# Patient Record
Sex: Male | Born: 1945 | Race: White | Hispanic: No | Marital: Married | Smoking: Former smoker
Health system: Southern US, Community
[De-identification: ages and names within clinical notes are randomized; demographics above are authoritative.]

## PROBLEM LIST (undated history)

## (undated) DIAGNOSIS — K7460 Unspecified cirrhosis of liver: Secondary | ICD-10-CM

## (undated) DIAGNOSIS — I82409 Acute embolism and thrombosis of unspecified deep veins of unspecified lower extremity: Secondary | ICD-10-CM

## (undated) DIAGNOSIS — S065X9A Traumatic subdural hemorrhage with loss of consciousness of unspecified duration, initial encounter: Secondary | ICD-10-CM

## (undated) DIAGNOSIS — I609 Nontraumatic subarachnoid hemorrhage, unspecified: Secondary | ICD-10-CM

## (undated) DIAGNOSIS — I472 Ventricular tachycardia: Secondary | ICD-10-CM

## (undated) DIAGNOSIS — I10 Essential (primary) hypertension: Secondary | ICD-10-CM

## (undated) DIAGNOSIS — Z720 Tobacco use: Secondary | ICD-10-CM

## (undated) DIAGNOSIS — S069X9A Unspecified intracranial injury with loss of consciousness of unspecified duration, initial encounter: Secondary | ICD-10-CM

## (undated) DIAGNOSIS — I739 Peripheral vascular disease, unspecified: Secondary | ICD-10-CM

## (undated) DIAGNOSIS — I872 Venous insufficiency (chronic) (peripheral): Secondary | ICD-10-CM

## (undated) DIAGNOSIS — E119 Type 2 diabetes mellitus without complications: Secondary | ICD-10-CM

## (undated) HISTORY — DX: Essential (primary) hypertension: I10

## (undated) HISTORY — PX: TONSILLECTOMY: SUR1361

## (undated) HISTORY — PX: CYST REMOVAL TRUNK: SHX6283

---

## 2013-12-24 ENCOUNTER — Emergency Department (HOSPITAL_COMMUNITY): Payer: Medicare Other

## 2013-12-24 ENCOUNTER — Encounter (HOSPITAL_COMMUNITY): Payer: Self-pay | Admitting: Emergency Medicine

## 2013-12-24 ENCOUNTER — Inpatient Hospital Stay (HOSPITAL_COMMUNITY): Payer: Medicare Other

## 2013-12-24 ENCOUNTER — Inpatient Hospital Stay (HOSPITAL_COMMUNITY)
Admission: EM | Admit: 2013-12-24 | Discharge: 2014-01-08 | DRG: 082 | Disposition: A | Discharge status: Rehabilitation Facility | Payer: Medicare Other | Attending: Internal Medicine | Admitting: Internal Medicine

## 2013-12-24 DIAGNOSIS — I824Z9 Acute embolism and thrombosis of unspecified deep veins of unspecified distal lower extremity: Secondary | ICD-10-CM | POA: Diagnosis not present

## 2013-12-24 DIAGNOSIS — I10 Essential (primary) hypertension: Secondary | ICD-10-CM | POA: Diagnosis present

## 2013-12-24 DIAGNOSIS — S065XAA Traumatic subdural hemorrhage with loss of consciousness status unknown, initial encounter: Secondary | ICD-10-CM

## 2013-12-24 DIAGNOSIS — E870 Hyperosmolality and hypernatremia: Secondary | ICD-10-CM | POA: Diagnosis not present

## 2013-12-24 DIAGNOSIS — S066X9A Traumatic subarachnoid hemorrhage with loss of consciousness of unspecified duration, initial encounter: Secondary | ICD-10-CM | POA: Diagnosis present

## 2013-12-24 DIAGNOSIS — G9341 Metabolic encephalopathy: Secondary | ICD-10-CM | POA: Diagnosis present

## 2013-12-24 DIAGNOSIS — I609 Nontraumatic subarachnoid hemorrhage, unspecified: Secondary | ICD-10-CM

## 2013-12-24 DIAGNOSIS — I498 Other specified cardiac arrhythmias: Secondary | ICD-10-CM | POA: Diagnosis not present

## 2013-12-24 DIAGNOSIS — L02419 Cutaneous abscess of limb, unspecified: Secondary | ICD-10-CM | POA: Diagnosis not present

## 2013-12-24 DIAGNOSIS — W108XXA Fall (on) (from) other stairs and steps, initial encounter: Secondary | ICD-10-CM | POA: Diagnosis present

## 2013-12-24 DIAGNOSIS — B961 Klebsiella pneumoniae [K. pneumoniae] as the cause of diseases classified elsewhere: Secondary | ICD-10-CM | POA: Diagnosis not present

## 2013-12-24 DIAGNOSIS — S069XAA Unspecified intracranial injury with loss of consciousness status unknown, initial encounter: Secondary | ICD-10-CM

## 2013-12-24 DIAGNOSIS — R1310 Dysphagia, unspecified: Secondary | ICD-10-CM | POA: Diagnosis present

## 2013-12-24 DIAGNOSIS — Z6841 Body Mass Index (BMI) 40.0 and over, adult: Secondary | ICD-10-CM

## 2013-12-24 DIAGNOSIS — J9601 Acute respiratory failure with hypoxia: Secondary | ICD-10-CM | POA: Diagnosis not present

## 2013-12-24 DIAGNOSIS — I739 Peripheral vascular disease, unspecified: Secondary | ICD-10-CM | POA: Diagnosis present

## 2013-12-24 DIAGNOSIS — R404 Transient alteration of awareness: Secondary | ICD-10-CM | POA: Diagnosis present

## 2013-12-24 DIAGNOSIS — R4182 Altered mental status, unspecified: Secondary | ICD-10-CM

## 2013-12-24 DIAGNOSIS — S066XAA Traumatic subarachnoid hemorrhage with loss of consciousness status unknown, initial encounter: Secondary | ICD-10-CM

## 2013-12-24 DIAGNOSIS — Z781 Physical restraint status: Secondary | ICD-10-CM | POA: Diagnosis present

## 2013-12-24 DIAGNOSIS — J9602 Acute respiratory failure with hypercapnia: Secondary | ICD-10-CM

## 2013-12-24 DIAGNOSIS — L03119 Cellulitis of unspecified part of limb: Secondary | ICD-10-CM

## 2013-12-24 DIAGNOSIS — R0689 Other abnormalities of breathing: Secondary | ICD-10-CM | POA: Diagnosis present

## 2013-12-24 DIAGNOSIS — S069X9A Unspecified intracranial injury with loss of consciousness of unspecified duration, initial encounter: Secondary | ICD-10-CM

## 2013-12-24 DIAGNOSIS — G9340 Encephalopathy, unspecified: Secondary | ICD-10-CM

## 2013-12-24 DIAGNOSIS — I872 Venous insufficiency (chronic) (peripheral): Secondary | ICD-10-CM | POA: Diagnosis present

## 2013-12-24 DIAGNOSIS — S065X9A Traumatic subdural hemorrhage with loss of consciousness of unspecified duration, initial encounter: Principal | ICD-10-CM | POA: Diagnosis present

## 2013-12-24 DIAGNOSIS — G4730 Sleep apnea, unspecified: Secondary | ICD-10-CM | POA: Diagnosis present

## 2013-12-24 DIAGNOSIS — E876 Hypokalemia: Secondary | ICD-10-CM | POA: Diagnosis present

## 2013-12-24 DIAGNOSIS — N390 Urinary tract infection, site not specified: Secondary | ICD-10-CM | POA: Diagnosis not present

## 2013-12-24 DIAGNOSIS — Z9981 Dependence on supplemental oxygen: Secondary | ICD-10-CM

## 2013-12-24 DIAGNOSIS — Z87891 Personal history of nicotine dependence: Secondary | ICD-10-CM

## 2013-12-24 DIAGNOSIS — D696 Thrombocytopenia, unspecified: Secondary | ICD-10-CM | POA: Diagnosis not present

## 2013-12-24 DIAGNOSIS — J690 Pneumonitis due to inhalation of food and vomit: Secondary | ICD-10-CM | POA: Diagnosis not present

## 2013-12-24 DIAGNOSIS — I6200 Nontraumatic subdural hemorrhage, unspecified: Secondary | ICD-10-CM

## 2013-12-24 DIAGNOSIS — E662 Morbid (severe) obesity with alveolar hypoventilation: Secondary | ICD-10-CM | POA: Diagnosis present

## 2013-12-24 DIAGNOSIS — R569 Unspecified convulsions: Secondary | ICD-10-CM | POA: Diagnosis not present

## 2013-12-24 DIAGNOSIS — I82409 Acute embolism and thrombosis of unspecified deep veins of unspecified lower extremity: Secondary | ICD-10-CM

## 2013-12-24 DIAGNOSIS — Z8673 Personal history of transient ischemic attack (TIA), and cerebral infarction without residual deficits: Secondary | ICD-10-CM

## 2013-12-24 DIAGNOSIS — J9600 Acute respiratory failure, unspecified whether with hypoxia or hypercapnia: Secondary | ICD-10-CM

## 2013-12-24 DIAGNOSIS — R7309 Other abnormal glucose: Secondary | ICD-10-CM | POA: Diagnosis present

## 2013-12-24 HISTORY — DX: Peripheral vascular disease, unspecified: I73.9

## 2013-12-24 HISTORY — DX: Tobacco use: Z72.0

## 2013-12-24 HISTORY — DX: Nontraumatic subarachnoid hemorrhage, unspecified: I60.9

## 2013-12-24 LAB — I-STAT ARTERIAL BLOOD GAS, ED
ACID-BASE EXCESS: 3 mmol/L — AB (ref 0.0–2.0)
Bicarbonate: 29.2 mEq/L — ABNORMAL HIGH (ref 20.0–24.0)
O2 Saturation: 95 %
PCO2 ART: 49.9 mmHg — AB (ref 35.0–45.0)
PO2 ART: 80 mmHg (ref 80.0–100.0)
Patient temperature: 98.7
TCO2: 31 mmol/L (ref 0–100)
pH, Arterial: 7.376 (ref 7.350–7.450)

## 2013-12-24 LAB — BASIC METABOLIC PANEL
BUN: 7 mg/dL (ref 6–23)
CO2: 26 mEq/L (ref 19–32)
Calcium: 8.8 mg/dL (ref 8.4–10.5)
Chloride: 95 mEq/L — ABNORMAL LOW (ref 96–112)
Creatinine, Ser: 0.71 mg/dL (ref 0.50–1.35)
GFR calc non Af Amer: >90 mL/min (ref >90)
Glucose, Bld: 162 mg/dL — ABNORMAL HIGH (ref 70–99)
POTASSIUM: 4 meq/L (ref 3.7–5.3)
SODIUM: 138 meq/L (ref 137–147)

## 2013-12-24 LAB — CBC WITH DIFFERENTIAL/PLATELET
BASOS PCT: 0 % (ref 0–1)
Basophils Absolute: 0 10*3/uL (ref 0.0–0.1)
Eosinophils Absolute: 0.3 10*3/uL (ref 0.0–0.7)
Eosinophils Relative: 3 % (ref 0–5)
HCT: 48.3 % (ref 39.0–52.0)
Hemoglobin: 16.5 g/dL (ref 13.0–17.0)
Lymphocytes Relative: 23 % (ref 12–46)
Lymphs Abs: 2.1 10*3/uL (ref 0.7–4.0)
MCH: 33.9 pg (ref 26.0–34.0)
MCHC: 34.2 g/dL (ref 30.0–36.0)
MCV: 99.2 fL (ref 78.0–100.0)
Monocytes Absolute: 0.6 10*3/uL (ref 0.1–1.0)
Monocytes Relative: 6 % (ref 3–12)
NEUTROS PCT: 68 % (ref 43–77)
Neutro Abs: 6.1 10*3/uL (ref 1.7–7.7)
PLATELETS: 159 10*3/uL (ref 150–400)
RBC: 4.87 MIL/uL (ref 4.22–5.81)
RDW: 13.1 % (ref 11.5–15.5)
WBC: 9 10*3/uL (ref 4.0–10.5)

## 2013-12-24 LAB — URINALYSIS, ROUTINE W REFLEX MICROSCOPIC
BILIRUBIN URINE: NEGATIVE
Glucose, UA: NEGATIVE mg/dL
Hgb urine dipstick: NEGATIVE
Ketones, ur: NEGATIVE mg/dL
LEUKOCYTES UA: NEGATIVE
Nitrite: NEGATIVE
Protein, ur: NEGATIVE mg/dL
SPECIFIC GRAVITY, URINE: 1.015 (ref 1.005–1.030)
UROBILINOGEN UA: 0.2 mg/dL (ref 0.0–1.0)
pH: 5 (ref 5.0–8.0)

## 2013-12-24 LAB — SALICYLATE LEVEL

## 2013-12-24 LAB — ETHANOL

## 2013-12-24 LAB — RAPID URINE DRUG SCREEN, HOSP PERFORMED
AMPHETAMINES: NOT DETECTED
Barbiturates: NOT DETECTED
Benzodiazepines: NOT DETECTED
Cocaine: NOT DETECTED
Opiates: NOT DETECTED
Tetrahydrocannabinol: NOT DETECTED

## 2013-12-24 LAB — MRSA PCR SCREENING: MRSA by PCR: NEGATIVE

## 2013-12-24 LAB — PROTIME-INR
INR: 1.1 (ref 0.00–1.49)
Prothrombin Time: 14 seconds (ref 11.6–15.2)

## 2013-12-24 LAB — ACETAMINOPHEN LEVEL

## 2013-12-24 LAB — APTT: aPTT: 28 seconds (ref 24–37)

## 2013-12-24 LAB — AMMONIA: Ammonia: 41 umol/L (ref 11–60)

## 2013-12-24 MED ORDER — HYDRALAZINE HCL 20 MG/ML IJ SOLN: 10.0000 mg | Freq: Four times a day (QID) | INTRAMUSCULAR | Status: DC | PRN

## 2013-12-24 MED ORDER — LORAZEPAM 2 MG/ML IJ SOLN
1.0000 mg | Freq: Once | INTRAMUSCULAR | Status: AC
  Administered 2013-12-24: 1 mg via INTRAVENOUS
  Filled 2013-12-24: qty 1

## 2013-12-24 MED ORDER — ONDANSETRON HCL 4 MG PO TABS: 4.0000 mg | ORAL_TABLET | Freq: Four times a day (QID) | ORAL | Status: DC | PRN

## 2013-12-24 MED ORDER — ONDANSETRON HCL 4 MG/2ML IJ SOLN
INTRAMUSCULAR | Status: AC
  Administered 2013-12-24: 4 mg via INTRAVENOUS
  Filled 2013-12-24: qty 2

## 2013-12-24 MED ORDER — ACETAMINOPHEN 650 MG RE SUPP
650.0000 mg | Freq: Four times a day (QID) | RECTAL | Status: DC | PRN
  Administered 2013-12-25: 650 mg via RECTAL
  Filled 2013-12-24: qty 1

## 2013-12-24 MED ORDER — ACETAMINOPHEN 325 MG PO TABS
650.0000 mg | ORAL_TABLET | Freq: Four times a day (QID) | ORAL | Status: DC | PRN
Start: 2013-12-24 — End: 2014-01-05

## 2013-12-24 MED ORDER — SODIUM CHLORIDE 0.9 % IV SOLN
INTRAVENOUS | Status: DC
  Administered 2013-12-24: 50 mL/h via INTRAVENOUS
  Administered 2013-12-25 – 2013-12-29 (×4): via INTRAVENOUS

## 2013-12-24 MED ORDER — HYDRALAZINE HCL 20 MG/ML IJ SOLN
10.0000 mg | Freq: Four times a day (QID) | INTRAMUSCULAR | Status: DC | PRN
  Administered 2013-12-28 – 2013-12-30 (×4): 10 mg via INTRAVENOUS
  Filled 2013-12-24 (×6): qty 1

## 2013-12-24 MED ORDER — TETANUS-DIPHTH-ACELL PERTUSSIS 5-2.5-18.5 LF-MCG/0.5 IM SUSP
0.5000 mL | Freq: Once | INTRAMUSCULAR | Status: AC
  Administered 2013-12-24: 0.5 mL via INTRAMUSCULAR
  Filled 2013-12-24: qty 0.5

## 2013-12-24 MED ORDER — ONDANSETRON HCL 4 MG/2ML IJ SOLN
4.0000 mg | Freq: Once | INTRAMUSCULAR | Status: AC
  Administered 2013-12-24: 4 mg via INTRAVENOUS

## 2013-12-24 MED ORDER — LORAZEPAM 2 MG/ML IJ SOLN: 1.0000 mg | Freq: Once | INTRAMUSCULAR | Status: DC

## 2013-12-24 MED ORDER — ONDANSETRON HCL 4 MG/2ML IJ SOLN: 4.0000 mg | Freq: Four times a day (QID) | INTRAMUSCULAR | Status: DC | PRN

## 2013-12-24 MED ORDER — PANTOPRAZOLE SODIUM 40 MG IV SOLR
40.0000 mg | Freq: Two times a day (BID) | INTRAVENOUS | Status: DC
  Administered 2013-12-24 – 2013-12-31 (×16): 40 mg via INTRAVENOUS
  Filled 2013-12-24 (×26): qty 40

## 2013-12-24 MED ORDER — THIAMINE HCL 100 MG/ML IJ SOLN: 100.0000 mg | Freq: Once | INTRAMUSCULAR | Status: DC

## 2013-12-24 MED ORDER — LORAZEPAM 2 MG/ML IJ SOLN
0.5000 mg | Freq: Four times a day (QID) | INTRAMUSCULAR | Status: DC | PRN
  Administered 2013-12-24 – 2013-12-31 (×5): 0.5 mg via INTRAVENOUS
  Filled 2013-12-24 (×6): qty 1

## 2013-12-24 MED ORDER — LORAZEPAM 2 MG/ML IJ SOLN
0.5000 mg | Freq: Four times a day (QID) | INTRAMUSCULAR | Status: DC | PRN
  Administered 2013-12-24: 0.5 mg via INTRAVENOUS
  Filled 2013-12-24: qty 1

## 2013-12-24 NOTE — Consult Note (Signed)
NEURO HOSPITALIST CONSULT NOTE    Reason for Consult: confusion after fall with SAH  HPI:                                                                                                                                          Maurice Paul is an 68 y.o. male who tends to sleep walk at night. This AM around 2 AM wife heard a large noise and found husband at bottom of stairs.  He fell ~8-12 stairs.  Patient was unconscious after fall. He was brought to [facility] hospital and CT head confirmed SAH and subdural. Currently he is moving all extremities, agitated, able to follow verbal commands with stable BP 113/80.  While in ED he did have emesis but currently is having no issues.   Past Medical History  Diagnosis Date  . PVD (peripheral vascular disease)     History reviewed. No pertinent past surgical history.  Family History: Not known by wife and patient unable to provide.  Social History:  has no tobacco, alcohol, and drug history on file.  No Known Allergies  MEDICATIONS:                                                                                                                     Current Facility-Administered Medications  Medication Dose Route Frequency Provider Last Rate Last Dose  . hydrALAZINE (APRESOLINE) injection 10 mg  10 mg Intravenous Q6H PRN Belkys A Regalado, MD      . LORazepam (ATIVAN) injection 1 mg  1 mg Intravenous Once Belkys A Regalado, MD      . pantoprazole (PROTONIX) injection 40 mg  40 mg Intravenous Q12H Belkys A Regalado, MD   40 mg at 12/24/13 0815   No current outpatient prescriptions on file.      ROS:  History obtained from unobtainable from patient due to confusion  General ROS: negative for - chills, fatigue, fever, night sweats, weight gain or weight loss Psychological ROS: negative for -  behavioral disorder, hallucinations, memory difficulties, mood swings or suicidal ideation Ophthalmic ROS: negative for - blurry vision, double vision, eye pain or loss of vision ENT ROS: negative for - epistaxis, nasal discharge, oral lesions, sore throat, tinnitus or vertigo Allergy and Immunology ROS: negative for - hives or itchy/watery eyes Hematological and Lymphatic ROS: negative for - bleeding problems, bruising or swollen lymph nodes Endocrine ROS: negative for - galactorrhea, hair pattern changes, polydipsia/polyuria or temperature intolerance Respiratory ROS: negative for - cough, hemoptysis, shortness of breath or wheezing Cardiovascular ROS: negative for - chest pain, dyspnea on exertion, edema or irregular heartbeat Gastrointestinal ROS: negative for - abdominal pain, diarrhea, hematemesis, nausea/vomiting or stool incontinence Genito-Urinary ROS: negative for - dysuria, hematuria, incontinence or urinary frequency/urgency Musculoskeletal ROS: negative for - joint swelling or muscular weakness Neurological ROS: as noted in HPI Dermatological ROS: negative for rash and skin lesion changes   Blood pressure 104/67, pulse 95, resp. rate 21, SpO2 99.00%.   Neurologic Examination:                                                                                                       Mental Status: Alert, agitated and confused.  Speech fluent without evidence of aphasia.  Able to follow simple commands without difficulty. Cranial Nerves: II: Discs flat bilaterally; Visual fields grossly normal, pupils 3mm  equal, round, reactive to light and accommodation III,IV, VI: ptosis not present, extra-ocular motions intact bilaterally V,VII: smile symmetric, facial light touch sensation normal bilaterally VIII: hearing normal bilaterally IX,X: gag reflex present XI: bilateral shoulder shrug XII: midline tongue extension without atrophy or fasciculations  Motor: Right : Upper extremity    5/5    Left:     Upper extremity   5/5  Lower extremity   5/5     Lower extremity   5/5 Tone and bulk:normal tone throughout; no atrophy noted Sensory: Pinprick and light touch intact throughout, bilaterally Deep Tendon Reflexes:  Right: Upper Extremity   Left: Upper extremity   biceps (C-5 to C-6) 1/4   biceps (C-5 to C-6) 1/4 tricep (C7) 1/4    triceps (C7) 1/4 Brachioradialis (C6) 1/4  Brachioradialis (C6) 1/4  Lower Extremity Lower Extremity  quadriceps (L-2 to L-4) 1/4   quadriceps (L-2 to L-4) 1/4 Achilles (S1) 0/4   Achilles (S1) 0/4  Plantars: Mute bilatearlly Cerebellar: normal finger-to-nose,  Unable to test heel-to-shin due to agitation Gait: not tested, in restraints due to agitation.  CV: pulses palpable throughout   Lab Results: Basic Metabolic Panel:  Recent Labs Lab 12/24/13 0420  NA 138  K 4.0  CL 95*  CO2 26  GLUCOSE 162*  BUN 7  CREATININE 0.71  CALCIUM 8.8    Liver Function Tests: No results found for this basename: AST, ALT, ALKPHOS, BILITOT, PROT, ALBUMIN,  in the last 168 hours No results found for this basename: LIPASE, AMYLASE,  in the last 168 hours  Recent Labs Lab 12/24/13 0414  AMMONIA 41    CBC:  Recent Labs Lab 12/24/13 0420  WBC 9.0  NEUTROABS 6.1  HGB 16.5  HCT 48.3  MCV 99.2  PLT 159    Cardiac Enzymes: No results found for this basename: CKTOTAL, CKMB, CKMBINDEX, TROPONINI,  in the last 168 hours  Lipid Panel: No results found for this basename: CHOL, TRIG, HDL, CHOLHDL, VLDL, LDLCALC,  in the last 168 hours  CBG: No results found for this basename: GLUCAP,  in the last 168 hours  Microbiology: No results found for this or any previous visit.  Coagulation Studies:  Recent Labs  12/24/13 0520  LABPROT 14.0  INR 1.10    Imaging: Ct Head Wo Contrast  12/24/2013   CLINICAL DATA:  Status post fall.  Confusion.  EXAM: CT HEAD WITHOUT CONTRAST  CT CERVICAL SPINE WITHOUT CONTRAST  TECHNIQUE: Multidetector  CT imaging of the head and cervical spine was performed following the standard protocol without intravenous contrast. Multiplanar CT image reconstructions of the cervical spine were also generated.  COMPARISON:  None.  FINDINGS: CT HEAD FINDINGS  A small volume of subdural hemorrhage is seen along the tentorium, more prominent on the right, and falx. A small amount of subarachnoid hemorrhage is seen over the high right parietal convexities. No mass, midline shift or hydrocephalus is identified. There is no pneumocephalus. The calvarium is intact.  CT CERVICAL SPINE FINDINGS  No fracture or malalignment of the cervical spine is identified. Intervertebral disc space height is maintained with some anterior endplate spurring noted. Lung apices are clear.  IMPRESSION: Head CT is positive for a small amount of subarachnoid hemorrhage over the high left parietal convexities. A small amount of subdural hemorrhage is seen along the falx and tentorium. There is no hydrocephalus or midline shift.  No acute finding cervical spine.  Critical Value/emergent results were called by telephone at the time of interpretation on 12/24/2013 at 4:59 AM to Dr. Wayland SalinasJOHN BEDNAR , who verbally acknowledged these results.   Electronically Signed   By: Drusilla Kannerhomas  Dalessio M.D.   On: 12/24/2013 05:01   Ct Cervical Spine Wo Contrast  12/24/2013   CLINICAL DATA:  Status post fall.  Confusion.  EXAM: CT HEAD WITHOUT CONTRAST  CT CERVICAL SPINE WITHOUT CONTRAST  TECHNIQUE: Multidetector CT imaging of the head and cervical spine was performed following the standard protocol without intravenous contrast. Multiplanar CT image reconstructions of the cervical spine were also generated.  COMPARISON:  None.  FINDINGS: CT HEAD FINDINGS  A small volume of subdural hemorrhage is seen along the tentorium, more prominent on the right, and falx. A small amount of subarachnoid hemorrhage is seen over the high right parietal convexities. No mass, midline shift or  hydrocephalus is identified. There is no pneumocephalus. The calvarium is intact.  CT CERVICAL SPINE FINDINGS  No fracture or malalignment of the cervical spine is identified. Intervertebral disc space height is maintained with some anterior endplate spurring noted. Lung apices are clear.  IMPRESSION: Head CT is positive for a small amount of subarachnoid hemorrhage over the high left parietal convexities. A small amount of subdural hemorrhage is seen along the falx and tentorium. There is no hydrocephalus or midline shift.  No acute finding cervical spine.  Critical Value/emergent results were called by telephone at the time of interpretation on 12/24/2013 at 4:59 AM to Dr. Wayland SalinasJOHN BEDNAR , who verbally acknowledged these results.   Electronically Signed  By: Drusilla Kanner M.D.   On: 12/24/2013 05:01   Dg Chest Port 1 View  12/24/2013   CLINICAL DATA:  Status post fall.  EXAM: PORTABLE CHEST - 1 VIEW  COMPARISON:  None.  FINDINGS: Lungs are clear. Heart size is upper normal. No pneumothorax or pleural effusion. No focal bony abnormality.  IMPRESSION: No acute disease.   Electronically Signed   By: Drusilla Kanner M.D.   On: 12/24/2013 05:19       Assessment and plan per attending neurologist  Felicie Morn PA-C [facility] [phone]  12/24/2013, 10:30 AM   I have seen and evaluated the patient. I have reviewed the above note and made appropriate changes.   On my exam, the patient had been sedated and was lethargic. He did not follow commands but did withdraw x 4.   Assessment/Plan:  68 YO male S/P fall down 8-12 stairs with loss of consciousness.  CT confirms  subarachnoid hemorrhage over the high left parietal convexities and small amount of subdural hemorrhage is seen along the falx and tentorium. BP has remained stable in ED with last 104/67.    Recommend: 1) could consider treating Agitation with Ativan or Haldol (lf no QTc prolongation) 2) Hold antiplatelets 3) BP  control with goal SBP <160 4) repeat head CT now given limited neuro exam.     Ritta Slot, MD [facility] [phone]  If 7pm- 7am, please page neurology on call at [phone].

## 2013-12-24 NOTE — ED Notes (Signed)
Patient sleepwalks and fell down 10-12 stairs. Patient is confused, but according to wife, he is not normally. Patient from home. Oriented to person, but not to situation, place or time. EMS denies blood thinners. CBG 118.

## 2013-12-24 NOTE — H&P (Signed)
[facility] History and Physical  Maurice Paul WUJ:[id number] DOB: 1946-02-25 DOA: 12/24/2013  Referring physician: Dr Fonnie Jarvis.  PCP: No primary provider on file.   Chief Complaint: AMS.   HPI: Maurice Paul is a 67 y.o. male with no significant PMH who presents after a fall. History obtain from Wife. Per wife she heard a noise, she then found her husband lying in the stair. He fell  probably from 8 to 10 steps of stair. He was breathing, but unconscious. She report that he has been congested for last few days.  Patient was found to have SAH and Subdural. Patient is very confuse, his speech is clear. He has been agitated. He received 2 doses of Ativan.    Review of Systems:  Unable to obtain due to AMS.   History reviewed. No pertinent past medical history. History reviewed. No pertinent past surgical history. Social History:  has no tobacco, alcohol, and drug history on file.  No Known Allergies    Prior to Admission medications   Not on File   Physical Exam: Filed Vitals:   12/24/13 0700  BP: 151/113  Pulse: 109  Resp: 21    BP 151/113  Pulse 109  Resp 21  SpO2 93%  General: no acute distress, agitated at time.  Eyes: PERRL, normal lids, irises & conjunctiva ENT: grossly normal hearing, lips & tongue Neck: no LAD, masses or thyromegaly Cardiovascular: RRR, no m/r/g. No LE edema. Telemetry: SR, no arrhythmias  Respiratory: CTA bilaterally, no w/r/r. Normal respiratory effort. Abdomen: soft, ntnd Skin: no rash or induration seen on limited exam Musculoskeletal: grossly normal tone BUE/BLE Neurologic: Patient is very confuse, agitated at time. Follow some command, move all 4 extremities.           Labs on Admission:  Basic Metabolic Panel:  Recent Labs Lab 12/24/13 0420  NA 138  K 4.0  CL 95*  CO2 26  GLUCOSE 162*  BUN 7  CREATININE 0.71  CALCIUM 8.8   Liver Function Tests: No results found for this basename: AST, ALT, ALKPHOS, BILITOT, PROT,  ALBUMIN,  in the last 168 hours No results found for this basename: LIPASE, AMYLASE,  in the last 168 hours  Recent Labs Lab 12/24/13 0414  AMMONIA 41   CBC:  Recent Labs Lab 12/24/13 0420  WBC 9.0  NEUTROABS 6.1  HGB 16.5  HCT 48.3  MCV 99.2  PLT 159   Cardiac Enzymes: No results found for this basename: CKTOTAL, CKMB, CKMBINDEX, TROPONINI,  in the last 168 hours  BNP (last 3 results) No results found for this basename: PROBNP,  in the last 8760 hours CBG: No results found for this basename: GLUCAP,  in the last 168 hours  Radiological Exams on Admission: Ct Head Wo Contrast  12/24/2013   CLINICAL DATA:  Status post fall.  Confusion.  EXAM: CT HEAD WITHOUT CONTRAST  CT CERVICAL SPINE WITHOUT CONTRAST  TECHNIQUE: Multidetector CT imaging of the head and cervical spine was performed following the standard protocol without intravenous contrast. Multiplanar CT image reconstructions of the cervical spine were also generated.  COMPARISON:  None.  FINDINGS: CT HEAD FINDINGS  A small volume of subdural hemorrhage is seen along the tentorium, more prominent on the right, and falx. A small amount of subarachnoid hemorrhage is seen over the high right parietal convexities. No mass, midline shift or hydrocephalus is identified. There is no pneumocephalus. The calvarium is intact.  CT CERVICAL SPINE FINDINGS  No fracture or malalignment of the  cervical spine is identified. Intervertebral disc space height is maintained with some anterior endplate spurring noted. Lung apices are clear.  IMPRESSION: Head CT is positive for a small amount of subarachnoid hemorrhage over the high left parietal convexities. A small amount of subdural hemorrhage is seen along the falx and tentorium. There is no hydrocephalus or midline shift.  No acute finding cervical spine.  Critical Value/emergent results were called by telephone at the time of interpretation on 12/24/2013 at 4:59 AM to Dr. Wayland SalinasJOHN BEDNAR , who verbally  acknowledged these results.   Electronically Signed   By: Drusilla Kannerhomas  Dalessio M.D.   On: 12/24/2013 05:01   Ct Cervical Spine Wo Contrast  12/24/2013   CLINICAL DATA:  Status post fall.  Confusion.  EXAM: CT HEAD WITHOUT CONTRAST  CT CERVICAL SPINE WITHOUT CONTRAST  TECHNIQUE: Multidetector CT imaging of the head and cervical spine was performed following the standard protocol without intravenous contrast. Multiplanar CT image reconstructions of the cervical spine were also generated.  COMPARISON:  None.  FINDINGS: CT HEAD FINDINGS  A small volume of subdural hemorrhage is seen along the tentorium, more prominent on the right, and falx. A small amount of subarachnoid hemorrhage is seen over the high right parietal convexities. No mass, midline shift or hydrocephalus is identified. There is no pneumocephalus. The calvarium is intact.  CT CERVICAL SPINE FINDINGS  No fracture or malalignment of the cervical spine is identified. Intervertebral disc space height is maintained with some anterior endplate spurring noted. Lung apices are clear.  IMPRESSION: Head CT is positive for a small amount of subarachnoid hemorrhage over the high left parietal convexities. A small amount of subdural hemorrhage is seen along the falx and tentorium. There is no hydrocephalus or midline shift.  No acute finding cervical spine.  Critical Value/emergent results were called by telephone at the time of interpretation on 12/24/2013 at 4:59 AM to Dr. Wayland SalinasJOHN BEDNAR , who verbally acknowledged these results.   Electronically Signed   By: Drusilla Kannerhomas  Dalessio M.D.   On: 12/24/2013 05:01   Dg Chest Port 1 View  12/24/2013   CLINICAL DATA:  Status post fall.  EXAM: PORTABLE CHEST - 1 VIEW  COMPARISON:  None.  FINDINGS: Lungs are clear. Heart size is upper normal. No pneumothorax or pleural effusion. No focal bony abnormality.  IMPRESSION: No acute disease.   Electronically Signed   By: Drusilla Kannerhomas  Dalessio M.D.   On: 12/24/2013 05:19    EKG:  Independently reviewed. Sinus rhythm.   Assessment/Plan Active Problems:   SAH (subarachnoid hemorrhage)  1-SAH, Subdural Hematoma;  Admit to step down unit. Frequent neuro check.  Avoid blood thinner. SCD for DVT prophylaxis.  I have consulted Neurology. Per ED note neurosurgery was consulted. Waiting call back from Dr Danielle DessElsner.    2-Hypertension; Will order PRN hydralazine for SBP more than 140.   3-Con[facility]; Neurology consulted. Patient received Ativan in the ED. Will defer to neurology further medications for agitation. Salicylate less than 2, tylenol less than 15, alcohol less than 11. Will check ABG.   4-DVT prophylaxis; SCD.   Code Status: Full code.  Family Communication: Care discussed with wife who was at bedside.  Disposition Plan: expect 3 to 4 days inpatient.   Time spent: 75 minutes.   Hartley Barefootegalado, Belkys A [facility] Pager [phone]

## 2013-12-24 NOTE — Progress Notes (Addendum)
Unable to complete health history patient is lethargic and there is no family at bedside

## 2013-12-24 NOTE — ED Notes (Addendum)
Pt back to room A8 from CT. Remains nauseated. [facility] Pt alert, NAD, calm, interactive, holding emesis bag.

## 2013-12-24 NOTE — ED Notes (Addendum)
RN called to CT, upon lying flat pt turned purple, pulled collar off, sat up and vomited, gastric content on front of gown, ~ 25cc, no s/sx of dyspnea or aspiration noted, VS taken. Dr. Fonnie JarvisBednar notified and zofran ordered. Pt "feels better" and continued with scan. Skin cool, pale, dry, mildly mottled, no longer purple

## 2013-12-24 NOTE — ED Provider Notes (Signed)
CSN: [id number]     Arrival date & time 12/24/13  0354 History   First MD Initiated Contact with Patient 12/24/13 0401     Chief Complaint  Patient presents with  . Fall  . Altered Mental Status     (Consider location/radiation/quality/duration/timing/severity/associated sxs/prior Treatment) HPI 68 year old male presents with altered mental status, apparently the patient went to bed with normal mental status without complaints, his wife woke up having heard the patient fall down flight of steps and found the patient with altered mental status, patient is awake and talking but is oriented to person only not to place or time he has amnesia for the events, the patient denies any pain denies headache neck pain back pain chest pain shortness breath abdominal pain denies any change in speech or vision denies focal weakness or numbness or incoordination but may be an unreliable historian due to his new onset altered mental status. Patient apparently has a history of sleepwalking. Past Medical History  Diagnosis Date  . PVD (peripheral vascular disease)    Past Surgical History  Procedure Laterality Date  . Cyst removal trunk     History reviewed. No pertinent family history. History  Substance Use Topics  . Smoking status: Former Smoker    Quit date: 10/09/1998  . Smokeless tobacco: Current User    Types: Chew  . Alcohol Use: Yes    Review of Systems  Unable to perform ROS: Mental status change      Allergies  Review of patient's allergies indicates no known allergies.  Home Medications  No current outpatient prescriptions on file. BP 151/97  Pulse 80  Temp(Src) 99.4 F (37.4 C) (Axillary)  Resp 22  Ht 5\' 10"  (1.778 m)  Wt 298 lb 15.1 oz (135.6 kg)  BMI 42.89 kg/m2  SpO2 99% Physical Exam  Nursing note and vitals reviewed. Constitutional:  Awake, alert, nontoxic appearance with baseline speech for patient. GCS 14  HENT:  Mouth/Throat: No oropharyngeal exudate.   Occipital scalp abrasion without laceration noted  Eyes: EOM are normal. Pupils are equal, round, and reactive to light. Right eye exhibits no discharge. Left eye exhibits no discharge.  Neck: Neck supple.  Cervical spine nontender without palpable step off  Cardiovascular: Normal rate and regular rhythm.   No murmur heard. Pulmonary/Chest: Effort normal and breath sounds normal. No stridor. No respiratory distress. He has no wheezes. He has no rales. He exhibits no tenderness.  Abdominal: Soft. Bowel sounds are normal. He exhibits no mass. There is no tenderness. There is no rebound.  Musculoskeletal: He exhibits no tenderness.  Baseline ROM, moves extremities with no obvious new focal weakness.  Lymphadenopathy:    He has no cervical adenopathy.  Neurological: He is alert.  Awake, alert, confused oriented to person only not to time or to place, intermittently cooperative and unaware of situation; motor strength 5/5 bilaterally; sensation normal to light touch bilaterally; peripheral visual fields full to confrontation; no facial asymmetry; tongue midline; major cranial nerves appear intact; no pronator drift, normal finger to nose bilaterally  Skin: No rash noted.  Psychiatric: He has a normal mood and affect.    ED Course  Procedures (including critical care time) D/w NSurg who will consult, and d/w TSurg who agrees with Med admit. 4098 D/w [facility] for admit. 1191 Pt stable in ED with no significant deterioration in condition.Family / Caregiver informed of clinical course, understand medical decision-making process, and agree with plan. Airway patent and maintained in ED. Labs Review Labs Reviewed  BASIC METABOLIC PANEL - Abnormal; Notable for the following:    Chloride 95 (*)    Glucose, Bld 162 (*)    All other components within normal limits  SALICYLATE LEVEL - Abnormal; Notable for the following:    Salicylate Lvl <2.0 (*)    All other components within normal limits  BASIC  METABOLIC PANEL - Abnormal; Notable for the following:    Glucose, Bld 170 (*)    All other components within normal limits  CBC - Abnormal; Notable for the following:    WBC 12.9 (*)    MCV 102.7 (*)    All other components within normal limits  PRO B NATRIURETIC PEPTIDE - Abnormal; Notable for the following:    Pro B Natriuretic peptide (BNP) 577.4 (*)    All other components within normal limits  BLOOD GAS, ARTERIAL - Abnormal; Notable for the following:    pH, Arterial 7.203 (*)    pCO2 arterial 96.6 (*)    pO2, Arterial 110.0 (*)    Bicarbonate 36.6 (*)    Acid-Base Excess 8.7 (*)    All other components within normal limits  BLOOD GAS, ARTERIAL - Abnormal; Notable for the following:    pCO2 arterial 60.3 (*)    pO2, Arterial 106.0 (*)    Bicarbonate 34.2 (*)    Acid-Base Excess 8.9 (*)    All other components within normal limits  GLUCOSE, CAPILLARY - Abnormal; Notable for the following:    Glucose-Capillary 169 (*)    All other components within normal limits  GLUCOSE, CAPILLARY - Abnormal; Notable for the following:    Glucose-Capillary 155 (*)    All other components within normal limits  CBC - Abnormal; Notable for the following:    WBC 11.6 (*)    MCV 102.5 (*)    All other components within normal limits  BASIC METABOLIC PANEL - Abnormal; Notable for the following:    CO2 33 (*)    Glucose, Bld 145 (*)    GFR calc non Af Amer 87 (*)    All other components within normal limits  PHOSPHORUS - Abnormal; Notable for the following:    Phosphorus 0.5 (*)    All other components within normal limits  BLOOD GAS, ARTERIAL - Abnormal; Notable for the following:    pCO2 arterial 49.1 (*)    pO2, Arterial 65.0 (*)    Bicarbonate 32.8 (*)    Acid-Base Excess 8.6 (*)    All other components within normal limits  GLUCOSE, CAPILLARY - Abnormal; Notable for the following:    Glucose-Capillary 133 (*)    All other components within normal limits  GLUCOSE, CAPILLARY -  Abnormal; Notable for the following:    Glucose-Capillary 155 (*)    All other components within normal limits  GLUCOSE, CAPILLARY - Abnormal; Notable for the following:    Glucose-Capillary 147 (*)    All other components within normal limits  GLUCOSE, CAPILLARY - Abnormal; Notable for the following:    Glucose-Capillary 129 (*)    All other components within normal limits  GLUCOSE, CAPILLARY - Abnormal; Notable for the following:    Glucose-Capillary 173 (*)    All other components within normal limits  I-STAT ARTERIAL BLOOD GAS, ED - Abnormal; Notable for the following:    pCO2 arterial 49.9 (*)    Bicarbonate 29.2 (*)    Acid-Base Excess 3.0 (*)    All other components within normal limits  MRSA PCR SCREENING  CBC WITH DIFFERENTIAL  URINALYSIS, ROUTINE W REFLEX MICROSCOPIC  AMMONIA  ETHANOL  URINE RAPID DRUG SCREEN (HOSP PERFORMED)  ACETAMINOPHEN LEVEL  APTT  PROTIME-INR  TRIGLYCERIDES  MAGNESIUM  I-STAT CG4 LACTIC ACID, ED  I-STAT TROPOININ, ED   Imaging Review Ct Head Wo Contrast  12/25/2013   CLINICAL DATA:  History of intracranial hemorrhage.  EXAM: CT HEAD WITHOUT CONTRAST  TECHNIQUE: Contiguous axial images were obtained from the base of the skull through the vertex without intravenous contrast.  COMPARISON:  Head CT scan 12/24/2013.  FINDINGS: Small amount of subdural blood along the falx is again seen. Largest component is posterior and on the right measuring 0.8 cm, unchanged. A small amount of subarachnoid blood over the left parietal convexities is also unchanged. For also appears to be some blood along the tentorium. There is no new hemorrhage. No intraventricular blood, hydrocephalus or midline shift is identified. The calvarium is intact.  IMPRESSION: No change in a small amount of subdural blood and subarachnoid blood over the left frontal convexities. Negative for hydrocephalus or other new abnormality.   Electronically Signed   By: Drusilla Kanner M.D.   On:  12/25/2013 03:50   Ct Head Wo Contrast  12/24/2013   CLINICAL DATA:  Fall.  EXAM: CT HEAD WITHOUT CONTRAST  TECHNIQUE: Contiguous axial images were obtained from the base of the skull through the vertex without intravenous contrast.  COMPARISON:  CT HEAD W/O CM dated 12/24/2013  FINDINGS: Again noted is small amount of sub arachnoid hemorrhage left upper posterior parietal convexity. Subdural hematoma again noted in the falx and tentorium. The subdural hematoma in the upper vertex along the falx on the left has increased slightly in size. No prominent mass effect. Its diameter is approximately 8 mm. No mass. No hydrocephalus. No acute bony abnormality.  IMPRESSION: 1. Persistent changes of small subarachnoid hemorrhage in the left posterior parietal vertex. 2. Persistent subdural hematoma along the falx and tentorium. Falcine subdural hematoma has increased slightly in size. No prominent mass effect.   Electronically Signed   By: Maisie Fus  Register   On: 12/24/2013 18:33   Dg Chest Port 1 View  12/26/2013   CLINICAL DATA:  Endotracheal tube  EXAM: PORTABLE CHEST - 1 VIEW  COMPARISON:  DG CHEST 1V PORT dated 12/25/2013; DG CHEST 1V PORT dated 12/25/2013; DG CHEST 1V PORT dated 12/24/2013  FINDINGS: Grossly unchanged enlarged cardiac silhouette and mediastinal contours given persistently reduced lung volumes and patient rotation. Pulmonary venous congestion without definite evidence of edema. Grossly unchanged bibasilar heterogeneous opacities, right greater than left. Small / trace bilateral effusions are grossly unchanged. Unchanged bones.  IMPRESSION: 1.  Stable positioning of support apparatus.  No pneumothorax. 2. Mild pulmonary venous congestion without definite evidence of edema. 3. Persistently reduced lung volumes with grossly unchanged bibasilar opacities, atelectasis versus infiltrate.   Electronically Signed   By: Simonne Come M.D.   On: 12/26/2013 07:54   Dg Chest Port 1 View  12/25/2013   CLINICAL  DATA:  Central line placement  EXAM: PORTABLE CHEST - 1 VIEW  COMPARISON:  12/25/2013  FINDINGS: Cardiomegaly again noted. There is probable small right pleural effusion with hazy right basilar atelectasis or infiltrate. Endotracheal tube in place with tip 3.4 cm above the carina. Left IJ central line with tip in SVC. No diagnostic pneumothorax.  IMPRESSION: There is probable small right pleural effusion with hazy right basilar atelectasis or infiltrate. Endotracheal tube in place with tip 3.4 cm above the carina. Left IJ central line with  tip in SVC. No diagnostic pneumothorax.   Electronically Signed   By: Natasha Mead M.D.   On: 12/25/2013 11:37   Dg Chest Port 1 View  12/25/2013   CLINICAL DATA:  Unexplained hypoxia  EXAM: PORTABLE CHEST - 1 VIEW  COMPARISON:  DG CHEST 1V PORT dated 12/24/2013  FINDINGS: The lungs are less well inflated on the current portable semi-erect view. Increased density at the lung bases obscures the hemidiaphragm consistent with atelectasis and likely small amounts of pleural fluid. The pulmonary interstitial markings are mildly increased. The cardiopericardial silhouette remains enlarged. The trachea is midline. The observed portions of the bony thorax exhibit no acute abnormalities.  IMPRESSION: There is bilateral pulmonary hypoinflation which accentuates the pulmonary interstitial markings. Density at the lung bases is consistent with atelectasis or small effusions. There may be low-grade CHF present. When the patient can tolerate the procedure an upright PA and lateral chest x-ray would be of value.   Electronically Signed   By: David  [place]   On: 12/25/2013 07:09     EKG Interpretation   Date/Time:  Wednesday December 24 2013 04:22:26 EDT Ventricular Rate:  94 PR Interval:  145 QRS Duration: 115 QT Interval:  368 QTC Calculation: 460 R Axis:   52 Text Interpretation:  Sinus rhythm Ventricular premature complex  Nonspecific intraventricular conduction delay Inferior  infarct, old No  previous ECGs available Confirmed by [facility]  MD, Jonny Ruiz ([id number]) on 12/24/2013  5:48:42 AM      MDM   Final diagnoses:  Closed TBI (traumatic brain injury)  Subarachnoid hemorrhage following injury  Subdural hematoma, post-traumatic    The patient appears reasonably stabilized for admission considering the current resources, flow, and capabilities available in the ED at this time, and I doubt any other [facility] requiring further screening and/or treatment in the ED prior to admission.    Hurman Horn, MD 12/26/13 1325

## 2013-12-24 NOTE — ED Notes (Addendum)
Pt alert, restless, aggitated, unable to tolerate BP cuff, continues to ask repetitive questions, interactive, MAEx4, speech clear. Not remembering recent events (PTA or in CT).

## 2013-12-24 NOTE — Consult Note (Signed)
Reason for Consult:Closed head injury Referring Physician: [name] is an 68 y.o. male.  HPI: 68 yo male fell down stairs this am about 2. CT shows traumatic SAH about tentorium and interhemispheric fissure. With small contusions over convexity.  History reviewed. No pertinent past medical history.  History reviewed. No pertinent past surgical history.  No family history on file.  Social History:  has no tobacco, alcohol, and drug history on file.  Allergies: No Known Allergies  Medications: I have reviewed the patient's current medications.  Results for orders placed during the hospital encounter of 12/24/13 (from the past 48 hour(s))  AMMONIA     Status: None   Collection Time    12/24/13  4:14 AM      Result Value Ref Range   Ammonia 41  11 - 60 umol/L  CBC WITH DIFFERENTIAL     Status: None   Collection Time    12/24/13  4:20 AM      Result Value Ref Range   WBC 9.0  4.0 - 10.5 K/uL   RBC 4.87  4.22 - 5.81 MIL/uL   Hemoglobin 16.5  13.0 - 17.0 g/dL   HCT 48.3  39.0 - 52.0 %   MCV 99.2  78.0 - 100.0 fL   MCH 33.9  26.0 - 34.0 pg   MCHC 34.2  30.0 - 36.0 g/dL   RDW 13.1  11.5 - 15.5 %   Platelets 159  150 - 400 K/uL   Neutrophils Relative % 68  43 - 77 %   Neutro Abs 6.1  1.7 - 7.7 K/uL   Lymphocytes Relative 23  12 - 46 %   Lymphs Abs 2.1  0.7 - 4.0 K/uL   Monocytes Relative 6  3 - 12 %   Monocytes Absolute 0.6  0.1 - 1.0 K/uL   Eosinophils Relative 3  0 - 5 %   Eosinophils Absolute 0.3  0.0 - 0.7 K/uL   Basophils Relative 0  0 - 1 %   Basophils Absolute 0.0  0.0 - 0.1 K/uL  BASIC METABOLIC PANEL     Status: Abnormal   Collection Time    12/24/13  4:20 AM      Result Value Ref Range   Sodium 138  137 - 147 mEq/L   Potassium 4.0  3.7 - 5.3 mEq/L   Comment: HEMOLYSIS AT THIS LEVEL MAY AFFECT RESULT   Chloride 95 (*) 96 - 112 mEq/L   CO2 26  19 - 32 mEq/L   Glucose, Bld 162 (*) 70 - 99 mg/dL   BUN 7  6 - 23 mg/dL   Creatinine, Ser 0.71  0.50 -  1.35 mg/dL   Calcium 8.8  8.4 - 10.5 mg/dL   GFR calc non Af Amer >90  >90 mL/min   GFR calc Af Amer >90  >90 mL/min   Comment: (NOTE)     The eGFR has been calculated using the CKD EPI equation.     This calculation has not been validated in all clinical situations.     eGFR's persistently <90 mL/min signify possible Chronic Kidney     Disease.  Maurice Paul     Status: None   Collection Time    12/24/13  4:20 AM      Result Value Ref Range   Alcohol, Ethyl (B) <11  0 - 11 mg/dL   Comment:            LOWEST DETECTABLE LIMIT FOR  SERUM ALCOHOL IS 11 mg/dL     FOR MEDICAL PURPOSES ONLY  ACETAMINOPHEN LEVEL     Status: None   Collection Time    12/24/13  4:20 AM      Result Value Ref Range   Acetaminophen (Tylenol), Serum <15.0  10 - 30 ug/mL   Comment:            THERAPEUTIC CONCENTRATIONS VARY     SIGNIFICANTLY. A RANGE OF 10-30     ug/mL MAY BE AN EFFECTIVE     CONCENTRATION FOR MANY PATIENTS.     HOWEVER, SOME ARE BEST TREATED     AT CONCENTRATIONS OUTSIDE THIS     RANGE.     ACETAMINOPHEN CONCENTRATIONS     >150 ug/mL AT 4 HOURS AFTER     INGESTION AND >50 ug/mL AT 12     HOURS AFTER INGESTION ARE     OFTEN ASSOCIATED WITH TOXIC     REACTIONS.  SALICYLATE LEVEL     Status: Abnormal   Collection Time    12/24/13  4:20 AM      Result Value Ref Range   Salicylate Lvl <2.4 (*) 2.8 - 20.0 mg/dL  APTT     Status: None   Collection Time    12/24/13  5:20 AM      Result Value Ref Range   aPTT 28  24 - 37 seconds  PROTIME-INR     Status: None   Collection Time    12/24/13  5:20 AM      Result Value Ref Range   Prothrombin Time 14.0  11.6 - 15.2 seconds   INR 1.10  0.00 - 1.49  I-STAT ARTERIAL BLOOD GAS, ED     Status: Abnormal   Collection Time    12/24/13  9:51 AM      Result Value Ref Range   pH, Arterial 7.376  7.350 - 7.450   pCO2 arterial 49.9 (*) 35.0 - 45.0 mmHg   pO2, Arterial 80.0  80.0 - 100.0 mmHg   Bicarbonate 29.2 (*) 20.0 - 24.0 mEq/L   TCO2 31  0 -  100 mmol/L   O2 Saturation 95.0     Acid-Base Excess 3.0 (*) 0.0 - 2.0 mmol/L   Patient temperature 98.7 F     Collection site RADIAL, ALLEN'S TEST ACCEPTABLE     Drawn by Operator     Sample type ARTERIAL      Ct Head Wo Contrast  12/24/2013   CLINICAL DATA:  Status post fall.  Confusion.  EXAM: CT HEAD WITHOUT CONTRAST  CT CERVICAL SPINE WITHOUT CONTRAST  TECHNIQUE: Multidetector CT imaging of the head and cervical spine was performed following the standard protocol without intravenous contrast. Multiplanar CT image reconstructions of the cervical spine were also generated.  COMPARISON:  None.  FINDINGS: CT HEAD FINDINGS  A small volume of subdural hemorrhage is seen along the tentorium, more prominent on the right, and falx. A small amount of subarachnoid hemorrhage is seen over the high right parietal convexities. No mass, midline shift or hydrocephalus is identified. There is no pneumocephalus. The calvarium is intact.  CT CERVICAL SPINE FINDINGS  No fracture or malalignment of the cervical spine is identified. Intervertebral disc space height is maintained with some anterior endplate spurring noted. Lung apices are clear.  IMPRESSION: Head CT is positive for a small amount of subarachnoid hemorrhage over the high left parietal convexities. A small amount of subdural hemorrhage is seen along the falx and tentorium.  There is no hydrocephalus or midline shift.  No acute finding cervical spine.  Critical Value/emergent results were called by telephone at the time of interpretation on 12/24/2013 at 4:59 AM to Dr. Riki Altes , who verbally acknowledged these results.   Electronically Signed   By: Inge Rise M.D.   On: 12/24/2013 05:01   Ct Cervical Spine Wo Contrast  12/24/2013   CLINICAL DATA:  Status post fall.  Confusion.  EXAM: CT HEAD WITHOUT CONTRAST  CT CERVICAL SPINE WITHOUT CONTRAST  TECHNIQUE: Multidetector CT imaging of the head and cervical spine was performed following the standard  protocol without intravenous contrast. Multiplanar CT image reconstructions of the cervical spine were also generated.  COMPARISON:  None.  FINDINGS: CT HEAD FINDINGS  A small volume of subdural hemorrhage is seen along the tentorium, more prominent on the right, and falx. A small amount of subarachnoid hemorrhage is seen over the high right parietal convexities. No mass, midline shift or hydrocephalus is identified. There is no pneumocephalus. The calvarium is intact.  CT CERVICAL SPINE FINDINGS  No fracture or malalignment of the cervical spine is identified. Intervertebral disc space height is maintained with some anterior endplate spurring noted. Lung apices are clear.  IMPRESSION: Head CT is positive for a small amount of subarachnoid hemorrhage over the high left parietal convexities. A small amount of subdural hemorrhage is seen along the falx and tentorium. There is no hydrocephalus or midline shift.  No acute finding cervical spine.  Critical Value/emergent results were called by telephone at the time of interpretation on 12/24/2013 at 4:59 AM to Dr. Riki Altes , who verbally acknowledged these results.   Electronically Signed   By: Inge Rise M.D.   On: 12/24/2013 05:01   Dg Chest Port 1 View  12/24/2013   CLINICAL DATA:  Status post fall.  EXAM: PORTABLE CHEST - 1 VIEW  COMPARISON:  None.  FINDINGS: Lungs are clear. Heart size is upper normal. No pneumothorax or pleural effusion. No focal bony abnormality.  IMPRESSION: No acute disease.   Electronically Signed   By: Inge Rise M.D.   On: 12/24/2013 05:19    Review of Systems  Unable to perform ROS: mental acuity   Blood pressure 104/67, pulse 95, resp. rate 21, SpO2 99.00%. Physical Exam  Constitutional: He appears well-developed.  obese  HENT:  Head: Normocephalic.  Small scalp contusions on vertex near occiput.  Eyes: Conjunctivae and EOM are normal. Pupils are equal, round, and reactive to light.  Neck: Normal range of  motion. Neck supple.  Neurological:  Opens eyes to pain, moves all four extremities. Does not consistently follow commands.    Assessment/Plan: CHI  GCS12. Plan observe with repeat ct in am.  Maurice Paul J 12/24/2013, 9:54 AM

## 2013-12-25 ENCOUNTER — Inpatient Hospital Stay (HOSPITAL_COMMUNITY): Payer: Medicare Other

## 2013-12-25 DIAGNOSIS — I609 Nontraumatic subarachnoid hemorrhage, unspecified: Secondary | ICD-10-CM

## 2013-12-25 DIAGNOSIS — J9602 Acute respiratory failure with hypercapnia: Secondary | ICD-10-CM

## 2013-12-25 DIAGNOSIS — R0689 Other abnormalities of breathing: Secondary | ICD-10-CM | POA: Diagnosis present

## 2013-12-25 DIAGNOSIS — J9601 Acute respiratory failure with hypoxia: Secondary | ICD-10-CM | POA: Diagnosis not present

## 2013-12-25 DIAGNOSIS — G9341 Metabolic encephalopathy: Secondary | ICD-10-CM | POA: Diagnosis present

## 2013-12-25 LAB — BASIC METABOLIC PANEL
BUN: 6 mg/dL (ref 6–23)
CO2: 31 meq/L (ref 19–32)
Calcium: 8.5 mg/dL (ref 8.4–10.5)
Chloride: 98 mEq/L (ref 96–112)
Creatinine, Ser: 0.71 mg/dL (ref 0.50–1.35)
GFR calc Af Amer: >90 mL/min (ref >90)
GFR calc non Af Amer: >90 mL/min (ref >90)
Glucose, Bld: 170 mg/dL — ABNORMAL HIGH (ref 70–99)
Potassium: 4.6 mEq/L (ref 3.7–5.3)
SODIUM: 140 meq/L (ref 137–147)

## 2013-12-25 LAB — BLOOD GAS, ARTERIAL
ACID-BASE EXCESS: 8.9 mmol/L — AB (ref 0.0–2.0)
Acid-Base Excess: 8.7 mmol/L — ABNORMAL HIGH (ref 0.0–2.0)
BICARBONATE: 36.6 meq/L — AB (ref 20.0–24.0)
Bicarbonate: 34.2 mEq/L — ABNORMAL HIGH (ref 20.0–24.0)
DRAWN BY: 36527
Drawn by: 31996
FIO2: 100 %
FIO2: 1 %
LHR: 18 {breaths}/min
O2 SAT: 98.7 %
O2 Saturation: 97.9 %
PCO2 ART: 96.6 mmHg — AB (ref 35.0–45.0)
PCO2 ART: 60.3 mmHg — AB (ref 35.0–45.0)
PEEP: 5 cmH2O
PH ART: 7.203 — AB (ref 7.350–7.450)
Patient temperature: 98.6
Patient temperature: 98.6
TCO2: 39.5 mmol/L (ref 0–100)
TCO2: 36 mmol/L (ref 0–100)
VT: 600 mL
pH, Arterial: 7.372 (ref 7.350–7.450)
pO2, Arterial: 110 mmHg — ABNORMAL HIGH (ref 80.0–100.0)
pO2, Arterial: 106 mmHg — ABNORMAL HIGH (ref 80.0–100.0)

## 2013-12-25 LAB — CBC
HEMATOCRIT: 49.7 % (ref 39.0–52.0)
HEMOGLOBIN: 16.3 g/dL (ref 13.0–17.0)
MCH: 33.7 pg (ref 26.0–34.0)
MCHC: 32.8 g/dL (ref 30.0–36.0)
MCV: 102.7 fL — ABNORMAL HIGH (ref 78.0–100.0)
Platelets: 179 10*3/uL (ref 150–400)
RBC: 4.84 MIL/uL (ref 4.22–5.81)
RDW: 13.9 % (ref 11.5–15.5)
WBC: 12.9 10*3/uL — ABNORMAL HIGH (ref 4.0–10.5)

## 2013-12-25 LAB — GLUCOSE, CAPILLARY
GLUCOSE-CAPILLARY: 133 mg/dL — AB (ref 70–99)
Glucose-Capillary: 169 mg/dL — ABNORMAL HIGH (ref 70–99)
Glucose-Capillary: 155 mg/dL — ABNORMAL HIGH (ref 70–99)
Glucose-Capillary: 155 mg/dL — ABNORMAL HIGH (ref 70–99)

## 2013-12-25 LAB — PRO B NATRIURETIC PEPTIDE: Pro B Natriuretic peptide (BNP): 577.4 pg/mL — ABNORMAL HIGH (ref 0–125)

## 2013-12-25 LAB — TRIGLYCERIDES: Triglycerides: 102 mg/dL (ref <150)

## 2013-12-25 MED ORDER — MIDAZOLAM HCL 2 MG/2ML IJ SOLN
2.0000 mg | Freq: Once | INTRAMUSCULAR | Status: AC
  Administered 2013-12-25: 2 mg via INTRAVENOUS

## 2013-12-25 MED ORDER — FUROSEMIDE 10 MG/ML IJ SOLN
40.0000 mg | Freq: Three times a day (TID) | INTRAMUSCULAR | Status: AC
  Administered 2013-12-25 (×2): 40 mg via INTRAVENOUS
  Filled 2013-12-25 (×2): qty 4

## 2013-12-25 MED ORDER — MIDAZOLAM HCL 2 MG/2ML IJ SOLN
1.0000 mg | INTRAMUSCULAR | Status: DC | PRN
  Administered 2013-12-25: 2 mg via INTRAVENOUS
  Filled 2013-12-25: qty 2

## 2013-12-25 MED ORDER — FENTANYL CITRATE 0.05 MG/ML IJ SOLN
100.0000 ug | Freq: Once | INTRAMUSCULAR | Status: AC
  Administered 2013-12-25: 100 ug via INTRAVENOUS

## 2013-12-25 MED ORDER — MIDAZOLAM HCL 2 MG/2ML IJ SOLN
INTRAMUSCULAR | Status: AC
  Filled 2013-12-25: qty 4

## 2013-12-25 MED ORDER — DEXMEDETOMIDINE HCL IN NACL 200 MCG/50ML IV SOLN
0.4000 ug/kg/h | INTRAVENOUS | Status: DC
  Administered 2013-12-25 (×2): 0.4 ug/kg/h via INTRAVENOUS
  Filled 2013-12-25: qty 50

## 2013-12-25 MED ORDER — FENTANYL CITRATE 0.05 MG/ML IJ SOLN
INTRAMUSCULAR | Status: AC
  Filled 2013-12-25: qty 4

## 2013-12-25 MED ORDER — ROCURONIUM BROMIDE 50 MG/5ML IV SOLN
50.0000 mg | Freq: Once | INTRAVENOUS | Status: AC
  Administered 2013-12-25: 50 mg via INTRAVENOUS

## 2013-12-25 MED ORDER — ETOMIDATE 2 MG/ML IV SOLN
10.0000 mg | Freq: Once | INTRAVENOUS | Status: AC
  Administered 2013-12-25: 10 mg via INTRAVENOUS

## 2013-12-25 MED ORDER — BIOTENE DRY MOUTH MT LIQD
15.0000 mL | Freq: Four times a day (QID) | OROMUCOSAL | Status: DC
  Administered 2013-12-25 – 2013-12-27 (×7): 15 mL via OROMUCOSAL

## 2013-12-25 MED ORDER — INSULIN ASPART 100 UNIT/ML ~~LOC~~ SOLN
2.0000 [IU] | SUBCUTANEOUS | Status: DC
  Administered 2013-12-25 (×3): 4 [IU] via SUBCUTANEOUS
  Administered 2013-12-25 – 2013-12-26 (×4): 2 [IU] via SUBCUTANEOUS
  Administered 2013-12-26 (×2): 4 [IU] via SUBCUTANEOUS
  Administered 2013-12-27 – 2013-12-30 (×12): 2 [IU] via SUBCUTANEOUS

## 2013-12-25 MED ORDER — FENTANYL CITRATE 0.05 MG/ML IJ SOLN
50.0000 ug | INTRAMUSCULAR | Status: DC | PRN
  Administered 2013-12-25 – 2013-12-29 (×15): 50 ug via INTRAVENOUS
  Administered 2014-01-02: 100 ug via INTRAVENOUS
  Administered 2014-01-02 – 2014-01-05 (×6): 50 ug via INTRAVENOUS
  Filled 2013-12-25 (×23): qty 2

## 2013-12-25 MED ORDER — PROPOFOL 10 MG/ML IV EMUL
0.0000 ug/kg/min | INTRAVENOUS | Status: DC
  Administered 2013-12-25: 20 ug/kg/min via INTRAVENOUS
  Filled 2013-12-25: qty 100

## 2013-12-25 MED ORDER — CHLORHEXIDINE GLUCONATE 0.12 % MT SOLN
15.0000 mL | Freq: Two times a day (BID) | OROMUCOSAL | Status: DC
  Administered 2013-12-25 – 2013-12-26 (×4): 15 mL via OROMUCOSAL
  Filled 2013-12-25 (×3): qty 15

## 2013-12-25 NOTE — Procedures (Signed)
Central Venous Catheter Insertion Procedure Note Maurice Paul [id number] 12/18/1945  Procedure: Insertion of Central Venous Catheter Indications: Assessment of intravascular volume, Drug and/or fluid administration and Frequent blood sampling  Procedure Details Consent: Unable to obtain consent because of emergent medical necessity. Time Out: Verified patient identification, verified procedure, site/side was marked, verified correct patient position, special equipment/implants available, medications/allergies/relevent history reviewed, required imaging and test results available.  Performed  Maximum sterile technique was used including antiseptics, cap, gloves, gown, hand hygiene, mask and sheet. Skin prep: Chlorhexidine; local anesthetic administered A antimicrobial bonded/coated triple lumen catheter was placed in the left internal jugular vein using the Seldinger technique. Ultrasound guidance used.yes Catheter placed to 22 cm. Blood aspirated via all 3 ports and then flushed x 3. Line sutured x 2 and dressing applied.  Evaluation Blood flow good Complications: No apparent complications Patient did tolerate procedure well. Chest X-ray ordered to verify placement.  CXR: pending.  Maurice Paul, ACNP [facility] Pager [phone] or [phone]  I was present and supervised procedure.  U/S used in placement.  Maurice ReedyWesam G. Yacoub, M.D. [facility]. Pager: [phone]. After hours pager: [phone].

## 2013-12-25 NOTE — Progress Notes (Signed)
Pt examined earlier this shift. Noted to be essentially unresponsive and hypoxic despite 100% NRB mask. ABG revealed significant hypercarbia with PCO2 96 and PO2 110. BiPAP appplied without any improvement in hypoxia. PCCM notified and pt transferred to ICU with plans to intubate.  Junious SilkAllison Ellis, ANP

## 2013-12-25 NOTE — Progress Notes (Signed)
Patient seen, examined and discussed with the nurse practitioner. As above, patient seen this morning with worsening oxygen saturations. Blood gas ordered noting respiratory acidosis with a PCO2 of 92. Patient started on BiPAP and still having oxygen desaturations. Cranial care was notified. Patient self is minimally responsive, even to sternal rub. Lungs overall which decreased breath sounds throughout. Spoke with neurosurgery who had reviewed CT did not feel that bleeding was source of respiratory distress. Pupils were responsive to light. Agree with neurosurgery assessment. Critical care transfer patient to neuro ICU for intubation.  Time spent: 35 minutes

## 2013-12-25 NOTE — Consult Note (Signed)
PULMONARY / CRITICAL CARE MEDICINE   Name: Maurice Paul MRN: [id number] DOB: 18-Oct-1945    ADMISSION DATE:  12/24/2013 CONSULTATION DATE:  12/25/2013  REFERRING MD :  [name] PRIMARY SERVICE: PCCM  CHIEF COMPLAINT:  SAH  BRIEF PATIENT DESCRIPTION: 68 year old male with admitted 3/18 with traumatic SAH and SDH. 3/19 mental and respiratory status decompensated and PCCM has been asked to assist with his management.   SIGNIFICANT EVENTS / STUDIES:  3/18 CT head- Small SAH Left posterior parietal, Persistent SDH along falx and tentorium. 3/19 CT head- No change in amount of intracranial blood, no hydrocephalus.   LINES / TUBES: PIV  CULTURES: None  ANTIBIOTICS: None  HISTORY OF PRESENT ILLNESS:  68 y.o. male with no significant PMH who presents after a fall. History obtain from wife. 3/18 she heard a noise and found that the patient had fallen down the stairs. Probably fell about 8 to 10 steps. He was breathing, but unconscious. In the ED he was found to have small SAH and SDH. 3/19 in the am his respiratory and mental status decompensated, PCCM asked to assist with ICU management.     PAST MEDICAL HISTORY :  Past Medical History  Diagnosis Date  . PVD (peripheral vascular disease)    Past Surgical History  Procedure Laterality Date  . Cyst removal trunk     Prior to Admission medications   Not on File   No Known Allergies  FAMILY HISTORY:  History reviewed. No pertinent family history. SOCIAL HISTORY:  reports that he quit smoking about 15 years ago. His smokeless tobacco use includes Chew. He reports that he drinks alcohol. His drug history is not on file.  REVIEW OF SYSTEMS:  Unable due to encephalopathy  SUBJECTIVE:   VITAL SIGNS: Temp:  [98.5 F (36.9 C)-99.2 F (37.3 C)] 99 F (37.2 C) (03/19 0817) Pulse Rate:  [78-108] 83 (03/19 0817) Resp:  [14-33] 21 (03/19 0817) BP: (113-151)/(44-86) 126/58 mmHg (03/19 0817) SpO2:  [86 %-99 %] 90 % (03/19  0817) Weight:  [135.6 kg (298 lb 15.1 oz)] 135.6 kg (298 lb 15.1 oz) (03/19 0359) HEMODYNAMICS:   VENTILATOR SETTINGS:   INTAKE / OUTPUT: Intake/Output     03/18 0701 - 03/19 0700 03/19 0701 - 03/20 0700   I.V. (mL/kg) 769.2 (5.7)    Total Intake(mL/kg) 769.2 (5.7)    Urine (mL/kg/hr) 755 (0.2)    Total Output 755     Net +14.2            PHYSICAL EXAMINATION: General:  Acutely ill appearing male on BiPAP in severe distress Neuro:  Obtunded HEENT:  [place]/AT Cardiovascular:  RRR Lungs:  Diminished bilateral Abdomen:  Obese, soft non-distended Musculoskeletal:  No acute deformity Skin:  Intact  LABS:  CBC  Recent Labs Lab 12/24/13 0420 12/25/13 0400  WBC 9.0 12.9*  HGB 16.5 16.3  HCT 48.3 49.7  PLT 159 179   Coag's  Recent Labs Lab 12/24/13 0520  APTT 28  INR 1.10   BMET  Recent Labs Lab 12/24/13 0420 12/25/13 0400  NA 138 140  K 4.0 4.6  CL 95* 98  CO2 26 31  BUN 7 6  CREATININE 0.71 0.71  GLUCOSE 162* 170*   Electrolytes  Recent Labs Lab 12/24/13 0420 12/25/13 0400  CALCIUM 8.8 8.5   Sepsis Markers No results found for this basename: LATICACIDVEN, PROCALCITON, O2SATVEN,  in the last 168 hours ABG  Recent Labs Lab 12/24/13 0951 12/25/13 0840  PHART 7.376 7.203*  PCO2ART 49.9* 96.6*  PO2ART 80.0 110.0*   Liver Enzymes No results found for this basename: AST, ALT, ALKPHOS, BILITOT, ALBUMIN,  in the last 168 hours Cardiac Enzymes No results found for this basename: TROPONINI, PROBNP,  in the last 168 hours Glucose No results found for this basename: GLUCAP,  in the last 168 hours  Imaging Ct Head Wo Contrast  12/25/2013   CLINICAL DATA:  History of intracranial hemorrhage.  EXAM: CT HEAD WITHOUT CONTRAST  TECHNIQUE: Contiguous axial images were obtained from the base of the skull through the vertex without intravenous contrast.  COMPARISON:  Head CT scan 12/24/2013.  FINDINGS: Small amount of subdural blood along the falx is again  seen. Largest component is posterior and on the right measuring 0.8 cm, unchanged. A small amount of subarachnoid blood over the left parietal convexities is also unchanged. For also appears to be some blood along the tentorium. There is no new hemorrhage. No intraventricular blood, hydrocephalus or midline shift is identified. The calvarium is intact.  IMPRESSION: No change in a small amount of subdural blood and subarachnoid blood over the left frontal convexities. Negative for hydrocephalus or other new abnormality.   Electronically Signed   By: Drusilla Kanner M.D.   On: 12/25/2013 03:50   Ct Head Wo Contrast  12/24/2013   CLINICAL DATA:  Fall.  EXAM: CT HEAD WITHOUT CONTRAST  TECHNIQUE: Contiguous axial images were obtained from the base of the skull through the vertex without intravenous contrast.  COMPARISON:  CT HEAD W/O CM dated 12/24/2013  FINDINGS: Again noted is small amount of sub arachnoid hemorrhage left upper posterior parietal convexity. Subdural hematoma again noted in the falx and tentorium. The subdural hematoma in the upper vertex along the falx on the left has increased slightly in size. No prominent mass effect. Its diameter is approximately 8 mm. No mass. No hydrocephalus. No acute bony abnormality.  IMPRESSION: 1. Persistent changes of small subarachnoid hemorrhage in the left posterior parietal vertex. 2. Persistent subdural hematoma along the falx and tentorium. Falcine subdural hematoma has increased slightly in size. No prominent mass effect.   Electronically Signed   By: Maisie Fus  Register   On: 12/24/2013 18:33   Ct Head Wo Contrast  12/24/2013   CLINICAL DATA:  Status post fall.  Confusion.  EXAM: CT HEAD WITHOUT CONTRAST  CT CERVICAL SPINE WITHOUT CONTRAST  TECHNIQUE: Multidetector CT imaging of the head and cervical spine was performed following the standard protocol without intravenous contrast. Multiplanar CT image reconstructions of the cervical spine were also generated.   COMPARISON:  None.  FINDINGS: CT HEAD FINDINGS  A small volume of subdural hemorrhage is seen along the tentorium, more prominent on the right, and falx. A small amount of subarachnoid hemorrhage is seen over the high right parietal convexities. No mass, midline shift or hydrocephalus is identified. There is no pneumocephalus. The calvarium is intact.  CT CERVICAL SPINE FINDINGS  No fracture or malalignment of the cervical spine is identified. Intervertebral disc space height is maintained with some anterior endplate spurring noted. Lung apices are clear.  IMPRESSION: Head CT is positive for a small amount of subarachnoid hemorrhage over the high left parietal convexities. A small amount of subdural hemorrhage is seen along the falx and tentorium. There is no hydrocephalus or midline shift.  No acute finding cervical spine.  Critical Value/emergent results were called by telephone at the time of interpretation on 12/24/2013 at 4:59 AM to Dr. Wayland Salinas , who verbally acknowledged  these results.   Electronically Signed   By: Drusilla Kannerhomas  Dalessio M.D.   On: 12/24/2013 05:01   Ct Cervical Spine Wo Contrast  12/24/2013   CLINICAL DATA:  Status post fall.  Confusion.  EXAM: CT HEAD WITHOUT CONTRAST  CT CERVICAL SPINE WITHOUT CONTRAST  TECHNIQUE: Multidetector CT imaging of the head and cervical spine was performed following the standard protocol without intravenous contrast. Multiplanar CT image reconstructions of the cervical spine were also generated.  COMPARISON:  None.  FINDINGS: CT HEAD FINDINGS  A small volume of subdural hemorrhage is seen along the tentorium, more prominent on the right, and falx. A small amount of subarachnoid hemorrhage is seen over the high right parietal convexities. No mass, midline shift or hydrocephalus is identified. There is no pneumocephalus. The calvarium is intact.  CT CERVICAL SPINE FINDINGS  No fracture or malalignment of the cervical spine is identified. Intervertebral disc space  height is maintained with some anterior endplate spurring noted. Lung apices are clear.  IMPRESSION: Head CT is positive for a small amount of subarachnoid hemorrhage over the high left parietal convexities. A small amount of subdural hemorrhage is seen along the falx and tentorium. There is no hydrocephalus or midline shift.  No acute finding cervical spine.  Critical Value/emergent results were called by telephone at the time of interpretation on 12/24/2013 at 4:59 AM to Dr. Wayland SalinasJOHN BEDNAR , who verbally acknowledged these results.   Electronically Signed   By: Drusilla Kannerhomas  Dalessio M.D.   On: 12/24/2013 05:01   Dg Chest Port 1 View  12/25/2013   CLINICAL DATA:  Unexplained hypoxia  EXAM: PORTABLE CHEST - 1 VIEW  COMPARISON:  DG CHEST 1V PORT dated 12/24/2013  FINDINGS: The lungs are less well inflated on the current portable semi-erect view. Increased density at the lung bases obscures the hemidiaphragm consistent with atelectasis and likely small amounts of pleural fluid. The pulmonary interstitial markings are mildly increased. The cardiopericardial silhouette remains enlarged. The trachea is midline. The observed portions of the bony thorax exhibit no acute abnormalities.  IMPRESSION: There is bilateral pulmonary hypoinflation which accentuates the pulmonary interstitial markings. Density at the lung bases is consistent with atelectasis or small effusions. There may be low-grade CHF present. When the patient can tolerate the procedure an upright PA and lateral chest x-ray would be of value.   Electronically Signed   By: David  SwazilandJordan   On: 12/25/2013 07:09   Dg Chest Port 1 View  12/24/2013   CLINICAL DATA:  Status post fall.  EXAM: PORTABLE CHEST - 1 VIEW  COMPARISON:  None.  FINDINGS: Lungs are clear. Heart size is upper normal. No pneumothorax or pleural effusion. No focal bony abnormality.  IMPRESSION: No acute disease.   Electronically Signed   By: Drusilla Kannerhomas  Dalessio M.D.   On: 12/24/2013 05:19   CXR:  Bibasilar infiltrates  ASSESSMENT / PLAN:  PULMONARY A: Acute Respiratory Failure due to Encephalopathy secondary to [place]   P:   Intubation Mechanical Ventilation VAP bundle Goal paCO2 35-40 first 8 hours. Then goal is pH 7.35-7.45 Follow CXR  CARDIOVASCULAR A:  BP management in setting of SAH  P:  Keep SBP < 140 mm/Hg PRN hydralazine Insert CVL  Trend CVP - Goal euvolemia  RENAL A:  No acute issues P:   Goal euvolemia Monitor BMP  GASTROINTESTINAL A:  No acute issues P:   PPI for Gi ppx Consider starting TF 3/20  HEMATOLOGIC A:  No acute issues P:  Monitor CBC,  coags  INFECTIOUS A:   Leukocytosis - thick yellow secretions noted during intubation  P:   Monitor off ABX Trend CBC and fever curve  ENDOCRINE A:   Hyperglycemia  P:   Tight glycemic control CBG q4 hours while intubated SSI  NEUROLOGIC A:   SAH - Small SAH Left posterior parietal,   SDH -  along falx and tentorium. Acute Encephalopathy   P:   Neurology following Sedation with propofol/fentanyl Goal RASS -2 Neurochecks every hour   Joneen Roach, ACNP [facility] Pulmonology/Critical Care Pager [phone] or [phone]   TODAY'S SUMMARY: Intubate, insert CVL, transfer to ICU. Will continue to monitor and support. Neurology following.  Diurese and increase PEEP as hypoxemia.  Maintain ventilation for normalization of pH.  Propofol and fentanyl for sedation.  Once mental status improves will begin PS trials.  I have personally obtained a history, examined the patient, evaluated laboratory and imaging results, formulated the assessment and plan and placed orders.  CRITICAL CARE: The patient is critically ill with multiple organ systems failure and requires high complexity decision making for assessment and support, frequent evaluation and titration of therapies, application of advanced monitoring technologies and extensive interpretation of multiple databases. Critical Care Time  devoted to patient care services described in this note is 45 minutes.   Alyson Reedy, M.D. [facility] Pulmonary/Critical Care Medicine. Pager: [phone]. After hours pager: [phone]. 12/25/2013, 9:34 AM

## 2013-12-25 NOTE — Progress Notes (Signed)
Placed pt on bipap pending the abg. After placed on [facility] Transfer went well and pt is resting on bipap in 3100.

## 2013-12-25 NOTE — Progress Notes (Signed)
Subjective: Patient becoming more somnolent this morning, pCO2 96 with a pH of 7.2  Exam: Filed Vitals:   12/25/13 0817  BP: 126/58  Pulse: 83  Temp: 99 F (37.2 C)  Resp: 21   Gen: In bed, NAD MS: Awakens to minor stimuli, does not clearly follow commands. Orients eyes some to noxious stimuli.  GN:FAOZHCN:PERRL, does cross midline both directions, but only minimally,  Motor: withdraws all extermities to noxious stimuli Sensory: as above.   Impression: 68 yo M with CHI and AMS. AMS is likely multifactorial given moderate TBI and hypercarbia. Will continue to follow.   Recommendations: 1) MRI brain 2) will continue to follow  Ritta SlotMcNeill Indiana Gamero, MD [facility] [phone]  If 7pm- 7am, please page neurology on call at [phone].

## 2013-12-25 NOTE — Progress Notes (Signed)
O2 sats sustaining between 87-90% on a NRB mask. Pt arousable, alert to self, follows some commands. No signs or symptoms of distress. NP notified. Will continue to monitor.    M.Foster SimpsonPiel, RN

## 2013-12-25 NOTE — Procedures (Signed)
Intubation Procedure Note Murlean IbaWalter Leckey [id number] 08/29/1946  Procedure: Intubation Indications: Airway protection and maintenance  Procedure Details Consent: Unable to obtain consent because of emergent medical necessity. Time Out: Verified patient identification, verified procedure, site/side was marked, verified correct patient position, special equipment/implants available, medications/allergies/relevent history reviewed, required imaging and test results available.  Performed  Maximum sterile technique was used including antiseptics, cap, gloves, gown, hand hygiene, mask and sheet.  MAC and 4    Evaluation Hemodynamic Status: BP stable throughout; O2 sats: stable throughout Patient's Current Condition: stable Complications: No apparent complications Patient did tolerate procedure well. Chest X-ray ordered to verify placement.  CXR: pending.   HOFFMAN, Clarene CritchleyUL, W 12/25/2013  I was present and supervised procedure.  Alyson ReedyWesam G. Yacoub, M.D. [facility] Pulmonary/Critical Care Medicine. Pager: [phone]. After hours pager: [phone].

## 2013-12-26 ENCOUNTER — Inpatient Hospital Stay (HOSPITAL_COMMUNITY): Payer: Medicare Other

## 2013-12-26 LAB — GLUCOSE, CAPILLARY
GLUCOSE-CAPILLARY: 147 mg/dL — AB (ref 70–99)
GLUCOSE-CAPILLARY: 129 mg/dL — AB (ref 70–99)
GLUCOSE-CAPILLARY: 173 mg/dL — AB (ref 70–99)
GLUCOSE-CAPILLARY: 141 mg/dL — AB (ref 70–99)
Glucose-Capillary: 158 mg/dL — ABNORMAL HIGH (ref 70–99)

## 2013-12-26 LAB — CBC
HCT: 45.9 % (ref 39.0–52.0)
HEMOGLOBIN: 15 g/dL (ref 13.0–17.0)
MCH: 33.5 pg (ref 26.0–34.0)
MCHC: 32.7 g/dL (ref 30.0–36.0)
MCV: 102.5 fL — ABNORMAL HIGH (ref 78.0–100.0)
Platelets: 157 10*3/uL (ref 150–400)
RBC: 4.48 MIL/uL (ref 4.22–5.81)
RDW: 13.7 % (ref 11.5–15.5)
WBC: 11.6 10*3/uL — AB (ref 4.0–10.5)

## 2013-12-26 LAB — BLOOD GAS, ARTERIAL
ACID-BASE EXCESS: 8.6 mmol/L — AB (ref 0.0–2.0)
Bicarbonate: 32.8 mEq/L — ABNORMAL HIGH (ref 20.0–24.0)
DRAWN BY: 24513
FIO2: 40 %
LHR: 18 {breaths}/min
MECHVT: 600 mL
O2 Saturation: 94.4 %
PCO2 ART: 49.1 mmHg — AB (ref 35.0–45.0)
PEEP: 8 cmH2O
Patient temperature: 100.5
TCO2: 34.2 mmol/L (ref 0–100)
pH, Arterial: 7.445 (ref 7.350–7.450)
pO2, Arterial: 65 mmHg — ABNORMAL LOW (ref 80.0–100.0)

## 2013-12-26 LAB — POCT I-STAT 3, ART BLOOD GAS (G3+)
Acid-Base Excess: 8 mmol/L — ABNORMAL HIGH (ref 0.0–2.0)
BICARBONATE: 36 meq/L — AB (ref 20.0–24.0)
O2 Saturation: 93 %
PH ART: 7.386 (ref 7.350–7.450)
PO2 ART: 70 mmHg — AB (ref 80.0–100.0)
Patient temperature: 99
TCO2: 38 mmol/L (ref 0–100)
pCO2 arterial: 60.2 mmHg (ref 35.0–45.0)

## 2013-12-26 LAB — MAGNESIUM: Magnesium: 2.2 mg/dL (ref 1.5–2.5)

## 2013-12-26 LAB — BASIC METABOLIC PANEL
BUN: 17 mg/dL (ref 6–23)
CHLORIDE: 100 meq/L (ref 96–112)
CO2: 33 mEq/L — ABNORMAL HIGH (ref 19–32)
Calcium: 8.4 mg/dL (ref 8.4–10.5)
Creatinine, Ser: 0.85 mg/dL (ref 0.50–1.35)
GFR calc Af Amer: >90 mL/min (ref >90)
GFR, EST NON AFRICAN AMERICAN: 87 mL/min — AB (ref >90)
GLUCOSE: 145 mg/dL — AB (ref 70–99)
POTASSIUM: 3.7 meq/L (ref 3.7–5.3)
Sodium: 144 mEq/L (ref 137–147)

## 2013-12-26 LAB — PHOSPHORUS: Phosphorus: 0.5 mg/dL — CL (ref 2.3–4.6)

## 2013-12-26 MED ORDER — SODIUM CHLORIDE 0.9 % IV BOLUS (SEPSIS)
500.0000 mL | Freq: Once | INTRAVENOUS | Status: AC
  Administered 2013-12-26: 500 mL via INTRAVENOUS

## 2013-12-26 MED ORDER — SODIUM CHLORIDE 0.9 % IV SOLN
25.0000 ug/h | INTRAVENOUS | Status: DC
  Administered 2013-12-26: 25 ug/h via INTRAVENOUS
  Filled 2013-12-26: qty 50

## 2013-12-26 MED ORDER — DEXMEDETOMIDINE HCL IN NACL 200 MCG/50ML IV SOLN
0.4000 ug/kg/h | INTRAVENOUS | Status: DC
Start: 2013-12-26 — End: 2013-12-29
  Administered 2013-12-26: 0.4 ug/kg/h via INTRAVENOUS
  Filled 2013-12-26: qty 50

## 2013-12-26 MED ORDER — POTASSIUM PHOSPHATE DIBASIC 3 MMOLE/ML IV SOLN
30.0000 mmol | Freq: Once | INTRAVENOUS | Status: AC
  Administered 2013-12-26: 30 mmol via INTRAVENOUS
  Filled 2013-12-26: qty 10

## 2013-12-26 MED ORDER — SODIUM CHLORIDE 0.9 % IV SOLN
500.0000 mg | Freq: Two times a day (BID) | INTRAVENOUS | Status: DC
  Administered 2013-12-26 – 2013-12-28 (×4): 500 mg via INTRAVENOUS
  Filled 2013-12-26 (×5): qty 5

## 2013-12-26 MED ORDER — HALOPERIDOL LACTATE 5 MG/ML IJ SOLN
5.0000 mg | Freq: Once | INTRAMUSCULAR | Status: AC
Start: 2013-12-26 — End: 2013-12-27
  Administered 2013-12-27: 5 mg via INTRAVENOUS
  Filled 2013-12-26: qty 1

## 2013-12-26 MED ORDER — SODIUM CHLORIDE 0.9 % IV SOLN
1.0000 mg/h | INTRAVENOUS | Status: DC
  Filled 2013-12-26: qty 10

## 2013-12-26 MED ORDER — FUROSEMIDE 10 MG/ML IJ SOLN
40.0000 mg | Freq: Three times a day (TID) | INTRAMUSCULAR | Status: AC
  Administered 2013-12-26 (×2): 40 mg via INTRAVENOUS
  Filled 2013-12-26 (×2): qty 4

## 2013-12-26 MED ORDER — LEVETIRACETAM 500 MG PO TABS
500.0000 mg | ORAL_TABLET | Freq: Two times a day (BID) | ORAL | Status: DC
  Filled 2013-12-26 (×2): qty 1

## 2013-12-26 NOTE — Progress Notes (Signed)
Fentanyl drip wasted, 2500 mcg/mL, 200 cc wasted. Pieter PartridgeSusannah Washburn, RN as witness.   Holly Bodilyulbertson, [place] Leigh

## 2013-12-26 NOTE — Progress Notes (Signed)
Subjective: Patient more awake today  Exam: Filed Vitals:   12/26/13 0800  BP: 148/84  Pulse: 66  Temp:   Resp: 16   Gen: In bed, NAD MS: Awakens to minor stimuli, follows commands ZO:XWRUECN:PERRL, does cross midline both directions Motor: withdraws all extermities to noxious stimuli Sensory: as above.   Impression: 68 yo M with CHI and AMS. AMS is improving, likely due to combination of TBI and respiratory failure. His TBI could be further assessed with MRI. Given some question of intermittent worsening, will get EEG.   Recommendations: 1) MRI brain 2) EEG 3) will continue to follow  Ritta SlotMcNeill Kainan Patty, MD [facility] [phone]  If 7pm- 7am, please page neurology on call at [phone].

## 2013-12-26 NOTE — Progress Notes (Signed)
CCM called. Informed DR. Herma CarsonZ about pt's vitals and medications (sedation drips) and this nurse concern for BP trending down on different sedation gtts. Discussed pt course of care and different medications. Order to start CVP and call MD back.

## 2013-12-26 NOTE — Progress Notes (Addendum)
Pt became very agitated, nearly combative, requiring multiple nurses to keep pt from getting out of bed and pulling off 02. STAT ABG ordered by Dr. Molli KnockYacoub and MD came to see pt in room. MD made aware of results and order received to start Precedex IV. Also, posey belt restraint applied for pt's safety. Precedex started and pt began to calm down. D/w MD, MRI unable to be performed at this time d/t agitation but will reevaluate.   Holly Bodilyulbertson, Railey Glad Leigh

## 2013-12-26 NOTE — Progress Notes (Signed)
eLink Physician-Brief Progress Note Patient Name: Maurice Paul DOB: 08/13/1946 MRN: [id number]  Date of Service  12/26/2013   HPI/Events of Note   Hypotension with Propofol and Precedex  eICU Interventions   Propofol and Precedex d/c'd Fentanyl and Versed started Check CVP NS 500 mL x 1    Intervention Category Major Interventions: Hypotension - evaluation and management  Terese Heier 12/26/2013, 12:23 AM

## 2013-12-26 NOTE — Procedures (Signed)
History: 68 yo M with TBI  Technique: This is a 17 channel routine scalp EEG performed at the bedside with bipolar and monopolar montages arranged in accordance to the international 10/20 system of electrode placement. One channel was dedicated to EKG recording.    Background: The background contains generalized and frontally predominant irregular delta and theta activity. At times, there are bifrontally predominant sharply contoured waves with a triphasic morphology. These occur in trains at times with a periodicity of 0.5 - 1 Hz. There is a well defined posterior dominant rhythm of  Hz that attenuates with eye opening.   Photic stimulation: Physiologic driving is not performed  EEG Abnormalities: 1) Triphasic waves 2) Generalized slow activity  Clinical Interpretation: This EEG is consistent with a moderate generalized non-specific cerebral dysfunction(encephalopathy). There was no seizure or seizure predisposition recorded on this study.   Ritta SlotMcNeill Kirkpatrick, MD [facility] [phone]  If 7pm- 7am, please page neurology on call as listed in AMION.

## 2013-12-26 NOTE — Progress Notes (Signed)
RN walked into room and pt was noted to be staring straight, but not attempting to speak or FC. Approximately 10 seconds later, pt began looking around, [facility], answered RN question. MD made aware of temporary behavioral change and keppra was ordered for ?seizure activity. MD also aware of earlier EEG completion and reviewed.   Maurice Paul, Maurice Paul

## 2013-12-26 NOTE — Progress Notes (Signed)
EEG completed; results pending.    

## 2013-12-26 NOTE — Procedures (Signed)
Extubation Procedure Note  Patient Details:   Name: Maurice Paul DOB: 11/02/1945 MRN: [id number]   Airway Documentation:     Evaluation  O2 sats: stable throughout Complications: No apparent complications Patient did tolerate procedure well. Bilateral Breath Sounds: Clear;Diminished Suctioning: Oral;Airway Yes  Pt tolerated wean, positive for cuff leak, extubated to 6lpm [name]. No stridor or dyspnea noted after extubation. All vitals are within normal limits at this time. RT will continue to monitor.   Armando GangMike, Leaann Nevils C 12/26/2013, 12:51 PM

## 2013-12-26 NOTE — Progress Notes (Signed)
CRITICAL VALUE ALERT  Critical value received:  Phosphorus 0.5  Date of notification:  12/26/2013  Time of notification:  7:36 AM  Critical value read back:yes  Nurse who received alert:  B. Marjo Bickerulbertson, RN  MD notified (1st page):  CCM MD   Time of first page:  7:36 AM   MD notified (2nd page):  Time of second page:  Responding MD:  Dr. Sung AmabileSimonds  Time MD responded:  7:37 AM  No orders received, MD to review during AM rounds.

## 2013-12-26 NOTE — Progress Notes (Signed)
Transport up to assist taking pt to MRI. Pt tossing and turning in bed, attempting to get up even with Posey on. Wife unable to calm pt, this nurse unable to use calming techniques and attempt to reorient pt. Fentanyl given, see emar. BP marginal and has been trending low. MRI notified of pt appearance/actions. Informed that they were unable to wait on pt d/t other waiting pts and that pt will be pushed back to AM. Family notified.

## 2013-12-26 NOTE — Progress Notes (Signed)
PULMONARY / CRITICAL CARE MEDICINE   Name: Maurice Paul MRN: [id number] DOB: 12-05-1945    ADMISSION DATE:  12/24/2013 CONSULTATION DATE:  12/25/2013  REFERRING MD :  [name] PRIMARY SERVICE: PCCM  CHIEF COMPLAINT:  SAH  BRIEF PATIENT DESCRIPTION: 68 year old male with admitted 3/18 with traumatic SAH and SDH. 3/19 mental and respiratory status decompensated and PCCM has been asked to assist with his management.   SIGNIFICANT EVENTS / STUDIES:  3/18 CT head- Small SAH Left posterior parietal, Persistent SDH along falx and tentorium. 3/19 CT head- No change in amount of intracranial blood, no hydrocephalus.   LINES / TUBES: PIV  CULTURES: None  ANTIBIOTICS: None  SUBJECTIVE:   VITAL SIGNS: Temp:  [98.7 F (37.1 C)-101.9 F (38.8 C)] 99.4 F (37.4 C) (03/20 0700) Pulse Rate:  [56-95] 66 (03/20 0800) Resp:  [16-21] 16 (03/20 0800) BP: (84-148)/(32-89) 148/84 mmHg (03/20 0800) SpO2:  [92 %-98 %] 95 % (03/20 0800) FiO2 (%):  [40 %-100 %] 40 % (03/20 0800)  HEMODYNAMICS: CVP:  [6 mmHg-13 mmHg] 6 mmHg  VENTILATOR SETTINGS: Vent Mode:  [-] PSV FiO2 (%):  [40 %-100 %] 40 % Set Rate:  [10 bmp-18 bmp] 18 bmp Vt Set:  [600 mL] 600 mL PEEP:  [5 cmH20-8 cmH20] 8 cmH20 Pressure Support:  [5 cmH20-14 cmH20] 5 cmH20 Plateau Pressure:  [22 cmH20-26 cmH20] 24 cmH20  INTAKE / OUTPUT: Intake/Output     03/19 0701 - 03/20 0700 03/20 0701 - 03/21 0700   I.V. (mL/kg) 1092.2 (8.1)    Total Intake(mL/kg) 1092.2 (8.1)    Urine (mL/kg/hr) 1550 (0.5)    Total Output 1550     Net -457.8          Urine Occurrence 1 x     PHYSICAL EXAMINATION: General:  Chronically ill appearing, arousable. Neuro:  Awake, follows some commands but remains somnolent. HEENT:  [place]/AT. Cardiovascular:  RRR, Nl S1/S2, -M/R/G. Lungs:  Diminished bilateral. Abdomen:  Obese, soft non-distended Musculoskeletal:  No acute deformity Skin:  Intact.  LABS:  CBC  Recent Labs Lab 12/24/13 0420  12/25/13 0400 12/26/13 0320  WBC 9.0 12.9* 11.6*  HGB 16.5 16.3 15.0  HCT 48.3 49.7 45.9  PLT 159 179 157   Coag's  Recent Labs Lab 12/24/13 0520  APTT 28  INR 1.10   BMET  Recent Labs Lab 12/24/13 0420 12/25/13 0400 12/26/13 0320  NA 138 140 144  K 4.0 4.6 3.7  CL 95* 98 100  CO2 26 31 33*  BUN 7 6 17   CREATININE 0.71 0.71 0.85  GLUCOSE 162* 170* 145*   Electrolytes  Recent Labs Lab 12/24/13 0420 12/25/13 0400 12/26/13 0320  CALCIUM 8.8 8.5 8.4  MG  --   --  2.2  PHOS  --   --  0.5*   Sepsis Markers No results found for this basename: LATICACIDVEN, PROCALCITON, O2SATVEN,  in the last 168 hours ABG  Recent Labs Lab 12/25/13 0840 12/25/13 1123 12/26/13 0340  PHART 7.203* 7.372 7.445  PCO2ART 96.6* 60.3* 49.1*  PO2ART 110.0* 106.0* 65.0*   Liver Enzymes No results found for this basename: AST, ALT, ALKPHOS, BILITOT, ALBUMIN,  in the last 168 hours Cardiac Enzymes  Recent Labs Lab 12/25/13 0400  PROBNP 577.4*   Glucose  Recent Labs Lab 12/25/13 1140 12/25/13 1619 12/25/13 2019 12/25/13 2325 12/26/13 0327 12/26/13 0738  GLUCAP 169* 155* 133* 155* 147* 129*    Imaging Ct Head Wo Contrast  12/25/2013   CLINICAL  DATA:  History of intracranial hemorrhage.  EXAM: CT HEAD WITHOUT CONTRAST  TECHNIQUE: Contiguous axial images were obtained from the base of the skull through the vertex without intravenous contrast.  COMPARISON:  Head CT scan 12/24/2013.  FINDINGS: Small amount of subdural blood along the falx is again seen. Largest component is posterior and on the right measuring 0.8 cm, unchanged. A small amount of subarachnoid blood over the left parietal convexities is also unchanged. For also appears to be some blood along the tentorium. There is no new hemorrhage. No intraventricular blood, hydrocephalus or midline shift is identified. The calvarium is intact.  IMPRESSION: No change in a small amount of subdural blood and subarachnoid blood over  the left frontal convexities. Negative for hydrocephalus or other new abnormality.   Electronically Signed   By: Drusilla Kanner M.D.   On: 12/25/2013 03:50   Ct Head Wo Contrast  12/24/2013   CLINICAL DATA:  Fall.  EXAM: CT HEAD WITHOUT CONTRAST  TECHNIQUE: Contiguous axial images were obtained from the base of the skull through the vertex without intravenous contrast.  COMPARISON:  CT HEAD W/O CM dated 12/24/2013  FINDINGS: Again noted is small amount of sub arachnoid hemorrhage left upper posterior parietal convexity. Subdural hematoma again noted in the falx and tentorium. The subdural hematoma in the upper vertex along the falx on the left has increased slightly in size. No prominent mass effect. Its diameter is approximately 8 mm. No mass. No hydrocephalus. No acute bony abnormality.  IMPRESSION: 1. Persistent changes of small subarachnoid hemorrhage in the left posterior parietal vertex. 2. Persistent subdural hematoma along the falx and tentorium. Falcine subdural hematoma has increased slightly in size. No prominent mass effect.   Electronically Signed   By: Maisie Fus  Register   On: 12/24/2013 18:33   Dg Chest Port 1 View  12/26/2013   CLINICAL DATA:  Endotracheal tube  EXAM: PORTABLE CHEST - 1 VIEW  COMPARISON:  DG CHEST 1V PORT dated 12/25/2013; DG CHEST 1V PORT dated 12/25/2013; DG CHEST 1V PORT dated 12/24/2013  FINDINGS: Grossly unchanged enlarged cardiac silhouette and mediastinal contours given persistently reduced lung volumes and patient rotation. Pulmonary venous congestion without definite evidence of edema. Grossly unchanged bibasilar heterogeneous opacities, right greater than left. Small / trace bilateral effusions are grossly unchanged. Unchanged bones.  IMPRESSION: 1.  Stable positioning of support apparatus.  No pneumothorax. 2. Mild pulmonary venous congestion without definite evidence of edema. 3. Persistently reduced lung volumes with grossly unchanged bibasilar opacities, atelectasis  versus infiltrate.   Electronically Signed   By: Simonne Come M.D.   On: 12/26/2013 07:54   Dg Chest Port 1 View  12/25/2013   CLINICAL DATA:  Central line placement  EXAM: PORTABLE CHEST - 1 VIEW  COMPARISON:  12/25/2013  FINDINGS: Cardiomegaly again noted. There is probable small right pleural effusion with hazy right basilar atelectasis or infiltrate. Endotracheal tube in place with tip 3.4 cm above the carina. Left IJ central line with tip in SVC. No diagnostic pneumothorax.  IMPRESSION: There is probable small right pleural effusion with hazy right basilar atelectasis or infiltrate. Endotracheal tube in place with tip 3.4 cm above the carina. Left IJ central line with tip in SVC. No diagnostic pneumothorax.   Electronically Signed   By: Natasha Mead M.D.   On: 12/25/2013 11:37   Dg Chest Port 1 View  12/25/2013   CLINICAL DATA:  Unexplained hypoxia  EXAM: PORTABLE CHEST - 1 VIEW  COMPARISON:  DG CHEST  1V PORT dated 12/24/2013  FINDINGS: The lungs are less well inflated on the current portable semi-erect view. Increased density at the lung bases obscures the hemidiaphragm consistent with atelectasis and likely small amounts of pleural fluid. The pulmonary interstitial markings are mildly increased. The cardiopericardial silhouette remains enlarged. The trachea is midline. The observed portions of the bony thorax exhibit no acute abnormalities.  IMPRESSION: There is bilateral pulmonary hypoinflation which accentuates the pulmonary interstitial markings. Density at the lung bases is consistent with atelectasis or small effusions. There may be low-grade CHF present. When the patient can tolerate the procedure an upright PA and lateral chest x-ray would be of value.   Electronically Signed   By: David  Swazil[place]   On: 12/25/2013 07:09   CXR: Bibasilar infiltrates  ASSESSMENT / PLAN:  PULMONARY A: Acute Respiratory Failure due to Encephalopathy [facility]   P:   - Diureses as ordered. - PS trials,  hold sedation for possible extubation today. - VAP bundle. - Goal paCO2 35-40 first 8 hours. Then goal is pH 7.35-7.45. - Follow CXR and ABG in AM.  CARDIOVASCULAR A:  BP management in setting of SAH  P:  - Keep SBP < 140 mm/Hg. - PRN hydralazine. - Diureses as ordered.  RENAL A:  No acute issues P:   - Lasix 40 mg IV q8 hours x2 doses. - K3PO4 30 mmol IV x1. - Monitor BMP.  GASTROINTESTINAL A:  No acute issues P:   - PPI for Gi ppx - Start TF today if unable to extubate today.  HEMATOLOGIC A:  No acute issues P:  - Monitor CBC, coags.  INFECTIOUS A:   Leukocytosis - thick yellow secretions noted during intubation  P:   - Monitor off ABX. - Trend CBC and fever curve.  ENDOCRINE A:   Hyperglycemia  P:   - Tight glycemic control. - CBG q4 hours while intubated. - SSI.  NEUROLOGIC A:   SAH - Small SAH Left posterior parietal,   SDH -  along falx and tentorium. Acute Encephalopathy   P:   - Neurology following. - Sedation with propofol/fentanyl. - Goal RASS -2. - MRI and EEG today.  TODAY'S SUMMARY: Weaning great today, only obstacle for extubation is LOC at this point.  Will hold fentanyl if more awake will extubate and place on supplemental O2, if unable to extubate then will start TF.  MRI and EEG to be done per neuro today.  Replace electrolytes and diurese.  I have personally obtained a history, examined the patient, evaluated laboratory and imaging results, formulated the assessment and plan and placed orders.  CRITICAL CARE: The patient is critically ill with multiple organ systems failure and requires high complexity decision making for assessment and support, frequent evaluation and titration of therapies, application of advanced monitoring technologies and extensive interpretation of multiple databases. Critical Care Time devoted to patient care services described in this note is 35 minutes.   Alyson ReedyWesam G. Yacoub, M.D. [facility]  Care [facility]. Pager: [phone]. After hours pager: [phone]. 12/26/2013, 9:00 AM

## 2013-12-27 ENCOUNTER — Inpatient Hospital Stay (HOSPITAL_COMMUNITY): Payer: Medicare Other

## 2013-12-27 LAB — CBC
HEMATOCRIT: 47.8 % (ref 39.0–52.0)
HEMOGLOBIN: 15.7 g/dL (ref 13.0–17.0)
MCH: 33.9 pg (ref 26.0–34.0)
MCHC: 32.8 g/dL (ref 30.0–36.0)
MCV: 103.2 fL — ABNORMAL HIGH (ref 78.0–100.0)
Platelets: 164 10*3/uL (ref 150–400)
RBC: 4.63 MIL/uL (ref 4.22–5.81)
RDW: 14 % (ref 11.5–15.5)
WBC: 11.1 10*3/uL — AB (ref 4.0–10.5)

## 2013-12-27 LAB — BASIC METABOLIC PANEL
BUN: 17 mg/dL (ref 6–23)
CHLORIDE: 99 meq/L (ref 96–112)
CO2: 35 meq/L — AB (ref 19–32)
Calcium: 8.2 mg/dL — ABNORMAL LOW (ref 8.4–10.5)
Creatinine, Ser: 0.7 mg/dL (ref 0.50–1.35)
GFR calc non Af Amer: >90 mL/min (ref >90)
GLUCOSE: 121 mg/dL — AB (ref 70–99)
POTASSIUM: 3.4 meq/L — AB (ref 3.7–5.3)
Sodium: 146 mEq/L (ref 137–147)

## 2013-12-27 LAB — MAGNESIUM: Magnesium: 2.3 mg/dL (ref 1.5–2.5)

## 2013-12-27 LAB — GLUCOSE, CAPILLARY
GLUCOSE-CAPILLARY: 120 mg/dL — AB (ref 70–99)
GLUCOSE-CAPILLARY: 125 mg/dL — AB (ref 70–99)
GLUCOSE-CAPILLARY: 143 mg/dL — AB (ref 70–99)
Glucose-Capillary: 130 mg/dL — ABNORMAL HIGH (ref 70–99)
Glucose-Capillary: 122 mg/dL — ABNORMAL HIGH (ref 70–99)
Glucose-Capillary: 142 mg/dL — ABNORMAL HIGH (ref 70–99)

## 2013-12-27 LAB — PHOSPHORUS: Phosphorus: 2.4 mg/dL (ref 2.3–4.6)

## 2013-12-27 MED ORDER — POTASSIUM CHLORIDE 10 MEQ/100ML IV SOLN
10.0000 meq | INTRAVENOUS | Status: DC
Start: 2013-12-27 — End: 2013-12-27

## 2013-12-27 MED ORDER — POTASSIUM CHLORIDE 10 MEQ/50ML IV SOLN
INTRAVENOUS | Status: AC
  Administered 2013-12-27: 10 meq via INTRAVENOUS
  Filled 2013-12-27: qty 50

## 2013-12-27 MED ORDER — HALOPERIDOL LACTATE 5 MG/ML IJ SOLN
1.0000 mg | INTRAMUSCULAR | Status: DC | PRN
  Administered 2013-12-27 – 2013-12-28 (×2): 4 mg via INTRAVENOUS
  Administered 2013-12-28: 2 mg via INTRAVENOUS
  Administered 2013-12-28 – 2013-12-29 (×2): 4 mg via INTRAVENOUS
  Filled 2013-12-27 (×5): qty 1

## 2013-12-27 MED ORDER — POTASSIUM CHLORIDE 10 MEQ/50ML IV SOLN
10.0000 meq | INTRAVENOUS | Status: AC
  Administered 2013-12-27 (×2): 10 meq via INTRAVENOUS
  Filled 2013-12-27: qty 50

## 2013-12-27 NOTE — Evaluation (Signed)
Clinical/Bedside Swallow Evaluation Patient Details  Name: Maurice IbaWalter Stacy MRN: [id number] Date of Birth: 06/23/1946  Today's Date: 12/27/2013 Time: 7829-56211154-1210 SLP Time Calculation (min): 16 min  Past Medical History:  Past Medical History  Diagnosis Date  . PVD (peripheral vascular disease)    Past Surgical History:  Past Surgical History  Procedure Laterality Date  . Cyst removal trunk     HPI:  68 y.o. male with no significant PMH who presents after a fall.  Per wife she heard a noise, she then found her husband lying in the stair. He fell probably from 8 to 10 steps of stair. He was breathing, but unconscious. She report that he has been congested for last few days.  CT Persistent changes of small subarachnoid hemorrhage in the left posterior parietal vertex,  Persistent subdural hematoma along the falx and tentorium, Falcine subdural hematoma has increased slightly in size. No prominent mass effect.  Intubated 2 days.    Assessment / Plan / Recommendation Clinical Impression  Pt. moderately lethargic with efffects of Haldol in system.  Exhibited oropharyngeal concerns for decreased airway protection and is at high aspiration risk given SAH, intubation and current lethargy.  Recommend continue NPO with oral care and follow up next date for safety to initiate po's versus objective assessment.      Aspiration Risk  Severe    Diet Recommendation NPO (small ice chip after oral care)   Medication Administration: Via alternative means    Other  Recommendations Oral Care Recommendations: Oral care Q4 per protocol   Follow Up Recommendations  Inpatient Rehab    Frequency and Duration min 2x/week  2 weeks   Pertinent Vitals/Pain WDL         Swallow Study         Oral/Motor/Sensory Function Overall Oral Motor/Sensory Function:  (general weakness and decreased ROM)   Ice Chips Ice chips: Impaired Presentation: Spoon Pharyngeal Phase Impairments: Throat Clearing - Immediate    Thin Liquid Thin Liquid: Impaired Presentation: Cup;Spoon Pharyngeal  Phase Impairments: Cough - Delayed;Throat Clearing - Immediate;Suspected delayed Swallow    Nectar Thick Nectar Thick Liquid: Not tested   Honey Thick Honey Thick Liquid: Not tested   Puree Puree: Impaired Pharyngeal Phase Impairments: Suspected delayed Swallow   Solid   GO    Solid: Not tested       Royce MacadamiaLisa Willis Fleeta Kunde M.Ed [employer] Pager [phone]  12/27/2013

## 2013-12-27 NOTE — Progress Notes (Signed)
Subjective: Started keppra empirically for staring spell, though not clear that seizures are occurring.   Continues to be encephalopathic.   Exam: Filed Vitals:   12/27/13 0733  BP:   Pulse:   Temp: 98 F (36.7 C)  Resp:    Gen: In bed, NAD MS: Awakens to minor stimuli, follows commands in all four extremities. Able to give year, but not place or month.  GM:WNUUVCN:PERRL, does cross midline both directions Motor: withdraws all extermities to noxious stimuli and follows commands x 4.  Sensory: as above.   Impression: 68 yo M with CHI and AMS. AMS is improving, likely due to combination of TBI and respiratory failure. His TBI could be further assessed with MRI, but given his agitation, I do not think it would be feasible without a lot of sedation. I do not feel strongly enough about it at this time to push for it   Recommendations: 1) continue keppra, if no clear seizures, would stop 1 week from date of injury 2) will continue to follow.   Ritta SlotMcNeill Rashied Corallo, MD [facility] [phone]  If 7pm- 7am, please page neurology on call at [phone].

## 2013-12-27 NOTE — Evaluation (Signed)
Speech Language Pathology Evaluation Patient Details Name: Murlean IbaWalter Meter MRN: [id number] DOB: 04/24/1946 Today's Date: 12/27/2013 Time: 1211-1225 SLP Time Calculation (min): 14 min  Problem List:  Patient Active Problem List   Diagnosis Date Noted  . Acute respiratory failure 12/25/2013  . Altered mental status 12/25/2013  . Hypercarbia 12/25/2013  . Morbid obesity 12/25/2013  . SAH (subarachnoid hemorrhage) 12/24/2013   Past Medical History:  Past Medical History  Diagnosis Date  . PVD (peripheral vascular disease)    Past Surgical History:  Past Surgical History  Procedure Laterality Date  . Cyst removal trunk     HPI:  68 y.o. male with no significant PMH who presents after a fall.  Per wife she heard a noise, she then found her husband lying in the stair. He fell probably from 8 to 10 steps of stair. He was breathing, but unconscious. She report that he has been congested for last few days.  CT Persistent changes of small subarachnoid hemorrhage in the left posterior parietal vertex,  Persistent subdural hematoma along the falx and tentorium, Falcine subdural hematoma has increased slightly in size. No prominent mass effect.  Intubated 2 days.    Assessment / Plan / Recommendation Clinical Impression  Pt. demonstrated behaviors of a Rancho V (confused; non agitated; inappropriate) and some abilities of a level VI (confused; appropriate) during assessment.  Pt. also lethargic from Haldol administer earlier (maybe signs of Rancho IV at various times).  He exhibits cognitive deficits in all areas and responds in one word or 2-3 word phrases.  Comprehension functional for most basic one step commands.  SLP will continue therapy to facilitate communicative-cognitive functioning with least assistance necessary.     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[employer])   Cognition  Overall Cognitive Status: Impaired/Different from baseline Arousal/Alertness: Lethargic Orientation Level: Oriented to person;Disoriented to place;Disoriented to time;Disoriented to situation Attention: Sustained Sustained Attention: Impaired Sustained Attention Impairment: Verbal basic Memory:  (TBA) Awareness: Impaired Awareness Impairment: Intellectual impairment;Emergent impairment;Anticipatory impairment Problem Solving: Impaired Problem Solving Impairment: Verbal basic Safety/Judgment: Impaired [place] [address] Scales of Cognitive Functioning: Confused/inappropriate/non-agitated (some behaviors present of level VI)    Comprehension  Auditory Comprehension Overall Auditory Comprehension: Impaired Yes/No Questions: Impaired Basic Biographical Questions: 51-75% accurate Basic Immediate Environment Questions: 25-49% accurate Commands: Impaired One Step Basic Commands: 50-74% accurate Interfering Components: Attention;Processing speed;Working Radio broadcast assistantmemory EffectiveTechniques: Barrister's clerkxtra processing time Visual Recognition/Discrimination Discrimination: Not tested Reading Comprehension Reading Status:  (TBA)    Expression Expression Primary Mode of Expression: Verbal Verbal Expression Overall Verbal Expression: Impaired Initiation: Impaired Level of Generative/Spontaneous Verbalization: Word;Phrase Repetition:  (TBA) Naming: Impairment (will further assess) Verbal Errors: Semantic paraphasias Pragmatics: Impairment Impairments: Eye contact;Abnormal affect Interfering Components: Attention Written Expression Dominant Hand: Right Written Expression:  (TBA)   Oral / Motor Oral Motor/Sensory Function Overall Oral  Motor/Sensory Function:  (general weakneses and decreased ROM) Motor Speech Overall Motor Speech: Impaired Respiration: Impaired Level of Impairment: Phrase Phonation: Low vocal intensity Resonance: Within functional limits Articulation: Within functional limitis Intelligibility: Intelligibility reduced Word: 50-74% accurate Phrase: 50-74% accurate Motor Planning: Witnin functional limits   GO     Misty StanleyLisa  Anne Hahn [employer].Ed [employer] [phone]  12/27/2013

## 2013-12-27 NOTE — Progress Notes (Signed)
Patient ID: Maurice Paul, male   DOB: 03/10/1946, 68 y.o.   MRN: [id number] Remains mildly agitated but moves all extremities well. Remains confused. No new recommendations

## 2013-12-27 NOTE — Progress Notes (Signed)
Notified Dr Delford FieldWright of pt decreased UOP. Orders received. Barbera Settersurner, Kamilya Wakeman B RN

## 2013-12-27 NOTE — Progress Notes (Addendum)
PULMONARY / CRITICAL CARE MEDICINE   Name: Maurice Paul MRN: [id number] DOB: 11/17/1945    ADMISSION DATE:  12/24/2013 CONSULTATION DATE:  12/25/2013  REFERRING MD :  Rito EhrlichKrishnan PRIMARY SERVICE: PCCM  CHIEF COMPLAINT:  SAH  BRIEF PATIENT DESCRIPTION: 68 year old male with admitted 3/18 with traumatic SAH and SDH. 3/19 mental and respiratory status decompensated and PCCM has been asked to assist with his management.   SIGNIFICANT EVENTS / STUDIES:  3/18 CT head- Small SAH Left posterior parietal, Persistent SDH along falx and tentorium. 3/19 CT head- No change in amount of intracranial blood, no hydrocephalus.   LINES / TUBES: ETT 3/19 >> 3/20 [facility] 3/19 >>  CULTURES: None  ANTIBIOTICS: None  SUBJECTIVE: Unable to do MRI since agitated overnight, required restraints afebrile  VITAL SIGNS: Temp:  [97.3 F (36.3 C)-98.8 F (37.1 C)] 98 F (36.7 C) (03/21 0733) Pulse Rate:  [25-134] 55 (03/21 0600) Resp:  [16-32] 17 (03/21 0600) BP: (86-153)/(39-97) 130/59 mmHg (03/21 0600) SpO2:  [89 %-99 %] 98 % (03/21 0600) FiO2 (%):  [40 %] 40 % (03/20 1200)  HEMODYNAMICS: CVP:  [0 mmHg-39 mmHg] 1 mmHg  VENTILATOR SETTINGS: Vent Mode:  [-] PSV FiO2 (%):  [40 %] 40 % PEEP:  [5 cmH20-8 cmH20] 5 cmH20 Pressure Support:  [5 cmH20] 5 cmH20  INTAKE / OUTPUT: Intake/Output     03/20 0701 - 03/21 0700 03/21 0701 - 03/22 0700   I.V. (mL/kg) 576.4 (4.3)    IV Piggyback 549    Total Intake(mL/kg) 1125.4 (8.3)    Urine (mL/kg/hr) 2300 (0.7)    Total Output 2300     Net -1174.6          Urine Occurrence 1 x     PHYSICAL EXAMINATION: General:  Chronically ill appearing, arousable. Neuro:  Awake, follows some commands , able to hold conversation, oriented x 2 HEENT:  [place]/AT. Cardiovascular:  RRR, Nl S1/S2, -M/R/G. Lungs:  Diminished bilateral. Abdomen:  Obese, soft non-distended Musculoskeletal:  No acute deformity Skin:  Intact.  LABS:  CBC  Recent Labs Lab 12/25/13 0400  12/26/13 0320 12/27/13 0621  WBC 12.9* 11.6* 11.1*  HGB 16.3 15.0 15.7  HCT 49.7 45.9 47.8  PLT 179 157 164   Coag's  Recent Labs Lab 12/24/13 0520  APTT 28  INR 1.10   BMET  Recent Labs Lab 12/24/13 0420 12/25/13 0400 12/26/13 0320  NA 138 140 144  K 4.0 4.6 3.7  CL 95* 98 100  CO2 26 31 33*  BUN 7 6 17   CREATININE 0.71 0.71 0.85  GLUCOSE 162* 170* 145*   Electrolytes  Recent Labs Lab 12/24/13 0420 12/25/13 0400 12/26/13 0320  CALCIUM 8.8 8.5 8.4  MG  --   --  2.2  PHOS  --   --  0.5*   Sepsis Markers No results found for this basename: LATICACIDVEN, PROCALCITON, O2SATVEN,  in the last 168 hours ABG  Recent Labs Lab 12/25/13 1123 12/26/13 0340 12/26/13 1610  PHART 7.372 7.445 7.386  PCO2ART 60.3* 49.1* 60.2*  PO2ART 106.0* 65.0* 70.0*   Liver Enzymes No results found for this basename: AST, ALT, ALKPHOS, BILITOT, ALBUMIN,  in the last 168 hours Cardiac Enzymes  Recent Labs Lab 12/25/13 0400  PROBNP 577.4*   Glucose  Recent Labs Lab 12/26/13 0738 12/26/13 1241 12/26/13 1607 12/26/13 2019 12/27/13 0007 12/27/13 0331  GLUCAP 129* 173* 158* 141* 143* 130*    Imaging Dg Chest Port 1 View  12/26/2013  CLINICAL DATA:  Endotracheal tube  EXAM: PORTABLE CHEST - 1 VIEW  COMPARISON:  DG CHEST 1V PORT dated 12/25/2013; DG CHEST 1V PORT dated 12/25/2013; DG CHEST 1V PORT dated 12/24/2013  FINDINGS: Grossly unchanged enlarged cardiac silhouette and mediastinal contours given persistently reduced lung volumes and patient rotation. Pulmonary venous congestion without definite evidence of edema. Grossly unchanged bibasilar heterogeneous opacities, right greater than left. Small / trace bilateral effusions are grossly unchanged. Unchanged bones.  IMPRESSION: 1.  Stable positioning of support apparatus.  No pneumothorax. 2. Mild pulmonary venous congestion without definite evidence of edema. 3. Persistently reduced lung volumes with grossly unchanged  bibasilar opacities, atelectasis versus infiltrate.   Electronically Signed   By: Simonne Come M.D.   On: 12/26/2013 07:54   Dg Chest Port 1 View  12/25/2013   CLINICAL DATA:  Central line placement  EXAM: PORTABLE CHEST - 1 VIEW  COMPARISON:  12/25/2013  FINDINGS: Cardiomegaly again noted. There is probable small right pleural effusion with hazy right basilar atelectasis or infiltrate. Endotracheal tube in place with tip 3.4 cm above the carina. Left IJ central line with tip in SVC. No diagnostic pneumothorax.  IMPRESSION: There is probable small right pleural effusion with hazy right basilar atelectasis or infiltrate. Endotracheal tube in place with tip 3.4 cm above the carina. Left IJ central line with tip in SVC. No diagnostic pneumothorax.   Electronically Signed   By: Natasha Mead M.D.   On: 12/25/2013 11:37   CXR: Bibasilar infiltrates  ASSESSMENT / PLAN:  PULMONARY A: Acute Respiratory Failure due to Encephalopathy secondary to [facility]  Compensated hypercarbia P:   -O2 [place]  CARDIOVASCULAR A:  BP management in setting of SAH  P:  - Keep SBP < 140 mm/Hg. - PRN hydralazine.  RENAL A:  Hypokalemia/hypophos - repleted P:   - hold lasix - Monitor BMP.  GASTROINTESTINAL A:  No acute issues P:   - PPI for Gi ppx   HEMATOLOGIC A:  No acute issues P:  - Monitor CBC, coags.  INFECTIOUS A:   Leukocytosis - thick yellow secretions noted during intubation  P:   - Monitor off ABX. - Trend CBC and fever curve.  ENDOCRINE A:   Hyperglycemia  P:   - Tight glycemic control. - CBG q4 hours while intubated. - SSI.  NEUROLOGIC A:   SAH - Small SAH Left posterior parietal,   SDH -  along falx and tentorium. Acute Encephalopathy , EEG neg for seizure  P:   - Neurology following. - Goal RASS 0. -Defer need for  MRI today to neuro since mental status improved. -Use Haldol prn, Precedex limited by BP -ct keppra  TODAY'S SUMMARY: MRI per neuro  If agitation can be  controlled, Unclear if this will add to management. Needs to stay in ICU until mental status improved  I have personally obtained a history, examined the patient, evaluated laboratory and imaging results, formulated the assessment and plan and placed orders.  CRITICAL CARE: The patient is critically ill with multiple organ systems failure and requires high complexity decision making for assessment and support, frequent evaluation and titration of therapies, application of advanced monitoring technologies and extensive interpretation of multiple databases. Critical Care Time devoted to patient care services described in this note is 32 minutes.   Cyril Mourning MD. Tonny Bollman. [place] [facility] Pager [phone] If no response call [phone]   12/27/2013, 7:36 AM

## 2013-12-28 ENCOUNTER — Encounter (HOSPITAL_COMMUNITY): Payer: Self-pay

## 2013-12-28 DIAGNOSIS — S069XAA Unspecified intracranial injury with loss of consciousness status unknown, initial encounter: Secondary | ICD-10-CM

## 2013-12-28 DIAGNOSIS — S066XAA Traumatic subarachnoid hemorrhage with loss of consciousness status unknown, initial encounter: Secondary | ICD-10-CM

## 2013-12-28 DIAGNOSIS — S069X9A Unspecified intracranial injury with loss of consciousness of unspecified duration, initial encounter: Secondary | ICD-10-CM

## 2013-12-28 DIAGNOSIS — S066X9A Traumatic subarachnoid hemorrhage with loss of consciousness of unspecified duration, initial encounter: Secondary | ICD-10-CM

## 2013-12-28 LAB — GLUCOSE, CAPILLARY
GLUCOSE-CAPILLARY: 120 mg/dL — AB (ref 70–99)
GLUCOSE-CAPILLARY: 115 mg/dL — AB (ref 70–99)
GLUCOSE-CAPILLARY: 131 mg/dL — AB (ref 70–99)
GLUCOSE-CAPILLARY: 121 mg/dL — AB (ref 70–99)
Glucose-Capillary: 101 mg/dL — ABNORMAL HIGH (ref 70–99)
Glucose-Capillary: 118 mg/dL — ABNORMAL HIGH (ref 70–99)

## 2013-12-28 LAB — CBC
HEMATOCRIT: 47.4 % (ref 39.0–52.0)
Hemoglobin: 15.4 g/dL (ref 13.0–17.0)
MCH: 33.7 pg (ref 26.0–34.0)
MCHC: 32.5 g/dL (ref 30.0–36.0)
MCV: 103.7 fL — ABNORMAL HIGH (ref 78.0–100.0)
PLATELETS: 182 10*3/uL (ref 150–400)
RBC: 4.57 MIL/uL (ref 4.22–5.81)
RDW: 13.8 % (ref 11.5–15.5)
WBC: 10.2 10*3/uL (ref 4.0–10.5)

## 2013-12-28 LAB — BASIC METABOLIC PANEL
BUN: 14 mg/dL (ref 6–23)
CHLORIDE: 102 meq/L (ref 96–112)
CO2: 34 mEq/L — ABNORMAL HIGH (ref 19–32)
CREATININE: 0.64 mg/dL (ref 0.50–1.35)
Calcium: 8.2 mg/dL — ABNORMAL LOW (ref 8.4–10.5)
GFR calc non Af Amer: >90 mL/min (ref >90)
Glucose, Bld: 121 mg/dL — ABNORMAL HIGH (ref 70–99)
Potassium: 3.6 mEq/L — ABNORMAL LOW (ref 3.7–5.3)
Sodium: 146 mEq/L (ref 137–147)

## 2013-12-28 MED ORDER — CHLORHEXIDINE GLUCONATE 0.12 % MT SOLN
15.0000 mL | Freq: Two times a day (BID) | OROMUCOSAL | Status: DC
  Administered 2013-12-28 – 2014-01-02 (×9): 15 mL via OROMUCOSAL
  Filled 2013-12-28 (×13): qty 15

## 2013-12-28 MED ORDER — SODIUM CHLORIDE 0.9 % IV SOLN
500.0000 mg | Freq: Once | INTRAVENOUS | Status: AC
  Administered 2013-12-28: 500 mg via INTRAVENOUS
  Filled 2013-12-28: qty 5

## 2013-12-28 MED ORDER — SODIUM CHLORIDE 0.9 % IV SOLN
1000.0000 mg | Freq: Two times a day (BID) | INTRAVENOUS | Status: DC
  Administered 2013-12-28 – 2013-12-30 (×4): 1000 mg via INTRAVENOUS
  Filled 2013-12-28 (×6): qty 10

## 2013-12-28 MED ORDER — BIOTENE DRY MOUTH MT LIQD
15.0000 mL | Freq: Two times a day (BID) | OROMUCOSAL | Status: DC
  Administered 2013-12-28 – 2014-01-02 (×9): 15 mL via OROMUCOSAL

## 2013-12-28 NOTE — Progress Notes (Addendum)
PULMONARY / CRITICAL CARE MEDICINE   Name: Maurice Paul MRN: [id number] DOB: Jun 13, 1946    ADMISSION DATE:  12/24/2013 CONSULTATION DATE:  12/25/2013  REFERRING MD :  [name] PRIMARY SERVICE: PCCM  CHIEF COMPLAINT:  SAH  BRIEF PATIENT DESCRIPTION: 68 year old male with admitted 3/18 with traumatic SAH and SDH. 3/19 mental and respiratory status decompensated and PCCM has been asked to assist with his management.   SIGNIFICANT EVENTS / STUDIES:  3/18 CT head- Small SAH Left posterior parietal, Persistent SDH along falx and tentorium. 3/19 CT head- No change in amount of intracranial blood, no hydrocephalus.  3/21 MRI >>subarachnoid blood over the vertex and within the interhemispheric fissure   LINES / TUBES: ETT 3/19 >> 3/20 [facility] 3/19 >>  CULTURES: None  ANTIBIOTICS: None  SUBJECTIVE: Remains agitated overnight, required restraints Afebrile Wife at bedside  VITAL SIGNS: Temp:  [97.9 F (36.6 C)-98.6 F (37 C)] 98.6 F (37 C) (03/22 0733) Pulse Rate:  [55-86] 77 (03/22 0830) Resp:  [15-25] 25 (03/22 1004) BP: (91-171)/(42-143) 150/72 mmHg (03/22 1004) SpO2:  [91 %-99 %] 94 % (03/22 0830)  HEMODYNAMICS: CVP:  [9 mmHg] 9 mmHg  VENTILATOR SETTINGS:    INTAKE / OUTPUT: Intake/Output     03/21 0701 - 03/22 0700 03/22 0701 - 03/23 0700   I.V. (mL/kg) 1178.8 (8.7) 150 (1.1)   IV Piggyback 260    Total Intake(mL/kg) 1438.8 (10.6) 150 (1.1)   Urine (mL/kg/hr) 1275 (0.4)    Total Output 1275     Net +163.8 +150         PHYSICAL EXAMINATION: General:  Chronically ill appearing, arousable. Neuro:  Awake, follows some commands , able to hold conversation, oriented x 2 HEENT:  [place]/AT. Cardiovascular:  RRR, Nl S1/S2, -M/R/G. Lungs:  Diminished bilateral. Abdomen:  Obese, soft non-distended Musculoskeletal:  No acute deformity Skin:  Intact.  LABS:  CBC  Recent Labs Lab 12/26/13 0320 12/27/13 0621 12/28/13 0555  WBC 11.6* 11.1* 10.2  HGB 15.0 15.7 15.4   HCT 45.9 47.8 47.4  PLT 157 164 182   Coag's  Recent Labs Lab 12/24/13 0520  APTT 28  INR 1.10   BMET  Recent Labs Lab 12/26/13 0320 12/27/13 0621 12/28/13 0555  NA 144 146 146  K 3.7 3.4* 3.6*  CL 100 99 102  CO2 33* 35* 34*  BUN 17 17 14   CREATININE 0.85 0.70 0.64  GLUCOSE 145* 121* 121*   Electrolytes  Recent Labs Lab 12/26/13 0320 12/27/13 0621 12/28/13 0555  CALCIUM 8.4 8.2* 8.2*  MG 2.2 2.3  --   PHOS 0.5* 2.4  --    Sepsis Markers No results found for this basename: LATICACIDVEN, PROCALCITON, O2SATVEN,  in the last 168 hours ABG  Recent Labs Lab 12/25/13 1123 12/26/13 0340 12/26/13 1610  PHART 7.372 7.445 7.386  PCO2ART 60.3* 49.1* 60.2*  PO2ART 106.0* 65.0* 70.0*   Liver Enzymes No results found for this basename: AST, ALT, ALKPHOS, BILITOT, ALBUMIN,  in the last 168 hours Cardiac Enzymes  Recent Labs Lab 12/25/13 0400  PROBNP 577.4*   Glucose  Recent Labs Lab 12/27/13 0901 12/27/13 1122 12/27/13 1600 12/27/13 1948 12/28/13 0417 12/28/13 0731  GLUCAP 122* 125* 120* 142* 120* 115*    Imaging Mr Brain Wo Contrast  12/27/2013   CLINICAL DATA:  Traumatic brain injury.  EXAM: MRI HEAD WITHOUT CONTRAST  TECHNIQUE: Multiplanar, multiecho pulse sequences of the brain and surrounding structures were obtained without intravenous contrast.  COMPARISON:  Head  CT 12/25/2013 and previous  FINDINGS: The study suffers from motion degradation. Because of those, the diagnostic utility is compromised. Subarachnoid hemorrhage remains evident over the convexity and in the interhemispheric fissure. There does not appear to be any focal intraparenchymal hematoma or focal subdural hematoma. No shift. No hydrocephalus. I do not see an ischemic infarction.  IMPRESSION: Marked motion degradation. MR also shows evidence of subarachnoid blood over the vertex and within the interhemispheric fissure. No detectable intraparenchymal hemorrhage or subdural hematoma.  No hydrocephalus or shift.   Electronically Signed   By: Paulina FusiMark  Shogry M.D.   On: 12/27/2013 18:02   CXR: Bibasilar infiltrates  ASSESSMENT / PLAN:  PULMONARY A: Acute Respiratory Failure [facility]  Compensated hypercarbia P:   -O2 [place]  CARDIOVASCULAR A:  BP management in setting of SAH  P:  - Keep SBP < 140 mm/Hg. - PRN hydralazine.  RENAL A:  Hypokalemia/hypophos - repleted P:   - hold lasix - Monitor BMP.  GASTROINTESTINAL A:  No acute issues P:   - PPI for Gi ppx   HEMATOLOGIC A:  No acute issues P:  - Monitor CBC, coags.  INFECTIOUS A:   Leukocytosis - thick yellow secretions noted during intubation  P:   - Monitor off ABX. - Trend CBC and fever curve.  ENDOCRINE A:   Hyperglycemia  P:   - Tight glycemic control. - SSI.  NEUROLOGIC A:   SAH - Small SAH Left posterior parietal,   SDH -  along falx and tentorium -not seen on MRI Acute Encephalopathy , EEG neg for seizure  P:   - Neurology following. - Goal RASS 0. -Use Haldol prn, Precedex limited by BP-can use low dose -ct keppra -If clears swallow, can add low dose risperdal  Updated wife  TODAY'S SUMMARY: may need to add antipsychotics to control agitation -Needs to stay in ICU until mental status improved  I have personally obtained a history, examined the patient, evaluated laboratory and imaging results, formulated the assessment and plan and placed orders.  CRITICAL CARE: The patient is critically ill with multiple organ systems failure and requires high complexity decision making for assessment and support, frequent evaluation and titration of therapies, application of advanced monitoring technologies and extensive interpretation of multiple databases. Critical Care Time devoted to patient care services described in this note is 32 minutes.   Cyril Mourningakesh Alva MD. Tonny BollmanFCCP. [place] [facility] Pager [phone] If no response call [phone]    12/28/2013, 10:14 AM

## 2013-12-28 NOTE — Progress Notes (Signed)
Speech Language Pathology Treatment: Dysphagia  Patient Details Name: Maurice Paul MRN: [id number] DOB: 05/25/1946 Today's Date: 12/28/2013 Time: [id number] SLP Time Calculation (min): 18 min  Assessment / Plan / Recommendation Clinical Impression  F/u from initial BSE to reassess swallow to determine PO readiness.   LOA brief.   Patient able to complete volitional swallows with adequate hyoid laryngeal elevation but unable to maintain alertness for PO trials.  Attempted ice chips but eventually had to suction out of oral cavity.  Recommend to continue NPO status mainly due to decreased LOA and severity of cognitive deficits increasing risk for aspiration.  ST to f/u on 12/01/13.     HPI HPI: 68 y.o. male with no significant PMH who presents after a fall.  Per wife she heard a noise, she then found her husband lying in the stair. He fell probably from 8 to 10 steps of stair. He was breathing, but unconscious. She report that he has been congested for last few days.  CT Persistent changes of small subarachnoid hemorrhage in the left posterior parietal vertex,  Persistent subdural hematoma along the falx and tentorium, Falcine subdural hematoma has increased slightly in size. No prominent mass effect.  Intubated 2 days.       SLP Plan  Continue with current plan of care    Recommendations Diet recommendations: NPO Medication Administration: Via alternative means              General recommendations: Rehab consult Oral Care Recommendations: Oral care Q4 per protocol Follow up Recommendations: Inpatient Rehab Plan: Continue with current plan of care    GO    Moreen FowlerKaren Akashdeep Chuba MS, CCC-SLP [phone] [facility] Rhode IslandDANKOF,Freeman Borba 12/28/2013, 3:48 PM

## 2013-12-28 NOTE — Evaluation (Signed)
Physical Therapy Evaluation Patient Details Name: Maurice Paul MRN: [id number] DOB: October 21, 1945 Today's Date: 12/28/2013 Time: 4098-1191 PT Time Calculation (min): 18 min  PT Assessment / Plan / Recommendation History of Present Illness  Pt sustained fall at home resulting [place] [facility] [place],  pt intubated for 2 days.  possibly experiencing mulifactorial delirium in form of TBI due to respiratory status.   Clinical Impression  Pt adm due to the above. Presents with TBI like symptoms consistent with ranchos level IV emerging V, after fall down steps at home. Pt very agitated and lethargic today so evaluation was limited to bed mobility for safety. Pt is arrousable to tactile cues. Pt also able to respond to one step commands with increased time. His ability to answer questions is inconsistent. Pt to benefit from skilled acute PT to address deficits listed below and maximize mobility. PTA pt was independent with mobility and ADLs, per wife. Pt has good family support and would be great candidate for CIR to maximize independence prior to returning home with wife.    PT Assessment  Patient needs continued PT services    Follow Up Recommendations  CIR    Does the patient have the potential to tolerate intense rehabilitation      Barriers to Discharge Inaccessible home environment      Equipment Recommendations  Other (comment) (TBD)    Recommendations for Other Services Rehab consult;OT consult   Frequency Min 4X/week    Precautions / Restrictions Precautions Precautions: Fall Precaution Comments: pt with mitt restraints and waist restraints  Restrictions Weight Bearing Restrictions: No   Pertinent Vitals/Pain Vitals stable t/o session     Mobility  Bed Mobility Overal bed mobility: Needs Assistance;+ 2 for safety/equipment Bed Mobility: Rolling Rolling: +2 for safety/equipment;Min assist General bed mobility comments: pt rolled in both directions and was repositioned  for comfort and safety; pt throwing LEs off EOB on Rt side throughout session; pt demo decreased safety awareness and was attempting to pull at lines; maintained mitts throughout session; pt reposition in [place] sidelying at end of session to attempt to protect pt from throwing LEs off Rt side of bed; pt responded to bed mobility cues with increased time  Transfers General transfer comment: will need 2, possibly 3 for OOB mobility if behaviors persists; unable to asses mobility due to agitation level at  this time           PT Diagnosis: Difficulty walking;Altered mental status  PT Problem List: Decreased mobility;Decreased balance;Decreased activity tolerance;Decreased cognition;Decreased safety awareness;Decreased knowledge of use of DME PT Treatment Interventions: Gait training;DME instruction;Functional mobility training;Therapeutic activities;Stair training;Therapeutic exercise;Balance training;Neuromuscular re-education;Patient/family education     PT Goals(Current goals can be found in the care plan section) Acute Rehab PT Goals Patient Stated Goal: none stated; wife's goal is for pt to come home  PT Goal Formulation: With patient/family Time For Goal Achievement: 01/11/14 Potential to Achieve Goals: Good  Visit Information  Last PT Received On: 12/28/13 Assistance Needed: +2 (possibly 3) History of Present Illness: Pt sustained fall at home resulting in [facility] [place],  pt intubated for 2 days.  possibly experiencing mulifactorial delirium in form of TBI due to respiratory status.        Prior Functioning  Home Living Family/patient expects to be discharged to:: Private residence Living Arrangements: Spouse/significant other Available Help at Discharge: Family;Available 24 hours/day Type of Home: House Home Access: Stairs to enter [employer] of Steps: 4-6 Entrance Stairs-Rails: Can  reach both;Left;Right Home Layout: Two level;Able to live on main level  with bedroom/bathroom Home Equipment: None Additional Comments: wife available for PLOF and home setup information   Lives With: Spouse Prior Function Level of Independence: Independent Communication Communication: Expressive difficulties Dominant Hand: Right    Cognition  Cognition Arousal/Alertness: Lethargic;Suspect due to medications Behavior During Therapy: Restless Overall Cognitive Status: Impaired/Different from baseline Area of Impairment: Orientation;Attention;Safety/judgement;Following commands;Problem solving;Awareness Orientation Level: Disoriented to;Place;Situation;Time Current Attention Level: Focused Following Commands: Follows one step commands with increased time Safety/Judgement: Decreased awareness of deficits;Decreased awareness of safety Awareness: Emergent Problem Solving: Slow processing;Decreased initiation;Difficulty sequencing;Requires verbal cues;Requires tactile cues General Comments: pt able to perform bed mobility with incr time and cues; opens his eyes and responds to consistently to stimuli and appropriately to questions ~ 50% of the time  Rancho Levels of Cognitive Functioning [place] Mirant[place] Amigos Scales of Cognitive Functioning: Confused/inappropriate/non-agitated (possibly level IV emerging V )    Extremity/Trunk Assessment Upper Extremity Assessment Upper Extremity Assessment: Defer to OT evaluation Lower Extremity Assessment Lower Extremity Assessment: Difficult to assess due to impaired cognition Cervical / Trunk Assessment Cervical / Trunk Assessment: Normal   Balance Balance Overall balance assessment: History of Falls General Comments General comments (skin integrity, edema, etc.): bruising noted on Rt UE; pt responds to light touch and was able to verbalize proper location when testing sensation of LE;  pt flailing arms throughout session   End of Session PT - End of Session Equipment Utilized During Treatment: Oxygen Activity Tolerance:  Treatment limited secondary to agitation Patient left: in bed;with restraints reapplied Nurse Communication: Mobility status;Precautions  GP     Donell SievertWest, Joe Gee N, South CarolinaPT [phone] 12/28/2013, 3:00 PM

## 2013-12-28 NOTE — Progress Notes (Signed)
Subjective: Ctoninues to wax and wane. I suspect delirium.   Exam: Filed Vitals:   12/28/13 0830  BP: 171/143  Pulse: 77  Temp:   Resp: 20   Gen: In bed, NAD MS: Awakens to minor stimuli, follows commands in all four extremities. Able to give year, but not place or month.  ZO:XWRUECN:PERRL, does cross midline both directions Motor: withdraws all extermities to noxious stimuli and follows commands x 4.  Sensory: as above.   MRI limited by motion, but does not show any significant change.   Impression: 68 yo M with CHI and AMS. I suepct multifactorial delirium in teh setting of TBI and respiratory failure. With waxing and waning, suspect delirium, but will increase keppra today, if continues, liekly will need continuous EEG.   Recommendations: 1) will increase keppra to 1gm BID. 2) will continue to follow.   Ritta SlotMcNeill Ezrah Panning, MD [facility] [phone]  If 7pm- 7am, please page neurology on call at [phone].

## 2013-12-29 ENCOUNTER — Inpatient Hospital Stay (HOSPITAL_COMMUNITY): Payer: Medicare Other

## 2013-12-29 LAB — GLUCOSE, CAPILLARY
GLUCOSE-CAPILLARY: 111 mg/dL — AB (ref 70–99)
GLUCOSE-CAPILLARY: 125 mg/dL — AB (ref 70–99)
GLUCOSE-CAPILLARY: 139 mg/dL — AB (ref 70–99)
Glucose-Capillary: 114 mg/dL — ABNORMAL HIGH (ref 70–99)
Glucose-Capillary: 125 mg/dL — ABNORMAL HIGH (ref 70–99)
Glucose-Capillary: 131 mg/dL — ABNORMAL HIGH (ref 70–99)

## 2013-12-29 MED ORDER — BIOTENE DRY MOUTH MT LIQD
15.0000 mL | Freq: Two times a day (BID) | OROMUCOSAL | Status: DC
  Administered 2013-12-29 – 2014-01-02 (×5): 15 mL via OROMUCOSAL

## 2013-12-29 MED ORDER — POTASSIUM CHLORIDE CRYS ER 20 MEQ PO TBCR
40.0000 meq | EXTENDED_RELEASE_TABLET | Freq: Three times a day (TID) | ORAL | Status: AC
  Administered 2013-12-29: 40 meq via ORAL
  Filled 2013-12-29 (×2): qty 2

## 2013-12-29 MED ORDER — HYDRALAZINE HCL 20 MG/ML IJ SOLN
10.0000 mg | INTRAMUSCULAR | Status: AC
  Administered 2013-12-29: 10 mg via INTRAVENOUS

## 2013-12-29 MED ORDER — RISPERIDONE 0.25 MG PO TABS
0.2500 mg | ORAL_TABLET | Freq: Every day | ORAL | Status: DC
  Administered 2013-12-29 – 2014-01-01 (×3): 0.25 mg via ORAL
  Filled 2013-12-29 (×5): qty 1

## 2013-12-29 MED ORDER — CARVEDILOL 3.125 MG PO TABS
3.1250 mg | ORAL_TABLET | Freq: Two times a day (BID) | ORAL | Status: DC
  Administered 2013-12-29 – 2014-01-04 (×6): 3.125 mg via ORAL
  Filled 2013-12-29 (×18): qty 1

## 2013-12-29 NOTE — Progress Notes (Signed)
Utilization review completed.  

## 2013-12-29 NOTE — Progress Notes (Signed)
Physical Therapy Treatment Patient Details Name: Maurice Paul MRN: [id number] DOB: 10/18/1945 Today's Date: 12/29/2013 Time: 7829-5621 PT Time Calculation (min): 54 min  PT Assessment / Plan / Recommendation  History of Present Illness Pt sustained fall at home resulting in [facility] [place],  pt intubated for 2 days.  possibly experiencing mulifactorial delirium in form of TBI due to respiratory status.    PT Comments   Pt with less agitation this date compared to yesterday with increased ability to follow commands however remains to have confusion and both cognitive and functional deficits. Pt demo's delayed response time, inability to sequence for transfers or to take steps. Pt demo's characteristics of Rancho Level V, confused/inappropriate. There is potential that patient might demo more characteristics of Rancho Level IV if pt wasn't on sedation. Con't to recommend CIR.   Follow Up Recommendations  CIR     Does the patient have the potential to tolerate intense rehabilitation     Barriers to Discharge        Equipment Recommendations   (TBD)    Recommendations for Other Services Rehab consult;OT consult  Frequency Min 4X/week   Progress towards PT Goals Progress towards PT goals: Progressing toward goals  Plan Current plan remains appropriate    Precautions / Restrictions Precautions Precautions: Fall Precaution Comments: pt with mitt restraints and waist restraints  Restrictions Weight Bearing Restrictions: No   Pertinent Vitals/Pain Reports of mild headache, BP 113/52 in sitting    Mobility  Bed Mobility Overal bed mobility: Needs Assistance;+ 2 for safety/equipment Bed Mobility: Rolling Rolling: +2 for safety/equipment;Min assist Supine to sit: Max assist;+2 for physical assistance Sit to supine: Max assist;+2 for physical assistance General bed mobility comments: pt rolled in both directions and was repositioned for comfort and safety; pt throwing LEs off  EOB on Rt side throughout session; pt demo decreased safety awareness and was attempting to pull at lines; maintained mitts throughout session; pt reposition in Lt sidelying at end of session to attempt to protect pt from throwing LEs off Rt side of bed; pt responded to bed mobility cues with increased time  Transfers Overall transfer level: Needs assistance Equipment used:  (2 person lift) Transfers: Sit to/from Stand Sit to Stand: Max assist;+2 physical assistance General transfer comment: will need 2, possibly 3 for OOB mobility if behaviors persists; unable to asses mobility due to agitation level at  this time   Ambulation/Gait Ambulation/Gait assistance:  (not yet appropriate)    Exercises     PT Diagnosis:    PT Problem List:   PT Treatment Interventions:     PT Goals (current goals can now be found in the care plan section) Acute Rehab PT Goals Patient Stated Goal: none stated; wife's goal is for pt to come home  PT Goal Formulation: With patient/family  Visit Information  Last PT Received On: 12/29/13 Assistance Needed: +2 (possibly 3) PT/OT/SLP Co-Evaluation/Treatment: Yes Reason for Co-Treatment: Complexity of the patient's impairments (multi-system involvement);Necessary to address cognition/behavior during functional activity;For patient/therapist safety PT goals addressed during session: Mobility/safety with mobility OT goals addressed during session: ADL's and self-care History of Present Illness: Pt sustained fall at home resulting in [facility] [place],  pt intubated for 2 days.  possibly experiencing mulifactorial delirium in form of TBI due to respiratory status.     Subjective Data  Patient Stated Goal: none stated; wife's goal is for pt to come home    Cognition  Cognition Arousal/Alertness: Lethargic;Suspect due  to medications Behavior During Therapy: Restless Overall Cognitive Status: Impaired/Different from baseline Area of Impairment:  Orientation;Attention;Safety/judgement;Following commands;Problem solving;Awareness Orientation Level: Disoriented to;Place;Situation;Time Current Attention Level: Focused Memory: Decreased short-term memory Following Commands: Follows one step commands with increased time Safety/Judgement: Decreased awareness of deficits;Decreased awareness of safety Awareness: Emergent Problem Solving: Slow processing;Decreased initiation;Difficulty sequencing;Requires verbal cues;Requires tactile cues General Comments: pt able to perform bed mobility with incr time and cues; opens his eyes and responds to consistently to stimuli and appropriately to questions ~ 50% of the time  Rancho Levels of Cognitive Functioning [place] [address] Scales of Cognitive Functioning: Confused/inappropriate/non-agitated (possibly level IV emerging V )    Balance  Balance Overall balance assessment: Needs assistance Sitting-balance support: Single extremity supported;Feet supported Sitting balance-Leahy Scale: Poor Sitting balance - Comments: pt with ant/posterior and lateral sway. pt demo'd attempt to consistently "right" self however unable to stabilize self in midline position. pt sat EOB x 20 min working with speech and OT completing functional tasks. Standing balance support: Bilateral upper extremity supported Standing balance-Leahy Scale: Poor Standing balance comment: pt with bilat knee buckling requiring constant v/c's to maintain bilat knees in extension.  End of Session PT - End of Session Equipment Utilized During Treatment: Oxygen Activity Tolerance: Patient tolerated treatment well Patient left: in bed;with bed alarm set;with call bell/phone within reach;with family/visitor present Nurse Communication: Mobility status;Precautions   GP     [name], Aleka Twitty Marie 12/29/2013, 12:38 PM  Lewis ShockAshly Isidoro Santillana, PT, DPT Pager #: [phone] Office #: [phone]

## 2013-12-29 NOTE — Progress Notes (Signed)
PULMONARY / CRITICAL CARE MEDICINE   Name: Maurice Paul MRN: [id number] DOB: Apr 23, 1946    ADMISSION DATE:  12/24/2013 CONSULTATION DATE:  12/25/2013  REFERRING MD :  [name] PRIMARY SERVICE: PCCM  CHIEF COMPLAINT:  SAH  BRIEF PATIENT DESCRIPTION: 68 year old male with admitted 3/18 with traumatic SAH and SDH. 3/19 mental and respiratory status decompensated and PCCM has been asked to assist with his management.   SIGNIFICANT EVENTS / STUDIES:  3/18 CT head- Small SAH Left posterior parietal, Persistent SDH along falx and tentorium. 3/19 CT head- No change in amount of intracranial blood, no hydrocephalus.  3/21 MRI >>subarachnoid blood over the vertex and within the interhemispheric fissure   LINES / TUBES: ETT 3/19 >> 3/20 [facility] 3/19 >>3/23  CULTURES: None  ANTIBIOTICS: None  SUBJECTIVE:  Intermittent agitation noted.  VITAL SIGNS: Temp:  [98.1 F (36.7 C)-99 F (37.2 C)] 98.1 F (36.7 C) (03/23 0729) Pulse Rate:  [49-94] 79 (03/23 0900) Resp:  [15-29] 24 (03/23 0900) BP: (116-176)/(55-104) 141/63 mmHg (03/23 0900) SpO2:  [93 %-99 %] 96 % (03/23 0900)  HEMODYNAMICS:    VENTILATOR SETTINGS:    INTAKE / OUTPUT: Intake/Output     03/22 0701 - 03/23 0700 03/23 0701 - 03/24 0700   I.V. (mL/kg) 1800 (13.3) 150 (1.1)   IV Piggyback 325    Total Intake(mL/kg) 2125 (15.7) 150 (1.1)   Urine (mL/kg/hr) 1000 (0.3) 350 (0.8)   Total Output 1000 350   Net +1125 -200        Urine Occurrence 2 x     PHYSICAL EXAMINATION: General:  Chronically ill appearing, arousable. Neuro:  Awake, follows some commands , able to hold conversation, oriented x 2 HEENT:  /AT. Cardiovascular:  RRR, Nl S1/S2, -M/R/G. Lungs:  Diminished bilateral. Abdomen:  Obese, soft non-distended Musculoskeletal:  No acute deformity Skin:  Intact.  LABS:  CBC  Recent Labs Lab 12/26/13 0320 12/27/13 0621 12/28/13 0555  WBC 11.6* 11.1* 10.2  HGB 15.0 15.7 15.4  HCT 45.9 47.8 47.4  PLT  157 164 182   Coag's  Recent Labs Lab 12/24/13 0520  APTT 28  INR 1.10   BMET  Recent Labs Lab 12/26/13 0320 12/27/13 0621 12/28/13 0555  NA 144 146 146  K 3.7 3.4* 3.6*  CL 100 99 102  CO2 33* 35* 34*  BUN 17 17 14   CREATININE 0.85 0.70 0.64  GLUCOSE 145* 121* 121*   Electrolytes  Recent Labs Lab 12/26/13 0320 12/27/13 0621 12/28/13 0555  CALCIUM 8.4 8.2* 8.2*  MG 2.2 2.3  --   PHOS 0.5* 2.4  --    Sepsis Markers No results found for this basename: LATICACIDVEN, PROCALCITON, O2SATVEN,  in the last 168 hours ABG  Recent Labs Lab 12/25/13 1123 12/26/13 0340 12/26/13 1610  PHART 7.372 7.445 7.386  PCO2ART 60.3* 49.1* 60.2*  PO2ART 106.0* 65.0* 70.0*   Liver Enzymes No results found for this basename: AST, ALT, ALKPHOS, BILITOT, ALBUMIN,  in the last 168 hours Cardiac Enzymes  Recent Labs Lab 12/25/13 0400  PROBNP 577.4*   Glucose  Recent Labs Lab 12/28/13 1136 12/28/13 1531 12/28/13 1926 12/29/13 0019 12/29/13 0357 12/29/13 0728  GLUCAP 131* 121* 118* 114* 111* 125*    Imaging Mr Brain Wo Contrast  12/27/2013   CLINICAL DATA:  Traumatic brain injury.  EXAM: MRI HEAD WITHOUT CONTRAST  TECHNIQUE: Multiplanar, multiecho pulse sequences of the brain and surrounding structures were obtained without intravenous contrast.  COMPARISON:  Head CT 12/25/2013  and previous  FINDINGS: The study suffers from motion degradation. Because of those, the diagnostic utility is compromised. Subarachnoid hemorrhage remains evident over the convexity and in the interhemispheric fissure. There does not appear to be any focal intraparenchymal hematoma or focal subdural hematoma. No shift. No hydrocephalus. I do not see an ischemic infarction.  IMPRESSION: Marked motion degradation. MR also shows evidence of subarachnoid blood over the vertex and within the interhemispheric fissure. No detectable intraparenchymal hemorrhage or subdural hematoma. No hydrocephalus or shift.    Electronically Signed   By: Paulina FusiMark  Shogry M.D.   On: 12/27/2013 18:02   CXR: Bibasilar infiltrates  ASSESSMENT / PLAN:  PULMONARY A: Acute Respiratory Failure due to [facility]cephal[facility]  Compensated hypercarbia P:   - O2 [place] and titrate for sat of 88-92%.  CARDIOVASCULAR A:  BP management in setting of SAH  P:  - Keep SBP < 140 mm/Hg. - PRN hydralazine as ordered. - Low dose oral Coreg added.  RENAL A:  Hypokalemia/hypophos - repleted P:   - Hold lasix - Monitor BMP. - KVO IVF. - Replace K.  GASTROINTESTINAL A:  No acute issues P:   - PPI for Gi ppx - Likely will pass swallow evaluation today and start diet, no need for NGT and TF.  HEMATOLOGIC A:  No acute issues P:  - Monitor CBC, coags.  INFECTIOUS A:   Leukocytosis - thick yellow secretions noted during intubation  P:   - Monitor off ABX. - Trend CBC and fever curve.  ENDOCRINE A:   Hyperglycemia  P:   - Tight glycemic control. - SSI.  NEUROLOGIC A:   SAH - Small SAH Left posterior parietal,   SDH -  along falx and tentorium -not seen on MRI Acute Encephalopathy , EEG neg for seizure  P:   - Neurology following. - Goal RASS 0. - Use Haldol prn, Precedex limited by BP-can use low dose - Cont keppra - Will add low dose Risperdal.   Updated wife.  TODAY'S SUMMARY: Add anti-psychotic medications, oral anti-HTN added, needs continuous EEG per neuro, continue keppra, [facility] the [facility]  PCCM will sign off, please call back if needed.  I have personally obtained a history, examined the patient, evaluated laboratory and imaging results, formulated the assessment and plan and placed orders.  Alyson ReedyWesam G. Glenville Espina, M.D. [facility]. Pager: [phone]. After hours pager: [phone].

## 2013-12-29 NOTE — Consult Note (Signed)
Physical Medicine and Rehabilitation Consult Reason for Consult: SAH/SDH Referring Physician: Critical care   HPI: Maurice Paul is a 68 y.o. right-handed male with history of tobacco abuse. Admitted 12/24/2013 after a fall down approximately 8-10 steps and found to by his wife. No report of loss of consciousness. CT of the head showed subarachnoid hemorrhage over the high left parietal convexities and a small amount of subdural hemorrhage along the falx and tentorium without hydrocephalus or midline shift. CT cervical spine negative. Neurosurgery consulted Dr. Barnett AbuHenry Elsner with conservative care. Followup MRI of the brain 12/27/2013 without change. Patient did require short intubation and was extubated 12/26/2013. Maintained on Keppra for seizure prophylaxis. Currently maintained on a dysphagia 1 thin liquid diet. Bouts of delirium and agitation. Physical therapy evaluation completed 12/28/2013 at the recommendations of physical medicine rehabilitation consult.   Review of Systems  Unable to perform ROS: mental acuity   Past Medical History  Diagnosis Date  . PVD (peripheral vascular disease)   . Tobacco chew use     for 30 years   Past Surgical History  Procedure Laterality Date  . Cyst removal trunk     History reviewed. No pertinent family history. Social History:  reports that he quit smoking about 15 years ago. His smokeless tobacco use includes Chew. He reports that he drinks alcohol. His drug history is not on file. Allergies: No Known Allergies No prescriptions prior to admission    Home: Home Living Family/patient expects to be discharged to:: Private residence Living Arrangements: Spouse/significant other Available Help at Discharge: Family;Available 24 hours/day Type of Home: House Home Access: Stairs to enter Entergy CorporationEntrance Stairs-Number of Steps: 4-6 Entrance Stairs-Rails: Can reach both;Left;Right Home Layout: Two level;Able to live on main level with  bedroom/bathroom Alternate Level Stairs-Number of Steps: flight Home Equipment: None Additional Comments: wife available for PLOF and home setup information   Lives With: Spouse  Functional History: Prior Function Level of Independence: Needs assistance Gait / Transfers Assistance Needed: independent ADL's / Homemaking Assistance Needed: minA to donn/doff socks due to body habitus Functional Status:  Mobility: Bed Mobility Overal bed mobility: Needs Assistance;+ 2 for safety/equipment Bed Mobility: Rolling Rolling: +2 for safety/equipment;Min assist Supine to sit: Max assist;+2 for physical assistance Sit to supine: Max assist;+2 for physical assistance General bed mobility comments: max verbal and tactile cues to complete task. pt with difficulty sequencing task. appears to have decresaed R sided strength Transfers Overall transfer level: Needs assistance Equipment used: 2 person hand held assist Transfers: Sit to/from Stand Sit to Stand: Max assist;+2 physical assistance General transfer comment: B knees blocked. motor impersistence Ambulation/Gait Ambulation/Gait assistance:  (not yet appropriate)    ADL:    Cognition: Cognition Overall Cognitive Status: Impaired/Different from baseline Arousal/Alertness: Lethargic Orientation Level: Oriented to person;Disoriented to place;Disoriented to time;Disoriented to situation Attention: Sustained Sustained Attention: Impaired Sustained Attention Impairment: Verbal basic Memory:  (TBA) Awareness: Impaired Awareness Impairment: Intellectual impairment;Emergent impairment;Anticipatory impairment Problem Solving: Impaired Problem Solving Impairment: Verbal basic Safety/Judgment: Impaired [place] [address] Scales of Cognitive Functioning: Confused/inappropriate/non-agitated Cognition Arousal/Alertness: Lethargic;Suspect due to medications Behavior During Therapy: Flat affect Overall Cognitive Status: Impaired/Different from  baseline Area of Impairment: Orientation;Attention;Safety/judgement;Following commands;Problem solving;Awareness;Memory;Rancho level Orientation Level: Disoriented to;Place;Situation;Time Current Attention Level: Focused Memory: Decreased recall of precautions;Decreased short-term memory Following Commands: Follows one step commands with increased time Safety/Judgement: Decreased awareness of deficits;Decreased awareness of safety Awareness: Intellectual Problem Solving: Slow processing;Decreased initiation;Difficulty sequencing;Requires verbal cues;Requires tactile cues General Comments: pt required increased  time to repsond and freq verbal cues to stay on task/maintain eyes open  Blood pressure 144/62, pulse 75, temperature 98.2 F (36.8 C), temperature source Oral, resp. rate 20, height 5\' 10"  (1.778 m), weight 135.6 kg (298 lb 15.1 oz), SpO2 94.00%. Physical Exam  Vitals reviewed. Eyes:  Pupils sluggish to light  Neck: Normal range of motion. Neck supple. No thyromegaly present.  Cardiovascular: Normal rate and regular rhythm.   Respiratory:  Decreased breath sounds at the bases with poor inspiratory effort  GI: Soft. Bowel sounds are normal. He exhibits no distension.  Neurological:  Patient is lethargic but arousable. He would state his name but would fall asleep during exam. He did not follow commands. Restraints were in place. Moves all 4's left more spontaneously than right. RLAS V.  Psychiatric:  confused    Results for orders placed during the hospital encounter of 12/24/13 (from the past 24 hour(s))  GLUCOSE, CAPILLARY     Status: Abnormal   Collection Time    12/28/13  3:31 PM      Result Value Ref Range   Glucose-Capillary 121 (*) 70 - 99 mg/dL  GLUCOSE, CAPILLARY     Status: Abnormal   Collection Time    12/28/13  7:26 PM      Result Value Ref Range   Glucose-Capillary 118 (*) 70 - 99 mg/dL   Comment 1 Notify RN    GLUCOSE, CAPILLARY     Status: Abnormal    Collection Time    12/29/13 12:19 AM      Result Value Ref Range   Glucose-Capillary 114 (*) 70 - 99 mg/dL   Comment 1 Documented in Chart     Comment 2 Notify RN    GLUCOSE, CAPILLARY     Status: Abnormal   Collection Time    12/29/13  3:57 AM      Result Value Ref Range   Glucose-Capillary 111 (*) 70 - 99 mg/dL   Comment 1 Documented in Chart     Comment 2 Notify RN    GLUCOSE, CAPILLARY     Status: Abnormal   Collection Time    12/29/13  7:28 AM      Result Value Ref Range   Glucose-Capillary 125 (*) 70 - 99 mg/dL   Mr Brain Wo Contrast  12/27/2013   CLINICAL DATA:  Traumatic brain injury.  EXAM: MRI HEAD WITHOUT CONTRAST  TECHNIQUE: Multiplanar, multiecho pulse sequences of the brain and surrounding structures were obtained without intravenous contrast.  COMPARISON:  Head CT 12/25/2013 and previous  FINDINGS: The study suffers from motion degradation. Because of those, the diagnostic utility is compromised. Subarachnoid hemorrhage remains evident over the convexity and in the interhemispheric fissure. There does not appear to be any focal intraparenchymal hematoma or focal subdural hematoma. No shift. No hydrocephalus. I do not see an ischemic infarction.  IMPRESSION: Marked motion degradation. MR also shows evidence of subarachnoid blood over the vertex and within the interhemispheric fissure. No detectable intraparenchymal hemorrhage or subdural hematoma. No hydrocephalus or shift.   Electronically Signed   By: Paulina Fusi M.D.   On: 12/27/2013 18:02    Assessment/Plan: Diagnosis: Left-sided SDH/SAH after fall down steps 1. Does the need for close, 24 hr/day medical supervision in concert with the patient's rehab needs make it unreasonable for this patient to be served in a less intensive setting? Yes 2. Co-Morbidities requiring supervision/potential complications: AVDRF, morbid obesity 3. Due to bladder management, bowel management, safety, skin/wound care, disease management,  medication administration, pain management and patient education, does the patient require 24 hr/day rehab nursing? Yes 4. Does the patient require coordinated care of a physician, rehab nurse, PT (1-2 hrs/day, 5 days/week), OT (1-2 hrs/day, 5 days/week) and SLP (1-2 hrs/day, 5 days/week) to address physical and functional deficits in the context of the above medical diagnosis(es)? Yes Addressing deficits in the following areas: balance, endurance, locomotion, strength, transferring, bowel/bladder control, bathing, dressing, feeding, grooming, toileting, cognition, speech, language, swallowing and psychosocial support 5. Can the patient actively participate in an intensive therapy program of at least 3 hrs of therapy per day at least 5 days per week? Yes 6. The potential for patient to make measurable gains while on inpatient rehab is excellent 7. Anticipated functional outcomes upon discharge from inpatient rehab are supervision  with PT, supervision and min assist with OT, supervision with SLP. 8. Estimated rehab length of stay to reach the above functional goals is: 18-22 days 9. Does the patient have adequate social supports to accommodate these discharge functional goals? Yes 10. Anticipated D/C setting: Home 11. Anticipated post D/C treatments: HH therapy and Outpatient therapy 12. Overall Rehab/Functional Prognosis: excellent  RECOMMENDATIONS: This patient's condition is appropriate for continued rehabilitative care in the following setting: CIR Patient has agreed to participate in recommended program. N/A Note that insurance prior authorization may be required for reimbursement for recommended care.  Comment: Rehab Admissions Coordinator to follow up.  Thanks,  Ranelle Oyster, MD, [place] [facility]     12/29/2013

## 2013-12-29 NOTE — Progress Notes (Signed)
eLink Physician-Brief Progress Note Patient Name: Maurice Paul DOB: 03/19/1946 MRN: [id number]  Date of Service  12/29/2013   HPI/Events of Note   Goal SBP < 140  eICU Interventions  Additional dose hydralazine 10mg  given   Intervention Category Intermediate Interventions: Hypertension - evaluation and management  Richel Millspaugh S. 12/29/2013, 1:24 AM

## 2013-12-29 NOTE — Progress Notes (Signed)
Subjective: Failed swallow due to episode of unresponsiveness.   Exam: Filed Vitals:   12/29/13 0800  BP: 176/65  Pulse: 81  Temp:   Resp: 20   Gen: In bed, NAD MS: Awakens to minor stimuli, follows commands in all four extremities. Responds slightly more quickly today than previous days.  ZO:XWRUECN:PERRL, does cross midline both directions Motor: withdraws all extermities to noxious stimuli and follows commands x 4.  Sensory: as above.   MRI limited by motion, but does not show any significant change.   Impression: 68 yo M with CHI and AMS. I suepct multifactorial delirium in teh setting of TBI and respiratory failure. With continued episodes of decreased responsiveness, will get continuous EEG.   Recommendations: 1) will increase keppra to 1gm BID. 2) will get 24 hour EEG to rule out intermittent seizure activity.   Ritta SlotMcNeill Cortana Vanderford, MD [facility] [phone]  If 7pm- 7am, please page neurology on call at [phone].

## 2013-12-29 NOTE — Progress Notes (Signed)
Occupational Therapy Evaluation Patient Details Name: Maurice Paul MRN: [id number] DOB: 10/13/1945 Today's Date: 12/29/2013 Time: [id number] OT Time Calculation (min): 57 min  OT Assessment / Plan / Recommendation History of present illness [facility] [facility]  pt intubated for 2 days.  possibly experiencing mulifactorial delirium in form of TBI due to respiratory status.    Clinical Impression   PTA, pt independent with mobility and min a with LB dressing. Pt presents behavioral characteristics consistent with Rancho level V and would benefit from CIR to facilitate return home with wife, who can provide 24/7 S. Pt will benefit from skilled OT services to facilitate D/C to CIR due to below deficits.    OT Assessment  Patient needs continued OT Services    Follow Up Recommendations  CIR;Supervision/Assistance - 24 hour    Barriers to Discharge      Equipment Recommendations  3 in 1 bedside comode    Recommendations for Other Services Rehab consult  Frequency  Min 3X/week    Precautions / Restrictions Precautions Precautions: Fall Precaution Comments: pt with mitt restraints and waist restraints  Restrictions Weight Bearing Restrictions: No   Pertinent Vitals/Pain Vitals stable    ADL  Eating/Feeding: NPO Grooming: Maximal assistance Where Assessed - Grooming: Supported sitting Upper Body Bathing: Maximal assistance Where Assessed - Upper Body Bathing: Supported sitting Lower Body Bathing: +1 Total assistance Where Assessed - Lower Body Bathing: Supported sit to stand Upper Body Dressing: +1 Total assistance Where Assessed - Upper Body Dressing: Supported sitting Lower Body Dressing: +1 Total assistance Where Assessed - Lower Body Dressing: Supported sit to Pharmacist, hospitalstand Toilet Transfer: +2 Total assistance;Maximal assistance Toileting - Clothing Manipulation and Hygiene: +1 Total assistance Equipment Used: Gait  belt Transfers/Ambulation Related to ADLs: +2 total A ADL Comments: able to complete hand to mouth pattern with mod vc to use R hand. Difficulty with accracy of placement of spoon. Washing face briefly with L hand    OT Diagnosis: Generalized weakness;Cognitive deficits;Disturbance of vision;Altered mental status  OT Problem List: Decreased strength;Decreased range of motion;Decreased activity tolerance;Impaired balance (sitting and/or standing);Impaired vision/perception;Decreased coordination;Decreased cognition;Decreased safety awareness;Decreased knowledge of use of DME or AE;Decreased knowledge of precautions;Cardiopulmonary status limiting activity;Obesity;Impaired UE functional use OT Treatment Interventions: Self-care/ADL training;Therapeutic exercise;Neuromuscular education;Energy conservation;DME and/or AE instruction;Therapeutic activities;Cognitive remediation/compensation;Visual/perceptual remediation/compensation;Patient/family education;Balance training   OT Goals(Current goals can be found in the care plan section) Acute Rehab OT Goals Patient Stated Goal: none stated; wife's goal is for pt to come home  OT Goal Formulation: With family Time For Goal Achievement: 01/12/14 Potential to Achieve Goals: Good  Visit Information  Last OT Received On: 12/29/13 Assistance Needed: +2 (possibly 3) PT/OT/SLP Co-Evaluation/Treatment: Yes Reason for Co-Treatment: Necessary to address cognition/behavior during functional activity PT goals addressed during session: Mobility/safety with mobility OT goals addressed during session: ADL's and self-care SLP goals addressed during session: Swallowing;Cognition;Communication History of Present Illness: [facility] [place],  pt intubated for 2 days.  possibly experiencing mulifactorial delirium in form of TBI due to respiratory status.        Prior Functioning     Home Living Family/patient expects  to be discharged to:: Private residence Living Arrangements: Spouse/significant other Available Help at Discharge: Family;Available 24 hours/day Type of Home: House Home Access: Stairs to enter Entergy CorporationEntrance Stairs-Number of Steps: 4-6 Entrance Stairs-Rails: Can reach both;Left;Right Home Layout: Two level;Able to live on main level with bedroom/bathroom Alternate  Level Stairs-Number of Steps: flight Home Equipment: None Additional Comments: wife available for PLOF and home setup information   Lives With: Spouse Prior Function Level of Independence: Needs assistance Gait / Transfers Assistance Needed: independent ADL's / Homemaking Assistance Needed: minA to donn/doff socks due to body habitus Communication Communication: Expressive difficulties (dysarthric) Dominant Hand: Right         Vision/Perception Vision - History Baseline Vision: Wears glasses only for reading Patient Visual Report: Other (comment) (pt unable to state) Vision - Assessment Eye Alignment: Impaired (comment) Vision Assessment: Vision impaired - to be further tested in functional context Additional Comments: R gaze preference Perception Perception: Not tested Praxis Praxis: Impaired Praxis Impairment Details: Perseveration;Initiation   Cognition  Cognition Arousal/Alertness: Lethargic;Suspect due to medications Behavior During Therapy: Flat affect Overall Cognitive Status: Impaired/Different from baseline Area of Impairment: Orientation;Attention;Safety/judgement;Following commands;Problem solving;Awareness;Memory;Rancho level Orientation Level: Disoriented to;Place;Situation;Time Current Attention Level: Focused Memory: Decreased recall of precautions;Decreased short-term memory Following Commands: Follows one step commands with increased time Safety/Judgement: Decreased awareness of deficits;Decreased awareness of safety Awareness: Intellectual Problem Solving: Slow processing;Decreased  initiation;Difficulty sequencing;Requires verbal cues;Requires tactile cues General Comments: pt required increased time to repsond and freq verbal cues to stay on task/maintain eyes open Rancho Levels of Cognitive Functioning [email or web address] Scales of Cognitive Functioning: Confused/inappropriate/non-agitated    Extremity/Trunk Assessment Upper Extremity Assessment Upper Extremity Assessment: RUE deficits/detail;LUE deficits/detail RUE Deficits / Details: generalized weakness RUE Coordination: decreased fine motor;decreased gross motor ("clumsy") LUE Deficits / Details: generalized weakness; using L non dominant automatically LUE Coordination: decreased fine motor;decreased gross motor Lower Extremity Assessment Lower Extremity Assessment: Defer to PT evaluation Cervical / Trunk Assessment Cervical / Trunk Assessment: Other exceptions Cervical / Trunk Exceptions: poor postural control     Mobility Bed Mobility Overal bed mobility: Needs Assistance;+ 2 for safety/equipment Bed Mobility: Rolling Rolling: +2 for safety/equipment;Min assist Supine to sit: Max assist;+2 for physical assistance Sit to supine: Max assist;+2 for physical assistance General bed mobility comments: max verbal and tactile cues to complete task. pt with difficulty sequencing task. appears to have decresaed R sided strength Transfers Overall transfer level: Needs assistance Equipment used: 2 person hand held assist Transfers: Sit to/from Stand Sit to Stand: Max assist;+2 physical assistance General transfer comment: B knees blocked. motor impersistence     Exercise     Balance Balance Overall balance assessment: Needs assistance Sitting-balance support: Feet supported;Bilateral upper extremity supported Sitting balance-Leahy Scale: Poor Sitting balance - Comments: pt with ant/posterior and lateral sway. pt demo'd attempt to consistently "right" self however unable to stabilize self in midline position. pt  sat EOB x 20 min working with speech and OT completing functional tasks. Postural control: Posterior lean;Right lateral lean;Left lateral lean Standing balance support: Bilateral upper extremity supported Standing balance-Leahy Scale: Zero Standing balance comment: max vc and tactile cues to maintina upright posutre General Comments General comments (skin integrity, edema, etc.): Performance may have been affected by meds   End of Session OT - End of Session Equipment Utilized During Treatment: Gait belt Activity Tolerance: Patient limited by lethargy Patient left: in bed;with call bell/phone within reach;with family/visitor present Nurse Communication: Mobility status  GO     Colson Barco,HILLARY 12/29/2013, 1:21 PM [facility], [place], OTR/L  [phone] 12/29/2013

## 2013-12-29 NOTE — Progress Notes (Signed)
Spoke with Dr. Delton CoombesByrum concerning patient's elevated BP. Orders received. Also spoke with regarding bladder scan volume of 534 and patient not urinating through condom catheter since arrived on shift. Instructed to wait a couple hours to see if patient could void on his own. Will monitor.

## 2013-12-29 NOTE — Progress Notes (Signed)
Speech Language Pathology Treatment: Dysphagia  Patient Details Name: Maurice Paul MRN: [id number] DOB: 09/04/1946 Today's Date: 12/29/2013 Time: [id number] SLP Time Calculation (min): 45 min  Assessment / Plan / Recommendation Clinical Impression  Pt. Seen with PT/OT for dysphagia/cognitive-communicative abilities.  Pt. Awake with periods of drowsiness requiring mod-max verba/visual/tactile assist.  He demonstrated Rancho V behaviors this session (confused;inappropriate) s/p receiving Haldol earlier this morning.  Self fed trials puree, thin with moderate tactile assist without overt s/s aspiration.  Lethargy currently appears to be greatest risk factor for aspiration.  Dys 1, thin liquids are recommended when adequately alert.  He exhibited carryover for spatial/situational orientation given moderate verbal cues with delay of 2-4 minutes.  Followed one step commands during grooming activities with mild-mod prompts and extra time.  Wife educated on basic TBI information (Rancho level, ways to assist etc) and answered questions.  Pt. Is progressing toward goals and ST will continue treatment.   HPI HPI: 68 y.o. male with no significant PMH who presents after a fall.  Per wife she heard a noise, she then found her husband lying in the stair. He fell probably from 8 to 10 steps of stair. He was breathing, but unconscious. She report that he has been congested for last few days.  CT Persistent changes of small subarachnoid hemorrhage in the left posterior parietal vertex,  Persistent subdural hematoma along the falx and tentorium, Falcine subdural hematoma has increased slightly in size. No prominent mass effect.  Intubated 2 days.    Pertinent Vitals WDL  SLP Plan  Continue with current plan of care    Recommendations Diet recommendations: Dysphagia 1 (puree);Thin liquid Liquids provided via: Cup;No straw Medication Administration: Crushed with puree Supervision: Patient able to self feed;Full  supervision/cueing for compensatory strategies;Trained caregiver to feed patient Compensations: Slow rate;Small sips/bites;Check for pocketing Postural Changes and/or Swallow Maneuvers: Seated upright 90 degrees;Upright 30-60 min after meal              Oral Care Recommendations: Oral care Q4 per protocol Follow up Recommendations: Inpatient Rehab Plan: Continue with current plan of care    GO     Royce MacadamiaLisa Willis Jaryd Drew M.Ed [employer]CCC-SLP Pager [phone]  12/29/2013

## 2013-12-29 NOTE — Progress Notes (Signed)
TBI TEAM EVALUATION                             Precautions:   None  fall  ICP pressures n/a  DNR n/a  KI n/a  Weightbearing n/a  Sternal n/a  Contact Precautions n/a  Falls yes  Other: SDH   Cause of injury: Larey SeatFell down flight of stairs. CT + SAH L post parietal. SDH along falx and tentorium.  Date of injury: 12/24/13   Medical complications: Respiratory decompensation (ARF) 12/25/13. Intubated 3/19- 3/20.Marland Kitchen. Hypokalemic. K replaced. Acute encephalopathy.   Was patient intubated? Yes   IF yes, location/ dates? 3/19-3/20  Did loss of conscious occur? yes  If yes, how long? Unknown   MRI: yes  CT: yes   Chest xray: yes GCS score (initial and follow up): n/ascore n/a date ICP pressure ranges n/a (Length of time patient has currently been sedated: n/a   Response to lifting of sedation: DATE3/18 (Ativan) Response increased agitation        DATE 3/23 Response fentanyl PRN        DATE- Response - Occupation: retired. Worked at [employer] Primary Language: english   Pupil Appearance (size, shape) : normal  Check if positive + Pupillary light reflex N/A oculocephalic reflex Response to Sensory Testing: (for example: pinprick, temperature, noxious, visual, auditory olfactory) NORMAL             Reflexes: Check if present:  (chart only if present below)  None  -  grasp -  snout -  bite -  Tongue thrust -  sucking -  rooting -  Flexor withdrawal -  Extensor thrust -  palmonmental -  babinski -  Asymmetrical tonic neck reflex -  glabellar -   Additional Skilled Neurobehavioral observations:  No abnormalities observed  +  Decerebrate -  Decorticate -  Posturing [employer]-  Urie Loughner, OTR/L  [phone] 12/29/2013

## 2013-12-29 NOTE — Progress Notes (Signed)
Rehab Admissions Coordinator Note:  Patient was screened by Clois DupesBoyette, Shawnie Nicole Godwin for appropriateness for an Inpatient Acute Rehab Consult Per PT and SLP  recommendation. At this time, we are recommending Inpatient Rehab consult. Please order.   Clois DupesBoyette, Karlye Ihrig Godwin 12/29/2013, 9:33 AM  I can be reached at [phone].

## 2013-12-30 DIAGNOSIS — S069X9A Unspecified intracranial injury with loss of consciousness of unspecified duration, initial encounter: Secondary | ICD-10-CM

## 2013-12-30 DIAGNOSIS — J9600 Acute respiratory failure, unspecified whether with hypoxia or hypercapnia: Secondary | ICD-10-CM

## 2013-12-30 DIAGNOSIS — W19XXXA Unspecified fall, initial encounter: Secondary | ICD-10-CM

## 2013-12-30 DIAGNOSIS — I10 Essential (primary) hypertension: Secondary | ICD-10-CM | POA: Diagnosis present

## 2013-12-30 DIAGNOSIS — S069XAA Unspecified intracranial injury with loss of consciousness status unknown, initial encounter: Secondary | ICD-10-CM

## 2013-12-30 DIAGNOSIS — G9340 Encephalopathy, unspecified: Secondary | ICD-10-CM

## 2013-12-30 LAB — GLUCOSE, CAPILLARY
GLUCOSE-CAPILLARY: 111 mg/dL — AB (ref 70–99)
GLUCOSE-CAPILLARY: 142 mg/dL — AB (ref 70–99)
Glucose-Capillary: 120 mg/dL — ABNORMAL HIGH (ref 70–99)
Glucose-Capillary: 139 mg/dL — ABNORMAL HIGH (ref 70–99)
Glucose-Capillary: 142 mg/dL — ABNORMAL HIGH (ref 70–99)

## 2013-12-30 LAB — BASIC METABOLIC PANEL
BUN: 13 mg/dL (ref 6–23)
CALCIUM: 8.4 mg/dL (ref 8.4–10.5)
CO2: 33 mEq/L — ABNORMAL HIGH (ref 19–32)
CREATININE: 0.66 mg/dL (ref 0.50–1.35)
Chloride: 103 mEq/L (ref 96–112)
Glucose, Bld: 113 mg/dL — ABNORMAL HIGH (ref 70–99)
Potassium: 3.7 mEq/L (ref 3.7–5.3)
SODIUM: 146 meq/L (ref 137–147)

## 2013-12-30 LAB — CBC
HCT: 46.2 % (ref 39.0–52.0)
Hemoglobin: 15.1 g/dL (ref 13.0–17.0)
MCH: 33.5 pg (ref 26.0–34.0)
MCHC: 32.7 g/dL (ref 30.0–36.0)
MCV: 102.4 fL — ABNORMAL HIGH (ref 78.0–100.0)
PLATELETS: 193 10*3/uL (ref 150–400)
RBC: 4.51 MIL/uL (ref 4.22–5.81)
RDW: 13.9 % (ref 11.5–15.5)
WBC: 10.9 10*3/uL — AB (ref 4.0–10.5)

## 2013-12-30 LAB — PHOSPHORUS: Phosphorus: 2.3 mg/dL (ref 2.3–4.6)

## 2013-12-30 LAB — MAGNESIUM: MAGNESIUM: 2.5 mg/dL (ref 1.5–2.5)

## 2013-12-30 MED ORDER — HYDRALAZINE HCL 20 MG/ML IJ SOLN
10.0000 mg | INTRAMUSCULAR | Status: DC | PRN
  Administered 2013-12-30: 22:00:00 via INTRAVENOUS
  Administered 2013-12-30 – 2014-01-05 (×5): 10 mg via INTRAVENOUS
  Filled 2013-12-30 (×6): qty 1

## 2013-12-30 MED ORDER — VALPROATE SODIUM 500 MG/5ML IV SOLN
500.0000 mg | Freq: Three times a day (TID) | INTRAVENOUS | Status: DC
  Administered 2013-12-30 – 2013-12-31 (×5): 500 mg via INTRAVENOUS
  Filled 2013-12-30 (×9): qty 5

## 2013-12-30 MED ORDER — SODIUM CHLORIDE 0.9 % IV SOLN
INTRAVENOUS | Status: DC
  Administered 2013-12-30 – 2014-01-02 (×2): via INTRAVENOUS
  Administered 2014-01-02: 500 mL via INTRAVENOUS
  Administered 2014-01-03 (×2): 50 mL/h via INTRAVENOUS

## 2013-12-30 MED ORDER — VALPROATE SODIUM 500 MG/5ML IV SOLN
2000.0000 mg | Freq: Once | INTRAVENOUS | Status: AC
  Administered 2013-12-30: 2000 mg via INTRAVENOUS
  Filled 2013-12-30 (×2): qty 20

## 2013-12-30 MED ORDER — AMANTADINE HCL 100 MG PO CAPS
100.0000 mg | ORAL_CAPSULE | Freq: Two times a day (BID) | ORAL | Status: DC
  Administered 2013-12-30 – 2014-01-01 (×4): 100 mg via ORAL
  Filled 2013-12-30 (×9): qty 1

## 2013-12-30 NOTE — Progress Notes (Signed)
Patient seen, examined and discussed with my nurse practitioner. Agree with her noted. Agree plan keep n.p.o. as he is not coherent enough to swallow even with encouragement. We'll get inpatient rehabilitation consult, appreciate physical therapy help. Continue step down for now. Monitor for nutrition intake.

## 2013-12-30 NOTE — Progress Notes (Signed)
Attempted to give pill form of amantadine per order. Pt held pill in mouth and wouldn't swallow even with guidance. Notified Dr.Kirkpatrick of this and he asked to start amantadine once patient is taking pills again.  Will continue to monitor.

## 2013-12-30 NOTE — Clinical Social Work Placement (Signed)
Clinical Social Work Department CLINICAL SOCIAL WORK PLACEMENT NOTE 12/30/2013  Patient:  Maurice Paul,Maurice Paul  Account Number:  [id number] Admit date:  12/24/2013  Clinical Social Worker:  Lavell LusterJOSEPH BRYANT Landyn Buckalew, LCSWA  Date/time:  12/30/2013 08:15 PM  Clinical Social Work is seeking post-discharge placement for this patient at the following level of care:   SKILLED NURSING   (*CSW will update this form in Epic as items are completed)   12/30/2013  Patient/family provided with [facility] of [facility] Work's list of facilities offering this level of care within the geographic area requested by the patient (or if unable, by the patient's family).  12/30/2013  Patient/family informed of their freedom to choose among providers that offer the needed level of care, that participate in Medicare, Medicaid or managed care program needed by the patient, have an available bed and are willing to accept the patient.  12/30/2013  Patient/family informed of [facility]' ownership interest in [facility], as well as of the fact that they are under no obligation to receive care at this facility.  PASARR submitted to EDS on 12/30/2013 PASARR number received from EDS on 12/30/2013  FL2 transmitted to all facilities in geographic area requested by pt/family on  12/30/2013 FL2 transmitted to all facilities within larger geographic area on   Patient informed that his/her managed care company has contracts with or will negotiate with  certain facilities, including the following:     Patient/family informed of bed offers received:   Patient chooses bed at  Physician recommends and patient chooses bed at    Patient to be transferred to  on   Patient to be transferred to facility by   The following physician request were entered in Epic:   Additional Comments:   Maurice Paul, LostantLCSWA, StoyLCASA, [phone]

## 2013-12-30 NOTE — Progress Notes (Signed)
Physical Therapy Treatment Patient Details Name: Maurice Paul MRN: [id number] DOB: 10/06/1946 Today's Date: 12/30/2013    History of Present Illness [facility] [place]  pt intubated for 2 days.  possibly experiencing mulifactorial delirium in form of TBI due to respiratory status.     PT Comments    Pt extremely lethargic this date and difficult to arouse. 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 Will re-attempt to see pt in AM to re-assess mobility progression.   Follow Up Recommendations  CIR     Equipment Recommendations       Recommendations for Other Services       Precautions / Restrictions Precautions Precautions: Fall Precaution Comments: pt with wrist restraints Restrictions Weight Bearing Restrictions: No    Mobility  Bed Mobility Overal bed mobility: Needs Assistance;+ 2 for safety/equipment Bed Mobility: Rolling Rolling: +2 for safety/equipment;Min assist   Supine to sit: Max assist;+2 for physical assistance Sit to supine: Max assist;+2 for physical assistance   General bed mobility comments: pt dependent with no voluntary assist  Transfers Overall transfer level: Needs assistance Equipment used:  (2 person lift with bed pad) Transfers: Sit to/from Stand Sit to Stand: Max assist;+2 physical assistance         General transfer comment: bilat knees blocked unable to achieve full upright standing due to lethargy  Ambulation/Gait                 Stairs            Wheelchair Mobility    Modified Rankin (Stroke Patients Only)       Balance Overall balance assessment: Needs assistance Sitting-balance support: Feet supported Sitting balance-Leahy Scale: Zero Sitting balance - Comments: pt to lethargic                            Cognition Arousal/Alertness: Lethargic                      General Comments: pt unable to be aroused, spouse reports this to be his normal sleep cycle and that morning is better. Pt did not arouse to upright position in sitting or standing    Exercises      General Comments        Pertinent Vitals/Pain vss    Home Living                      Prior Function            PT Goals (current goals can now be found in the care plan section) Acute Rehab PT Goals Patient Stated Goal: none stated Progress towards PT goals: Not progressing toward goals - comment (pt to lethargic this date)    Frequency  Min 4X/week    PT Plan Current plan remains appropriate    End of Session Equipment Utilized During Treatment: Oxygen Activity Tolerance: Patient limited by lethargy Patient left: in bed;with call bell/phone within reach;with family/visitor present     Time: [id number] PT Time Calculation (min): 16 min  Charges:  $Therapeutic Activity: 8-22 mins                    G Codes:      Maurice Paul, Maurice Paul 12/30/2013, 5:19 PM   Maurice Paul, PT, DPT Pager #:  [phone] Office #: [phone]

## 2013-12-30 NOTE — Progress Notes (Signed)
Maurice Paul 1 - Stepdown / ICU Progress Note  Maurice Paul ZOX:[id number] DOB: [birth year] DOA: 12/24/2013 PCP: No primary provider on file.  Brief narrative: 68 year male patient who presented to the emergency department after falling. History was obtained from the patient's wife. According to the wife she heard a noise and found her husband lying at the bottom of the stairwell. His estimated he fell he stated 10 steps. He was breathing but unconscious. She reported that the patient had been expressing upper respiratory symptoms for a few days.  Upon evaluation in the ER imaging revealed a small traumatic subarachnoid hemorrhage as well as a subdural hematoma. On exam he was confused but his speech was clear. He had been somewhat agitated and had been given 2 doses of Ativan.   After admission the patient's mental status quickly deteriorated and on the morning of 12/25/2013 patient was unresponsive. ABG revealed hypercarbic respiratory failure with a PCO2 in the 90s. He was transferred to the ICU and intubated emergently. Since that time patient has had persistent metabolic encephalopathy. There was some concern of her seizures being the cause of this and initial EEG was negative. He's been empirically started on Keppra.  SIGNIFICANT EVENTS / STUDIES:  3/18 CT head- Small SAH Left posterior parietal, Persistent SDH along falx and tentorium.  3/19 CT head- No change in amount of intracranial blood, no hydrocephalus.  3/21 MRI >>subarachnoid blood over the vertex and within the interhemispheric fissure  Assessment/Plan: Active Problems:   SAH (subarachnoid hemorrhage) -Initially evaluated by neurosurgery at time of admission with no indications for surgical intervention -Neurology also following-suspect altered mentation related to traumatic brain injury and recent hypercarbic respiratory failure (see below) -Once delirium improves we'll need to begin PT and OT and evaluate rehabilitative  status    Acute respiratory failure with hypoxia and hypercarbia -So far stable and much more alert than on 12/25/2013 -Continue nasal cannula oxygen -Be careful not to give too much oxygen because we suspect this patient has underlying sleep apnea    Metabolic encephalopathy -Neurology suspects multifactorial delirium in setting of traumatic brain injury and hypercarbic respiratory failure -Initial EEG without seizure activity and continuous EEG also more consistent with metabolic encephalopathy -Keppra dose recently increased on 12/29/2013 assessment seizures can likely be discontinued -Patient did have swallowing evaluation 12/29/2013 that nurse notes has to continually to patient to swallow this morning so at this time patient is n.p.o. we'll begin IV fluid -If mentation does not improve within the next 24-48 hours I discussed with the wife and we may need to pursue short-term nasogastric feeds -Continue empiric amantadine and Risperdal    Morbid obesity -As above sleep apnea undiagnosed is suspected    HTN (hypertension) -Not all medications prior to admission -Continue carvedilol   DVT prophylaxis: SCDs Code Status: Full Family Communication: With wife at bedside today Disposition Plan/Expected LOS: Step down   Consultants: Neurosurgery-signed off Neurology PCCM  Procedures: EEG 3/20 negative Continuous EEG 3/24 negative  Antibiotics: None  HPI/Subjective:  Patient more alert than on 12/25/2013 but still confused and symptoms consistent with acute delirium. Requiring restraints. When awake does not verbalize any specific complaints  Objective: Blood pressure 146/56, pulse 80, temperature 98.5 F (36.9 C), temperature source Oral, resp. rate 20, height 5\' 10"  (1.778 m), weight 298 lb 15.1 oz (135.6 kg), SpO2 92.00%.  Intake/Output Summary (Last 24 hours) at 12/30/13 1337 Last data filed at 12/30/13 1100  Gross per 24 hour  Intake  270 ml  Output   1050 ml    Net   -780 ml     Exam: General: No acute respiratory distress Lungs: Coarse to auscultation bilaterally without wheezes or crackles, 3 L Cardiovascular: Regular rate and rhythm without murmur gallop or rub normal S1 and S2, no peripheral edema or JVD Abdomen: Nontender, nondistended, soft, bowel sounds positive, no rebound, no ascites, no appreciable mass Musculoskeletal: No significant cyanosis, clubbing of bilateral lower extremities Neurological: Awakens but is clearly confused although not significantly agitated. Does make appropriate eye contact and attempts to verbally communicate at times.  Scheduled Meds:  Scheduled Meds: . amantadine  100 mg Oral BID  . antiseptic oral rinse  15 mL Mouth Rinse q12n4p  . antiseptic oral rinse  15 mL Mouth Rinse BID  . carvedilol  3.125 mg Oral BID WC  . chlorhexidine  15 mL Mouth Rinse BID  . pantoprazole (PROTONIX) IV  40 mg Intravenous Q12H  . risperiDONE  0.25 mg Oral Daily  . valproate sodium  2,000 mg Intravenous Once  . valproate sodium  500 mg Intravenous 3 times per day   Continuous Infusions: . sodium chloride 50 mL/hr at 12/30/13 1227    Data Reviewed: Basic Metabolic Panel:  Recent Labs Lab 12/25/13 0400 12/26/13 0320 12/27/13 0621 12/28/13 0555 12/30/13 0315  NA 140 144 146 146 146  K 4.6 3.7 3.4* 3.6* 3.7  CL 98 100 99 102 103  CO2 31 33* 35* 34* 33*  GLUCOSE 170* 145* 121* 121* 113*  BUN 6 17 17 14 13   CREATININE 0.71 0.85 0.70 0.64 0.66  CALCIUM 8.5 8.4 8.2* 8.2* 8.4  MG  --  2.2 2.3  --  2.5  PHOS  --  0.5* 2.4  --  2.3   Liver Function Tests: No results found for this basename: AST, ALT, ALKPHOS, BILITOT, PROT, ALBUMIN,  in the last 168 hours No results found for this basename: LIPASE, AMYLASE,  in the last 168 hours  Recent Labs Lab 12/24/13 0414  AMMONIA 41   CBC:  Recent Labs Lab 12/24/13 0420 12/25/13 0400 12/26/13 0320 12/27/13 0621 12/28/13 0555 12/30/13 0315  WBC 9.0 12.9* 11.6*  11.1* 10.2 10.9*  NEUTROABS 6.1  --   --   --   --   --   HGB 16.5 16.3 15.0 15.7 15.4 15.1  HCT 48.3 49.7 45.9 47.8 47.4 46.2  MCV 99.2 102.7* 102.5* 103.2* 103.7* 102.4*  PLT 159 179 157 164 182 193   Cardiac Enzymes: No results found for this basename: CKTOTAL, CKMB, CKMBINDEX, TROPONINI,  in the last 168 hours BNP (last 3 results)  Recent Labs  12/25/13 0400  PROBNP 577.4*   CBG:  Recent Labs Lab 12/29/13 1936 12/29/13 2353 12/30/13 0316 12/30/13 0740 12/30/13 1205  GLUCAP 125* 142* 111* 120* 139*    Recent Results (from the past 240 hour(s))  MRSA PCR SCREENING     Status: None   Collection Time    12/24/13  2:06 PM      Result Value Ref Range Status   MRSA by PCR NEGATIVE  NEGATIVE Final   Comment:            The GeneXpert MRSA Assay (FDA     approved for NASAL specimens     only), is one component of a     comprehensive MRSA colonization     surveillance program. It is not     intended to diagnose MRSA  infection nor to guide or     monitor treatment for     MRSA infections.     Studies:  Recent x-ray studies have been reviewed in detail by the Attending Physician  Time spent :     Junious Silk, ANP [facility] Office  [phone] Pager [phone]  **If unable to reach the above provider after paging please contact the Flow Manager @ [phone]  On-Call/Text Page:      [email or web address]      password [email or web address]  If 7PM-7AM, please contact night-coverage [email or web address] Password [email or web address] 12/30/2013, 1:37 PM   LOS: 6 days

## 2013-12-30 NOTE — Procedures (Signed)
Long Term Video EEG Monitoring  HPI: This is a 68 year old male who presents with confusion after a fall.CT revealed a mild right subdural hematoma.  Medications: Keppra, Lorazepam, Risperidone,Haldol and Fentanyl  Technique: This was a continuous 19 hour video EEG monitoring with 16 channels of EEG and 1 channel of EKG which was performed during a state of stupor. Neither photic stimulation nor hyperventilation were performed as activating procedures.  Description: As the tracing opens the background consists of some generalized 6 to 7 Hertz delta theta activity with a voltage range of 7 to 49 microvolts. This activity was symmetric bilaterally. The recording appeared to be reactive to auditory and somatosensory stimulation. As the recording continued there were no asymmetries nor epileptiform discharges noted. There were no electrographic seizures noted.  Impression: This was an abnormal 19 hour video EEG monitoring as demonstrated by the presence of some moderate diffuse slowing. There were no push button events recorded.  Clinical Correlation: The above findings are consistent with a moderate diffuse or multifocal encephalopathy as can be seen secondary to medication effect, infection or other diffuse metabolic abnormalities. There were no electrographic seizures noted.   Was unable to reach Dr. Amada JupiterKirkpatrick  at the time of the reading   Di KindleChristopher S. Justin Mendonnelly, M.D.

## 2013-12-30 NOTE — Progress Notes (Signed)
Subjective: No seizures on EEG  Exam: Filed Vitals:   12/30/13 1100  BP:   Pulse:   Temp: 98.5 F (36.9 C)  Resp:    Gen: In bed, NAD MS: Awakens to minor stimuli, follows commands in all four extremities. Responds slightly more quickly today than previous days.  ZO:XWRUECN:PERRL, does cross midline both directions Motor: withdraws all extermities to noxious stimuli and follows commands x 4.  Sensory: as above.   MRI limited by motion, but does not show any significant change.   Impression: 68 yo M with CHI and AMS. I suepct multifactorial delirium in teh setting of TBI and respiratory failure. There has been some concern for episode of unresponsiveness, but it is not clear that there has been any siezure activity. 24 hour EEG shows no subclinical seizures.   Sometimes depakote can be useful in delirium and it may be reasonable to try this instead of keppra. Would stop 7 days from injury given no clear evidence of seizure.   Recommendations: 1) change keppra to depakote 2) will likely need prolonged rehab 3) will start amantadine as there is some evidence it can be helpful in TBI  Ritta SlotMcNeill Nathaneil Feagans, MD [facility] [phone]  If 7pm- 7am, please page neurology on call at [phone].

## 2013-12-30 NOTE — Progress Notes (Signed)
Called and spoke with Dr. Darrick Pennaeterding of [facility] regarding pt's elevated SBP (160s-170s) despite administration of PRN hydralazine. Order frequency for hydralazine increased and NOW dose verbally ordered. Will continue to monitor and assess.

## 2013-12-30 NOTE — Clinical Social Work Psychosocial (Signed)
Clinical Social Work Department BRIEF PSYCHOSOCIAL ASSESSMENT 12/30/2013  Patient:  Maurice Paul, Maurice Paul     Account Number:  [id number]     Admit date:  12/24/2013  Clinical Social Worker:  Lovey Newcomer  Date/Time:  12/30/2013 03:52 PM  Referred by:  Physician  Date Referred:  12/30/2013 Referred for  SNF Placement   Other Referral:   Interview type:  Family Other interview type:   Patient not alert and oriented at time of assessment.    PSYCHOSOCIAL DATA Living Status:  WIFE Admitted from facility:   Level of care:   Primary support name:  Maurice Paul Primary support relationship to patient:  SPOUSE Degree of support available:   Support is adequate.    CURRENT CONCERNS Current Concerns  Post-Acute Placement   Other Concerns:    SOCIAL WORK ASSESSMENT / PLAN CSW met with patient and wife at bedside to complete assessment as patient is being recommended for CIR. CSW explained to wife that patient will need a SNF backup plan since admission to CIR is not a guarantee. Patient's wife states that she agrees that she needs to have a backup plan because CIR is not a guarantee at this time. Maurice Paul states that she has heard of [facility] and [facility], so these may be her preferences. Maurice Paul states that patient and her were living together prior to this hospitalization and the plan is for the patient to return home once well enough. CSW explained the SNF search/placement process and answered wife's questions. Patient's wife seems tired as she states that she has spent a lot of time at the hospital.   Assessment/plan status:  Psychosocial Support/Ongoing Assessment of Needs Other assessment/ plan:   Complete FL2, Fax, PASRR   Information/referral to community resources:   CSW contact information and SNF list given to wife.    PATIENT'S/FAMILY'S RESPONSE TO PLAN OF CARE: Patient's wife is agreeable to SNF backup plan to CIR. Patient's wife Maurice Paul was pleasant, appropriate,  and appreciative of CSW contact. CSW will follow for DC needs.       Maurice Paul, [place], [place], [phone]

## 2013-12-30 NOTE — Progress Notes (Signed)
PT Cancellation Note  Patient Details Name: Maurice Paul MRN: [id number] DOB: 11/19/1945   Cancelled Treatment:    Reason Eval/Treat Not Completed: Patient at procedure or test/unavailable. Pt undergoing con't EEG. Will return as able/appropriate.   [name], Jeevan Kalla Marie 12/30/2013, 8:10 AM

## 2013-12-30 NOTE — Progress Notes (Signed)
Inpatient Rehabilitation  I met with the patient and his wife, Levander Campion, at the bedside to begin discussion of post acute rehab options.  Wife would like for pt. to come to IP rehab when medically ready.  Will follow along for his progress and plan to admit when he is medically ready and pending bed availability.  Please call me if questions.    [place] Admissions Coordinator Cell [phone] Office [phone]

## 2013-12-31 LAB — GLUCOSE, CAPILLARY
GLUCOSE-CAPILLARY: 114 mg/dL — AB (ref 70–99)
GLUCOSE-CAPILLARY: 125 mg/dL — AB (ref 70–99)
GLUCOSE-CAPILLARY: 131 mg/dL — AB (ref 70–99)
GLUCOSE-CAPILLARY: 92 mg/dL (ref 70–99)
Glucose-Capillary: 133 mg/dL — ABNORMAL HIGH (ref 70–99)
Glucose-Capillary: 138 mg/dL — ABNORMAL HIGH (ref 70–99)
Glucose-Capillary: 121 mg/dL — ABNORMAL HIGH (ref 70–99)
Glucose-Capillary: 148 mg/dL — ABNORMAL HIGH (ref 70–99)

## 2013-12-31 LAB — CBC
HEMATOCRIT: 46.7 % (ref 39.0–52.0)
Hemoglobin: 15.4 g/dL (ref 13.0–17.0)
MCH: 34.1 pg — ABNORMAL HIGH (ref 26.0–34.0)
MCHC: 33 g/dL (ref 30.0–36.0)
MCV: 103.3 fL — AB (ref 78.0–100.0)
PLATELETS: 202 10*3/uL (ref 150–400)
RBC: 4.52 MIL/uL (ref 4.22–5.81)
RDW: 14 % (ref 11.5–15.5)
WBC: 10.4 10*3/uL (ref 4.0–10.5)

## 2013-12-31 LAB — COMPREHENSIVE METABOLIC PANEL
ALBUMIN: 3 g/dL — AB (ref 3.5–5.2)
ALK PHOS: 62 U/L (ref 39–117)
ALT: 21 U/L (ref 0–53)
AST: 31 U/L (ref 0–37)
BUN: 15 mg/dL (ref 6–23)
CO2: 32 mEq/L (ref 19–32)
Calcium: 8.5 mg/dL (ref 8.4–10.5)
Chloride: 102 mEq/L (ref 96–112)
Creatinine, Ser: 0.73 mg/dL (ref 0.50–1.35)
GFR calc Af Amer: >90 mL/min (ref >90)
GFR calc non Af Amer: >90 mL/min (ref >90)
Glucose, Bld: 130 mg/dL — ABNORMAL HIGH (ref 70–99)
Potassium: 3.8 mEq/L (ref 3.7–5.3)
SODIUM: 147 meq/L (ref 137–147)
TOTAL PROTEIN: 7.1 g/dL (ref 6.0–8.3)
Total Bilirubin: 1 mg/dL (ref 0.3–1.2)

## 2013-12-31 MED ORDER — VALPROATE SODIUM 500 MG/5ML IV SOLN
500.0000 mg | Freq: Two times a day (BID) | INTRAVENOUS | Status: AC
  Administered 2014-01-01 – 2014-01-03 (×4): 500 mg via INTRAVENOUS
  Filled 2013-12-31 (×6): qty 5

## 2013-12-31 NOTE — Progress Notes (Addendum)
Physical Therapy Treatment Patient Details Name: Maurice Paul MRN: [id number] DOB: 05/26/1946 Today's Date: 12/31/2013    History of Present Illness Pt sustained fall at home [facility] [place],  pt intubated for 2 days.  possibly experiencing mulifactorial delirium in form of TBI due to respiratory status.     PT Comments    Pt with improved alertness and participation this date but remains to have impaired cognition. Pt with impaired processing, remains disoriented, impaired memory, and inconsistent command follow. Pt did tolerate transfer to the chair this date however required maximal assist x 2. Pt con't to demo characteristics of Rancho Level V, confused inappropriate. Con't to recommend CIR upon d/c.  Follow Up Recommendations  CIR     Equipment Recommendations       Recommendations for Other Services       Precautions / Restrictions Precautions Precautions: Fall Precaution Comments: swallowing Restrictions Weight Bearing Restrictions: No    Mobility  Bed Mobility Overal bed mobility: Needs Assistance Bed Mobility: Supine to Sit;Sit to Supine Rolling: Max assist   Supine to sit: Max assist     General bed mobility comments: assist to facilitate uprightrunk.   Transfers Overall transfer level: Needs assistance Equipment used:  (2 person dependent lift with gait belt and bed pad) Transfers: Sit to/from Stand Sit to Stand: Max assist;+2 physical assistance         General transfer comment: bilat knees blocked, unable to achieve full upright position this date. after max directional v/c's pt able to initiate anterior weight shift to prep for standing  Ambulation/Gait                 Stairs            Wheelchair Mobility    Modified Rankin (Stroke Patients Only)       Balance Overall balance assessment: Needs assistance Sitting-balance support: Feet supported;Bilateral upper extremity supported Sitting balance-Leahy  Scale: Zero Sitting balance - Comments: pt unable to maintain midline position. Pt with significant posterior and R lateral lean   Standing balance support:  (unable to achieve full upright standing, attempted x 2)                        Cognition Arousal/Alertness: Awake/alert Behavior During Therapy: Impulsive Overall Cognitive Status: Impaired/Different from baseline Area of Impairment: Orientation;Attention;Memory;Following commands;Safety/judgement;Awareness;Problem solving;Rancho level Orientation Level: Disoriented to;Place;Time;Situation Current Attention Level: Sustained (brief periods) Memory: Decreased recall of precautions;Decreased short-term memory Following Commands: Follows one step commands with increased time Safety/Judgement: Decreased awareness of safety;Decreased awareness of deficits Awareness: Intellectual Problem Solving: Slow processing;Decreased initiation;Difficulty sequencing;Requires verbal cues;Requires tactile cues General Comments: confabulating    Exercises      General Comments        Pertinent Vitals/Pain Denies pain    Home Living                      Prior Function            PT Goals (current goals can now be found in the care plan section) Acute Rehab PT Goals Patient Stated Goal: none stated Progress towards PT goals: Progressing toward goals    Frequency  Min 4X/week    PT Plan Current plan remains appropriate    End of Session Equipment Utilized During Treatment: Oxygen Activity Tolerance: Patient tolerated treatment well Patient left: in chair;with call bell/phone within reach (with lift pad beneath, RN present for transfer)  Time: 1610-9604 PT Time Calculation (min): 39 min  Charges:  $Therapeutic Activity: 23-37 mins $Neuromuscular Re-education: 8-22 mins                    G Codes:      Marcene Brawn 12/31/2013, 12:12 PM  Agree with above assessment.  Lewis Shock, PT,  DPT Pager #: [phone] Office #: [phone]

## 2013-12-31 NOTE — Progress Notes (Addendum)
1810 RN is called in the room by nurse tech to inform her pt gown in wet and IV possibly leaking. Pt IJ noted to be pulled out by pt, site dry and not bleeding. Pressure applied to the site and Gauze dsg applied to site. IV team paged and notified. Pt was in bed with sitter at bedside upon removal of the IJ access. Pt remains alert and verbally responsive but confused. No s/s of bleeding, hematoma or infection noted at the site. Will continue to monitor pt quietly and sitter continue to remain at side. Arabella MerlesP. Amo Demmi Sindt RN.  1930-On-call NP Craige CottaKirby paged concerning loss of IV access and pt BP. PRN not given d/t loss of IV access. Incoming RN reported off to and aware. Removed IJ remains sutured and intact in pt left neck for IV team to access/removed. Sitter remains at pt bedside. Arabella MerlesP. Amo Jamaiya Tunnell RN.

## 2013-12-31 NOTE — Progress Notes (Signed)
Occupational Therapy Treatment Patient Details Name: Murlean IbaWalter Shahan MRN: [id number] DOB: 06/18/1946 Today's Date: 12/31/2013    History of present illness [facility] [place] [facility]  pt intubated for 2 days.  possibly experiencing mulifactorial delirium in form of TBI due to respiratory status.    OT comments  Pt making progress. Improved sitting balance EOB and able to sustain attention to functional tasks. Confabulating. Demonstrating behaviors consistent with emerging VI [place]o level. Excellent CIR candidate. Will continue to folow to facilitate D/C to CIR.  Follow Up Recommendations  CIR;Supervision/Assistance - 24 hour    Equipment Recommendations  3 in 1 bedside comode    Recommendations for Other Services Rehab consult    Precautions / Restrictions Precautions Precautions: Fall Precaution Comments: swallowing       Mobility Bed Mobility Overal bed mobility: Needs Assistance Bed Mobility: Supine to Sit;Sit to Supine Rolling: Max assist   Supine to sit: Max assist     General bed mobility comments: assist to facilitate uprightrunk.   Transfers                      Balance Overall balance assessment: Needs assistance Sitting-balance support: Feet supported;Single extremity supported Sitting balance-Leahy Scale: Poor Sitting balance - Comments: Able to maintain balance EOB without assistance for brief periods. Pt rights himself to midline. significant sway noted. appears more related to attention deficits                           ADL Eating/Feeding: Moderate assistance;Cueing for sequencing;Cueing for safety;Sitting (cuing for safety. unaware of precaqutions) Grooming: Moderate assistance;Sitting;Cueing for safety;Cueing for sequencing                 General ADL Comments: Increased participation today with ADL with increaed attention span. Enjouyed drinking Coke      MicrobiologistVision                      Perception     Praxis      Cognition   Behavior During Therapy: Impulsive Overall Cognitive Status: Impaired/Different from baseline Area of Impairment: Orientation;Attention;Memory;Following commands;Safety/judgement;Awareness;Problem solving;Rancho level Orientation Level: Disoriented to;Place;Time;Situation Current Attention Level: Sustained (brief periods) Memory: Decreased recall of precautions;Decreased short-term memory  Following Commands: Follows one step commands with increased time Safety/Judgement: Decreased awareness of safety;Decreased awareness of deficits Awareness: Intellectual Problem Solving: Slow processing;Decreased initiation;Difficulty sequencing;Requires verbal cues;Requires tactile cues General Comments: confabulating    Extremity/Trunk Assessment               Exercises       General Comments      Pertinent Vitals/ Pain       O2 sats 88 RA. 93 2 L  Home Living                                          Prior Functioning/Environment              Frequency Min 3X/week     Progress Toward Goals  OT Goals(current goals can now be found in the care plan section)  Progress towards OT goals: Progressing toward goals  Acute Rehab OT Goals Patient Stated Goal: none stated OT Goal Formulation: With family Time For Goal Achievement: 01/12/14 Potential to Achieve Goals: Good  Plan Discharge  plan remains appropriate    End of Session    Activity Tolerance Patient tolerated treatment well   Patient Left in bed;with call bell/phone within reach;with bed alarm set   Nurse Communication Mobility status        Time: 1610-9604 OT Time Calculation (min): 38 min  Charges: OT General Charges $OT Visit: 1 Procedure OT Treatments $Self Care/Home Management : 38-52 mins  Nixxon Faria,HILLARY 12/31/2013, 12:14 PM   [facility], OTR/L  [phone] 12/31/2013

## 2013-12-31 NOTE — Progress Notes (Addendum)
Inpatient Rehabilitation  I visited pt. briefly at the bedside.  He is much more alert today and was able to improve his participation in therapies.  I spoke with wife over the phone.  I will start process of requesting insurance authorization .  I will follow up as soon as I hear back from insurance.  Wife is aware that admission to CIR is dependent on medical clearance, insurance approval and bed availability.   In my absence on Thursday, Friday and Monday, please call Ottie GlazierBarbara Boyette [phone]) for questions.  Thank you!

## 2013-12-31 NOTE — Progress Notes (Signed)
[name]  Murlean IbaWalter Degracia HYQ:[id number] DOB: 10/19/1945 DOA: 12/24/2013 PCP: No primary provider on file.  Brief narrative: [age] year male patient who presented to the emergency department after falling. History was obtained from the patient's wife. According to the wife she heard a noise and found her husband lying at the bottom of the stairwell. His estimated he fell he stated 10 steps. He was breathing but unconscious. She reported that the patient had been expressing upper respiratory symptoms for a few days.  Upon evaluation in the ER imaging revealed a small traumatic subarachnoid hemorrhage as well as a subdural hematoma. On exam he was confused but his speech was clear. He had been somewhat agitated and had been given 2 doses of Ativan.   After admission the patient's mental status quickly deteriorated and on the morning of 12/25/2013 patient was unresponsive. ABG revealed hypercarbic respiratory failure with a PCO2 in the 90s. He was transferred to the ICU and intubated emergently. Since that time patient has had persistent metabolic encephalopathy. There was some concern of her seizures being the cause of this and initial EEG was negative. He's been empirically started on Keppra.  SIGNIFICANT EVENTS / STUDIES:  3/18 CT head- Small SAH Left posterior parietal, Persistent SDH along falx and tentorium.  3/19 CT head- No change in amount of intracranial blood, no hydrocephalus.  3/21 MRI >>subarachnoid blood over the vertex and within the interhemispheric fissure  Assessment/Plan: Active Problems:   SAH (subarachnoid hemorrhage) -Initially evaluated by neurosurgery at time of admission with no indications for surgical intervention -Neurology also following-suspect altered mentation related to traumatic brain injury and recent hypercarbic respiratory failure (see below) -PT/OT rec CIR- bed tentatively available for 3/26    Acute respiratory failure  with hypoxia and hypercarbia - due to encephalopathy/ atelectasis?  -So far stable and much more alert than on 12/25/2013 -Continue nasal cannula oxygen-check CXR in am- add IS as he is now alert -Be careful not to give too much oxygen because we suspect this patient has underlying sleep apnea    Metabolic encephalopathy -Neurology suspects multifactorial delirium in setting of traumatic brain injury and hypercarbic respiratory failure -Initial EEG without seizure activity and continuous EEG also more consistent with metabolic encephalopathy -much improved 3/25-begin diet -Continue amantadine and Risperdal per Neuro    Morbid obesity -As above sleep apnea undiagnosed is suspected    HTN (hypertension) -Not on medications prior to admission -Continue carvedilol   DVT prophylaxis: SCDs Code Status: Full Family Communication: With wife at bedside 3/24 Disposition Plan/Expected LOS: Transfer to floor   Consultants: Neurosurgery-signed off Neurology PCCM  Procedures: EEG 3/20 negative Continuous EEG 3/24 negative  Antibiotics: None  HPI/Subjective:  Patient awake and although confused much more appropriate today.  Objective: Blood pressure 141/69, pulse 60, temperature 98.4 F (36.9 C), temperature source Oral, resp. rate 22, height 5\' 10"  (1.778 m), weight 298 lb 15.1 oz (135.6 kg), SpO2 92.00%.  Intake/Output Summary (Last 24 hours) at 12/31/13 1451 Last data filed at 12/31/13 1113  Gross per 24 hour  Intake    550 ml  Output    725 ml  Net   -175 ml     Exam: General: No acute respiratory distress Lungs: Coarse to auscultation bilaterally without wheezes or crackles, 4 L Cardiovascular: Regular rate and rhythm without murmur gallop or rub normal S1 and S2, no peripheral edema or JVD Abdomen: Nontender, nondistended, soft, bowel sounds positive, no rebound,  no ascites, no appreciable mass Musculoskeletal: No significant cyanosis, clubbing of bilateral lower  extremities Neurological: Awakens and is verbally communicative, confused  Scheduled Meds:  Scheduled Meds: . amantadine  100 mg Oral BID  . antiseptic oral rinse  15 mL Mouth Rinse q12n4p  . antiseptic oral rinse  15 mL Mouth Rinse BID  . carvedilol  3.125 mg Oral BID WC  . chlorhexidine  15 mL Mouth Rinse BID  . pantoprazole (PROTONIX) IV  40 mg Intravenous Q12H  . risperiDONE  0.25 mg Oral Daily  . valproate sodium  500 mg Intravenous 3 times per day   Continuous Infusions: . sodium chloride 50 mL/hr at 12/30/13 1227    Data Reviewed: Basic Metabolic Panel:  Recent Labs Lab 12/25/13 0400 12/26/13 0320 12/27/13 0621 12/28/13 0555 12/30/13 0315 12/31/13 0332  NA 140 144 146 146 146 147  K 4.6 3.7 3.4* 3.6* 3.7 3.8  CL 98 100 99 102 103 102  CO2 31 33* 35* 34* 33* 32  GLUCOSE 170* 145* 121* 121* 113* 130*  BUN 6 17 17 14 13 15   CREATININE 0.71 0.85 0.70 0.64 0.66 0.73  CALCIUM 8.5 8.4 8.2* 8.2* 8.4 8.5  MG  --  2.2 2.3  --  2.5  --   PHOS  --  0.5* 2.4  --  2.3  --    Liver Function Tests:  Recent Labs Lab 12/31/13 0332  AST 31  ALT 21  ALKPHOS 62  BILITOT 1.0  PROT 7.1  ALBUMIN 3.0*   No results found for this basename: LIPASE, AMYLASE,  in the last 168 hours No results found for this basename: AMMONIA,  in the last 168 hours CBC:  Recent Labs Lab 12/26/13 0320 12/27/13 0621 12/28/13 0555 12/30/13 0315 12/31/13 0332  WBC 11.6* 11.1* 10.2 10.9* 10.4  HGB 15.0 15.7 15.4 15.1 15.4  HCT 45.9 47.8 47.4 46.2 46.7  MCV 102.5* 103.2* 103.7* 102.4* 103.3*  PLT 157 164 182 193 202   Cardiac Enzymes: No results found for this basename: CKTOTAL, CKMB, CKMBINDEX, TROPONINI,  in the last 168 hours BNP (last 3 results)  Recent Labs  12/25/13 0400  PROBNP 577.4*   CBG:  Recent Labs Lab 12/30/13 2213 12/30/13 2332 12/31/13 0326 12/31/13 0730 12/31/13 1125  GLUCAP 92 125* 138* 121* 131*    Recent Results (from the past 240 hour(s))  MRSA PCR  SCREENING     Status: None   Collection Time    12/24/13  2:06 PM      Result Value Ref Range Status   MRSA by PCR NEGATIVE  NEGATIVE Final   Comment:            The GeneXpert MRSA Assay (FDA     approved for NASAL specimens     only), is one component of a     comprehensive MRSA colonization     surveillance program. It is not     intended to diagnose MRSA     infection nor to guide or     monitor treatment for     MRSA infections.     Studies:  Recent x-ray studies have been reviewed in detail by the Attending Physician  Time spent :     Junious Silk, ANP [facility] Office  [phone] Pager [phone]  **If unable to reach the above provider after paging please contact the Flow Manager @ [phone]  On-Call/Text Page:      [email or web address]      password  [email or web address]  If 7PM-7AM, please contact night-coverage [email or web address] Password [email or web address] 12/31/2013, 2:51 PM   LOS: 7 days   I have examined the patient, reviewed the chart and modified the above note which I agree with.   Page Lancon,MD Pager # on [email or web address] 12/31/2013, 5:40 PM

## 2013-12-31 NOTE — Progress Notes (Signed)
LTM EEG D/C'd. EEG computer was turned off when tech entered pts room.

## 2013-12-31 NOTE — Progress Notes (Signed)
Subjective: [place]us to improve. He remembers having EEG but not the breathing tube.   Exam: Filed Vitals:   12/31/13 1805  BP: 184/83  Pulse: 73  Temp: 97.7 F (36.5 C)  Resp: 22   Gen: In bed, NAD MS: Awakens to minor stimuli, follows commands in all four extremities. Responds slightly more quickly today than previous days.  AV:WUJWJCN:PERRL, does cross midline both directions Motor: withdraws all extermities to noxious stimuli and follows commands x 4.  Sensory: as above.   MRI limited by motion, but does not show any significant change.   Impression: 68 yo M with CHI and AMS. 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 There has been some concern for episode of unresponsiveness, but it is not clear that there has been any siezure activity. 48 hour EEG shows no subclinical seizures.   Sometimes depakote can be useful in delirium and it may be reasonable to try this instead of keppra. It is now 7 days post injury and there has been no clear indication that he has had seizures. Depakote sometimes help with delirium, so I'll reduce it for 3 days and then stop it.   If he were to redevelop staring spells, then this would need to be considered at that time.  Recommendations: 1) decreased Depakote to 500 mg twice a day for 3 days then discontinue 2) will likely need prolonged rehab 3) amantadine 100 mg twice a day 4) neurology will sign off this time please call if any further questions or concerns remain.  Ritta SlotMcNeill Thadeus Gandolfi, MD [facility] [phone]  If 7pm- 7am, please page neurology on call at [phone].

## 2013-12-31 NOTE — Clinical Social Work Note (Signed)
Patient transferred to 4N, CSW left voicemail for 4N CSW to give handoff report. CSW signing off at this time.  Roddie McBryant Kimyata Milich, Rome[place]CSWA, [place]AS[place], [phone]

## 2013-12-31 NOTE — Procedures (Signed)
Long term Video EEG Monitoring  12/30/2013 to 12/31/2013  HPI: This is a 68 year old male who presents with increased confusion after a fall. Cranial CT revealed a small subdural hemorrhage.  Medication: Depacon, Risperdal, Ativan, Fentanyl   Technique: This was a continuous 16 hour video EEG monitoring with 18 channels of EEG and 1 channel of EKG. It was performed during a state of stupor. Neither photic stimulation nor hyperventilation were performed as activating procedures.  Description: As the tracing opens the background consists of some generalized 6 to 7 Hertz delta theta activity with a voltage range of 7 to 63 microvolts. This activity was symmetric bilaterally. Superimposed on this background were some intermittent triphasic waves. There were no asymmetries nor epileptiform discharges noted. There were no electrographic seizures noted. The recording did appear to be reactive to somatosensory stimulation. The recording ended abruptly at 12:14 am when the machine was turned off.  Impression: This was an abnormal 16 hour video EEG monitoring as demonstrated by the presence of some moderate diffuse slowing. There were no push button events recorded.  Clinical Correlation: The above findings are consistent with a diffuse or multifocal encephalopathy as can be seen with medication effect, infection or other diffuse metabolic abnormalities.   Di Kindlehristopher S. Glori Luisonnelly M D.

## 2014-01-01 ENCOUNTER — Inpatient Hospital Stay (HOSPITAL_COMMUNITY): Payer: Medicare Other

## 2014-01-01 LAB — GLUCOSE, CAPILLARY
GLUCOSE-CAPILLARY: 118 mg/dL — AB (ref 70–99)
Glucose-Capillary: 274 mg/dL — ABNORMAL HIGH (ref 70–99)
Glucose-Capillary: 162 mg/dL — ABNORMAL HIGH (ref 70–99)
Glucose-Capillary: 129 mg/dL — ABNORMAL HIGH (ref 70–99)
Glucose-Capillary: 122 mg/dL — ABNORMAL HIGH (ref 70–99)

## 2014-01-01 MED ORDER — STARCH (THICKENING) PO POWD
ORAL | Status: DC | PRN
  Filled 2014-01-01: qty 227

## 2014-01-01 MED ORDER — PANTOPRAZOLE SODIUM 40 MG PO TBEC
40.0000 mg | DELAYED_RELEASE_TABLET | Freq: Two times a day (BID) | ORAL | Status: DC
  Administered 2014-01-01 (×2): 40 mg via ORAL
  Filled 2014-01-01 (×2): qty 1

## 2014-01-01 NOTE — Progress Notes (Signed)
Noted sitter at bedside as of 11 am due to pt pulling at lines. I await [employer] approval to admit pt to inpt rehab and I spoke with his wife by phone and she is aware and in agreement to plan. [phone]

## 2014-01-01 NOTE — Progress Notes (Signed)
[name]  PENNY FRISBIE WUJ:[id number] DOB: Jan 31, 1946 DOA: 12/24/2013 PCP: No primary provider on file.  Brief narrative: 68 year male patient who presented to the emergency department after falling. History was obtained from the patient's wife. According to the wife she heard a noise and found her husband lying at the bottom of the stairwell. His estimated he fell he stated 10 steps. He was breathing but unconscious. She reported that the patient had been expressing upper respiratory symptoms for a few days.  Upon evaluation in the ER imaging revealed a small traumatic subarachnoid hemorrhage as well as a subdural hematoma. On exam he was confused but his speech was clear. He had been somewhat agitated and had been given 2 doses of Ativan.   After admission the patient's mental status quickly deteriorated and on the morning of 12/25/2013 patient was unresponsive. ABG revealed hypercarbic respiratory failure with a PCO2 in the 90s. He was transferred to the ICU and intubated emergently. Since that time patient has had persistent metabolic encephalopathy. There was some concern of her seizures being the cause of this and initial EEG was negative. He's been empirically started on Keppra.  SIGNIFICANT EVENTS / STUDIES:  3/18 CT head- Small SAH Left posterior parietal, Persistent SDH along falx and tentorium.  3/19 CT head- No change in amount of intracranial blood, no hydrocephalus.  3/21 MRI >>subarachnoid blood over the vertex and within the interhemispheric fissure   HPI/Subjective:  Sleeping but RN says was awake and ate breakfast earlier  Assessment/Plan: Active Problems:   SAH (subarachnoid hemorrhage) -Initially evaluated by neurosurgery at time of admission with no indications for surgical intervention -Neurology also following-suspect altered mentation related to traumatic brain injury and recent hypercarbic respiratory failure (see  below) -PT/OT rec CIR-insurance approval pending but if not accepted will need SNF bed    Acute respiratory failure with hypoxia and hypercarbia - due to encephalopathy/ atelectasis?  -So far stable and much more alert than on 12/25/2013 -Continue nasal cannula oxygen-check CXR in am- add IS as he is now alert -Be careful not to give too much oxygen because we suspect this patient has underlying sleep apnea    Metabolic encephalopathy -Neurology suspects multifactorial delirium in setting of traumatic brain injury and hypercarbic respiratory failure -Initial EEG without seizure activity and continuous EEG also more consistent with metabolic encephalopathy -much improved 3/25-begin diet -Continue amantadine and Risperdal per Neuro -had agitation overnight and pulled out CL- would like to avoid continued sedation so have dc'd Haldol and Ativan for now    Morbid obesity -As above sleep apnea undiagnosed is suspected    HTN (hypertension) -Not on medications prior to admission -Continue carvedilol   DVT prophylaxis: SCDs Code Status: Full Family Communication: With wife at bedside 3/24 Disposition Plan/Expected LOS: Transfer to floor-CIR vs SNF   Consultants: Neurosurgery-signed off Neurology PCCM  Procedures: EEG 3/20 negative Continuous EEG 3/24 negative  Antibiotics: None  Objective: Blood pressure 130/46, pulse 60, temperature 99.1 F (37.3 C), temperature source Oral, resp. rate 20, height 5\' 10"  (1.778 m), weight 298 lb 15.1 oz (135.6 kg), SpO2 93.00%.  Intake/Output Summary (Last 24 hours) at 01/01/14 1532 Last data filed at 01/01/14 0900  Gross per 24 hour  Intake    220 ml  Output      0 ml  Net    220 ml     Exam: General: No acute respiratory distress Lungs: Coarse to auscultation bilaterally  without wheezes or crackles, 4 L Cardiovascular: Regular rate and rhythm without murmur gallop or rub normal S1 and S2, no peripheral edema or JVD Abdomen:  Nontender, nondistended, soft, bowel sounds positive, no rebound, no ascites, no appreciable mass Musculoskeletal: No significant cyanosis, clubbing of bilateral lower extremities Neurological: Awakens but sleepy today   Scheduled Meds:  Scheduled Meds: . amantadine  100 mg Oral BID  . antiseptic oral rinse  15 mL Mouth Rinse q12n4p  . antiseptic oral rinse  15 mL Mouth Rinse BID  . carvedilol  3.125 mg Oral BID WC  . chlorhexidine  15 mL Mouth Rinse BID  . pantoprazole  40 mg Oral BID  . risperiDONE  0.25 mg Oral Daily  . valproate sodium  500 mg Intravenous Q12H   Continuous Infusions: . sodium chloride 20 mL/hr at 12/31/13 1715    Data Reviewed: Basic Metabolic Panel:  Recent Labs Lab 12/26/13 0320 12/27/13 [id number] 12/28/13 0555 12/30/13 0315 12/31/13 0332  NA 144 146 146 146 147  K 3.7 3.4* 3.6* 3.7 3.8  CL 100 99 102 103 102  CO2 33* 35* 34* 33* 32  GLUCOSE 145* 121* 121* 113* 130*  BUN 17 17 14 13 15   CREATININE 0.85 0.70 0.64 0.66 0.73  CALCIUM 8.4 8.2* 8.2* 8.4 8.5  MG 2.2 2.3  --  2.5  --   PHOS 0.5* 2.4  --  2.3  --    Liver Function Tests:  Recent Labs Lab 12/31/13 0332  AST 31  ALT 21  ALKPHOS 62  BILITOT 1.0  PROT 7.1  ALBUMIN 3.0*   No results found for this basename: LIPASE, AMYLASE,  in the last 168 hours No results found for this basename: AMMONIA,  in the last 168 hours CBC:  Recent Labs Lab 12/26/13 0320 12/27/13 0621 12/28/13 0555 12/30/13 0315 12/31/13 0332  WBC 11.6* 11.1* 10.2 10.9* 10.4  HGB 15.0 15.7 15.4 15.1 15.4  HCT 45.9 47.8 47.4 46.2 46.7  MCV 102.5* 103.2* 103.7* 102.4* 103.3*  PLT 157 164 182 193 202   Cardiac Enzymes: No results found for this basename: CKTOTAL, CKMB, CKMBINDEX, TROPONINI,  in the last 168 hours BNP (last 3 results)  Recent Labs  12/25/13 0400  PROBNP 577.4*   CBG:  Recent Labs Lab 12/31/13 1613 12/31/13 2201 01/01/14 0644 01/01/14 1001 01/01/14 1236  GLUCAP 114* 148* 118* 274* 162*     Recent Results (from the past 240 hour(s))  MRSA PCR SCREENING     Status: None   Collection Time    12/24/13  2:06 PM      Result Value Ref Range Status   MRSA by PCR NEGATIVE  NEGATIVE Final   Comment:            The GeneXpert MRSA Assay (FDA     approved for NASAL specimens     only), is one component of a     comprehensive MRSA colonization     surveillance program. It is not     intended to diagnose MRSA     infection nor to guide or     monitor treatment for     MRSA infections.     Studies:  Recent x-ray studies have been reviewed in detail by the Attending Physician  Time spent :     Junious Silkllison Ellis, ANP [facility] Office  [phone] Pager [phone]  **If unable to reach the above provider after paging please contact the Flow Manager @ [phone]  On-Call/Text Page:  [email or web address]      password [email or web address]  If 7PM-7AM, please contact night-coverage [email or web address] Password [facility] 01/01/2014, 3:32 PM   LOS: 8 days   I have examined the patient, reviewed the chart and modified the above note which I agree with.   Amil Bouwman,MD Pager # on [email or web address] 01/01/2014, 6:34 PM

## 2014-01-01 NOTE — Progress Notes (Signed)
Speech Language Pathology Treatment: Dysphagia;Cognitive-Linquistic  Patient Details Name: Maurice Paul MRN: [id number] DOB: 04/24/1946 Today's Date: 01/01/2014 Time: [id number] SLP Time Calculation (min): 20 min  Assessment / Plan / Recommendation Clinical Impression  Pt. Seen for cognitive and swallow therapy following repositioning pt. In bed with assist.  He required hand over hand tactile assist for drinking and is consistently demonstrating decreased airway protection with thin liquids despite full assist with SLP and RN tech.  Pt. is unable to recognize or compensate for difficulty due to cognitive deficits.  He requires max-total cueing for spatial and situational orientation and max assist to sustain attention to speaker and activity.  Expression reflects pt.'s confusion with decreased semantics and confabulations. SLP downgraded liquid consistency to nectar and continue Dys 1. Continue treatment for cognitive-communicative and swallow abilities.        HPI HPI: 68 y.o. male with no significant PMH who presents after a fall.  Per wife she heard a noise, she then found her husband lying in the stair. He fell probably from 8 to 10 steps of stair. He was breathing, but unconscious. She report that he has been congested for last few days.  CT Persistent changes of small subarachnoid hemorrhage in the left posterior parietal vertex,  Persistent subdural hematoma along the falx and tentorium, Falcine subdural hematoma has increased slightly in size. No prominent mass effect.  Intubated 2 days.    Pertinent Vitals WDL  SLP Plan  Continue with current plan of care    Recommendations Diet recommendations: Dysphagia 1 (puree);Nectar-thick liquid (liquids downgraded to nectar) Liquids provided via: Cup;No straw Medication Administration: Crushed with puree Supervision: Patient able to self feed;Full supervision/cueing for compensatory strategies;Trained caregiver to feed patient Compensations: Slow  rate;Small sips/bites;Check for pocketing Postural Changes and/or Swallow Maneuvers: Seated upright 90 degrees;Upright 30-60 min after meal              General recommendations: Rehab consult Oral Care Recommendations: Oral care BID Follow up Recommendations: Inpatient Rehab Plan: Continue with current plan of care    GO     Royce MacadamiaLisa Willis Dory Verdun M.Ed [employer]-SLP Pager [phone]  01/01/2014

## 2014-01-02 ENCOUNTER — Inpatient Hospital Stay (HOSPITAL_COMMUNITY): Payer: Medicare Other

## 2014-01-02 DIAGNOSIS — S066XAA Traumatic subarachnoid hemorrhage with loss of consciousness status unknown, initial encounter: Secondary | ICD-10-CM

## 2014-01-02 DIAGNOSIS — I6200 Nontraumatic subdural hemorrhage, unspecified: Secondary | ICD-10-CM

## 2014-01-02 DIAGNOSIS — G9340 Encephalopathy, unspecified: Secondary | ICD-10-CM

## 2014-01-02 DIAGNOSIS — R4182 Altered mental status, unspecified: Secondary | ICD-10-CM

## 2014-01-02 DIAGNOSIS — J9600 Acute respiratory failure, unspecified whether with hypoxia or hypercapnia: Secondary | ICD-10-CM

## 2014-01-02 DIAGNOSIS — S066X9A Traumatic subarachnoid hemorrhage with loss of consciousness of unspecified duration, initial encounter: Secondary | ICD-10-CM

## 2014-01-02 DIAGNOSIS — S069X9A Unspecified intracranial injury with loss of consciousness of unspecified duration, initial encounter: Secondary | ICD-10-CM

## 2014-01-02 DIAGNOSIS — S069XAA Unspecified intracranial injury with loss of consciousness status unknown, initial encounter: Secondary | ICD-10-CM

## 2014-01-02 LAB — PROCALCITONIN

## 2014-01-02 LAB — BLOOD GAS, ARTERIAL
ACID-BASE EXCESS: 13.4 mmol/L — AB (ref 0.0–2.0)
Acid-Base Excess: 13.8 mmol/L — ABNORMAL HIGH (ref 0.0–2.0)
BICARBONATE: 39.6 meq/L — AB (ref 20.0–24.0)
Bicarbonate: 40.1 mEq/L — ABNORMAL HIGH (ref 20.0–24.0)
DRAWN BY: 30599
Delivery systems: POSITIVE
Drawn by: 24486
Expiratory PAP: 8
FIO2: 0.4 %
INSPIRATORY PAP: 16
O2 Content: 4 L/min
O2 SAT: 97 %
O2 Saturation: 94.4 %
PATIENT TEMPERATURE: 101
PCO2 ART: 76.9 mmHg — AB (ref 35.0–45.0)
PH ART: 7.337 — AB (ref 7.350–7.450)
PO2 ART: 98.9 mmHg (ref 80.0–100.0)
Patient temperature: 98.6
TCO2: 42.5 mmol/L (ref 0–100)
TCO2: 41.9 mmol/L (ref 0–100)
pCO2 arterial: 80.1 mmHg (ref 35.0–45.0)
pH, Arterial: 7.323 — ABNORMAL LOW (ref 7.350–7.450)
pO2, Arterial: 73.4 mmHg — ABNORMAL LOW (ref 80.0–100.0)

## 2014-01-02 LAB — CBC
HEMATOCRIT: 47 % (ref 39.0–52.0)
HEMOGLOBIN: 15.3 g/dL (ref 13.0–17.0)
MCH: 34 pg (ref 26.0–34.0)
MCHC: 32.6 g/dL (ref 30.0–36.0)
MCV: 104.4 fL — AB (ref 78.0–100.0)
Platelets: 199 10*3/uL (ref 150–400)
RBC: 4.5 MIL/uL (ref 4.22–5.81)
RDW: 14 % (ref 11.5–15.5)
WBC: 9.1 10*3/uL (ref 4.0–10.5)

## 2014-01-02 LAB — URINALYSIS, ROUTINE W REFLEX MICROSCOPIC
GLUCOSE, UA: NEGATIVE mg/dL
Ketones, ur: 15 mg/dL — AB
NITRITE: NEGATIVE
PH: 5.5 (ref 5.0–8.0)
PROTEIN: 100 mg/dL — AB
Specific Gravity, Urine: 1.025 (ref 1.005–1.030)
Urobilinogen, UA: 1 mg/dL (ref 0.0–1.0)

## 2014-01-02 LAB — BASIC METABOLIC PANEL
BUN: 14 mg/dL (ref 6–23)
CALCIUM: 8.6 mg/dL (ref 8.4–10.5)
CO2: 38 mEq/L — ABNORMAL HIGH (ref 19–32)
CREATININE: 0.67 mg/dL (ref 0.50–1.35)
Chloride: 100 mEq/L (ref 96–112)
GFR calc Af Amer: >90 mL/min (ref >90)
GLUCOSE: 142 mg/dL — AB (ref 70–99)
Potassium: 4.2 mEq/L (ref 3.7–5.3)
SODIUM: 146 meq/L (ref 137–147)

## 2014-01-02 LAB — MRSA PCR SCREENING: MRSA BY PCR: NEGATIVE

## 2014-01-02 LAB — URINE MICROSCOPIC-ADD ON

## 2014-01-02 LAB — GLUCOSE, CAPILLARY
Glucose-Capillary: 130 mg/dL — ABNORMAL HIGH (ref 70–99)
Glucose-Capillary: 137 mg/dL — ABNORMAL HIGH (ref 70–99)
Glucose-Capillary: 109 mg/dL — ABNORMAL HIGH (ref 70–99)
Glucose-Capillary: 94 mg/dL (ref 70–99)

## 2014-01-02 LAB — VALPROIC ACID LEVEL: Valproic Acid Lvl: 31.6 ug/mL — ABNORMAL LOW (ref 50.0–100.0)

## 2014-01-02 MED ORDER — FENTANYL CITRATE 0.05 MG/ML IJ SOLN
INTRAMUSCULAR | Status: AC
  Administered 2014-01-02: 100 ug
  Filled 2014-01-02: qty 2

## 2014-01-02 MED ORDER — VANCOMYCIN HCL 10 G IV SOLR
1250.0000 mg | Freq: Two times a day (BID) | INTRAVENOUS | Status: DC
  Administered 2014-01-03 – 2014-01-04 (×3): 1250 mg via INTRAVENOUS
  Filled 2014-01-02 (×4): qty 1250

## 2014-01-02 MED ORDER — PROPOFOL 10 MG/ML IV EMUL
5.0000 ug/kg/min | INTRAVENOUS | Status: DC
  Administered 2014-01-02 – 2014-01-03 (×3): 20 ug/kg/min via INTRAVENOUS
  Filled 2014-01-02 (×2): qty 100

## 2014-01-02 MED ORDER — CHLORHEXIDINE GLUCONATE 0.12 % MT SOLN
15.0000 mL | Freq: Two times a day (BID) | OROMUCOSAL | Status: DC
  Administered 2014-01-03 – 2014-01-04 (×3): 15 mL via OROMUCOSAL
  Filled 2014-01-02 (×3): qty 15

## 2014-01-02 MED ORDER — MIDAZOLAM HCL 2 MG/2ML IJ SOLN
INTRAMUSCULAR | Status: AC
  Administered 2014-01-02: 2 mg
  Filled 2014-01-02: qty 2

## 2014-01-02 MED ORDER — VANCOMYCIN HCL 10 G IV SOLR
2000.0000 mg | Freq: Once | INTRAVENOUS | Status: AC
  Administered 2014-01-02: 2000 mg via INTRAVENOUS
  Filled 2014-01-02: qty 2000

## 2014-01-02 MED ORDER — ETOMIDATE 2 MG/ML IV SOLN
INTRAVENOUS | Status: AC
  Administered 2014-01-02: 20 mg
  Filled 2014-01-02: qty 10

## 2014-01-02 MED ORDER — PANTOPRAZOLE SODIUM 40 MG IV SOLR: 40.0000 mg | Freq: Every day | INTRAVENOUS | Status: DC

## 2014-01-02 MED ORDER — BIOTENE DRY MOUTH MT LIQD
15.0000 mL | Freq: Four times a day (QID) | OROMUCOSAL | Status: DC
  Administered 2014-01-03 – 2014-01-04 (×8): 15 mL via OROMUCOSAL

## 2014-01-02 MED ORDER — PROPOFOL 10 MG/ML IV EMUL
INTRAVENOUS | Status: AC
  Administered 2014-01-02: 20 ug/kg/min via INTRAVENOUS
  Filled 2014-01-02: qty 100

## 2014-01-02 MED ORDER — ETOMIDATE 2 MG/ML IV SOLN
0.3000 mg/kg | Freq: Once | INTRAVENOUS | Status: AC
  Administered 2014-01-02: 20 mg via INTRAVENOUS

## 2014-01-02 MED ORDER — FAMOTIDINE IN NACL 20-0.9 MG/50ML-% IV SOLN
20.0000 mg | Freq: Two times a day (BID) | INTRAVENOUS | Status: DC
  Administered 2014-01-02 – 2014-01-06 (×9): 20 mg via INTRAVENOUS
  Filled 2014-01-02 (×10): qty 50

## 2014-01-02 MED ORDER — PIPERACILLIN-TAZOBACTAM 3.375 G IVPB
3.3750 g | Freq: Three times a day (TID) | INTRAVENOUS | Status: DC
  Administered 2014-01-02 – 2014-01-05 (×9): 3.375 g via INTRAVENOUS
  Filled 2014-01-02 (×12): qty 50

## 2014-01-02 NOTE — Progress Notes (Signed)
SLP Cancellation Note  Patient Details Name: Maurice Paul MRN: [id number] DOB: 09/01/1946   Cancelled treatment:        Pt. markedly obtunded this morning per documentation and transferred to 3S.  ST will continue to treat as able.   Breck CoonsLisa Willis KenbridgeLitaker M.Ed [employer]-SLP Pager [phone]  01/02/2014

## 2014-01-02 NOTE — Progress Notes (Signed)
1128 pt spouse Graciella BeltonDianne called to be updated; spouse informed pt is moved to [employer]. P. Amo Sundi Slevin RN.

## 2014-01-02 NOTE — Progress Notes (Signed)
RT in to assess pt.  Minimal response from pt with sternal rub.  RN aware of pt status.  Revonda StandardAllison, NP made aware.

## 2014-01-02 NOTE — Progress Notes (Signed)
No response to BIPAP pt transferred to 21M RM 2.

## 2014-01-02 NOTE — Progress Notes (Signed)
PT Cancellation Note  Patient Details Name: Maurice Paul MRN: [id number] DOB: 02/10/1946   Cancelled Treatment:    Reason Eval/Treat Not Completed: Fatigue/lethargy limiting ability to participate.  Pt unresponsive at this time with RN in to assess and MD called.  Will hold PT at this time and check back another time.     Ariba Lehnen, Alison MurrayMegan F 01/02/2014, 8:50 AM

## 2014-01-02 NOTE — Progress Notes (Signed)
CRITICAL VALUE ALERT  Critical value received: PH 7.337, PO2 73.4, CO2 76.9. Bicarb 40.1  Date of notification: 01/02/14  Time of notification: 0928  Critical value read back:yes  Nurse who received alert:  Alphonzo DublinPriscilla Amo Ashari Llewellyn RN  MD notified (1st page):  Calvert CantorSaima Rizwan  Time of first page: 0930  MD notified (2nd page):  Time of second page:  Responding MD:  Calvert CantorSaima Rizwan  Time MD responded:  [phone]

## 2014-01-02 NOTE — Progress Notes (Signed)
[address] [place] [address] Report obtained from ClintonPriscilla, CaliforniaRN

## 2014-01-02 NOTE — Progress Notes (Signed)
Called and reported off to Broganaroline at [facility]. Dionne BucyP. Amo Tieshia Rettinger RN

## 2014-01-02 NOTE — Progress Notes (Signed)
Fentany 100mg ; atomidate 10cc; and versed 2mg  given prior to intubation

## 2014-01-02 NOTE — Progress Notes (Signed)
INITIAL NUTRITION ASSESSMENT  DOCUMENTATION CODES Per approved criteria  -Morbid Obesity   INTERVENTION:  If pt remains intubated recommend initiaing the 87M PEPuP protocol on the adult tube feeding protocol.   Utilize 87M PEPuP Protocol: initiate TF with Vital High Protein at 25 ml/h and Prostat 60 ml TID on day 1; on day 2, increase to goal rate of 45 ml/h (1080 ml per day) to provide 1780 kcal (23.6 kcal/kg), 199 gm protein, 902 ml free water daily.  NUTRITION DIAGNOSIS: Inadequate oral intake related to inability to eat as evidenced by NPO status  Goal: Enteral nutrition to provide 60-70% of estimated calorie needs (22-25 kcals/kg ideal body weight) and 100% of estimated protein needs, based on ASPEN guidelines for permissive underfeeding in critically ill obese individuals  Monitor:  Vent status, initiation and tolerance of TF, weight trend  Reason for Assessment: Ventilator   68 y.o. male  Admitting Dx: <principal problem not specified>  ASSESSMENT: Pt admitted 3/18 with traumatic SAH and SDH. 3/19 mental and respiratory status decompensated and PCCM has been asked to assist with his management, extubated 3/19.  Re-admitted to ICU after worsening neuro-status, and hypercarbic respiratory failure 3/27.  Patient is currently intubated on ventilator support MV: 11.5 L/min Temp (24hrs), Avg:99.2 F (37.3 C), Min:97.9 F (36.6 C), Max:100.5 F (38.1 C)  Nutrition-focused physical exam WNL.   Height: Ht Readings from Last 1 Encounters:  01/02/14 5\' 10"  (1.778 m)    Weight: Wt Readings from Last 1 Encounters:  01/02/14 296 lb 8.3 oz (134.5 kg)  Admission weight 298 lb (135.6 kg) 3/18  Ideal Body Weight: 75.4 kg   % Ideal Body Weight: 178%  Wt Readings from Last 10 Encounters:  01/02/14 296 lb 8.3 oz (134.5 kg)    Usual Body Weight: 298 lb   % Usual Body Weight: 100%  BMI:  Body mass index is 42.55 kg/(m^2).  Estimated Nutritional Needs: Kcal:  2327 Protein: >/= 188 grams Fluid: > 2 L/day  Skin: no issues noted  Diet Order: NPO Pt had been on a Dysphagia 1 Diet with Nectar thick liquids Intake had been 20-30% of meals.   EDUCATION NEEDS: -No education needs identified at this time   Intake/Output Summary (Last 24 hours) at 01/02/14 1449 Last data filed at 01/01/14 1700  Gross per 24 hour  Intake    360 ml  Output    400 ml  Net    -40 ml    Last BM: none recorded for 9 days   Labs:   Recent Labs Lab 12/27/13 0621  12/30/13 0315 12/31/13 0332 01/02/14 0601  NA 146  < > 146 147 146  K 3.4*  < > 3.7 3.8 4.2  CL 99  < > 103 102 100  CO2 35*  < > 33* 32 38*  BUN 17  < > 13 15 14   CREATININE 0.70  < > 0.66 0.73 0.67  CALCIUM 8.2*  < > 8.4 8.5 8.6  MG 2.3  --  2.5  --   --   PHOS 2.4  --  2.3  --   --   GLUCOSE 121*  < > 113* 130* 142*  < > = values in this interval not displayed.  CBG (last 3)   Recent Labs  01/01/14 2218 01/02/14 0649 01/02/14 0830  GLUCAP 122* 130* 137*    Scheduled Meds: . antiseptic oral rinse  15 mL Mouth Rinse q12n4p  . antiseptic oral rinse  15 mL Mouth Rinse  BID  . antiseptic oral rinse  15 mL Mouth Rinse QID  . carvedilol  3.125 mg Oral BID WC  . chlorhexidine  15 mL Mouth Rinse BID  . chlorhexidine  15 mL Mouth Rinse BID  . famotidine (PEPCID) IV  20 mg Intravenous Q12H  . piperacillin-tazobactam (ZOSYN)  IV  3.375 g Intravenous Q8H  . valproate sodium  500 mg Intravenous Q12H  . [START ON 01/03/2014] vancomycin  1,250 mg Intravenous Q12H  . vancomycin  2,000 mg Intravenous Once    Continuous Infusions: . sodium chloride 500 mL (01/02/14 0109)    Past Medical History  Diagnosis Date  . PVD (peripheral vascular disease)   . Tobacco chew use     for 30 years    Past Surgical History  Procedure Laterality Date  . Cyst removal trunk      Kendell Bane RD, LDN, CNSC [phone] Pager [phone] After Hours Pager

## 2014-01-02 NOTE — Progress Notes (Signed)
Patient transported to MRI and back without incident. Placed back on regualar vent settings, Will continue to monitor.

## 2014-01-02 NOTE — Procedures (Signed)
Central Venous Catheter Insertion Procedure Note- brady BP ok for now Maurice BaizeWalter S Kosloski [id number] 05/23/1946  Procedure: Insertion of Central Venous Catheter Indications: Assessment of intravascular volume, Drug and/or fluid administration and Frequent blood sampling  Procedure Details Consent: Risks of procedure as well as the alternatives and risks of each were explained to the (patient/caregiver).  Consent for procedure obtained. Time Out: Verified patient identification, verified procedure, site/side was marked, verified correct patient position, special equipment/implants available, medications/allergies/relevent history reviewed, required imaging and test results available.  Performed  Maximum sterile technique was used including antiseptics, cap, gloves, gown, hand hygiene, mask and sheet. Skin prep: Chlorhexidine; local anesthetic administered A antimicrobial bonded/coated triple lumen catheter was placed in the left internal jugular vein using the Seldinger technique.  Evaluation Blood flow good Complications: No apparent complications Patient did tolerate procedure well. Chest X-ray ordered to verify placement.  CXR: pending.  Nelda BucksFEINSTEIN,DANIEL J. 01/02/2014, 5:38 PM

## 2014-01-02 NOTE — Progress Notes (Signed)
PULMONARY / CRITICAL CARE MEDICINE   Name: Maurice Paul MRN: [id number] DOB: [birth year]    ADMISSION DATE:  12/24/2013 CONSULTATION DATE:  12/25/2013  REFERRING MD :  [name] PRIMARY SERVICE: PCCM  CHIEF COMPLAINT:  SAH  BRIEF PATIENT DESCRIPTION:  68 year old male with admitted 3/18 with traumatic SAH and SDH. 3/19 mental and respiratory status decompensated and PCCM has been asked to assist with his management.  >re-admitted to ICU after worsening neuro-status, and hypercarbic respiratory failure 3/27  SIGNIFICANT EVENTS / STUDIES:  3/18 CT head- Small SAH Left posterior parietal, Persistent SDH along falx and tentorium. 3/19 CT head- No change in amount of intracranial blood, no hydrocephalus.  3/21 MRI >>subarachnoid blood over the vertex and within the interhemispheric fissure 3/23: failed bedside swallow eval/ PCCM signed off  3/24-3/25 continuous EEG: finding consistent with a moderate diffuse or multifocal encephalopathy as can be seen secondary to medication effect, infection or other diffuse metabolic abnormalities. No seizure  3/25 mental status had improved 3/25 MRI: depakote reduced, neurology signed off.  3/26: increased agitation. Pulled out CVL  3/27 PCCM asked to see for hypercarbic resp failure and worsening encephalopathy  3/27 CT head: resolving SAH   LINES / TUBES: ETT 3/19 >> 3/20; 3/27>>> [facility] 3/19 >>3/23>>>  CULTURES: Sputum 3/27>>>  ANTIBIOTICS: vanc 3/27>>> Zosyn 3/27>>>  SUBJECTIVE:  Sedated on vent   VITAL SIGNS: Temp:  [97.9 F (36.6 C)-100.5 F (38.1 C)] 100 F (37.8 C) (03/27 1130) Pulse Rate:  [60-73] 62 (03/27 0934) Resp:  [18-25] 25 (03/27 0934) BP: (130-184)/(46-79) 136/56 mmHg (03/27 1130) SpO2:  [93 %-96 %] 95 % (03/27 0934) FiO2 (%):  [100 %] 100 % (03/27 1300)  HEMODYNAMICS:    VENTILATOR SETTINGS: Vent Mode:  [-] PRVC FiO2 (%):  [100 %] 100 % Set Rate:  [20 bmp] 20 bmp Vt Set:  [600 mL] 600 mL PEEP:  [5 cmH20] 5  cmH20  INTAKE / OUTPUT: Intake/Output     03/26 0701 - 03/27 0700 03/27 0701 - 03/28 0700   P.O. 460    Total Intake(mL/kg) 460 (3.4)    Urine (mL/kg/hr) 400 (0.1)    Total Output 400     Net +60           PHYSICAL EXAMINATION: General:  Chronically ill appearing, arousable. Neuro:  Sedated on vent  HEENT:  Orally intubated  Cardiovascular:  RRR, Nl S1/S2, -M/R/G. Lungs: scattered rhonchi  Abdomen:  Obese, soft non-distended Musculoskeletal:  No acute deformity Skin:  Intact.  LABS:  CBC  Recent Labs Lab 12/30/13 0315 12/31/13 0332 01/02/14 0601  WBC 10.9* 10.4 9.1  HGB 15.1 15.4 15.3  HCT 46.2 46.7 47.0  PLT 193 202 199   Coag's No results found for this basename: APTT, INR,  in the last 168 hours BMET  Recent Labs Lab 12/30/13 0315 12/31/13 0332 01/02/14 0601  NA 146 147 146  K 3.7 3.8 4.2  CL 103 102 100  CO2 33* 32 38*  BUN 13 15 14   CREATININE 0.66 0.73 0.67  GLUCOSE 113* 130* 142*   Electrolytes  Recent Labs Lab 12/27/13 0621  12/30/13 0315 12/31/13 0332 01/02/14 0601  CALCIUM 8.2*  < > 8.4 8.5 8.6  MG 2.3  --  2.5  --   --   PHOS 2.4  --  2.3  --   --   < > = values in this interval not displayed. Sepsis Markers No results found for this basename: LATICACIDVEN, PROCALCITON,  O2SATVEN,  in the last 168 hours ABG  Recent Labs Lab 12/26/13 1610 01/02/14 0846 01/02/14 1042  PHART 7.386 7.337* 7.323*  PCO2ART 60.2* 76.9* 80.1*  PO2ART 70.0* 73.4* 98.9   Liver Enzymes  Recent Labs Lab 12/31/13 0332  AST 31  ALT 21  ALKPHOS 62  BILITOT 1.0  ALBUMIN 3.0*   Cardiac Enzymes No results found for this basename: TROPONINI, PROBNP,  in the last 168 hours Glucose  Recent Labs Lab 01/01/14 1001 01/01/14 1236 01/01/14 1621 01/01/14 2218 01/02/14 0649 01/02/14 0830  GLUCAP 274* 162* 129* 122* 130* 137*    Imaging Ct Head Wo Contrast  01/02/2014   CLINICAL DATA:  Reason subarachnoid hemorrhage.  Now obtunded  EXAM: CT HEAD  WITHOUT CONTRAST  TECHNIQUE: Contiguous axial images were obtained from the base of the skull through the vertex without intravenous contrast.  COMPARISON:  CT head 12/24/2013  FINDINGS: Resolving subarachnoid hemorrhage, over the convexity and in the interhemispheric fissure. Tentorial hemorrhage also shows interval improvement. Findings are resolving.  No new hemorrhage compared with the prior study. Ventricle size is normal. No acute infarct or mass.  IMPRESSION: Resolving subarachnoid hemorrhage. No superimposed acute abnormality compared with prior studies.   Electronically Signed   By: Marlan Palau M.D.   On: 01/02/2014 11:22   Dg Chest Port 1 View  01/02/2014   CLINICAL DATA:  Fever  EXAM: PORTABLE CHEST - 1 VIEW  COMPARISON:  01/01/2014  FINDINGS: Bibasilar atelectasis is unchanged. Negative for heart failure or effusion. Negative for mass lesion.  IMPRESSION: Bibasilar atelectasis, unchanged.   Electronically Signed   By: Marlan Palau M.D.   On: 01/02/2014 11:45   Dg Chest Port 1 View  01/01/2014   CLINICAL DATA:  Short of breath and cough  EXAM: PORTABLE CHEST - 1 VIEW  COMPARISON:  DG CHEST 1V PORT dated 12/26/2013  FINDINGS: Endotracheal tube removed. Central venous catheter removed. Central basilar opacity improved. Better lung volumes. Cardiomegaly. No pneumothorax.  IMPRESSION: Improved bibasilar airspace disease.  Extubated.   Electronically Signed   By: Maryclare Bean M.D.   On: 01/01/2014 07:55   CXR: Bibasilar infiltrates right > left but actually improved  ASSESSMENT / PLAN:  PULMONARY A: Acute mild hypercarbic respiratory failure: etiology not clear. Meds? Sub-clinical seizure?  Possible aspiration pneumonia  P:   Full vent support  PAD protocol See ID section  abg does NOT explain his mental status Repeat abg post intubation  CARDIOVASCULAR A:  HTN Hypovolemia?  P:  - Keep SBP < 140 mm/Hg. - PRN hydralazine as ordered. - Low dose oral Coreg added. -may need  cvp  RENAL A:  R/o hypovolemia  P:  Trend chemistry  Supportive IVFs Allow pos balance  GASTROINTESTINAL A:  No acute issues P:   - PPI for Gi ppx - place OGT, start early TF   HEMATOLOGIC A:  No acute issues P:  - Monitor CBC, coags.  INFECTIOUS A:   New fever. Possible aspiration   P:   Sputum culture  PCT  Empiric HCAP coverage   ENDOCRINE A:   Hyperglycemia  P:   - SSI.  NEUROLOGIC A:   Traumatic SAH  And SDH -  Both improving by CT scan 3/27 Acute Encephalopathy , EEG neg for seizure.  >hypercarbia likely a contributing factor but not explain mental status changes P:   Supportive care Defer EEG and repeat MRI to neuro Will d/c symmetrel and risperadol  Dep level   Ccm time  40 min   Mcarthur Rossettianiel J. Tyson AliasFeinstein, MD, FACP Pgr: [phone] [facility]

## 2014-01-02 NOTE — Progress Notes (Signed)
ANTIBIOTIC CONSULT NOTE - INITIAL  Pharmacy Consult for vanc/zosyn Indication: pneumonia  No Known Allergies  Patient Measurements: Height: 5\' 10"  (177.8 cm) Weight: 296 lb 8.3 oz (134.5 kg) IBW/kg (Calculated) : 73 Adjusted Body Weight:   Vital Signs: Temp: 100 F (37.8 C) (03/27 1130) Temp src: Axillary (03/27 1130) BP: 104/56 mmHg (03/27 1400) Pulse Rate: 61 (03/27 1400) Intake/Output from previous day: 03/26 0701 - 03/27 0700 In: 460 [P.O.:460] Out: 400 [Urine:400] Intake/Output from this shift:    Labs:  Recent Labs  12/31/13 0332 01/02/14 0601  WBC 10.4 9.1  HGB 15.4 15.3  PLT 202 199  CREATININE 0.73 0.67   Estimated Creatinine Clearance: 120.3 ml/min (by C-G formula based on Cr of 0.67). No results found for this basename: VANCOTROUGH, Leodis BinetVANCOPEAK, VANCORANDOM, GENTTROUGH, GENTPEAK, GENTRANDOM, TOBRATROUGH, TOBRAPEAK, TOBRARND, AMIKACINPEAK, AMIKACINTROU, AMIKACIN,  in the last 72 hours   Microbiology: Recent Results (from the past 720 hour(s))  MRSA PCR SCREENING     Status: None   Collection Time    12/24/13  2:06 PM      Result Value Ref Range Status   MRSA by PCR NEGATIVE  NEGATIVE Final   Comment:            The GeneXpert MRSA Assay (FDA     approved for NASAL specimens     only), is one component of a     comprehensive MRSA colonization     surveillance program. It is not     intended to diagnose MRSA     infection nor to guide or     monitor treatment for     MRSA infections.    Medical History: Past Medical History  Diagnosis Date  . PVD (peripheral vascular disease)   . Tobacco chew use     for 30 years    Medications:  Scheduled:  . antiseptic oral rinse  15 mL Mouth Rinse q12n4p  . antiseptic oral rinse  15 mL Mouth Rinse BID  . antiseptic oral rinse  15 mL Mouth Rinse QID  . carvedilol  3.125 mg Oral BID WC  . chlorhexidine  15 mL Mouth Rinse BID  . chlorhexidine  15 mL Mouth Rinse BID  . pantoprazole (PROTONIX) IV  40 mg  Intravenous Daily  . valproate sodium  500 mg Intravenous Q12H   Infusions:  . sodium chloride 500 mL (01/02/14 0109)   Assessment: 68 [facility] He has been here since [facility]/18. He is now being tx to ICU for worsening neuro-status and resp failure. Empiric vanc and zosyn are ordered for PNA.   Goal of Therapy:  Vancomycin trough level 15-20 mcg/ml  Plan:   Vanc 2g IV x1 then 1.25g IV q12 Zosyn 3.375g IV q8 Vanc trough at steady state

## 2014-01-02 NOTE — Progress Notes (Signed)
During shift change this am pt was assessed to be sleeping quietly in bed, was informed by outgoing nurse pt is lethargic, at 0845 Rehab team call nurse to the room concerning difficulty arousing pt and pt not responding to painful stimuli. Upon assessment pt noted to be lethargic, open his eyes when his name was called but closed it right back. Pt had small cough when sternum rub but no eyes opening. Checked pt VS, CBG, pupils reactive to light. MD and NP in to assess pt; new order received for ABG, sitter remains at pt bedside, pt able to respond to RT when name is called. [phone] Lab calls back with some critical lab and MD was informed; new orders received to transfer pt, Rapid response RN and RT at bedside. Arabella MerlesP. Amo Kameren Baade RN.

## 2014-01-02 NOTE — Significant Event (Signed)
Rapid Response Event Note  Overview:  Aware of abnormal ABG on patient on 4N by Resp therapy dept - chart reviewed - went to bedside with Bipap and Charge RT Fayrene FearingJames Time Called: [phone] Arrival Time: 0930 Event Type: Respiratory;Neurologic  Initial Focused Assessment:  On arrival supine in bed - skin hot and moist - slight grimace to DPS - pupils 4 mm = and brisk reaction - resps shallow - little air movement heard - abd large soft - has spont cough - strong - very congested sounding - bil BS present but little air movement.  Dressing left neck DDI.  IV patent left wrist.  BP 183/78 HR 78 RR 20.  O2 sats 93% on 2 liter nasal cannula.     Interventions: Placed patient on BiPap - 16/8 per RT - 2nd IV line started NS lock left upper forearm time one attempt - rectal temp done 101.  Chart reviewed.  Gershon CullPriscilla RN updated Dr. Butler Denmarkizwan with new temp - suggested PCXR with lung sounds - order placed by Dr. Butler Denmarkizwan.  Requested SDU bed.  After one hour on Bipap patient still with minimal response - purposeful to DPS - will open eyes occassionaly but unable to keep them open or focus. BP 148/73 HR 61 O2 sats 93% on Bipap 16/8 with FiO2 at 40%. ABG repeated.  Results called to Dr. Butler Denmarkizwan - spoke on phone with Junious SilkAllison Ellis NP - suggested that patient go to ICU - in view that pCO2 is higher after Bipap with minimal neuro response.  Orders to continue to SDU.  Patient taken to CT scan - tol well.  To 2C14 - [facility] - update given - short threshold for intubation - will follow.  Gershon Cullriscilla RN has updated wife.     Event Summary: Name of Physician Notified: Dr. Butler Denmarkizwan at  (pta RRT)    at    Outcome: Transferred (Comment)  2C14     Moshe CiproBritt, Leotis Painebra L

## 2014-01-02 NOTE — Progress Notes (Signed)
[place]; report from  [facility]illa RN. Arrived on BIPAP accompanied by Rapid Response.

## 2014-01-02 NOTE — Progress Notes (Signed)
I came to bedside to assess pt for readiness to admit to inpt rehab today. Pt sleeping and not able to arouse even with painful sternal rub or pinching. I asked RN to come to room to see pt. I contacted Junious SilkAllison Ellis ANP and discussed my assessment. Request for Attending MD to assess pt condition. RN at bedside. [phone]

## 2014-01-02 NOTE — Progress Notes (Signed)
OT Cancellation Note  Patient Details Name: Jewel BaizeWalter S Wieneke MRN: [id number] DOB: 06/11/1946   Cancelled Treatment:    Reason Eval/Treat Not Completed: Medical issues which prohibited therapy - Pt with decreased responsiveness.  Will check back.   Angelene GiovanniConarpe, Khya Halls M Sequita [place] City[place], OTR/L [phone] 01/02/2014, 10:22 AM

## 2014-01-02 NOTE — Progress Notes (Signed)
[name]  HISAO DOO ZOX:[id number] DOB: 1946-04-09 DOA: 12/24/2013 PCP: No primary provider on file.  Brief narrative: 68 year male patient who presented to the emergency department after falling. History was obtained from the patient's wife. According to the wife she heard a noise and found her husband lying at the bottom of the stairwell. His estimated he fell he stated 10 steps. He was breathing but unconscious. She reported that the patient had been expressing upper respiratory symptoms for a few days.  Upon evaluation in the ER imaging revealed a small traumatic subarachnoid hemorrhage as well as a subdural hematoma. On exam he was confused but his speech was clear. He had been somewhat agitated and had been given 2 doses of Ativan.   After admission the patient's mental status quickly deteriorated and on the morning of 12/25/2013 patient was unresponsive. ABG revealed hypercarbic respiratory failure with a PCO2 in the 90s. He was transferred to the ICU and intubated emergently. Since that time patient has had persistent metabolic encephalopathy. There was some concern of her seizures being the cause of this and initial EEG was negative. He's been empirically started on Keppra.  SIGNIFICANT EVENTS / STUDIES:  3/18 CT head- Small SAH Left posterior parietal, Persistent SDH along falx and tentorium.  3/19 CT head- No change in amount of intracranial blood, no hydrocephalus.  3/21 MRI >>subarachnoid blood over the vertex and within the interhemispheric fissure   HPI/Subjective: Non repsonsive even to sternal rub.  Assessment/Plan: Active Problems:   Acute respiratory failure with hypoxia and hypercarbia - progressive in nature and much worse than yesterday-this is similar to findings one week ago when required intubation -STAT ABG with mild hypercarbia and mild acidemia- BiPAP placed and 1 hour later Rapid response RN at bedside requested ABG  which showed no improvement -concern that may be neurologically mediated given admission presentation so STAT CT head w/o contrast obtained and requested continue on SDU level in interim (see below) -spiked temp up to 101 today -on earlier exam bilateral lung sounds were clear and pt noted noted with spontaneous wet sounding cough - given AMS possibly aspirated at some point in the last 24 hours-STAT PCXR negative  -PCCM consulted after above measures were unrevealing for none respiratory causes for AMS-unfortunately suspect will need intubation once again    [facility] (subarachnoid hemorrhage) -Initially evaluated by neurosurgery at time of admission with no indications for surgical intervention -Neurology also following-suspect altered mentation related to traumatic brain injury and recent hypercarbic respiratory failure (see below) -as noted above worsened mentation-CT head stable and improved    Metabolic encephalopathy -Neurology suspected persistence of symptoms r/t multifactorial delirium in setting of traumatic brain injury and hypercarbic respiratory failure -Initial EEG without seizure activity and continuous EEG also more consistent with metabolic encephalopathy -much improved 3/25-begin diet but less alert 3/26 -Today markedly obtunded- we were notified by the CIR Team after they noticed profound MS changes this am during early rounds -Continue amantadine and Risperdal per Neuro-will likely need to be given per tube -had agitation overnight into 3/26 and pulled out CL- preferred to avoid continued sedation so dc'd Haldol and Ativan therefore oversedation not a contributing factor   Dehydration/fever ? Unclear etiology at this time: diff includes UTI vs aspiration -UA is abnormal and pt appears dehydrated; since normotensive will not give fluid challenge but will start IVFs at 100/hr -follow up on urine cx- check blood cx's too  Morbid obesity -As above -undiagnosed OSA is  suspected    HTN (hypertension) -Not on medications prior to admission -Continue carvedilol   DVT prophylaxis: SCDs Code Status: Full Family Communication: With wife at bedside 3/24, called wife prior to moving pt to SDU and explained changes and planned work up- called her back to notify her pt not improved and moving to ICU and PCCM assuming care- she is unable to come to the hospital due to acute respiratory illness Disposition Plan/Expected LOS: Transfer to ICU-PCCM to assume care   Consultants: Neurosurgery-signed off Neurology PCCM  Procedures: EEG 3/20 negative Continuous EEG 3/24 negative  Antibiotics: None  Objective: Blood pressure 136/56, pulse 62, temperature 100 F (37.8 C), temperature source Axillary, resp. rate 25, height 5\' 10"  (1.778 m), weight 298 lb 15.1 oz (135.6 kg), SpO2 95.00%.  Intake/Output Summary (Last 24 hours) at 01/02/14 1227 Last data filed at 01/01/14 1700  Gross per 24 hour  Intake    360 ml  Output    400 ml  Net    -40 ml     Exam: General: No acute respiratory distress such as tachypnea Lungs: Clear to auscultation bilaterally without wheezes or crackles, 4 L transitioned to BiPAP Cardiovascular: Regular rate and rhythm without murmur gallop or rub normal S1 and S2, no peripheral edema or JVD Abdomen: Nontender, nondistended, soft, bowel sounds positive, no rebound, no ascites, no appreciable mass Musculoskeletal: No significant cyanosis, clubbing of bilateral lower extremities Neurological: Obtunded but spontaneous cough noted  Scheduled Meds:  Scheduled Meds: . amantadine  100 mg Oral BID  . antiseptic oral rinse  15 mL Mouth Rinse q12n4p  . antiseptic oral rinse  15 mL Mouth Rinse BID  . carvedilol  3.125 mg Oral BID WC  . chlorhexidine  15 mL Mouth Rinse BID  . pantoprazole  40 mg Oral BID  . risperiDONE  0.25 mg Oral Daily  . valproate sodium  500 mg Intravenous Q12H   Continuous Infusions: . sodium chloride 500 mL  (01/02/14 0109)    Data Reviewed: Basic Metabolic Panel:  Recent Labs Lab 12/27/13 [id number] 12/28/13 0555 12/30/13 0315 12/31/13 0332 01/02/14 0601  NA 146 146 146 147 146  K 3.4* 3.6* 3.7 3.8 4.2  CL 99 102 103 102 100  CO2 35* 34* 33* 32 38*  GLUCOSE 121* 121* 113* 130* 142*  BUN 17 14 13 15 14   CREATININE 0.70 0.64 0.66 0.73 0.67  CALCIUM 8.2* 8.2* 8.4 8.5 8.6  MG 2.3  --  2.5  --   --   PHOS 2.4  --  2.3  --   --    Liver Function Tests:  Recent Labs Lab 12/31/13 0332  AST 31  ALT 21  ALKPHOS 62  BILITOT 1.0  PROT 7.1  ALBUMIN 3.0*   No results found for this basename: LIPASE, AMYLASE,  in the last 168 hours No results found for this basename: AMMONIA,  in the last 168 hours CBC:  Recent Labs Lab 12/27/13 0621 12/28/13 0555 12/30/13 0315 12/31/13 0332 01/02/14 0601  WBC 11.1* 10.2 10.9* 10.4 9.1  HGB 15.7 15.4 15.1 15.4 15.3  HCT 47.8 47.4 46.2 46.7 47.0  MCV 103.2* 103.7* 102.4* 103.3* 104.4*  PLT 164 182 193 202 199   Cardiac Enzymes: No results found for this basename: CKTOTAL, CKMB, CKMBINDEX, TROPONINI,  in the last 168 hours BNP (last 3 results)  Recent Labs  12/25/13 0400  PROBNP 577.4*   CBG:  Recent Labs Lab  01/01/14 1236 01/01/14 1621 01/01/14 2218 01/02/14 0649 01/02/14 0830  GLUCAP 162* 129* 122* 130* 137*    Recent Results (from the past 240 hour(s))  MRSA PCR SCREENING     Status: None   Collection Time    12/24/13  2:06 PM      Result Value Ref Range Status   MRSA by PCR NEGATIVE  NEGATIVE Final   Comment:            The GeneXpert MRSA Assay (FDA     approved for NASAL specimens     only), is one component of a     comprehensive MRSA colonization     surveillance program. It is not     intended to diagnose MRSA     infection nor to guide or     monitor treatment for     MRSA infections.     Studies:  Recent x-ray studies have been reviewed in detail by the Attending Physician  Time spent :     Junious Silk, ANP [facility] Office  [phone] Pager [phone]  **If unable to reach the above provider after paging please contact the Flow Manager @ [phone]  On-Call/Text Page:      [email or web address]      password [email or web address]  If 7PM-7AM, please contact night-coverage [email or web address] Password [facility] 01/02/2014, 12:27 PM   LOS: 9 days   I have examined the patient, reviewed the chart and modified the above note which I agree with.   Johnathan Tortorelli,MD Pager # [email or web address] 01/09/2014, 3:18 PM'

## 2014-01-02 NOTE — Progress Notes (Signed)
NEURO HOSPITALIST PROGRESS NOTE   SUBJECTIVE:                                                                                                                        Events of the last 24 hours noted: back to the ICU due to worsening mental status and hypercarbic respiratory failure requiring intubation. Neurology asked to reevaluate patient due to concerns of a potential neurological cause for the decline in mental status. At this moment, he is intubated on the vent.  OBJECTIVE:                                                                                                                           Vital signs in last 24 hours: Temp:  [97.9 F (36.6 C)-100.5 F (38.1 C)] 100 F (37.8 C) (03/27 1130) Pulse Rate:  [60-73] 64 (03/27 1505) Resp:  [17-25] 19 (03/27 1505) BP: (104-184)/(54-109) 110/57 mmHg (03/27 1505) SpO2:  [86 %-100 %] 100 % (03/27 1505) FiO2 (%):  [40 %-100 %] 40 % (03/27 1507) Weight:  [134.5 kg (296 lb 8.3 oz)] 134.5 kg (296 lb 8.3 oz) (03/27 1230)  Intake/Output from previous day: 03/26 0701 - 03/27 0700 In: 460 [P.O.:460] Out: 400 [Urine:400] Intake/Output this shift:   Nutritional status: NPO  Past Medical History  Diagnosis Date  . PVD (peripheral vascular disease)   . Tobacco chew use     for 30 years    Neurologic Exam:  Mental status: intubated on the vent.  CN 2-12: resist eye opening, pupils 2-3 mm bilaterally, reactive to light. No gaze preference. No nystagmus. Face seems to be symmetric. Tongue: intubated. Motor: moves all limbs spontaneously and symmetrically. Sensory: reacts to pain. DTR's: 1 all over. Plantars: upgoing. Coordination and gait: unable to test. No meningeal signs.  Lab Results: No results found for this basename: cbc, bmp, coags, chol, tri, ldl, hga1c   Lipid Panel No results found for this basename: CHOL, TRIG, HDL, CHOLHDL, VLDL, LDLCALC,  in the last 72  hours  Studies/Results: Ct Head Wo Contrast  01/02/2014   CLINICAL DATA:  Reason subarachnoid hemorrhage.  Now obtunded  EXAM: CT HEAD WITHOUT CONTRAST  TECHNIQUE: Contiguous axial images were obtained from the base of  the skull through the vertex without intravenous contrast.  COMPARISON:  CT head 12/24/2013  FINDINGS: Resolving subarachnoid hemorrhage, over the convexity and in the interhemispheric fissure. Tentorial hemorrhage also shows interval improvement. Findings are resolving.  No new hemorrhage compared with the prior study. Ventricle size is normal. No acute infarct or mass.  IMPRESSION: Resolving subarachnoid hemorrhage. No superimposed acute abnormality compared with prior studies.   Electronically Signed   By: Marlan Palau M.D.   On: 01/02/2014 11:22   Portable Chest Xray  01/02/2014   CLINICAL DATA:  Status post intubation  EXAM: PORTABLE CHEST - 1 VIEW  COMPARISON:  DG CHEST 1V PORT dated 01/02/2014  FINDINGS: The lung volumes are low. The endotracheal tube tip lies approximately 3.7 cm above the crotch of the carina. There is some crowding of the pulmonary interstitial markings which is not new. There are stable coarse lung markings in the right infrahilar region. The cardiac silhouette is mildly enlarged. The pulmonary vascularity is not engorged.  IMPRESSION: There is bilateral pulmonary hypoinflation. The endotracheal tube appears to be in appropriate position. Persistently increased pulmonary interstitial markings bilaterally and confluent parenchymal consolidation on the right are demonstrated.   Electronically Signed   By: David  [place]   On: 01/02/2014 14:29   Dg Chest Port 1 View  01/02/2014   CLINICAL DATA:  Fever  EXAM: PORTABLE CHEST - 1 VIEW  COMPARISON:  01/01/2014  FINDINGS: Bibasilar atelectasis is unchanged. Negative for heart failure or effusion. Negative for mass lesion.  IMPRESSION: Bibasilar atelectasis, unchanged.   Electronically Signed   By: Marlan Palau M.D.    On: 01/02/2014 11:45   Dg Chest Port 1 View  01/01/2014   CLINICAL DATA:  Short of breath and cough  EXAM: PORTABLE CHEST - 1 VIEW  COMPARISON:  DG CHEST 1V PORT dated 12/26/2013  FINDINGS: Endotracheal tube removed. Central venous catheter removed. Central basilar opacity improved. Better lung volumes. Cardiomegaly. No pneumothorax.  IMPRESSION: Improved bibasilar airspace disease.  Extubated.   Electronically Signed   By: Maryclare Bean M.D.   On: 01/01/2014 07:55    MEDICATIONS                                                                                                                       I have reviewed the patient's current medications.  ASSESSMENT/PLAN:                                                                                                           68 y/o who was  initially admitted to [facility] on 3/18 due to post traumatic small SHD/SAH, now readmitted to the ICU with worsening mental status and respiratory failure requiring intubation. His neuro-exam is non focal at this moment. Unclear if these changes related to enlargement of SDH, non convulsive seizures. Will get MRI brain and EEG. Agree with stopping amantadine and Risperdal. Will follow up.   Wyatt Portela, MD [facility] [phone]  01/02/2014, 3:27 PM

## 2014-01-02 NOTE — Progress Notes (Signed)
Report given to Brandy, RN.

## 2014-01-02 NOTE — Progress Notes (Signed)
eLink Physician-Brief Progress Note Patient Name: Maurice BaizeWalter S Silba DOB: 03/27/1946 MRN: [id number]  Date of Service  01/02/2014   HPI/Events of Note   Agitation, pulling at tubes, lines   eICU Interventions  Propofol gtt   Intervention Category Minor Interventions: Agitation / anxiety - evaluation and management  MCQUAID, DOUGLAS 01/02/2014, 7:38 PM

## 2014-01-02 NOTE — Procedures (Signed)
Intubation Procedure Note Maurice BaizeWalter S Paul [id number] 04/28/1946  Procedure: Intubation Indications: Respiratory insufficiency  Procedure Details Consent: emergency Time Out: Verified patient identification, verified procedure, site/side was marked, verified correct patient position, special equipment/implants available, medications/allergies/relevent history reviewed, required imaging and test results available.  Performed  Maximum sterile technique was used including gloves, gown, hand hygiene and mask.  MAC and 4    Evaluation Hemodynamic Status: BP stable throughout; O2 sats: stable throughout Patient's Current Condition: stable Complications: No apparent complications Patient did tolerate procedure well. Chest X-ray ordered to verify placement.  CXR: pending.   Nelda BucksFEINSTEIN,DANIEL J. 01/02/2014

## 2014-01-03 ENCOUNTER — Inpatient Hospital Stay (HOSPITAL_COMMUNITY): Payer: Medicare Other

## 2014-01-03 DIAGNOSIS — S069X9A Unspecified intracranial injury with loss of consciousness of unspecified duration, initial encounter: Secondary | ICD-10-CM

## 2014-01-03 DIAGNOSIS — S069XAA Unspecified intracranial injury with loss of consciousness status unknown, initial encounter: Secondary | ICD-10-CM

## 2014-01-03 LAB — POCT I-STAT 3, ART BLOOD GAS (G3+)
Acid-Base Excess: 12 mmol/L — ABNORMAL HIGH (ref 0.0–2.0)
BICARBONATE: 35 meq/L — AB (ref 20.0–24.0)
O2 Saturation: 94 %
Patient temperature: 99.5
TCO2: 36 mmol/L (ref 0–100)
pCO2 arterial: 37.2 mmHg (ref 35.0–45.0)
pH, Arterial: 7.583 — ABNORMAL HIGH (ref 7.350–7.450)
pO2, Arterial: 61 mmHg — ABNORMAL LOW (ref 80.0–100.0)

## 2014-01-03 LAB — BASIC METABOLIC PANEL
BUN: 19 mg/dL (ref 6–23)
CHLORIDE: 103 meq/L (ref 96–112)
CO2: 33 meq/L — AB (ref 19–32)
Calcium: 8.4 mg/dL (ref 8.4–10.5)
Creatinine, Ser: 0.78 mg/dL (ref 0.50–1.35)
GFR calc Af Amer: >90 mL/min (ref >90)
GFR calc non Af Amer: >90 mL/min (ref >90)
GLUCOSE: 116 mg/dL — AB (ref 70–99)
Potassium: 3.1 mEq/L — ABNORMAL LOW (ref 3.7–5.3)
SODIUM: 147 meq/L (ref 137–147)

## 2014-01-03 LAB — CBC
HCT: 44.7 % (ref 39.0–52.0)
HEMOGLOBIN: 14.6 g/dL (ref 13.0–17.0)
MCH: 33.6 pg (ref 26.0–34.0)
MCHC: 32.7 g/dL (ref 30.0–36.0)
MCV: 103 fL — AB (ref 78.0–100.0)
Platelets: 141 10*3/uL — ABNORMAL LOW (ref 150–400)
RBC: 4.34 MIL/uL (ref 4.22–5.81)
RDW: 13.6 % (ref 11.5–15.5)
WBC: 7.3 10*3/uL (ref 4.0–10.5)

## 2014-01-03 LAB — GLUCOSE, CAPILLARY
GLUCOSE-CAPILLARY: 108 mg/dL — AB (ref 70–99)
Glucose-Capillary: 110 mg/dL — ABNORMAL HIGH (ref 70–99)
Glucose-Capillary: 120 mg/dL — ABNORMAL HIGH (ref 70–99)
Glucose-Capillary: 123 mg/dL — ABNORMAL HIGH (ref 70–99)
Glucose-Capillary: 93 mg/dL (ref 70–99)

## 2014-01-03 LAB — MAGNESIUM: MAGNESIUM: 2.3 mg/dL (ref 1.5–2.5)

## 2014-01-03 LAB — PHOSPHORUS: PHOSPHORUS: 1.8 mg/dL — AB (ref 2.3–4.6)

## 2014-01-03 LAB — PROCALCITONIN

## 2014-01-03 MED ORDER — VITAL HIGH PROTEIN PO LIQD
1000.0000 mL | ORAL | Status: DC
  Filled 2014-01-03 (×3): qty 1000

## 2014-01-03 MED ORDER — POTASSIUM CHLORIDE 10 MEQ/50ML IV SOLN
10.0000 meq | INTRAVENOUS | Status: AC
  Administered 2014-01-03 (×4): 10 meq via INTRAVENOUS
  Filled 2014-01-03 (×4): qty 50

## 2014-01-03 MED ORDER — FENTANYL BOLUS VIA INFUSION
25.0000 ug | INTRAVENOUS | Status: DC | PRN
  Filled 2014-01-03: qty 50

## 2014-01-03 MED ORDER — MIDAZOLAM BOLUS VIA INFUSION
1.0000 mg | INTRAVENOUS | Status: DC | PRN
  Filled 2014-01-03: qty 2

## 2014-01-03 MED ORDER — MIDAZOLAM HCL 2 MG/2ML IJ SOLN
INTRAMUSCULAR | Status: AC
  Administered 2014-01-03: 2 mg via INTRAVENOUS
  Filled 2014-01-03: qty 2

## 2014-01-03 MED ORDER — PROPOFOL 10 MG/ML IV EMUL
INTRAVENOUS | Status: AC
  Administered 2014-01-03: 20 ug/kg/min via INTRAVENOUS
  Filled 2014-01-03: qty 100

## 2014-01-03 MED ORDER — PROPOFOL 10 MG/ML IV EMUL: 5.0000 ug/kg/min | INTRAVENOUS | Status: DC

## 2014-01-03 MED ORDER — PROPOFOL 10 MG/ML IV EMUL
5.0000 ug/kg/min | INTRAVENOUS | Status: DC
  Administered 2014-01-03: 20 ug/kg/min via INTRAVENOUS
  Administered 2014-01-04: 5 ug/kg/min via INTRAVENOUS

## 2014-01-03 MED ORDER — PRO-STAT SUGAR FREE PO LIQD
60.0000 mL | Freq: Three times a day (TID) | ORAL | Status: DC
  Administered 2014-01-04 (×2): 60 mL
  Filled 2014-01-03 (×5): qty 60

## 2014-01-03 MED ORDER — FENTANYL CITRATE 0.05 MG/ML IJ SOLN
50.0000 ug | Freq: Once | INTRAMUSCULAR | Status: AC
  Administered 2014-01-03: 50 ug via INTRAVENOUS

## 2014-01-03 MED ORDER — SODIUM CHLORIDE 0.9 % IV SOLN
0.0000 ug/h | INTRAVENOUS | Status: DC
  Administered 2014-01-03: 50 ug/h via INTRAVENOUS
  Filled 2014-01-03: qty 50

## 2014-01-03 MED ORDER — MIDAZOLAM HCL 2 MG/2ML IJ SOLN
1.0000 mg | INTRAMUSCULAR | Status: DC | PRN
  Administered 2014-01-03 (×3): 2 mg via INTRAVENOUS
  Filled 2014-01-03 (×2): qty 2

## 2014-01-03 NOTE — Procedures (Signed)
EEG report.  Brief clinical history:  68 y/o who was [facility] due to post traumatic small SHD/SAH, now readmitted to the ICU with worsening mental status and respiratory failure requiring intubation.  Technique: this is a 17 channel routine scalp EEG performed at the bedside in the ICU setting, with bipolar and monopolar montages arranged in accordance to the international 10/20 system of electrode placement. One channel was dedicated to EKG recording.  Patient is intubated on the vent, sedated with propofol which couldn't be stop during recording as the patient became agitated. No activating procedures performed.  Description:as the study begins and for the most part of the recording, there is generalized, continuous, monomorphic delta slowing that almost at the end of the test becomes theta activity. No epileptiform discharges noted. No electrographic seizures noted.  EKG showed sinus rhythm.  Impression: this is an abnormal EEG with findings supportive of a moderately severe but non specific encephalopathy. No electrographic seizures noted. Please, be aware that the recording was performed while patient on propofol. Clinical correlation is advised.  Wyatt Portelasvaldo Atreus Hasz, MD

## 2014-01-03 NOTE — Progress Notes (Signed)
eLink Physician-Brief Progress Note Patient Name: Maurice BaizeWalter S Paul DOB: 02/26/1946 MRN: [id number]  Date of Service  01/03/2014   HPI/Events of Note  Versed pushes not holding him, near extubation   eICU Interventions  Propofol gtt Watch HR Too brady with precedex in the past   Intervention Category Major Interventions: Delirium, psychosis, severe agitation - evaluation and management  Maurice Paul V. 01/03/2014, 8:02 PM

## 2014-01-03 NOTE — Progress Notes (Signed)
[facility], with his belongings was an empty denture cup. The pt was already intubated when he arrived on the unit and his dentures were not in place. The patient's wife has expressed great concern regarding the loss of the dentures and states that she realizes that they can be replaced with a lost personal property claim through the hospital; however, what concerns her is her husband getting discharged home without having the dentures to eat. I have discussed this with the wife on several occations and have told her that I would do my best to try and track down the lost dentures but also reinforced that its possible that they could have been carried away on a meal tray or lost in linens and thrown in the dirty laundry bag. I have contacted [facility] regarding the lost item due to the wife reporting that the last time she saw him with his dentures in was when he was on [facility]. I spoke with the charge RN and she stated that they have not had any dentures turned into the front desk and that none of the RNs that took care of this pt where present today. I asked that she make a note and pass the info about the lost item on to the following charge RNs so that the RNs who took care of [name] during his stay there can be asked if they have any idea what may have happened to them.  Also contacted security and [facility] and they report not having any dentures turned into them either. Will let the pts wife know that she will most likely have to contact security to file a lost item report to try and get them replaced.

## 2014-01-03 NOTE — Progress Notes (Signed)
NEURO HOSPITALIST PROGRESS NOTE   SUBJECTIVE:                                                                                                                        Agitated overnight requiring propofol. He is alert and follows simple commands despite being sedated with propofol and intubated on the vent. MRI brain showed no acute abnormality and persistent small volume of intracranial hemorrhage, both  subarachnoid and subdural. Trace intraventricular hemorrhage. No ventriculomegaly. EEG consistent with moderately severe encephalopathy but no evidence of electrographic seizures. Metabolic panel unimpressive. WBC normal. VPA level sub-therapeutic 31.6   OBJECTIVE:                                                                                                                           Vital signs in last 24 hours: Temp:  [97.6 F (36.4 C)-99.7 F (37.6 C)] 97.6 F (36.4 C) (03/28 1133) Pulse Rate:  [44-100] 60 (03/28 1118) Resp:  [12-25] 19 (03/28 1118) BP: (101-179)/(39-154) 138/71 mmHg (03/28 1118) SpO2:  [20 %-100 %] 99 % (03/28 1118) FiO2 (%):  [40 %-100 %] 40 % (03/28 1119) Weight:  [134.5 kg (296 lb 8.3 oz)] 134.5 kg (296 lb 8.3 oz) (03/27 1230)  Intake/Output from previous day: 03/27 0701 - 03/28 0700 In: 2859.5 [I.V.:2092; IV Piggyback:767.5] Out: 865 [Urine:865] Intake/Output this shift: Total I/O In: 300 [I.V.:300] Out: 250 [Urine:250] Nutritional status: NPO  Past Medical History  Diagnosis Date  . PVD (peripheral vascular disease)   . Tobacco chew use     for 30 years    Neurologic Exam:  Mental status: on propofol, intubated on the vent. Open eyes and follows simple commands.  CN 2-12: pupils 2-3 mm bilaterally, reactive to light. No gaze preference. No nystagmus. Face seems to be symmetric. Tongue: intubated.  Motor: moves all limbs spontaneously and symmetrically.  Sensory: reacts to pain.  DTR's: 1 all over.   Plantars: upgoing.  Coordination and gait: unable to test.  No meningeal signs.  Lab Results: No results found for this basename: cbc, bmp, coags, chol, tri, ldl, hga1c   Lipid Panel No results found for this basename: CHOL, TRIG, HDL, CHOLHDL, VLDL, LDLCALC,  in the last 72 hours  Studies/Results: Ct Head Wo Contrast  01/02/2014   CLINICAL DATA:  Reason subarachnoid hemorrhage.  Now obtunded  EXAM: CT HEAD WITHOUT CONTRAST  TECHNIQUE: Contiguous axial images were obtained from the base of the skull through the vertex without intravenous contrast.  COMPARISON:  CT head 12/24/2013  FINDINGS: Resolving subarachnoid hemorrhage, over the convexity and in the interhemispheric fissure. Tentorial hemorrhage also shows interval improvement. Findings are resolving.  No new hemorrhage compared with the prior study. Ventricle size is normal. No acute infarct or mass.  IMPRESSION: Resolving subarachnoid hemorrhage. No superimposed acute abnormality compared with prior studies.   Electronically Signed   By: Marlan Palauharles  Clark M.D.   On: 01/02/2014 11:22   Mr Brain Wo Contrast  01/02/2014   CLINICAL DATA:  68 year old male with altered mental status. Obtunded. Recent subarachnoid hemorrhage status post fall. Initial encounter.  EXAM: MRI HEAD WITHOUT CONTRAST  TECHNIQUE: Multiplanar, multiecho pulse sequences of the brain and surrounding structures were obtained without intravenous contrast.  COMPARISON:  Head CT 1059 hr the same day. Brain MRI 12/27/2013 and earlier.  FINDINGS: Improved image quality today due to less motion artifact.  Persistent small volume of subarachnoid hemorrhage, most apparent along the left posterior convexity (series 7, image 19). T1 weighted images now demonstrate a small/trace volume of subdural hematoma along the tentorium (series 4, image 14). Trace layering right occipital horn intraventricular hemorrhage. No ventriculomegaly. Basilar cisterns are patent.  No restricted diffusion or  evidence of acute infarction. Major intracranial vascular flow voids are preserved.  The patient is intubated. Fluid in the pharynx. Mild left greater than right mastoid effusions persist.  No intracranial mass effect. No cerebral edema and overall normal for age gray and white matter signal. Negative pituitary and cervicomedullary junction. Negative visualized cervical spine. Visualized orbit soft tissues are within normal limits. Minor paranasal sinus mucosal thickening. Visualized scalp soft tissues are within normal limits. Visualized bone marrow signal is within normal limits.  IMPRESSION: 1. Persistent small volume of intracranial hemorrhage, both subarachnoid and subdural. Trace intraventricular hemorrhage. No ventriculomegaly. 2. No other acute intracranial abnormality. No acute infarct or cerebral edema identified. 3. Intubated, with mastoid effusions and mild sinus mucosal thickening.   Electronically Signed   By: Augusto GambleLee  Hall M.D.   On: 01/02/2014 21:06   Dg Chest Port 1 View  01/02/2014   CLINICAL DATA:  Central line placement.  EXAM: PORTABLE CHEST - 1 VIEW  COMPARISON:  DG CHEST 1V PORT dated 01/02/2014; DG CHEST 1V PORT dated 01/02/2014  FINDINGS: Left internal jugular central venous catheter with distal tip projecting in proximal superior vena cava. No pneumothorax. Endotracheal tube tip projects 4.4 cm above the carina.  Lung bases are not imaged, however there is persistent right lower lobe partially characterized patchy airspace opacity. Mild interstitial prominence. No pneumothorax. The cardiac silhouette appears at least moderately enlarged, mediastinal silhouette is nonsuspicious the  IMPRESSION: New left internal jugular central venous catheter with distal tip projecting in proximal superior vena cava. No pneumothorax. No apparent change in ET tube.  Lung bases incompletely imaged: Stable apparent cardiomegaly with right lower lobe airspace opacity.   Electronically Signed   By: Awilda Metroourtnay   Bloomer   On: 01/02/2014 18:34   Portable Chest Xray  01/02/2014   CLINICAL DATA:  Status post intubation  EXAM: PORTABLE CHEST - 1 VIEW  COMPARISON:  DG CHEST 1V PORT dated 01/02/2014  FINDINGS: The lung volumes are low. The endotracheal tube tip lies approximately 3.7 cm above the crotch of  the carina. There is some crowding of the pulmonary interstitial markings which is not new. There are stable coarse lung markings in the right infrahilar region. The cardiac silhouette is mildly enlarged. The pulmonary vascularity is not engorged.  IMPRESSION: There is bilateral pulmonary hypoinflation. The endotracheal tube appears to be in appropriate position. Persistently increased pulmonary interstitial markings bilaterally and confluent parenchymal consolidation on the right are demonstrated.   Electronically Signed   By: David  [place]   On: 01/02/2014 14:29   Dg Chest Port 1 View  01/02/2014   CLINICAL DATA:  Fever  EXAM: PORTABLE CHEST - 1 VIEW  COMPARISON:  01/01/2014  FINDINGS: Bibasilar atelectasis is unchanged. Negative for heart failure or effusion. Negative for mass lesion.  IMPRESSION: Bibasilar atelectasis, unchanged.   Electronically Signed   By: Marlan Palau M.D.   On: 01/02/2014 11:45    MEDICATIONS                                                                                                                       I have reviewed the patient's current medications.  ASSESSMENT/PLAN:                                                                                                           Encephalopathy. New fever but normal white count, mild hypercarbia. No seizures on EEG, MRI brain unchanged.  Will follow up.  Wyatt Portela, MD [facility] [phone]  01/03/2014, 11:54 AM

## 2014-01-03 NOTE — Progress Notes (Signed)
[facility] FOR AM LAB REPLACEMENT ONLY  The patient does apply for the [facility] Electrolyte Re[facility] Protocol based on the criteria listed below:   1. Is GFR >/= 40 ml/min? yes  Patient's GFR today is >90 2. Is urine output >/= 0.5 ml/kg/hr for the last 6 hours? yes Patient's UOP is 0.5 ml/kg/hr 3. Is BUN < 60 mg/dL? yes  Patient's BUN today is 19 4. Abnormal electrolyte(s):K+3.1 5. Ordered repletion with: protocol 6. If a panic level lab has been reported, has the CCM MD in charge been notified? yes.   Physician:  E Deterding  Cathlean CowerBradshaw, Plato Alspaugh [facility] 01/03/2014 6:15 AM

## 2014-01-03 NOTE — Progress Notes (Signed)
EEG Completed; Results Pending  

## 2014-01-03 NOTE — Progress Notes (Signed)
eLink Physician-Brief Progress Note Patient Name: Maurice BaizeWalter S Paul DOB: 03/06/1946 MRN: [id number]  Date of Service  01/03/2014   HPI/Events of Note   Break through agitation on int sedation  eICU Interventions  Fent gtt   Intervention Category Major Interventions: Delirium, psychosis, severe agitation - evaluation and management  Maurice Paul V. 01/03/2014, 4:02 PM

## 2014-01-03 NOTE — Progress Notes (Addendum)
NUTRITION FOLLOW UP  Intervention:   Utilize 79M PEPuP Protocol: initiate TF with Vital High Protein at 25 ml/h and Prostat 60 ml TID on day 1; on day 2, increase to goal rate of 50 ml/h (1200 ml per day) and continue Prostat 60 mL TID to provide 1800 kcal (23.8 kcal/kg IBW), 195 gm protein, 1003 ml free water daily. May require adjustment if pt continues on propofol.  Nutrition-related medication; consider bowel regimen for pt.   NUTRITION DIAGNOSIS:  Inadequate oral intake related to inability to eat as evidenced by NPO status   Goal:  Enteral nutrition to provide 60-70% of estimated calorie needs (22-25 kcals/kg ideal body weight) and 100% of estimated protein needs, based on ASPEN guidelines for permissive underfeeding in critically ill obese individuals   Monitor:  Vent status, initiation and tolerance of TF, weight trend  Assessment:   Pt admitted 3/18 with traumatic SAH and SDH. 3/19 mental and respiratory status decompensated and PCCM has been asked to assist with his management, extubated 3/19.  Re-admitted to ICU after worsening neuro-status, and hypercarbic respiratory failure 3/27.  Patient is currently intubated on ventilator support MV: 11.7 L/min Temp (24hrs), Avg:99.1 F (37.3 C), Min:97.6 F (36.4 C), Max:99.7 F (37.6 C)  Propofol: 16.1 ml/hr which provides 425 kcal/d. Discussed propofol use with RN who states that MD has ordered sedation meds for pt's agitation and that he will be weaning from propofol. Will continue with current TF recommendations in anticipation of wean from propofol.    Height: Ht Readings from Last 1 Encounters:  01/02/14 5\' 10"  (1.778 m)    Weight Status:   Wt Readings from Last 1 Encounters:  01/02/14 296 lb 8.3 oz (134.5 kg)    Re-estimated needs:  Kcal: 2327  Permissive underfeeding: [id number] Protein: >/= 188 grams  Fluid: > 2 L/day   Skin: generalized edema  Diet Order: NPO   Intake/Output Summary (Last 24 hours) at  01/03/14 1152 Last data filed at 01/03/14 1100  Gross per 24 hour  Intake 3159.5 ml  Output   1115 ml  Net 2044.5 ml    Last BM: unknown   Labs:   Recent Labs Lab 12/30/13 0315 12/31/13 0332 01/02/14 0601 01/03/14 0445  NA 146 147 146 147  K 3.7 3.8 4.2 3.1*  CL 103 102 100 103  CO2 33* 32 38* 33*  BUN 13 15 14 19   CREATININE 0.66 0.73 0.67 0.78  CALCIUM 8.4 8.5 8.6 8.4  MG 2.5  --   --   --   PHOS 2.3  --   --   --   GLUCOSE 113* 130* 142* 116*    CBG (last 3)   Recent Labs  01/02/14 2344 01/03/14 0423 01/03/14 0839  GLUCAP 93 108* 120*    Scheduled Meds: . antiseptic oral rinse  15 mL Mouth Rinse QID  . carvedilol  3.125 mg Oral BID WC  . chlorhexidine  15 mL Mouth Rinse BID  . famotidine (PEPCID) IV  20 mg Intravenous Q12H  . piperacillin-tazobactam (ZOSYN)  IV  3.375 g Intravenous Q8H  . potassium chloride  10 mEq Intravenous Q1 Hr x 4  . valproate sodium  500 mg Intravenous Q12H  . vancomycin  1,250 mg Intravenous Q12H    Continuous Infusions: . sodium chloride 50 mL/hr (01/03/14 0501)    Loyce DysKacie Coleen Cardiff, MS RD LDN Clinical Inpatient Dietitian Pager: [phone] Weekend/After hours pager: [phone]

## 2014-01-03 NOTE — Progress Notes (Signed)
PULMONARY / CRITICAL CARE MEDICINE   Name: Maurice Paul MRN: [id number] DOB: 10/09/1946    ADMISSION DATE:  12/24/2013 CONSULTATION DATE:  12/25/2013  REFERRING MD :  [name] PRIMARY SERVICE: PCCM  CHIEF COMPLAINT:  SAH  BRIEF PATIENT DESCRIPTION:  68 year old male with admitted 3/18 with traumatic SAH and SDH. 3/19 mental and respiratory status decompensated and PCCM has been asked to assist with his management.  >re-admitted to ICU after worsening neuro-status, and hypercarbic respiratory failure 3/27  SIGNIFICANT EVENTS / STUDIES:  3/18 CT head- Small SAH Left posterior parietal, Persistent SDH along falx and tentorium. 3/19 CT head- No change in amount of intracranial blood, no hydrocephalus.  3/21 MRI >>subarachnoid blood over the vertex and within the interhemispheric fissure 3/23: failed bedside swallow eval/ PCCM signed off  3/24-3/25 continuous EEG: finding consistent with a moderate diffuse or multifocal encephalopathy as can be seen secondary to medication effect, infection or other diffuse metabolic abnormalities. No seizure  3/25 mental status had improved 3/25 MRI: depakote reduced, neurology signed off.  3/26: increased agitation. Pulled out CVL  3/27 PCCM asked to see for hypercarbic resp failure and worsening encephalopathy  3/27 CT head: resolving SAH  3/27 MRI small vol ICH , tr intravent hem, no acute infarct .   LINES / TUBES: ETT 3/19 >> 3/20; 3/27>>> LIJ 3/19 >>3/23>>>  CULTURES: Sputum 3/27>>> BC x 2 3/27 >> UC 3/27 >>  ANTIBIOTICS: vanc 3/27>>> Zosyn 3/27>>>  SUBJECTIVE:  Agitated overnight , propofol added   VITAL SIGNS: Temp:  [99.3 F (37.4 C)-100.5 F (38.1 C)] 99.6 F (37.6 C) (03/28 0427) Pulse Rate:  [48-71] 57 (03/28 0742) Resp:  [12-25] 16 (03/28 0742) BP: (101-182)/(39-109) 145/62 mmHg (03/28 0742) SpO2:  [86 %-100 %] 95 % (03/28 0742) FiO2 (%):  [40 %-100 %] 60 % (03/28 0742) Weight:  [134.5 kg (296 lb 8.3 oz)] 134.5 kg (296  lb 8.3 oz) (03/27 1230)  HEMODYNAMICS:    VENTILATOR SETTINGS: Vent Mode:  [-] PRVC FiO2 (%):  [40 %-100 %] 60 % Set Rate:  [16 bmp-20 bmp] 16 bmp Vt Set:  [600 mL] 600 mL PEEP:  [5 cmH20] 5 cmH20 Plateau Pressure:  [19 cmH20-24 cmH20] 19 cmH20  INTAKE / OUTPUT: Intake/Output     03/27 0701 - 03/28 0700 03/28 0701 - 03/29 0700   P.O.     I.V. (mL/kg) 1992 (14.8)    IV Piggyback 767.5    Total Intake(mL/kg) 2759.5 (20.5)    Urine (mL/kg/hr) 865 (0.3)    Total Output 865     Net +1894.5          Urine Occurrence 1 x     PHYSICAL EXAMINATION: General:  Chronically ill appearing, arousable. Neuro:  Sedated on vent  HEENT:  Orally intubated  Cardiovascular:  RRR, Nl S1/S2, -M/R/G. Lungs: scattered rhonchi  Abdomen:  Obese, soft non-distended Musculoskeletal:  No acute deformity Skin:  Intact.  LABS:  CBC  Recent Labs Lab 12/31/13 0332 01/02/14 0601 01/03/14 0445  WBC 10.4 9.1 7.3  HGB 15.4 15.3 14.6  HCT 46.7 47.0 44.7  PLT 202 199 141*   Coag's No results found for this basename: APTT, INR,  in the last 168 hours BMET  Recent Labs Lab 12/31/13 0332 01/02/14 0601 01/03/14 0445  NA 147 146 147  K 3.8 4.2 3.1*  CL 102 100 103  CO2 32 38* 33*  BUN 15 14 19   CREATININE 0.73 0.67 0.78  GLUCOSE 130* 142* 116*  Electrolytes  Recent Labs Lab 12/30/13 0315 12/31/13 0332 01/02/14 0601 01/03/14 0445  CALCIUM 8.4 8.5 8.6 8.4  MG 2.5  --   --   --   PHOS 2.3  --   --   --    Sepsis Markers  Recent Labs Lab 01/02/14 1550 01/03/14 0445  PROCALCITON <0.10 <0.10   ABG  Recent Labs Lab 01/02/14 0846 01/02/14 1042 01/03/14 0406  PHART 7.337* 7.323* 7.583*  PCO2ART 76.9* 80.1* 37.2  PO2ART 73.4* 98.9 61.0*   Liver Enzymes  Recent Labs Lab 12/31/13 0332  AST 31  ALT 21  ALKPHOS 62  BILITOT 1.0  ALBUMIN 3.0*   Cardiac Enzymes No results found for this basename: TROPONINI, PROBNP,  in the last 168 hours Glucose  Recent Labs Lab  01/02/14 0649 01/02/14 0830 01/02/14 1548 01/02/14 1853 01/02/14 2344 01/03/14 0423  GLUCAP 130* 137* 109* 94 93 108*    Imaging Ct Head Wo Contrast  01/02/2014   CLINICAL DATA:  Reason subarachnoid hemorrhage.  Now obtunded  EXAM: CT HEAD WITHOUT CONTRAST  TECHNIQUE: Contiguous axial images were obtained from the base of the skull through the vertex without intravenous contrast.  COMPARISON:  CT head 12/24/2013  FINDINGS: Resolving subarachnoid hemorrhage, over the convexity and in the interhemispheric fissure. Tentorial hemorrhage also shows interval improvement. Findings are resolving.  No new hemorrhage compared with the prior study. Ventricle size is normal. No acute infarct or mass.  IMPRESSION: Resolving subarachnoid hemorrhage. No superimposed acute abnormality compared with prior studies.   Electronically Signed   By: Marlan Palau M.D.   On: 01/02/2014 11:22   Mr Brain Wo Contrast  01/02/2014   CLINICAL DATA:  68 year old male with altered mental status. Obtunded. Recent subarachnoid hemorrhage status post fall. Initial encounter.  EXAM: MRI HEAD WITHOUT CONTRAST  TECHNIQUE: Multiplanar, multiecho pulse sequences of the brain and surrounding structures were obtained without intravenous contrast.  COMPARISON:  Head CT 1059 hr the same day. Brain MRI 12/27/2013 and earlier.  FINDINGS: Improved image quality today due to less motion artifact.  Persistent small volume of subarachnoid hemorrhage, most apparent along the left posterior convexity (series 7, image 19). T1 weighted images now demonstrate a small/trace volume of subdural hematoma along the tentorium (series 4, image 14). Trace layering right occipital horn intraventricular hemorrhage. No ventriculomegaly. Basilar cisterns are patent.  No restricted diffusion or evidence of acute infarction. Major intracranial vascular flow voids are preserved.  The patient is intubated. Fluid in the pharynx. Mild left greater than right mastoid  effusions persist.  No intracranial mass effect. No cerebral edema and overall normal for age gray and white matter signal. Negative pituitary and cervicomedullary junction. Negative visualized cervical spine. Visualized orbit soft tissues are within normal limits. Minor paranasal sinus mucosal thickening. Visualized scalp soft tissues are within normal limits. Visualized bone marrow signal is within normal limits.  IMPRESSION: 1. Persistent small volume of intracranial hemorrhage, both subarachnoid and subdural. Trace intraventricular hemorrhage. No ventriculomegaly. 2. No other acute intracranial abnormality. No acute infarct or cerebral edema identified. 3. Intubated, with mastoid effusions and mild sinus mucosal thickening.   Electronically Signed   By: Augusto Gamble M.D.   On: 01/02/2014 21:06   Dg Chest Port 1 View  01/02/2014   CLINICAL DATA:  Central line placement.  EXAM: PORTABLE CHEST - 1 VIEW  COMPARISON:  DG CHEST 1V PORT dated 01/02/2014; DG CHEST 1V PORT dated 01/02/2014  FINDINGS: Left internal jugular central venous catheter with distal tip  projecting in proximal superior vena cava. No pneumothorax. Endotracheal tube tip projects 4.4 cm above the carina.  Lung bases are not imaged, however there is persistent right lower lobe partially characterized patchy airspace opacity. Mild interstitial prominence. No pneumothorax. The cardiac silhouette appears at least moderately enlarged, mediastinal silhouette is nonsuspicious the  IMPRESSION: New left internal jugular central venous catheter with distal tip projecting in proximal superior vena cava. No pneumothorax. No apparent change in ET tube.  Lung bases incompletely imaged: Stable apparent cardiomegaly with right lower lobe airspace opacity.   Electronically Signed   By: Awilda Metroourtnay  Bloomer   On: 01/02/2014 18:34   Portable Chest Xray  01/02/2014   CLINICAL DATA:  Status post intubation  EXAM: PORTABLE CHEST - 1 VIEW  COMPARISON:  DG CHEST 1V PORT dated  01/02/2014  FINDINGS: The lung volumes are low. The endotracheal tube tip lies approximately 3.7 cm above the crotch of the carina. There is some crowding of the pulmonary interstitial markings which is not new. There are stable coarse lung markings in the right infrahilar region. The cardiac silhouette is mildly enlarged. The pulmonary vascularity is not engorged.  IMPRESSION: There is bilateral pulmonary hypoinflation. The endotracheal tube appears to be in appropriate position. Persistently increased pulmonary interstitial markings bilaterally and confluent parenchymal consolidation on the right are demonstrated.   Electronically Signed   By: David  [place]az[place]   On: 01/02/2014 14:29   Dg Chest Port 1 View  01/02/2014   CLINICAL DATA:  Fever  EXAM: PORTABLE CHEST - 1 VIEW  COMPARISON:  01/01/2014  FINDINGS: Bibasilar atelectasis is unchanged. Negative for heart failure or effusion. Negative for mass lesion.  IMPRESSION: Bibasilar atelectasis, unchanged.   Electronically Signed   By: Marlan Palauharles  Clark M.D.   On: 01/02/2014 11:45   CXR: Bibasilar infiltrates right > left but actually improved 3/27  ASSESSMENT / PLAN:  PULMONARY A: Acute mild hypercarbic respiratory failure: etiology not clear. Meds? Sub-clinical seizure?  Possible aspiration pneumonia  P:   Full vent support  Daily SBT /WUA  PAD protocol See ID section   check cxr in am   CARDIOVASCULAR A:  HTN Hypovolemia? Bradycardia -watch diprivan closely and consider holding coreg if persists   P:  - Keep SBP < 140 mm/Hg. - PRN hydralazine as ordered. - Low dose oral Coreg added. -SCD   RENAL A:  R/o hypovolemia Hypokalemia   P:  Trend chemistry  Supportive IVFs Allow pos balance repleted K+   GASTROINTESTINAL A:  No acute issues P:   - PPI for Gi ppx - place OGT, start early TF   HEMATOLOGIC A:  No acute issues P:  - Monitor CBC, coags.  INFECTIOUS A:   New fever. Possible aspiration  PCT low  P:   Sputum  culture  Empiric HCAP coverage   ENDOCRINE A:   Hyperglycemia  P:   - SSI.  NEUROLOGIC A:   Traumatic SAH  And SDH -  Both improving by CT scan 3/27 Acute Encephalopathy , EEG neg for seizure. MRI w/ no new acute findings  >hypercarbia likely a contributing factor but not explain mental status changes P:   Supportive care  d/c symmetrel and risperadol 3/27      Tammy Parrett NP-C  [facility]  [phone]   Attending:  I have seen and examined the patient with nurse practitioner/resident and agree with the note above.   He seems to be improving from a neurologic standpoint, following some commands  today  It still remains unclear what caused yesterday's episode, MRI and EEG   SPV as long as tolerated today, likely extubation in AM if neuro exam remains stable  Use prn fentanyl today for sedation  Sinus bradycardia seems sedation related  Yolonda Kida PCCM Pager: [phone] Cell: [phone] If no response, call [phone]

## 2014-01-04 ENCOUNTER — Inpatient Hospital Stay (HOSPITAL_COMMUNITY): Payer: Medicare Other

## 2014-01-04 LAB — GLUCOSE, CAPILLARY
GLUCOSE-CAPILLARY: 112 mg/dL — AB (ref 70–99)
Glucose-Capillary: 113 mg/dL — ABNORMAL HIGH (ref 70–99)
Glucose-Capillary: 117 mg/dL — ABNORMAL HIGH (ref 70–99)
Glucose-Capillary: 120 mg/dL — ABNORMAL HIGH (ref 70–99)
Glucose-Capillary: 126 mg/dL — ABNORMAL HIGH (ref 70–99)
Glucose-Capillary: 142 mg/dL — ABNORMAL HIGH (ref 70–99)

## 2014-01-04 LAB — MAGNESIUM
Magnesium: 2.3 mg/dL (ref 1.5–2.5)
Magnesium: 2.2 mg/dL (ref 1.5–2.5)

## 2014-01-04 LAB — URINE CULTURE: Colony Count: >=100000

## 2014-01-04 LAB — CBC
HCT: 42.3 % (ref 39.0–52.0)
Hemoglobin: 13.6 g/dL (ref 13.0–17.0)
MCH: 33.3 pg (ref 26.0–34.0)
MCHC: 32.2 g/dL (ref 30.0–36.0)
MCV: 103.4 fL — AB (ref 78.0–100.0)
Platelets: 142 10*3/uL — ABNORMAL LOW (ref 150–400)
RBC: 4.09 MIL/uL — ABNORMAL LOW (ref 4.22–5.81)
RDW: 14.2 % (ref 11.5–15.5)
WBC: 9.3 10*3/uL (ref 4.0–10.5)

## 2014-01-04 LAB — POCT I-STAT 3, ART BLOOD GAS (G3+)
Acid-Base Excess: 9 mmol/L — ABNORMAL HIGH (ref 0.0–2.0)
Bicarbonate: 35.7 mEq/L — ABNORMAL HIGH (ref 20.0–24.0)
O2 Saturation: 99 %
PCO2 ART: 60.6 mmHg — AB (ref 35.0–45.0)
PO2 ART: 128 mmHg — AB (ref 80.0–100.0)
Patient temperature: 98.6
TCO2: 38 mmol/L (ref 0–100)
pH, Arterial: 7.379 (ref 7.350–7.450)

## 2014-01-04 LAB — BASIC METABOLIC PANEL
BUN: 17 mg/dL (ref 6–23)
CALCIUM: 7.9 mg/dL — AB (ref 8.4–10.5)
CO2: 31 meq/L (ref 19–32)
Chloride: 105 mEq/L (ref 96–112)
Creatinine, Ser: 0.73 mg/dL (ref 0.50–1.35)
GFR calc Af Amer: >90 mL/min (ref >90)
GFR calc non Af Amer: >90 mL/min (ref >90)
GLUCOSE: 110 mg/dL — AB (ref 70–99)
Potassium: 3.2 mEq/L — ABNORMAL LOW (ref 3.7–5.3)
Sodium: 146 mEq/L (ref 137–147)

## 2014-01-04 LAB — PHOSPHORUS
PHOSPHORUS: 2.6 mg/dL (ref 2.3–4.6)
Phosphorus: 3.3 mg/dL (ref 2.3–4.6)

## 2014-01-04 LAB — PROCALCITONIN: Procalcitonin: <0.1 ng/mL

## 2014-01-04 MED ORDER — FUROSEMIDE 10 MG/ML IJ SOLN
40.0000 mg | Freq: Once | INTRAMUSCULAR | Status: AC
  Administered 2014-01-04: 40 mg via INTRAVENOUS
  Filled 2014-01-04: qty 4

## 2014-01-04 MED ORDER — POTASSIUM CHLORIDE 20 MEQ/15ML (10%) PO LIQD
40.0000 meq | Freq: Once | ORAL | Status: AC
  Administered 2014-01-04: 40 meq
  Filled 2014-01-04: qty 30

## 2014-01-04 NOTE — Procedures (Signed)
Extubation Procedure Note  Patient Details:   Name: Maurice BaizeWalter S Traister DOB: 10/22/1945 MRN: [id number]   Airway Documentation:     Evaluation  O2 sats: stable throughout Complications: No apparent complications Patient did tolerate procedure well. Bilateral Breath Sounds: Clear;Diminished;Rhonchi Suctioning: Oral;Airway Yes  Pt tolerated wean, positive for cuff leak, extubated to 5lpm [place]. No dyspnea or stridor noted after extubation. Pt very sleepy at this time, but is starting to follow more commands. All vitals are within normal limits at this time. RT will continue to monitor.   Armando GangMike, Mickle Campton C 01/04/2014, 12:16 PM

## 2014-01-04 NOTE — Progress Notes (Signed)
ELINK ADULT ICU REPLACEMENT PROTOCOL FOR AM LAB REPLACEMENT ONLY  [facility] not apply for the [facility]ly[facility] Re[facility]ment Protocol based on the criteria listed below:   1. Is GFR >/= 40 ml/min? yes  Patient's GFR today is >90 2. Is urine output >/= 0.5 ml/kg/hr for the last 6 hours? no Patient's UOP is   none recorded  ml/kg/hr 3. Is BUN < 60 mg/dL? yes  Patient's BUN today is 17 4. Abnormal electrolyte(s): K+3.2 5. Ordered repletion with: NA 6. If a panic level lab has been reported, has the CCM MD in charge been notified? yes.   Physician:  Robert BellowZubelevitskiy  Beverley Sherrard Hilliard 01/04/2014 6:04 AM

## 2014-01-04 NOTE — Progress Notes (Signed)
PULMONARY / CRITICAL CARE MEDICINE   Name: Maurice Paul MRN: [id number] DOB: 1946-03-24    ADMISSION DATE:  12/24/2013 CONSULTATION DATE:  12/25/2013  REFERRING MD :  [name] PRIMARY SERVICE: PCCM  CHIEF COMPLAINT:  SAH  BRIEF PATIENT DESCRIPTION:  68 year old male with admitted 3/18 with traumatic SAH and SDH. 3/19 mental and respiratory status decompensated and PCCM has been asked to assist with his management.  >re-admitted to ICU after worsening neuro-status, and hypercarbic respiratory failure 3/27  SIGNIFICANT EVENTS / STUDIES:  3/18 CT head- Small SAH Left posterior parietal, Persistent SDH along falx and tentorium. 3/19 CT head- No change in amount of intracranial blood, no hydrocephalus.  3/21 MRI >>subarachnoid blood over the vertex and within the interhemispheric fissure 3/23: failed bedside swallow eval/ PCCM signed off  3/24-3/25 continuous EEG: finding consistent with a moderate diffuse or multifocal encephalopathy as can be seen secondary to medication effect, infection or other diffuse metabolic abnormalities. No seizure  3/25 mental status had improved 3/25 MRI: depakote reduced, neurology signed off.  3/26: increased agitation. Pulled out CVL  3/27 PCCM asked to see for hypercarbic resp failure and worsening encephalopathy  3/27 CT head: resolving SAH  3/27 MRI small vol ICH , tr intravent hem, no acute infarct .   LINES / TUBES: ETT 3/19 >> 3/20; 3/27>>> LIJ 3/19 >>3/23>>>  CULTURES: Sputum 3/27>>> BC x 2 3/27 >> UC 3/27 >>  ANTIBIOTICS: vanc 3/27>>> Zosyn 3/27>>>  SUBJECTIVE:  Weaning on 5/5 this am, good vol and sats  Followed simple commands only -gets agitated easily     VITAL SIGNS: Temp:  [97.6 F (36.4 C)-99.3 F (37.4 C)] 98 F (36.7 C) (03/29 0831) Pulse Rate:  [44-89] 53 (03/29 0900) Resp:  [11-22] 11 (03/29 0900) BP: (115-179)/(43-154) 146/49 mmHg (03/29 0900) SpO2:  [91 %-100 %] 96 % (03/29 0900) FiO2 (%):  [40 %-60 %] 50 %  (03/29 0755) Weight:  [131.3 kg (289 lb 7.4 oz)] 131.3 kg (289 lb 7.4 oz) (03/29 0430)  HEMODYNAMICS:    VENTILATOR SETTINGS: Vent Mode:  [-] PSV FiO2 (%):  [40 %-60 %] 50 % Set Rate:  [12 bmp] 12 bmp Vt Set:  [550 mL] 550 mL PEEP:  [5 cmH20] 5 cmH20 Pressure Support:  [5 cmH20] 5 cmH20 Plateau Pressure:  [16 cmH20-18 cmH20] 18 cmH20  INTAKE / OUTPUT: Intake/Output     03/28 0701 - 03/29 0700 03/29 0701 - 03/30 0700   I.V. (mL/kg) 2022.3 (15.4) 27.8 (0.2)   IV Piggyback 655    Total Intake(mL/kg) 2677.3 (20.4) 27.8 (0.2)   Urine (mL/kg/hr) 875 (0.3) 100 (0.2)   Total Output 875 100   Net +1802.3 -72.2         PHYSICAL EXAMINATION: General:  Chronically ill appearing, more alert this am  Neuro: agitates easily , f/c simple only  HEENT:  Orally intubated  Cardiovascular:  RRR, Nl S1/S2, -M/R/G. Lungs: diminished BS , no wheezing  Abdomen:  Obese, soft non-distended, tol TF  Musculoskeletal:  No acute deformity Skin:  Intact.  LABS:  CBC  Recent Labs Lab 01/02/14 0601 01/03/14 0445 01/04/14 0436  WBC 9.1 7.3 9.3  HGB 15.3 14.6 13.6  HCT 47.0 44.7 42.3  PLT 199 141* 142*   Coag's No results found for this basename: APTT, INR,  in the last 168 hours BMET  Recent Labs Lab 01/02/14 0601 01/03/14 0445 01/04/14 0436  NA 146 147 146  K 4.2 3.1* 3.2*  CL 100 103  105  CO2 38* 33* 31  BUN 14 19 17   CREATININE 0.67 0.78 0.73  GLUCOSE 142* 116* 110*   Electrolytes  Recent Labs Lab 12/30/13 0315  01/02/14 0601 01/03/14 0445 01/03/14 1202 01/04/14 0436  CALCIUM 8.4  < > 8.6 8.4  --  7.9*  MG 2.5  --   --   --  2.3 2.3  PHOS 2.3  --   --   --  1.8* 2.6  < > = values in this interval not displayed. Sepsis Markers  Recent Labs Lab 01/02/14 1550 01/03/14 0445 01/04/14 0436  PROCALCITON <0.10 <0.10 <0.10   ABG  Recent Labs Lab 01/02/14 1042 01/03/14 0406 01/04/14 0403  PHART 7.323* 7.583* 7.379  PCO2ART 80.1* 37.2 60.6*  PO2ART 98.9 61.0*  128.0*   Liver Enzymes  Recent Labs Lab 12/31/13 0332  AST 31  ALT 21  ALKPHOS 62  BILITOT 1.0  ALBUMIN 3.0*   Cardiac Enzymes No results found for this basename: TROPONINI, PROBNP,  in the last 168 hours Glucose  Recent Labs Lab 01/03/14 0839 01/03/14 1132 01/03/14 1929 01/03/14 2330 01/04/14 0332 01/04/14 0724  GLUCAP 120* 110* 123* 117* 113* 112*    Imaging Ct Head Wo Contrast  01/02/2014   CLINICAL DATA:  Reason subarachnoid hemorrhage.  Now obtunded  EXAM: CT HEAD WITHOUT CONTRAST  TECHNIQUE: Contiguous axial images were obtained from the base of the skull through the vertex without intravenous contrast.  COMPARISON:  CT head 12/24/2013  FINDINGS: Resolving subarachnoid hemorrhage, over the convexity and in the interhemispheric fissure. Tentorial hemorrhage also shows interval improvement. Findings are resolving.  No new hemorrhage compared with the prior study. Ventricle size is normal. No acute infarct or mass.  IMPRESSION: Resolving subarachnoid hemorrhage. No superimposed acute abnormality compared with prior studies.   Electronically Signed   By: Marlan Palau M.D.   On: 01/02/2014 11:22   Mr Brain Wo Contrast  01/02/2014   CLINICAL DATA:  68 year old male with altered mental status. Obtunded. Recent subarachnoid hemorrhage status post fall. Initial encounter.  EXAM: MRI HEAD WITHOUT CONTRAST  TECHNIQUE: Multiplanar, multiecho pulse sequences of the brain and surrounding structures were obtained without intravenous contrast.  COMPARISON:  Head CT 1059 hr the same day. Brain MRI 12/27/2013 and earlier.  FINDINGS: Improved image quality today due to less motion artifact.  Persistent small volume of subarachnoid hemorrhage, most apparent along the left posterior convexity (series 7, image 19). T1 weighted images now demonstrate a small/trace volume of subdural hematoma along the tentorium (series 4, image 14). Trace layering right occipital horn intraventricular hemorrhage.  No ventriculomegaly. Basilar cisterns are patent.  No restricted diffusion or evidence of acute infarction. Major intracranial vascular flow voids are preserved.  The patient is intubated. Fluid in the pharynx. Mild left greater than right mastoid effusions persist.  No intracranial mass effect. No cerebral edema and overall normal for age gray and white matter signal. Negative pituitary and cervicomedullary junction. Negative visualized cervical spine. Visualized orbit soft tissues are within normal limits. Minor paranasal sinus mucosal thickening. Visualized scalp soft tissues are within normal limits. Visualized bone marrow signal is within normal limits.  IMPRESSION: 1. Persistent small volume of intracranial hemorrhage, both subarachnoid and subdural. Trace intraventricular hemorrhage. No ventriculomegaly. 2. No other acute intracranial abnormality. No acute infarct or cerebral edema identified. 3. Intubated, with mastoid effusions and mild sinus mucosal thickening.   Electronically Signed   By: Augusto Gamble M.D.   On: 01/02/2014 21:06   Dg  Chest Port 1 View  01/04/2014   CLINICAL DATA:  Ventilator.  EXAM: PORTABLE CHEST - 1 VIEW  COMPARISON:  01/02/2014  FINDINGS: Support devices are stable. Bibasilar airspace opacities, likely atelectasis. Probable small effusions bilaterally. Mild cardiomegaly and vascular congestion. No real change since prior study.  IMPRESSION: Bibasilar atelectasis and small effusions.  Cardiomegaly with vascular congestion.  No real change.   Electronically Signed   By: Charlett Nose M.D.   On: 01/04/2014 07:12   Dg Chest Port 1 View  01/02/2014   CLINICAL DATA:  Central line placement.  EXAM: PORTABLE CHEST - 1 VIEW  COMPARISON:  DG CHEST 1V PORT dated 01/02/2014; DG CHEST 1V PORT dated 01/02/2014  FINDINGS: Left internal jugular central venous catheter with distal tip projecting in proximal superior vena cava. No pneumothorax. Endotracheal tube tip projects 4.4 cm above the carina.   Lung bases are not imaged, however there is persistent right lower lobe partially characterized patchy airspace opacity. Mild interstitial prominence. No pneumothorax. The cardiac silhouette appears at least moderately enlarged, mediastinal silhouette is nonsuspicious the  IMPRESSION: New left internal jugular central venous catheter with distal tip projecting in proximal superior vena cava. No pneumothorax. No apparent change in ET tube.  Lung bases incompletely imaged: Stable apparent cardiomegaly with right lower lobe airspace opacity.   Electronically Signed   By: Awilda Metro   On: 01/02/2014 18:34   Portable Chest Xray  01/02/2014   CLINICAL DATA:  Status post intubation  EXAM: PORTABLE CHEST - 1 VIEW  COMPARISON:  DG CHEST 1V PORT dated 01/02/2014  FINDINGS: The lung volumes are low. The endotracheal tube tip lies approximately 3.7 cm above the crotch of the carina. There is some crowding of the pulmonary interstitial markings which is not new. There are stable coarse lung markings in the right infrahilar region. The cardiac silhouette is mildly enlarged. The pulmonary vascularity is not engorged.  IMPRESSION: There is bilateral pulmonary hypoinflation. The endotracheal tube appears to be in appropriate position. Persistently increased pulmonary interstitial markings bilaterally and confluent parenchymal consolidation on the right are demonstrated.   Electronically Signed   By: David  [place]   On: 01/02/2014 14:29   Dg Chest Port 1 View  01/02/2014   CLINICAL DATA:  Fever  EXAM: PORTABLE CHEST - 1 VIEW  COMPARISON:  01/01/2014  FINDINGS: Bibasilar atelectasis is unchanged. Negative for heart failure or effusion. Negative for mass lesion.  IMPRESSION: Bibasilar atelectasis, unchanged.   Electronically Signed   By: Marlan Palau M.D.   On: 01/02/2014 11:45   Dg Abd Portable 1v  01/03/2014   CLINICAL DATA:  Orogastric tube placement.  EXAM: PORTABLE ABDOMEN - 1 VIEW  COMPARISON:  None.  FINDINGS:  Normal bowel gas pattern. Orogastric tube tip in the distal stomach and side hole in the proximal stomach. The right lateral abdomen is not included. Lower thoracic spine degenerative changes.  IMPRESSION: Orogastric tube tip in the distal stomach.   Electronically Signed   By: Gordan Payment M.D.   On: 01/03/2014 21:34   CXR: Bibasilar atelectasis and small effusions. Cardiomegaly with vascular congestion.   ASSESSMENT / PLAN:  PULMONARY A: Acute mild hypercarbic respiratory failure: etiology not clear. Meds? Sub-clinical seizure?  Possible aspiration pneumonia  3/29 >weaning 5/5 , good vol, and sats, mentation is main barrier to extubation  P:   Wean vent as tolerated -consider extubation  Daily SBT /WUA  PAD protocol See ID section   check cxr in am  CARDIOVASCULAR A:  HTN Hypovolemia? Bradycardia -watch diprivan closely and consider holding coreg if persists   P:  - Keep SBP < 140 mm/Hg. - PRN hydralazine as ordered. - Low dose oral Coreg added. -SCD   RENAL A:  Hypokalemia  3/29 : + 3.5 L bal  P:  Trend chemistry  KVO IVF   repleted K+  Lasix 40mg  IV x 1   GASTROINTESTINAL A:  No acute issues P:   - PPI for Gi ppx - cont  TF   HEMATOLOGIC A:  No acute issues P:  - Monitor CBC,  INFECTIOUS A:   New fever. Possible aspiration  PCT low  P:   Empiric HCAP coverage > d/c vanc, consider d/c zosyn this week  Follow cx data   ENDOCRINE A:   Hyperglycemia  P:   - SSI.  NEUROLOGIC A:   Traumatic SAH  And SDH -  Both improving by CT scan 3/27 Acute Encephalopathy , EEG neg for seizure. MRI w/ no new acute findings  >hypercarbia likely a contributing factor  P:   Supportive care  d/c symmetrel and risperadol 3/27      Tammy Parrett NP-C  [facility]  [phone]     Attending:  I have seen and examined the patient with nurse practitioner/resident and agree with the note above.   Improved, no clear pneumonia, but some  pulmonary edema No clear inciting event for the decline on 3/27, Suspect he will continue to have resting hypercarbia and obesity hypoventilation and would benefit from nocturnal BIPAP. While this definitely contributed to the 3/27 decline, it is not clearly the only cause.  Will need outpatient sleep study.  Extubate today Lasix today  CC time 40 minutes.  Yolonda KidaMCQUAID, DOUGLAS [place] PCCM Pager: [phone] Cell: [phone] If no response, call [phone]

## 2014-01-04 NOTE — Progress Notes (Signed)
SLP Cancellation Note  Patient Details Name: Jewel BaizeWalter S Heitzenrater MRN: [id number] DOB: 08/05/1946   Cancelled treatment:  ST received order for BSE.  Evaluation deferred to 01/05/14. Moreen FowlerKaren Becki Mccaskill MS, CCC-SLP [phone] [facility] Hosp - AnaheimDANKOF,Ahava Kissoon 01/04/2014, 1:19 PM

## 2014-01-05 ENCOUNTER — Inpatient Hospital Stay (HOSPITAL_COMMUNITY): Payer: Medicare Other

## 2014-01-05 LAB — CBC
HEMATOCRIT: 43.8 % (ref 39.0–52.0)
HEMOGLOBIN: 14 g/dL (ref 13.0–17.0)
MCH: 33.3 pg (ref 26.0–34.0)
MCHC: 32 g/dL (ref 30.0–36.0)
MCV: 104.3 fL — AB (ref 78.0–100.0)
Platelets: 157 10*3/uL (ref 150–400)
RBC: 4.2 MIL/uL — AB (ref 4.22–5.81)
RDW: 13.7 % (ref 11.5–15.5)
WBC: 11.6 10*3/uL — ABNORMAL HIGH (ref 4.0–10.5)

## 2014-01-05 LAB — BASIC METABOLIC PANEL
BUN: 14 mg/dL (ref 6–23)
CALCIUM: 8.3 mg/dL — AB (ref 8.4–10.5)
CO2: 34 meq/L — AB (ref 19–32)
CREATININE: 0.63 mg/dL (ref 0.50–1.35)
Chloride: 105 mEq/L (ref 96–112)
GFR calc Af Amer: >90 mL/min (ref >90)
GLUCOSE: 119 mg/dL — AB (ref 70–99)
Potassium: 3.4 mEq/L — ABNORMAL LOW (ref 3.7–5.3)
Sodium: 149 mEq/L — ABNORMAL HIGH (ref 137–147)

## 2014-01-05 LAB — GLUCOSE, CAPILLARY
GLUCOSE-CAPILLARY: 114 mg/dL — AB (ref 70–99)
GLUCOSE-CAPILLARY: 95 mg/dL (ref 70–99)
GLUCOSE-CAPILLARY: 114 mg/dL — AB (ref 70–99)
Glucose-Capillary: 113 mg/dL — ABNORMAL HIGH (ref 70–99)
Glucose-Capillary: 101 mg/dL — ABNORMAL HIGH (ref 70–99)
Glucose-Capillary: 111 mg/dL — ABNORMAL HIGH (ref 70–99)

## 2014-01-05 LAB — MAGNESIUM: Magnesium: 2.3 mg/dL (ref 1.5–2.5)

## 2014-01-05 LAB — PHOSPHORUS: PHOSPHORUS: 3.9 mg/dL (ref 2.3–4.6)

## 2014-01-05 LAB — PRO B NATRIURETIC PEPTIDE: Pro B Natriuretic peptide (BNP): 620.3 pg/mL — ABNORMAL HIGH (ref 0–125)

## 2014-01-05 MED ORDER — BIOTENE DRY MOUTH MT LIQD
15.0000 mL | Freq: Two times a day (BID) | OROMUCOSAL | Status: DC
  Administered 2014-01-05 – 2014-01-08 (×5): 15 mL via OROMUCOSAL

## 2014-01-05 MED ORDER — KCL IN DEXTROSE-NACL 20-5-0.45 MEQ/L-%-% IV SOLN
INTRAVENOUS | Status: DC
  Administered 2014-01-05: 75 mL/h via INTRAVENOUS
  Administered 2014-01-06: 1000 mL via INTRAVENOUS
  Filled 2014-01-05 (×3): qty 1000

## 2014-01-05 MED ORDER — POTASSIUM CHLORIDE 10 MEQ/50ML IV SOLN
10.0000 meq | INTRAVENOUS | Status: AC
  Administered 2014-01-05 (×2): 10 meq via INTRAVENOUS
  Filled 2014-01-05 (×2): qty 50

## 2014-01-05 MED ORDER — HALOPERIDOL LACTATE 5 MG/ML IJ SOLN
5.0000 mg | Freq: Once | INTRAMUSCULAR | Status: AC
  Administered 2014-01-05: 5 mg via INTRAVENOUS

## 2014-01-05 MED ORDER — CHLORHEXIDINE GLUCONATE 0.12 % MT SOLN
15.0000 mL | Freq: Two times a day (BID) | OROMUCOSAL | Status: DC
  Administered 2014-01-05 – 2014-01-07 (×4): 15 mL via OROMUCOSAL
  Filled 2014-01-05 (×4): qty 15

## 2014-01-05 MED ORDER — HALOPERIDOL LACTATE 5 MG/ML IJ SOLN
1.0000 mg | INTRAMUSCULAR | Status: DC | PRN
  Administered 2014-01-05 – 2014-01-06 (×6): 4 mg via INTRAVENOUS
  Filled 2014-01-05 (×7): qty 1

## 2014-01-05 NOTE — Progress Notes (Signed)
Occupational Therapy Treatment Patient Details Name: Jewel BaizeWalter S Hickson MRN: [id number] DOB: 02/11/1946 Today's Date: 01/05/2014    History of present illness [facility],  pt intubated for 2 days.  possibly experiencing mulifactorial delirium in form of TBI due to respiratory status. 3/27 pt intubated and moved to 2M02 due to worsening neuro-status, and hypercarbic respiratory failure 01/04/14 extubated   OT comments  This 68 yo male admitted with above and since admitted had a medical decline on 01/02/14. Presents to acute OT without making progress this date due to restlessness/fidgety. Will continue to benefit from acute OT with follow up OT on CIR.  Follow Up Recommendations  CIR;Supervision/Assistance - 24 hour    Equipment Recommendations  3 in 1 bedside comode    Recommendations for Other Services Rehab consult    Precautions / Restrictions Precautions Precautions: Fall Restrictions Weight Bearing Restrictions: No       Mobility Bed Mobility Overal bed mobility: Needs Assistance;+2 for physical assistance;+ 2 for safety/equipment Bed Mobility: Rolling;Sidelying to Sit;Sit to Supine Rolling: Min guard (rolls right spontaneously without A, minguard is only due to safety) Sidelying to sit: +2 for physical assistance;+2 for safety/equipment;Max assist   Sit to supine: +2 for physical assistance;+2 for safety/equipment;Max assist      Transfers Overall transfer level: Needs assistance   Transfers: Sit to/from Stand Sit to Stand: +2 physical assistance;+2 safety/equipment;Total assist         General transfer comment: bilat knees blocked, unable to achieve full upright position this date. Pt with partial stand x2 and then one other attempt with buttocks barely clearing bed. Pt initiates the stand when asked and counting to 3, but does not follow through with completed stand    Balance Overall balance assessment: Needs  assistance Sitting-balance support: Bilateral upper extremity supported;Feet supported Sitting balance-Leahy Scale: Zero Sitting balance - Comments: Balance at EOB varied from min A to total A (with pt extending trunk at times)   Standing balance support:  (attempted sit<>stand x3 without full extensionl) Standing balance-Leahy Scale: Zero                     ADL                     General ADL Comments: Unable to address BADLS today due to pt very restless and fidgety, needed to constantly cue him to stop, hold on-don't move, sit still, or conversation to try to distract him from what he was trying to do.                 Cognition   Behavior During Therapy: Impulsive Overall Cognitive Status: Impaired/Different from baseline Area of Impairment: Orientation;Attention;Following commands;Safety/judgement;Problem solving;Awareness Orientation Level: Place Current Attention Level: Sustained (brief periods)    Following Commands: Follows one step commands inconsistently;Follows one step commands with increased time Safety/Judgement: Decreased awareness of safety;Decreased awareness of deficits Awareness: Intellectual Problem Solving: Requires verbal cues;Requires tactile cues;Difficulty sequencing;Decreased initiation;Slow processing                Pertinent Vitals/ Pain       Vital signs stable         Frequency Min 3X/week     Progress Toward Goals  OT Goals(current goals can now be found in the care plan section)  Progress towards OT goals: Not progressing toward goals - comment (to restless today)     Plan Discharge  plan remains appropriate    End of Session Equipment Utilized During Treatment: Gait belt  Activity Tolerance  (limited by restlessness)   Patient Left in bed;with restraints reapplied;with family/visitor present   Nurse Communication Mobility status        Time: 4098-1191 OT Time Calculation (min): 34 min  Charges: OT  General Charges $OT Visit: 1 Procedure OT Treatments $Therapeutic Activity: 8-22 mins  Evette Georges [phone] 01/05/2014, 1:54 PM

## 2014-01-05 NOTE — Progress Notes (Signed)
[facility] LAB RE[facility]ACEMENT ONLY  The patient does [facility] based on the criteria listed below:   1. Is GFR >/= 40 ml/min? yes  Patient's GFR today is >90 2. Is urine output >/= 0.5 ml/kg/hr for the last 6 hours? yes Patient's UOP is 0.5 ml/kg/hr 3. Is BUN < 60 mg/dL? yes  Patient's BUN today is 14 4. Abnormal electrolyte(s): K+3.4 5. Ordered repletion with: protocol 6. If a panic level lab has been reported, has the CCM MD in charge been notified? yes.   Physician:  Maurice LoaderRamaswamy  [name] Paul 01/05/2014 5:10 AM

## 2014-01-05 NOTE — Progress Notes (Signed)
Physical Therapy Treatment Patient Details Name: Maurice Paul MRN: [id number] DOB: May 31, 1946 Today's Date: 01/05/2014    History of Present Illness Pt sustained fall at home resulting in [facility] in Lt pariteal region,  pt intubated for 2 days.  possibly experiencing mulifactorial delirium in form of TBI due to respiratory status. 3/27 pt intubated and moved to 2M02 due to worsening neuro-status, and hypercarbic respiratory failure 01/04/14 extubated    PT Comments    Pt restless and gets agitated easily.  Pt follows some one step directions, though easily distracted and unable to stay on task.  Pt unable to achieve fully standing despite cues and facilitation.  Pt at this time presents as [place] IV, confused and agitated.  Will continue to follow.    Follow Up Recommendations  CIR     Equipment Recommendations   (TBD)    Recommendations for Other Services       Precautions / Restrictions Precautions Precautions: Fall Restrictions Weight Bearing Restrictions: No    Mobility  Bed Mobility Overal bed mobility: Needs Assistance;+2 for physical assistance;+ 2 for safety/equipment Bed Mobility: Rolling;Sidelying to Sit;Sit to Supine Rolling: Min guard (rolls right spontaneously without A, minguard is only due to safety) Sidelying to sit: +2 for physical assistance;+2 for safety/equipment;Max assist   Sit to supine: +2 for physical assistance;+2 for safety/equipment;Max assist   General bed mobility comments: pt restless and moving impulsively.    Transfers Overall transfer level: Needs assistance Equipment used: 2 person hand held assist Transfers: Sit to/from Stand Sit to Stand: +2 physical assistance;+2 safety/equipment;Total assist         General transfer comment: bilat knees blocked, unable to achieve full upright position this date. Pt with partial stand x2 and then one other attempt with buttocks barely clearing bed. Pt initiates the stand when asked and counting to 3,  but does not follow through with completed stand  Ambulation/Gait                 Stairs            Wheelchair Mobility    Modified Rankin (Stroke Patients Only)       Balance Overall balance assessment: Needs assistance Sitting-balance support: Bilateral upper extremity supported Sitting balance-Leahy Scale: Zero Sitting balance - Comments: Balance at EOB varied from min A to total A (with pt extending trunk at times)   Standing balance support:  (attempted sit<>stand x3 without full extensionl) Standing balance-Leahy Scale: Zero                      Cognition Arousal/Alertness: Awake/alert Behavior During Therapy: Impulsive Overall Cognitive Status: Impaired/Different from baseline Area of Impairment: Orientation;Attention;Following commands;Safety/judgement;Problem solving;Awareness Orientation Level: Place Current Attention Level: Sustained (brief periods)   Following Commands: Follows one step commands inconsistently;Follows one step commands with increased time Safety/Judgement: Decreased awareness of safety;Decreased awareness of deficits Awareness: Intellectual Problem Solving: Requires verbal cues;Requires tactile cues;Difficulty sequencing;Decreased initiation;Slow processing      Exercises      General Comments        Pertinent Vitals/Pain Indicated pain in sitting ?cathetar?       Home Living                      Prior Function            PT Goals (current goals can now be found in the care plan section) Acute Rehab PT Goals Time For Goal Achievement: 01/11/14 Potential  to Achieve Goals: Good Progress towards PT goals: Progressing toward goals    Frequency  Min 4X/week    PT Plan Current plan remains appropriate    End of Session Equipment Utilized During Treatment: Gait belt;Oxygen Activity Tolerance: Patient tolerated treatment well Patient left: in bed;with call bell/phone within reach;with  family/visitor present;with restraints reapplied     Time: 4782-95621301-1325 PT Time Calculation (min): 24 min  Charges:  $Therapeutic Activity: 8-22 mins                    G CodesSunny Schlein:      Maurice Paul, [place] CarolinaPT [phone] 01/05/2014, 2:41 PM

## 2014-01-05 NOTE — Progress Notes (Signed)
NEURO HOSPITALIST PROGRESS NOTE   SUBJECTIVE:                                                                                                                        Extubated. He answers simple questions and follows very basic commands inconsistently. Mild leukocytosis, mild hypernatremia.  OBJECTIVE:                                                                                                                           Vital signs in last 24 hours: Temp:  [97.5 F (36.4 C)-99.1 F (37.3 C)] 97.5 F (36.4 C) (03/30 0800) Pulse Rate:  [53-77] 62 (03/30 0000) Resp:  [11-20] 17 (03/30 0700) BP: (81-161)/(42-82) 131/63 mmHg (03/30 0700) SpO2:  [89 %-100 %] 100 % (03/30 0000) FiO2 (%):  [50 %] 50 % (03/29 1100)  Intake/Output from previous day: 03/29 0701 - 03/30 0700 In: 1087.5 [I.V.:837.8; IV Piggyback:249.7] Out: 2500 [Urine:2500] Intake/Output this shift:   Nutritional status: NPO  Past Medical History  Diagnosis Date  . PVD (peripheral vascular disease)   . Tobacco chew use     for 30 years    Neurologic Exam:  Mental status: Open eyes, knows his name, and follows simple commands.  CN 2-12: pupils 2-3 mm bilaterally, reactive to light. No gaze preference. No nystagmus. Face seems to be symmetric. Tongue: intubated.  Motor: moves all limbs spontaneously and symmetrically.  Sensory: reacts to pain.  DTR's: 1 all over.  Plantars: upgoing.  Coordination and gait: unable to test.  No meningeal signs   Lab Results: No results found for this basename: cbc, bmp, coags, chol, tri, ldl, hga1c   Lipid Panel No results found for this basename: CHOL, TRIG, HDL, CHOLHDL, VLDL, LDLCALC,  in the last 72 hours  Studies/Results: Dg Chest Port 1 View  01/04/2014   CLINICAL DATA:  Ventilator.  EXAM: PORTABLE CHEST - 1 VIEW  COMPARISON:  01/02/2014  FINDINGS: Support devices are stable. Bibasilar airspace opacities, likely atelectasis.  Probable small effusions bilaterally. Mild cardiomegaly and vascular congestion. No real change since prior study.  IMPRESSION: Bibasilar atelectasis and small effusions.  Cardiomegaly with vascular congestion.  No real change.   Electronically Signed   By: Caryn Bee  Dover M.D.   On: 01/04/2014 07:12   Dg Abd Portable 1v  01/03/2014   CLINICAL DATA:  Orogastric tube placement.  EXAM: PORTABLE ABDOMEN - 1 VIEW  COMPARISON:  None.  FINDINGS: Normal bowel gas pattern. Orogastric tube tip in the distal stomach and side hole in the proximal stomach. The right lateral abdomen is not included. Lower thoracic spine degenerative changes.  IMPRESSION: Orogastric tube tip in the distal stomach.   Electronically Signed   By: Gordan PaymentSteve  Reid M.D.   On: 01/03/2014 21:34    MEDICATIONS                                                                                                                       I have reviewed the patient's current medications.  ASSESSMENT/PLAN:                                                                                                           Agitated delirium/encephalopathy, improving. Will continue to follow.  Maurice Portelasvaldo Camilo, MD [facility] [phone]  01/05/2014, 8:16 AM

## 2014-01-05 NOTE — Progress Notes (Signed)
50 mcg of Fentanyl wasted in sink and witnessed by Janice Coffinyesha Harvey RN

## 2014-01-05 NOTE — Progress Notes (Signed)
PULMONARY / CRITICAL CARE MEDICINE   Name: Maurice BaizeWalter S Moncur MRN: [id number] DOB: 05/07/1946    ADMISSION DATE:  12/24/2013 CONSULTATION DATE:  12/25/2013  REFERRING MD :  Rito EhrlichKrishnan PRIMARY SERVICE: PCCM  CHIEF COMPLAINT:  SAH  BRIEF PATIENT DESCRIPTION:  [age] year old male with admitted 3/18 with traumatic SAH and SDH. 3/19 mental and respiratory status decompensated and PCCM has been asked to assist with his management. Transferred back to ICU 3/27 after worsening neuro-status, and hypercarbic respiratory failure requiring intubation  SIGNIFICANT EVENTS / STUDIES:  3/18 CT head- Small SAH Left posterior parietal, Persistent SDH along falx and tentorium. 3/19 CT head- No change in amount of intracranial blood, no hydrocephalus.  3/21 MRI >>subarachnoid blood over the vertex and within the interhemispheric fissure 3/23: failed bedside swallow eval/ PCCM signed off  3/24-3/25 continuous EEG: finding consistent with a moderate diffuse or multifocal encephalopathy as can be seen secondary to medication effect, infection or other diffuse metabolic abnormalities. No seizure  3/25 mental status had improved 3/25 MRI: depakote reduced, neurology signed off.  3/26: increased agitation. Pulled out CVL  3/27 PCCM asked to see for hypercarbic resp failure and worsening encephalopathy  3[facility]  3/27 MRI small vol ICH , tr intravent hem, no acute infarct  3/29 Extubated successfully 3/30 Agitated delirium persists  LINES / TUBES: ETT 3/19 >> 3/20; 3/27>> 3/29 LIJ 3/19 >>3/23 >>   CULTURES: Sputum 3/27>> NOF BC x 2 3/27 >>  UC 3/27 >> Klebsiella (pansens)  ANTIBIOTICS: vanc 3/27>> 3/30 Zosyn 3/27>> 3/30  SUBJECTIVE:  Very agitated at times   VITAL SIGNS: Temp:  [97.5 F (36.4 C)-99.1 F (37.3 C)] 97.7 F (36.5 C) (03/30 1100) Pulse Rate:  [34-77] 58 (03/30 1300) Resp:  [13-20] 17 (03/30 1300) BP: (92-161)/(42-82) 149/72 mmHg (03/30 1300) SpO2:  [91 %-100 %] 97 %  (03/30 1300)  HEMODYNAMICS:    VENTILATOR SETTINGS:    INTAKE / OUTPUT: Intake/Output     03/29 0701 - 03/30 0700 03/30 0701 - 03/31 0700   I.V. (mL/kg) 837.8 (6.4) 120 (0.9)   IV Piggyback 249.7 150   Total Intake(mL/kg) 1087.5 (8.3) 270 (2.1)   Urine (mL/kg/hr) 2500 (0.8) 450 (0.4)   Total Output 2500 450   Net -1412.5 -180         PHYSICAL EXAMINATION: General: RASS +1. MAEs. F/C intermttently, poorly oriented Neuro: MAEs HEENT: WNL Cardiovascular: RRR s M Lungs: Clear anteriorly Abdomen:  Obese, soft non-distended Ext: warm, no edema  LABS: I have reviewed all of today's lab results. Relevant abnormalities are discussed in the A/P section  CXR: NNF ASSESSMENT / PLAN:  PULMONARY A: Acute respiratory failure, resolved  P:   Supplemental O2 as needed Monitor resp status closely in ICU   CARDIOVASCULAR A:  HTN, controlled Bradycardia -   P:  Monitor rhythm and BP  Holding carvedilol  RENAL A:  Hypokalemia, resolved P:  Monitor BMET intermittently Monitor I/Os Correct electrolytes as indicated  GASTROINTESTINAL A:  Dysphagia post CVA P:   SLP eval  Decide on PO diet vs replacement of NGT 3/31  HEMATOLOGIC A:  No acute issues P:  - Monitor CBC,  INFECTIOUS A:   Klebsiella UTI, treated P:   Micro and abx as above  ENDOCRINE A:   Hyperglycemia, resolved P:   - SSI.  NEUROLOGIC A:   Traumatic SAH  And SDH -  Both improving by CT scan 3/27 Acute Encephalopathy, agitation P:   Minimize BZDs  PRN Haldol Monitor in ICU Neuro following    Billy Fischer, MD ; [facility] service Mobile [phone].  After 5:30 PM or weekends, call [phone]

## 2014-01-05 NOTE — Progress Notes (Signed)
NUTRITION FOLLOW UP  Intervention:   1.  Modify diet; resume PO diet once medically appropriate per MD discretion. 2.  Supplements; consider supplements based on adequacy of intake once appropriate.   Nutrition Dx:   Inadequate oral intake, ongoing  Goal:  Enteral nutrition to provide 60-70% of estimated calorie needs (22-25 kcals/kg ideal body weight) and 100% of estimated protein needs, based on ASPEN guidelines for permissive underfeeding in critically ill obese individuals   Monitor:  Vent status, initiation and tolerance of TF, weight trend   Assessment:  Pt admitted 3/18 with traumatic SAH and SDH. 3/19 mental and respiratory status decompensated and PCCM has been asked to assist with his management, extubated 3/19.  Re-admitted to ICU after worsening neuro-status, and hypercarbic respiratory failure 3/27. Pt required intubation, however has since been extubated.  Pt currently with delirium.  Remains NPO.  RD to follow for ongoing interventions based on improvement in MS and pt able to meet estimated nutrition needs.   Height: Ht Readings from Last 1 Encounters:  01/02/14 5\' 10"  (1.778 m)    Weight Status:   Wt Readings from Last 1 Encounters:  01/04/14 289 lb 7.4 oz (131.3 kg)    Re-estimated needs:  Kcal: 2250-2600 Protein: 105-130g Fluid: >2.2 L/day  Skin: generalized edema  Diet Order: NPO   Intake/Output Summary (Last 24 hours) at 01/05/14 1440 Last data filed at 01/05/14 1300  Gross per 24 hour  Intake    720 ml  Output   2700 ml  Net  -1980 ml    Last BM: unknown  Labs:   Recent Labs Lab 01/03/14 0445  01/04/14 0436 01/04/14 1202 01/05/14 0300  NA 147  --  146  --  149*  K 3.1*  --  3.2*  --  3.4*  CL 103  --  105  --  105  CO2 33*  --  31  --  34*  BUN 19  --  17  --  14  CREATININE 0.78  --  0.73  --  0.63  CALCIUM 8.4  --  7.9*  --  8.3*  MG  --   < > 2.3 2.2 2.3  PHOS  --   < > 2.6 3.3 3.9  GLUCOSE 116*  --  110*  --  119*  < > =  values in this interval not displayed.  CBG (last 3)   Recent Labs  01/05/14 0339 01/05/14 0725 01/05/14 1117  GLUCAP 114* 95 101*    Scheduled Meds: . famotidine (PEPCID) IV  20 mg Intravenous Q12H    Continuous Infusions: . sodium chloride 20 mL/hr at 01/04/14 1051  . dextrose 5 % and 0.45 % NaCl with KCl 20 mEq/L 75 mL/hr (01/05/14 1400)    Loyce DysKacie Maridee Slape, MS RD LDN Clinical Inpatient Dietitian Pager: [phone] Weekend/After hours pager: [phone]

## 2014-01-05 NOTE — Progress Notes (Signed)
Noted extubation . I will follow his progress with therapy to assist with disposition as appropriate. [phone]

## 2014-01-06 DIAGNOSIS — I6200 Nontraumatic subdural hemorrhage, unspecified: Secondary | ICD-10-CM

## 2014-01-06 DIAGNOSIS — I10 Essential (primary) hypertension: Secondary | ICD-10-CM

## 2014-01-06 DIAGNOSIS — S069XAA Unspecified intracranial injury with loss of consciousness status unknown, initial encounter: Secondary | ICD-10-CM

## 2014-01-06 DIAGNOSIS — G9341 Metabolic encephalopathy: Secondary | ICD-10-CM

## 2014-01-06 DIAGNOSIS — S069X9A Unspecified intracranial injury with loss of consciousness of unspecified duration, initial encounter: Secondary | ICD-10-CM

## 2014-01-06 LAB — GLUCOSE, CAPILLARY
GLUCOSE-CAPILLARY: 130 mg/dL — AB (ref 70–99)
GLUCOSE-CAPILLARY: 128 mg/dL — AB (ref 70–99)
GLUCOSE-CAPILLARY: 122 mg/dL — AB (ref 70–99)
Glucose-Capillary: 122 mg/dL — ABNORMAL HIGH (ref 70–99)
Glucose-Capillary: 125 mg/dL — ABNORMAL HIGH (ref 70–99)
Glucose-Capillary: 151 mg/dL — ABNORMAL HIGH (ref 70–99)

## 2014-01-06 LAB — BASIC METABOLIC PANEL
BUN: 10 mg/dL (ref 6–23)
CALCIUM: 8.2 mg/dL — AB (ref 8.4–10.5)
CO2: 32 mEq/L (ref 19–32)
Chloride: 105 mEq/L (ref 96–112)
Creatinine, Ser: 0.62 mg/dL (ref 0.50–1.35)
GFR calc Af Amer: >90 mL/min (ref >90)
GFR calc non Af Amer: >90 mL/min (ref >90)
GLUCOSE: 133 mg/dL — AB (ref 70–99)
Potassium: 3.4 mEq/L — ABNORMAL LOW (ref 3.7–5.3)
Sodium: 149 mEq/L — ABNORMAL HIGH (ref 137–147)

## 2014-01-06 LAB — CBC
HCT: 42.6 % (ref 39.0–52.0)
Hemoglobin: 14 g/dL (ref 13.0–17.0)
MCH: 33.7 pg (ref 26.0–34.0)
MCHC: 32.9 g/dL (ref 30.0–36.0)
MCV: 102.7 fL — ABNORMAL HIGH (ref 78.0–100.0)
PLATELETS: 189 10*3/uL (ref 150–400)
RBC: 4.15 MIL/uL — ABNORMAL LOW (ref 4.22–5.81)
RDW: 13.5 % (ref 11.5–15.5)
WBC: 11.6 10*3/uL — ABNORMAL HIGH (ref 4.0–10.5)

## 2014-01-06 LAB — CULTURE, RESPIRATORY W GRAM STAIN

## 2014-01-06 LAB — CULTURE, RESPIRATORY

## 2014-01-06 MED ORDER — HYDRALAZINE HCL 20 MG/ML IJ SOLN: 10.0000 mg | INTRAMUSCULAR | Status: DC | PRN

## 2014-01-06 MED ORDER — FENTANYL CITRATE 0.05 MG/ML IJ SOLN
25.0000 ug | INTRAMUSCULAR | Status: DC | PRN
  Administered 2014-01-06 – 2014-01-07 (×3): 50 ug via INTRAVENOUS
  Filled 2014-01-06 (×3): qty 2

## 2014-01-06 MED ORDER — CLONIDINE HCL 0.1 MG/24HR TD PTWK
0.1000 mg | MEDICATED_PATCH | TRANSDERMAL | Status: DC
  Administered 2014-01-06: 0.1 mg via TRANSDERMAL
  Filled 2014-01-06: qty 1

## 2014-01-06 MED ORDER — POTASSIUM CL IN DEXTROSE 5% 20 MEQ/L IV SOLN
20.0000 meq | INTRAVENOUS | Status: DC
  Administered 2014-01-06 – 2014-01-08 (×3): 20 meq via INTRAVENOUS
  Filled 2014-01-06 (×6): qty 1000

## 2014-01-06 NOTE — Progress Notes (Signed)
Physical Therapy Treatment Patient Details Name: Maurice Paul MRN: [id number] DOB: 10/28/1945 Today's Date: 01/06/2014    History of Present Illness Pt [facility],  pt intubated for 2 days.  possibly experiencing mulifactorial delirium in form of TBI due to respiratory status. 3/27 pt intubated and moved to 2M02 due to worsening neuro-status, and hypercarbic respiratory failure 01/04/14 extubated    PT Comments    Pt demos improved mobility and cognition today demo'ing improvement in following directions and participating in tasks.  Pt needs frequent re-direction and cues to maintain attention on task.  At this time pt presents as Rancho V level of Confused appropriate.    Follow Up Recommendations  CIR     Equipment Recommendations   (TBD)    Recommendations for Other Services       Precautions / Restrictions Precautions Precautions: Fall Restrictions Weight Bearing Restrictions: No    Mobility  Bed Mobility Overal bed mobility: Needs Assistance;+2 for physical assistance Bed Mobility: Sit to Supine       Sit to supine: Max assist;+2 for physical assistance;+2 for safety/equipment   General bed mobility comments: pt impulsively leaning to supine before lines cleared out of the way.  pt needed cueing to attend to LEs and attempt to bring them into bed.    Transfers Overall transfer level: Needs assistance Equipment used: 2 person hand held assist Transfers: Sit to/from UGI CorporationStand;Stand Pivot Transfers Sit to Stand: Max assist;+2 physical assistance;+2 safety/equipment Stand pivot transfers: Max assist;+2 physical assistance;+2 safety/equipment       General transfer comment: 3rd person present for line management.  cues and facilitation for trunk/hip extension, attending to task.  pt needed A to wt shift, unweighting R LE and A to move R LE through pivot.  Bil knees blocked when coming to stand.     Ambulation/Gait                  Stairs            Wheelchair Mobility    Modified Rankin (Stroke Patients Only)       Balance Overall balance assessment: Needs assistance Sitting-balance support: Bilateral upper extremity supported;Feet supported Sitting balance-Leahy Scale: Poor Sitting balance - Comments: pt level of A fluctuated pending attendtion to task.     Standing balance support: Bilateral upper extremity supported Standing balance-Leahy Scale: Zero                      Cognition Arousal/Alertness: Awake/alert Behavior During Therapy: Impulsive Overall Cognitive Status: Impaired/Different from baseline Area of Impairment: Orientation;Attention;Memory;Following commands;Safety/judgement;Awareness;Problem solving;Rancho level Orientation Level: Disoriented to;Place;Time;Situation Current Attention Level: Sustained Memory: Decreased short-term memory;Decreased recall of precautions Following Commands: Follows one step commands inconsistently;Follows one step commands with increased time Safety/Judgement: Decreased awareness of safety;Decreased awareness of deficits Awareness: Intellectual Problem Solving: Slow processing;Difficulty sequencing;Requires verbal cues;Requires tactile cues;Decreased initiation General Comments: pt confabulatory talking about traveling out of state tomorrow.      Exercises      General Comments        Pertinent Vitals/Pain Did not indicate pain.      Home Living     Available Help at Discharge: Family;Available 24 hours/day Type of Home: House              Prior Function            PT Goals (current goals can now be found in the care plan  section) Acute Rehab PT Goals Time For Goal Achievement: 01/11/14 Potential to Achieve Goals: Good Progress towards PT goals: Progressing toward goals    Frequency  Min 4X/week    PT Plan Current plan remains appropriate    End of Session Equipment Utilized During Treatment:  Gait belt;Oxygen Activity Tolerance: Patient tolerated treatment well Patient left: in bed;with call bell/phone within reach;with restraints reapplied     Time: 0920-1002 PT Time Calculation (min): 42 min  Charges:  $Therapeutic Activity: 8-22 mins                    G CodesSunny Paul, Maurice Paul [phone] 01/06/2014, 10:38 AM

## 2014-01-06 NOTE — Progress Notes (Signed)
Inpatient Rehabilitation  I met with pt's RN Maurice Paul today to follow along with Maurice Paul's progress. Pt. Is currently resting calmly after hours of agitation  Will continue to follow for improvements and readiness for rehab.  Please call if questions.    [facility] Admissions Coordinator Cell [phone] Office [phone]

## 2014-01-06 NOTE — Progress Notes (Deleted)
[facility] FOR [facility] LAB REPLACEMENT ONLY  The [facility] based on the criteria listed below:   1. Is GFR >/= 40 ml/min? yes  Patient's GFR today is >90 2. Is urine output >/= 0.5 ml/kg/hr for the last 6 hours? yes Patient's UOP is 1.9 ml/kg/hr 3. Is BUN < 60 mg/dL? yes  Patient's BUN today is 10 4. Abnormal electrolyte(s): K 3.4 5. Ordered repletion with: per protocol 6. If a panic level lab has been reported, has the CCM MD in charge been notified? no.   Physician:    Markus DaftWHELAN, Jametta Moorehead A 01/06/2014 6:45 AM '

## 2014-01-06 NOTE — Evaluation (Signed)
Speech Language Pathology Evaluation Patient Details Name: Maurice Paul MRN: [id number] DOB: 10/04/1946 Today's Date: 01/06/2014 Time: [id number] SLP Time Calculation (min): 13 min  Problem List:  Patient Active Problem List   Diagnosis Date Noted  . HTN (hypertension) 12/30/2013  . Acute respiratory failure with hypoxia and hypercarbia 12/25/2013  . Metabolic encephalopathy 12/25/2013  . Morbid obesity 12/25/2013  . SAH (subarachnoid hemorrhage) 12/24/2013   Past Medical History:  Past Medical History  Diagnosis Date  . PVD (peripheral vascular disease)   . Tobacco chew use     for 30 years   Past Surgical History:  Past Surgical History  Procedure Laterality Date  . Cyst removal trunk     HPI:  68 y.o. admitted 12/24/2013 after a fall down approximately 8-10 steps and found to by his wife. CT of the head showed subarachnoid hemorrhage over the high left parietal convexities and a small amount of subdural hemorrhage along the falx and tentorium without hydrocephalus or midline shift.  No surgical intervention.   Patient did require short intubation and was extubated 12/26/2013. Pt. admitted to ICU after decreased level of responsiveness, intubated 3/27-3/29.  Initial TBI/cognitive assessment placed pt. as a Rancho V (confused;non-agitated;non appropriate).   Assessment / Plan / Recommendation Clinical Impression  Pt.'s current cognitive presentation is close to initial assessment on 3/21.  He demonstrated qualities of a level V Rancho (confused, inappropriate; non-agitated), however at times he exhibits behaviors of level IV (confused; agitated).  He required mod-max assistance to attend to speaker/activity, problem solve during basic activities and spatial/situational orientation.  ST will continue to see for facilitation of cognitive-communicative abilities.      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[employer])   Cognition  Overall Cognitive Status: Impaired/Different from baseline Arousal/Alertness:  (drowsy due to Haldol) Orientation Level: Oriented to person;Disoriented to place;Disoriented to time;Disoriented to situation Attention: Sustained Sustained Attention: Impaired Sustained Attention Impairment: Verbal basic;Functional basic Memory: Impaired Memory Impairment: Retrieval deficit;Decreased short term memory Decreased Short Term Memory: Verbal basic;Functional basic Awareness: Impaired Awareness Impairment: Intellectual impairment;Emergent impairment;Anticipatory impairment Problem Solving: Impaired Problem Solving Impairment: Verbal basic;Functional basic Safety/Judgment: Impaired Rancho MirantLos Amigos Scales of Cognitive Functioning: Confused/inappropriate/non-agitated    Comprehension  Auditory Comprehension Overall Auditory Comprehension: Impaired Basic Biographical Questions: 51-75% accurate Basic Immediate Environment Questions: 0-24% accurate Commands: Impaired One Step Basic Commands: 0-24% accurate Two Step Basic Commands: 0-24% accurate Interfering Components: Attention;Processing speed;Working Theatre managermemory Visual Recognition/Discrimination Discrimination: Not tested Reading Comprehension Reading Status:  (visual inattention (right?) will assess further)    Expression Expression Primary Mode of Expression: Verbal Verbal Expression Overall Verbal Expression: Impaired Initiation: No impairment Level of Generative/Spontaneous Verbalization: Sentence Naming: Impairment Verbal Errors: Semantic  paraphasias;Language of confusion;Not aware of errors;Perseveration Pragmatics: Impairment Impairments: Abnormal affect;Eye contact;Interpretation of nonverbal communication Interfering Components: Attention Written Expression Dominant Hand: Right Written Expression: Not tested   Oral / Motor Oral Motor/Sensory Function Overall Oral Motor/Sensory Function:  (decreased ROM and strength) Motor Speech Overall Motor Speech: Impaired Respiration: Impaired Level of Impairment: Sentence Phonation: Normal Resonance: Within functional limits Articulation: Within functional limitis Intelligibility:  Intelligibility reduced Word: 75-100% accurate Phrase: 50-74% accurate Sentence: 50-74% accurate Conversation: 25-49% accurate Motor Planning: Witnin functional limits   GO     Breck Coons [employer] [phone]  01/06/2014

## 2014-01-06 NOTE — Progress Notes (Signed)
NEURO HOSPITALIST PROGRESS NOTE   SUBJECTIVE:                                                                                                                        Out of bed, comfortable sitting in a chair. No new neurological developments.  OBJECTIVE:                                                                                                                           Vital signs in last 24 hours: Temp:  [97.6 F (36.4 C)-98.5 F (36.9 C)] 98.5 F (36.9 C) (03/31 0413) Pulse Rate:  [34-82] 65 (03/31 0400) Resp:  [15-27] 20 (03/31 0400) BP: (92-179)/(49-156) 141/99 mmHg (03/31 0800) SpO2:  [89 %-99 %] 95 % (03/31 0400)  Intake/Output from previous day: 03/30 0701 - 03/31 0700 In: 1540 [I.V.:1340; IV Piggyback:200] Out: 1170 [Urine:1170] Intake/Output this shift: Total I/O In: 75 [I.V.:75] Out: 100 [Urine:100] Nutritional status: NPO  Past Medical History  Diagnosis Date  . PVD (peripheral vascular disease)   . Tobacco chew use     for 30 years    Neurologic Exam:  Mental status: alert, awake, oriented to place-year-month- and follows simple commands.  CN 2-12: pupils 2-3 mm bilaterally, reactive to light. No gaze preference. No nystagmus. Face seems to be symmetric. Tongue: central.  Motor: moves all limbs spontaneously and symmetrically.  Sensory: reacts to pain.  DTR's: 1 all over.  Plantars: upgoing.  Coordination and gait: unable to test.  No meningeal signs   Lab Results: No results found for this basename: cbc, bmp, coags, chol, tri, ldl, hga1c   Lipid Panel No results found for this basename: CHOL, TRIG, HDL, CHOLHDL, VLDL, LDLCALC,  in the last 72 hours  Studies/Results: No results found.  MEDICATIONS  I have reviewed the patient's current medications.  ASSESSMENT/PLAN:                                                                                                             Encephalopathy, continuously improving. No further neurological intervention at this moment. Will sign off.   Wyatt Portelasvaldo Eshawn Coor, MD [facility] [phone]  01/06/2014, 9:05 AM

## 2014-01-06 NOTE — Evaluation (Signed)
Clinical/Bedside Swallow Evaluation Patient Details  Name: Maurice Paul MRN: [id number] Date of Birth: 09/03/1946  Today's Date: 01/06/2014 Time: [id number] SLP Time Calculation (min): 28 min  Past Medical History:  Past Medical History  Diagnosis Date  . PVD (peripheral vascular disease)   . Tobacco chew use     for 30 years   Past Surgical History:  Past Surgical History  Procedure Laterality Date  . Cyst removal trunk     HPI:  68 y.o. admitted 12/24/2013 after a fall down approximately 8-10 steps and found to by his wife. CT of the head showed subarachnoid hemorrhage over the high left parietal convexities and a small amount of subdural hemorrhage along the falx and tentorium without hydrocephalus or midline shift.  No surgical intervention.   Patient did require short intubation and was extubated 12/26/2013. Initial swallow assessment recommended Dys 1, thin liquid diet with concern for aspiration on follow up and diet downgraded to Dys 1, nectar liquids.  Pt. admitted to ICU after decreased level of responsiveness, intubated 3/27-3/29.  CXR 3/28 Bibasilar atelectasis and small effusions. Cardiomegaly with vascular congestion, no change.   Assessment / Plan / Recommendation Clinical Impression  Pt. appeared to consume thickened liquids and puree texture without overt s/s aspiration.  He will require full supervision and assist due to TBI and cognitive impairments.  Recommend modified diet texture of Dys 1 due to overall decreased awareness and strength and honey thick.  SLP will follow closely for pt.'s safety with recommnedations and abiltiy to upgrade.    Aspiration Risk  Moderate    Diet Recommendation Dysphagia 1 (Puree);Honey-thick liquid   Liquid Administration via: Cup;No straw Medication Administration: Crushed with puree Supervision: Full supervision/cueing for compensatory strategies;Staff to assist with self feeding Compensations: Slow rate;Small sips/bites;Check for  pocketing Postural Changes and/or Swallow Maneuvers: Seated upright 90 degrees;Upright 30-60 min after meal    Other  Recommendations Oral Care Recommendations: Oral care BID Other Recommendations: Order thickener from pharmacy   Follow Up Recommendations  Inpatient Rehab    Frequency and Duration min 2x/week  2 weeks   Pertinent Vitals/Pain WDL         Swallow Study       Oral/Motor/Sensory Function Overall Oral Motor/Sensory Function:  (decreased ROM and strength)   Ice Chips Ice chips: Not tested   Thin Liquid Thin Liquid: Not tested (due to coughing with thin prior to medical decline)    Nectar Thick Nectar Thick Liquid: Impaired Presentation: Cup;Spoon Oral Phase Impairments: Reduced lingual movement/coordination Pharyngeal Phase Impairments: Suspected delayed Swallow;Decreased hyoid-laryngeal movement   Honey Thick Honey Thick Liquid: Not tested   Puree Puree: Impaired Presentation: Spoon Pharyngeal Phase Impairments: Suspected delayed Swallow   Solid   GO    Solid: Not tested       Royce MacadamiaLisa Willis Willetta York M.Ed [employer]-SLP Pager [phone]  01/06/2014

## 2014-01-06 NOTE — Progress Notes (Signed)
eLink Physician-Brief Progress Note Patient Name: Maurice BaizeWalter S Violet DOB: 11/19/1945 MRN: [id number]  Date of Service  01/07/2014   HPI/Events of Note   reiutine camera exam shows RAS +3 despite posey and po clonidine.  HR 54 and intolerant to precedeex  eICU Interventions  4 point restraints Haldol 5mg  IV x1   Intervention Category Major Interventions: Delirium, psychosis, severe agitation - evaluation and management  Huberta Tompkins 01/07/2014, 12:00 AM

## 2014-01-06 NOTE — Progress Notes (Signed)
PULMONARY / CRITICAL CARE MEDICINE   Name: Maurice Paul MRN: [id number] DOB: 1946-03-29    ADMISSION DATE:  12/24/2013 CONSULTATION DATE:  12/25/2013  REFERRING MD :  [name] PRIMARY SERVICE: PCCM  CHIEF COMPLAINT:  SAH  BRIEF PATIENT DESCRIPTION:  68 year old male with admitted 3/18 with traumatic SAH and SDH. 3/19 mental and respiratory status decompensated and PCCM has been asked to assist with his management. Transferred back to ICU 3/27 after worsening neuro-status, and hypercarbic respiratory failure requiring intubation  SIGNIFICANT EVENTS / STUDIES:  3/18 CT head- Small SAH Left posterior parietal, Persistent SDH along falx and tentorium. 3/19 CT head- No change in amount of intracranial blood, no hydrocephalus.  3/21 MRI >>subarachnoid blood over the vertex and within the interhemispheric fissure 3/23: failed bedside swallow eval/ PCCM signed off  3/24-3/25 continuous EEG: finding consistent with a moderate diffuse or multifocal encephalopathy as can be seen secondary to medication effect, infection or other diffuse metabolic abnormalities. No seizure  3/25 mental status had improved 3/25 MRI: depakote reduced, neurology signed off.  3/26: increased agitation. Pulled out CVL  3/27 PCCM asked to see for hypercarbic resp failure and worsening encephalopathy  3/27 CT head: resolving SAH  3/27 MRI small vol ICH , tr intravent hem, no acute infarct  3/29 Extubated successfully 3/30 Agitated delirium persists 3/31 Agitation persists. Moderate hypertension. Clonidine patch started.   LINES / TUBES: ETT 3/19 >> 3/20; 3/27>> 3/29 LIJ 3/19 >>3/23 >>   CULTURES: Sputum 3/27>> NOF Blood 3/27 >> NEG UC 3/27 >> Klebsiella (pansens)  ANTIBIOTICS: vanc 3/27>> 3/30 Zosyn 3/27>> 3/30  SUBJECTIVE:  Remains agitated at times.   VITAL SIGNS: Temp:  [97.6 F (36.4 C)-98.5 F (36.9 C)] 98.5 F (36.9 C) (03/31 0413) Pulse Rate:  [55-82] 64 (03/31 1000) Resp:  [16-27] 19 (03/31  1000) BP: (118-179)/(52-156) 129/52 mmHg (03/31 1312) SpO2:  [85 %-99 %] 96 % (03/31 1000)  HEMODYNAMICS:    VENTILATOR SETTINGS:    INTAKE / OUTPUT: Intake/Output     03/30 0701 - 03/31 0700 03/31 0701 - 04/01 0700   I.V. (mL/kg) 1340 (10.2) 185 (1.4)   IV Piggyback 200 50   Total Intake(mL/kg) 1540 (11.7) 235 (1.8)   Urine (mL/kg/hr) 1170 (0.4) 190 (0.2)   Total Output 1170 190   Net +370 +45         PHYSICAL EXAMINATION: General: RASS +1. MAEs. F/C intermittently, poorly oriented Neuro: MAEs HEENT: WNL Cardiovascular: RRR s M Lungs: Clear anteriorly Abdomen:  Obese, soft non-distended Ext: warm, no edema  LABS: I have reviewed all of today's lab results. Relevant abnormalities are discussed in the A/P section  CXR: NNF   ASSESSMENT / PLAN:  PULMONARY A: Acute respiratory failure, resolved  P:   Supplemental O2 as needed Monitor resp status closely in ICU  CARDIOVASCULAR A:  HTN Bradycardia, resolved P:  Monitor rhythm and BP  Holding carvedilol Add clonidine patch 3/31  RENAL A:  Hypokalemia, recurrent Hypernatremia P:  Monitor BMET intermittently Monitor I/Os Correct electrolytes as indicated  GASTROINTESTINAL A:  Dysphagia post CVA P:   D1 diet with supervision  HEMATOLOGIC A:  No acute issues P:  Monitor CBC intermittently Minimize phlebotomy  INFECTIOUS A:   Klebsiella UTI, treated P:   Micro and abx as above  ENDOCRINE A:   Hyperglycemia, resolved P:   Monitoring CBGs Resume SSI if glu > 180  NEUROLOGIC A:   Traumatic SAH  And SDH Acute Encephalopathy Severe agitation P:  Minimize BZDs Cont PRN Haldol Clonidine patch initiated 3/31 for htn - might help Monitor in ICU Neuro following    Billy Fischeravid Deyonte Cadden, MD ; [facility] service Mobile [phone].  After 5:30 PM or weekends, call [phone]

## 2014-01-07 DIAGNOSIS — M7989 Other specified soft tissue disorders: Secondary | ICD-10-CM

## 2014-01-07 DIAGNOSIS — I82409 Acute embolism and thrombosis of unspecified deep veins of unspecified lower extremity: Secondary | ICD-10-CM

## 2014-01-07 HISTORY — DX: Acute embolism and thrombosis of unspecified deep veins of unspecified lower extremity: I82.409

## 2014-01-07 MED ORDER — HALOPERIDOL LACTATE 5 MG/ML IJ SOLN
5.0000 mg | Freq: Four times a day (QID) | INTRAMUSCULAR | Status: DC
  Administered 2014-01-07 (×2): 5 mg via INTRAVENOUS
  Filled 2014-01-07 (×5): qty 1

## 2014-01-07 MED ORDER — HALOPERIDOL LACTATE 5 MG/ML IJ SOLN
INTRAMUSCULAR | Status: AC
  Administered 2014-01-07: 5 mg via INTRAVENOUS
  Filled 2014-01-07: qty 1

## 2014-01-07 MED ORDER — QUETIAPINE FUMARATE 100 MG PO TABS
100.0000 mg | ORAL_TABLET | Freq: Every day | ORAL | Status: DC
  Administered 2014-01-07: 100 mg via ORAL
  Filled 2014-01-07 (×2): qty 1

## 2014-01-07 MED ORDER — CEPHALEXIN 250 MG/5ML PO SUSR
500.0000 mg | Freq: Two times a day (BID) | ORAL | Status: DC
  Administered 2014-01-07 – 2014-01-08 (×2): 500 mg via ORAL
  Filled 2014-01-07 (×4): qty 10

## 2014-01-07 MED ORDER — STARCH (THICKENING) PO POWD
ORAL | Status: DC | PRN
Start: 2014-01-07 — End: 2014-01-08
  Filled 2014-01-07: qty 227

## 2014-01-07 MED ORDER — THIAMINE HCL 100 MG/ML IJ SOLN
100.0000 mg | Freq: Every day | INTRAMUSCULAR | Status: DC
  Administered 2014-01-07 – 2014-01-08 (×2): 100 mg via INTRAVENOUS
  Filled 2014-01-07 (×2): qty 1

## 2014-01-07 MED ORDER — ADULT MULTIVITAMIN W/MINERALS CH
1.0000 | ORAL_TABLET | Freq: Every day | ORAL | Status: DC
Start: 2014-01-07 — End: 2014-01-08
  Administered 2014-01-07 – 2014-01-08 (×2): 1 via ORAL
  Filled 2014-01-07 (×2): qty 1

## 2014-01-07 MED ORDER — CEPHALEXIN 500 MG PO CAPS
500.0000 mg | ORAL_CAPSULE | Freq: Two times a day (BID) | ORAL | Status: DC
  Filled 2014-01-07: qty 1

## 2014-01-07 NOTE — Clinical Social Work Note (Addendum)
01/08/14 10:49am- CSW received report that pt will be admitted to CIR.  Bed available today.  CSW signing off.   2:52pm- CSW reviewed bed offers to find that [facility] both declined pt.  CSW will contact wife to inform.  2:42pm- CSW received consult for SNF/STR disposition as a backup to CIR.  CSW spoke with wife who had previously been in touch with Orvan Falconerampbell, LCSWA (step-down CSW).  CSW unaware of previous CSW interaction.  W[facility] [place] [facility]  CSW will assist with disposition and securing a back-up plan prior to dc.  Vickii PennaGina Roanna Reaves, LCSWA [phone]  Clinical Social Work

## 2014-01-07 NOTE — Progress Notes (Signed)
Inpatient Rehabilitation  I have reviewed therapy notes from today and spoken with Bretta BangKris Gellert OT, as well as pt's RN Thayer Ohmhris.  Visited pt. briefly.  I have resubmitted for insurance authorization for possible admission tomorrow, pending medical readiness, bed availability, and insurance approval.  We will f/u tomorrow.  In my absence Thursday and Friday, please contact my co- worker Ottie GlazierBarbara Boyette at [phone].  Weldon PickingSusan Karnell Vanderloop PT Inpatient Rehab Admissions Coordinator Cell [phone] Office [phone]

## 2014-01-07 NOTE — Progress Notes (Signed)
Foley Catheter removed at 10:15 AM 01/07/14  Jacqulyn Canehristopher Scott Ellarae Nevitt RN, BSN, CCRN

## 2014-01-07 NOTE — Discharge Summary (Signed)
Physician Discharge Summary       Patient ID: Maurice Paul MRN: [id number] DOB/AGE: 70/11/1945 70 y.o.  Admit date: 12/24/2013 Discharge date: 01/08/2014  Discharge Diagnoses:   SAH (subarachnoid hemorrhage) Traumatic Brain injury  Acute respiratory failure with hypoxia and hypercarbia Metabolic encephalopathy Morbid obesity HTN (hypertension) Resolved thrombocytopenia Resolved UTI  Resolved possible aspiration pneumonia  Hypokalemia Hypernatremia Macrocytosis without anemia Right LE cellulitis  Symptomatic Provoked LE Distal DVT   Detailed Hospital Course:   68 year old male with admitted 3/18 with traumatic SAH and SDH. 3/19 mental and respiratory status decompensated and PCCM Had been asked to assist with his management. He was discharged from the intensive care on 3/23. His hospital course was notable in his immediate post-ICU course for: failed swallowing evaluation, and persistent encephalopathy. Neurology was following and was concerned that this might be related to subclinical seizure. This was ruled out with continuous EEG. On 3/25 his mental status had improved, and neurology had signed off with recommendations that depakote be weaned off. On 3/36 he was becoming more agitated and was getting Risperdal and symmetrel for agitation. On 3/27 PCCM was called back for clinical worsening; specifically worsening mental status and hypercarbic respiratory failure. He was again transferred to the intensive care where he was intubated for airway protection and neurology was re-consulted. In addition to supportive care we opted to stop the symmetrel and Risperdal given concern that these might be contributing to sedation and possibly his worsening hypercarbia.  He again had a repeat EEG on 3/27 which was negative for seizure and again suggestive of general metabolic encephalopathy. Repeat MRI was negative for acute changes. Of note after intubation he was requiring increased sedation in  form of diprivan gtt to facilitate mechanical ventilation. Weaning was resumed on 3/29 and he was extubated later that day after continuous sedation was stopped. During the following days his delirium persisted, however it was improving. Neurology again signed off and at time of discharge the working explanation for his clinical deterioration on 3/27 was  Multi-factorial: possibly related to symmetrel and Risperdal, +/- infection in setting of UTI, possible aspiration pneumonia. Again agitation and delirium was the primary issue requiring PRN haldol and addition of clonidine for HTN and hopefully additional benefit of assisting with agitation. His antibiotics to treat UTI and possible aspiration PNA were stopped on 3/30. On 4/1 doppler US of LE revealed left peroneal vein DVT most likely provoked in setting of hospital immobilization where he did not receive medical therapy for DVT prophylaxis secondary to [facility] and SDH. Duration of therapy to be later determined. Pt was started on enoxaparin per pharmacy on 4/2 per neurosurgery recommendations. Pt was started on antibiotic for right LE cellulitis on 4/1 to continue for 7 days. He was deemed ready for discharge out of the intensive care and accepted for inpatient rehab on 4/2. His plan of care at time of discharge is outlined below per his active problem list.     Discharge Plan by diagnoses   Traumatic SAH and SDH s/p fall w/ radiographic improvement on serial imaging Acute Encephalopathy s/p Traumatic brain injury w/ Severe agitation and Memory Loss: slowly improving  Plan:  Continue seizure precautions  Minimize BZDs  Seroquel  Fentanyl PRN  Clonidine patch  Stable for transfer to CIR  Safety Sitter  Enoxaparin for left LE DVT safe per neurosurgery  Acute respiratory failure, resolved  Continued O2 dependence, improving  Possible Aspiration pneumonia - resolved, treated with HCAP coverage P:  Supplemental O2 as needed, wean as tolerated    Continue pulse oximetry  Chest xray as needed  HTN, improving  Intermittent Bradycardia, improving  Plan:  Monitor on telemetry  Hydralazine PRN  Clonidine patch  Remove LIJ   Dysphagia post CVA  P:  Dys1 diet with supervision  Food thickener PRN  B1 and multivitamin  Persistent Macrocytosis without anemia - ethanaol neg, B12 normal  P: F/U RBC folate  Monitor CBC intermittently  Thrombocytopenia - resolved  P: Monitor CBC intermittently   Symptomatic Provoked LE Distal DVT - likely due to immobilization without medical DVT ppx during hospitalization due to [facility] and SDH P:  Per neurosurgery, ok to anticoagulate with enoxaparin per pharmacy, total duration to be later determined  Right LE cellulitis P:  F/U blood cultures  Monitor CBC intermittently  Cephalexin started on 4/1 for 7 day course  Leukocytosis, likely secondary to cellulitis   P:  F/U blood cultures  Cephalexin x 7 days   Klebsiella UTI, treated with antibiotics P: Monitor for reoccurance  Hyperglycemia, controlled  P:  F/U HbA1c Monitoring CBGs  Resume SSI if glu > 180    LLE DVT - Peroneal vein clot Discussed with Dr Danielle Dess of [facility] who approved anticoagulation Enoxaparin initiated 4/02 OK to start warfarin any time Would complete 3-6 months full anticoagulation  Suspected RLE cellulitis Cephalexin initiated 4/01 Would complete 7 days of abx therapy   Significant Hospital tests/ studies/ interventions and procedures  SIGNIFICANT EVENTS / STUDIES:  3/18 CT head- Small SAH Left posterior parietal, Persistent SDH along falx and tentorium.  3/19 CT head- No change in amount of intracranial blood, no hydrocephalus.  3/21 MRI >>subarachnoid blood over the vertex and within the interhemispheric fissure  3/23: failed bedside swallow eval/ PCCM signed off  3/24-3/25 continuous EEG: finding consistent with a moderate diffuse or multifocal encephalopathy as can be seen secondary to medication  effect, infection or other diffuse metabolic abnormalities. No seizure  3/25 mental status had improved  3/25 MRI: depakote reduced, neurology signed off.  3/26: increased agitation. Pulled out CVL  3/27 PCCM asked to see for hypercarbic resp failure and worsening encephalopathy, concern for aspiration PNA 3/27 CT head: resolving SAH  3/27: EEG: negative for seizure  3/27 MRI small vol ICH , tr intravent hem, no acute infarct  3/29 Extubated successfully  3/30 Agitated delirium persists  3/31 Agitation persists. Moderate hypertension. Clonidine patch started.  4/01 RLE erythema and increase in calf tenderness c/w cellulitis. BLE venous Dopplers: DVT in LEFT peroneal vein  4/02 AC therapy with enoxaparin safe per neurosurgery. Stable for transfer to CIR today   LINES / TUBES:  ETT 3/19 >> 3/20; 3/27>> 3/29  LIJ 3/19 >>3/23 ; 3/27 >>4/2   CULTURES:  Sputum 3/27>> NOF  Blood 3/27 >> NEG to date  UC 3/27 >> Klebsiella (pansens)   ANTIBIOTICS:  Vanc 3/27>> 3/30  Zosyn 3/27>> 3/30 Keflex 4/1 >>   Discharge Exam: BP 152/71  Pulse 76  Temp(Src) 98 F (36.7 C) (Oral)  Resp 21  Ht 5\' 10"  (1.778 m)  Wt 131.3 kg (289 lb 7.4 oz)  BMI 41.53 kg/m2  SpO2 94%  General: RASS 0. A & O x 1  Neuro: moving all extremities  HEENT: WNL  Cardiovascular: RRR, no murmur  Lungs: Clear anteriorly  Abdomen: Obese, soft non-distended  Ext: B/l LE edema with erythema and warmth R > L   Labs at discharge Lab Results  Component Value Date  CREATININE 0.62 01/06/2014   BUN 10 01/06/2014   NA 149* 01/06/2014   K 3.4* 01/06/2014   CL 105 01/06/2014   CO2 32 01/06/2014   Lab Results  Component Value Date   WBC 11.6* 01/06/2014   HGB 14.0 01/06/2014   HCT 42.6 01/06/2014   MCV 102.7* 01/06/2014   PLT 189 01/06/2014   Lab Results  Component Value Date   ALT 21 12/31/2013   AST 31 12/31/2013   ALKPHOS 62 12/31/2013   BILITOT 1.0 12/31/2013   Lab Results  Component Value Date   INR 1.10  12/24/2013    Current radiology studies Dg Chest Port 1 View  01/08/2014   CLINICAL DATA:  Respiratory failure.  EXAM: PORTABLE CHEST - 1 VIEW  COMPARISON:  Single view of the chest 01/04/2014.  FINDINGS: ET tube and NG tube have been removed. Left IJ central venous catheter remains in place with the tip projecting over the left brachiocephalic vein. Its exact position cannot be determined on this single image. The chest is better expanded than on the prior study with decreased basilar atelectasis. No pneumothorax or pleural effusion.  IMPRESSION: Status post extubation and NG tube removal.  Improve subsegmental atelectasis in the lung bases.  No change in left IJ catheter. Exact position of the tip cannot be determined. It projects over the left brachiocephalic vein.   Electronically Signed   By: Drusilla Kanner M.D.   On: 01/08/2014 07:25    Disposition: [facility]   Medications: per medication reconciliation    Discharged Condition: good  Physician Statement:   The Patient was personally examined, the discharge assessment and plan has been personally reviewed and I agree with ACNP Babcock's assessment and plan. > 30 minutes of time have been dedicated to discharge assessment, planning and discharge instructions.    Billy Fischer, MD ; [facility] [phone].  After 5:30 PM or weekends, call [phone]

## 2014-01-07 NOTE — Progress Notes (Signed)
Made MD Simmonds aware that Right Lower leg is warm and swollen when compared to the left lower leg.  Jacqulyn Canehristopher Scott Vannessa Godown RN, BSN, CCRN

## 2014-01-07 NOTE — Progress Notes (Signed)
Occupational Therapy Treatment Patient Details Name: Maurice Paul MRN: [id number] DOB: 04-Oct-1946 Today's Date: 01/07/2014    History of present illness Pt sustained fall at home resulting in [facility] in [place] par[place]al region,  pt intubated for 2 days.  possibly experiencing mulifactorial delirium in form of TBI due to respiratory status. 3/27 pt intubated and moved to 2M02 due to worsening neuro-status, and hypercarbic respiratory failure 01/04/14 extubated   OT comments  Cotreatment with PT to address arousal, attention, ability to participate in functional obility and basic self care skills.  Patient had a difficult night last night, with increased agitation reported.  Calm this am, but confused, confabulating.  Patient cooperative and following one step directives.  Patient moves quickly with limited awareness of deficits, situation.  Follow Up Recommendations  CIR;Supervision/Assistance - 24 hour    Equipment Recommendations  Other (comment) (to be determined)    Recommendations for Other Services Rehab consult    Precautions / Restrictions Precautions Precautions: Fall Precaution Comments: swallowing Restrictions Weight Bearing Restrictions: No       Mobility Bed Mobility Overal bed mobility: +2 for physical assistance Bed Mobility: Supine to Sit     Supine to sit: Mod assist;+2 for safety/equipment     General bed mobility comments: following instructions, impulsive,   Transfers Overall transfer level: Needs assistance Equipment used: 2 person hand held assist Transfers: Stand Pivot Transfers Sit to Stand: Mod assist;+2 safety/equipment;+2 physical assistance Stand pivot transfers: Mod assist;+2 physical assistance;+2 safety/equipment       General transfer comment: increased tie to process verbal instructions, needed increased guiding of left foot    Balance Overall balance assessment: Needs assistance Sitting-balance support: Feet supported Sitting balance-Leahy  Scale: Poor Sitting balance - Comments: leans left in sitting, increased sway Postural control: Left lateral lean Standing balance support: Bilateral upper extremity supported Standing balance-Leahy Scale: Poor Standing balance comment: increased sway, falls ore to left                   ADL           Lower Body Dressing: Total assistance;Sit to/from stand   Toileting- Architect and Hygiene: +2 for physical assistance;+2 for safety/equipment;Total assistance     General ADL Comments: Moves all extremities, limited mostly by cognition, balance      Vision Eye Alignment: Within Functional Limits                   Perception     Praxis      Cognition   Behavior During Therapy: Restless;Impulsive Overall Cognitive Status: Impaired/Different from baseline Area of Impairment: Orientation;Attention;Memory;Following commands;Safety/judgement;Awareness;Problem solving;Rancho level (Rancho V) Orientation Level: Disoriented to;Place;Time;Situation Current Attention Level: Sustained Memory: Decreased recall of precautions;Decreased short-term memory  Following Commands: Follows one step commands consistently Safety/Judgement: Decreased awareness of safety;Decreased awareness of deficits Awareness: Intellectual (emerging) Problem Solving: Difficulty sequencing;Requires verbal cues;Requires tactile cues;Slow processing General Comments: confabulating, cooperative    Extremity/Trunk Assessment               Exercises       General Comments      Pertinent Vitals/ Pain       Denied pain, room air - sat 88%, 2 liters O2 sats 94-96  Home Living  Prior Functioning/Environment              Frequency Min 3X/week     Progress Toward Goals  OT Goals(current goals can now be found in the care plan section)  Progress towards OT goals: Progressing toward goals  Acute Rehab OT  Goals Patient Stated Goal: none stated  Plan Discharge plan remains appropriate    End of Session Equipment Utilized During Treatment: Gait belt;Oxygen  Activity Tolerance Patient tolerated treatment well   Patient Left in chair;with nursing/sitter in room   Nurse Communication Mobility status        Time: 1007-1040 OT Time Calculation (min): 33 min  Charges: OT General Charges $OT Visit: 1 Procedure OT Treatments $Self Care/Home Management : 8-22 mins  Merleen MillinerGellert, Kristin M 01/07/2014, 10:59 AM

## 2014-01-07 NOTE — Progress Notes (Signed)
Physical Therapy Treatment Note   01/07/14 1042  PT Visit Information  Last PT Received On 01/07/14  Assistance Needed +2  PT/OT/SLP Co-Evaluation/Treatment Yes  Reason for Co-Treatment Necessary to address cognition/behavior during functional activity;For patient/therapist safety  PT goals addressed during session Mobility/safety with mobility;Balance  History of Present Illness [facility] [facility],  pt intubated for 2 days.  possibly experiencing mulifactorial delirium in form of TBI due to respiratory status. 3/27 pt intubated and moved to 2M02 due to worsening neuro-status, and hypercarbic respiratory failure 01/04/14 extubated  PT Time Calculation  PT Start Time 1007  PT Stop Time 1040  PT Time Calculation (min) 33 min  Precautions  Precautions Fall  Cognition  Arousal/Alertness Awake/alert  Behavior During Therapy Impulsive;Restless  Overall Cognitive Status Impaired/Different from baseline  Area of Impairment Orientation;Attention;Memory;Safety/judgement;Awareness;Problem solving  Orientation Level Disoriented to;Place;Time;Situation  Current Attention Level Sustained  Memory Decreased short-term memory  Safety/Judgement Decreased awareness of safety;Decreased awareness of deficits  Awareness (not yet intellectual)  Problem Solving Slow processing;Difficulty sequencing;Requires verbal cues;Requires tactile cues  General Comments knows he is in a hospital; "they say I fell down some steps" but at times says he fell off a ladder  Rancho Levels of Cognitive Functioning  Rancho [email or web address] Amigos Scales of Cognitive Functioning V  Bed Mobility  Overal bed mobility Needs Assistance  Bed Mobility Supine to Sit  Supine to sit Mod assist  General bed mobility comments on ICU bed with mattress deflated; required max cues for sequencing and initiation, however pt then contributes to movement  Transfers  Overall transfer level Needs assistance   Equipment used 2 person hand held assist  Transfers Sit to/from Stand;Stand Pivot Transfers  Sit to Stand Mod assist;+2 physical assistance  Stand pivot transfers Mod assist;+2 physical assistance  General transfer comment pt leans to his Left, assist to weight shift to unweight each foot as he step-pivots to the Left to a chair; required verbal and tactile cues to sequence stepping  Balance  Overall balance assessment Needs assistance  Sitting-balance support Feet supported;Bilateral upper extremity supported  Sitting balance-Leahy Scale Poor  Sitting balance - Comments leans Lt; varied between minguard and mod assist  Postural control Left lateral lean  Standing balance support Bilateral upper extremity supported  Standing balance-Leahy Scale Poor  Standing balance comment no buckling, although max cues to extend knees; leans left  PT - End of Session  Equipment Utilized During Treatment Gait belt;Oxygen  Activity Tolerance Patient tolerated treatment well  Patient left in chair;with nursing/sitter in room  Nurse Communication Mobility status  PT - Assessment/Plan  PT Plan Current plan remains appropriate  PT Frequency Min 4X/week  Follow Up Recommendations CIR;Supervision/Assistance - 24 hour  PT equipment (TBD)  PT Goal Progression  Progress towards PT goals Progressing toward goals  Acute Rehab PT Goals  Time For Goal Achievement 01/11/14  PT General Charges  $$ ACUTE PT VISIT 1 Procedure  PT Treatments  $Therapeutic Activity 8-22 mins    01/07/2014 Maurice Paul, PT Pager: [phone]

## 2014-01-07 NOTE — Progress Notes (Signed)
VASCULAR LAB PRELIMINARY  PRELIMINARY  PRELIMINARY  PRELIMINARY  Bilateral lower extremity venous duplex  completed.    Preliminary report:  Right:  No obvious evidence of DVT, superficial thrombosis, or Baker's cyst.  Left: DVT noted in the mid peroneal vein.  No evidence of superficial thrombosis.  No Baker's cyst.   [place], RVT 01/07/2014, 3:07 PM

## 2014-01-07 NOTE — Progress Notes (Addendum)
PULMONARY / CRITICAL CARE MEDICINE   Name: Maurice Paul MRN: [id number] DOB: 03/02/1946    ADMISSION DATE:  12/24/2013 CONSULTATION DATE:  12/25/2013  REFERRING MD :  Rito EhrlichKrishnan PRIMARY SERVICE: PCCM  CHIEF COMPLAINT:  SAH  BRIEF PATIENT DESCRIPTION:  68 year old male with admitted 3/18 with traumatic SAH and SDH. 3/19 mental and respiratory status decompensated and PCCM has been asked to assist with his management. Transferred back to ICU 3/27 after worsening neuro-status, and hypercarbic respiratory failure requiring intubation.  SIGNIFICANT EVENTS / STUDIES:  3/18 CT head- Small SAH Left posterior parietal, Persistent SDH along falx and tentorium. 3/19 CT head- No change in amount of intracranial blood, no hydrocephalus.  3/21 MRI >>subarachnoid blood over the vertex and within the interhemispheric fissure 3/23: failed bedside swallow eval/ PCCM signed off  3/24-3/25 continuous EEG: finding consistent with a moderate diffuse or multifocal encephalopathy as can be seen secondary to medication effect, infection or other diffuse metabolic abnormalities. No seizure  3/25 mental status had improved 3/25 MRI: depakote reduced, neurology signed off.  3/26: increased agitation. Pulled out CVL  3/27 PCCM asked to see for hypercarbic resp failure and worsening encephalopathy  3/27 CT head: resolving SAH  3/27 MRI small vol ICH , tr intravent hem, no acute infarct  3/29 Extubated successfully 3/30 Agitated delirium persists 3/31 Agitation persists. Moderate hypertension. Clonidine patch started.  4/01 RLE erythema and increase in calf tenderness. BLE venous Dopplers: DVT in LEFT peroneal vein 4/01 Improved, stable for transfer to CIR or SDU  LINES / TUBES: ETT 3/19 >> 3/20; 3/27>> 3/29 LIJ 3/19 >>3/23 ; 3/27 >>    CULTURES: Sputum 3/27>> NOF Blood 3/27 >> NEG to date  UC 3/27 >> Klebsiella (pansens)  ANTIBIOTICS: Vanc 3/27>> 3/30 Zosyn 3/27>> 3/30  SUBJECTIVE:   Pt alert and  orientated x 1 (self) with improved agitation. No current complaints. Lower extremity with chronic venous insufficiency, today right worse than left.   VITAL SIGNS: Temp:  [97.5 F (36.4 C)-98.1 F (36.7 C)] 98 F (36.7 C) (04/01 0700) Pulse Rate:  [50-76] 76 (04/01 1100) Resp:  [15-27] 21 (04/01 1100) BP: (113-188)/(47-94) 152/71 mmHg (04/01 1000) SpO2:  [94 %-100 %] 94 % (04/01 1100)  HEMODYNAMICS:    VENTILATOR SETTINGS:    INTAKE / OUTPUT: Intake/Output     03/31 0701 - 04/01 0700 04/01 0701 - 04/02 0700   P.O. 200 360   I.V. (mL/kg) 1285 (9.8) 200 (1.5)   IV Piggyback 50    Total Intake(mL/kg) 1535 (11.7) 560 (4.3)   Urine (mL/kg/hr) 938 (0.3) 140 (0.2)   Total Output 938 140   Net +597 +420        Stool Occurrence 3 x     PHYSICAL EXAMINATION: General: RASS 0. A & O x 1  Neuro: MAEs HEENT: WNL Cardiovascular: RRR, no murmur  Lungs: Clear anteriorly Abdomen:  Obese, soft non-distended Ext: warm, right LE edema with erythema and warmth > left LE  LABS: I have reviewed all of today's lab results. Relevant abnormalities are discussed in the A/P section   ASSESSMENT / PLAN:  PULMONARY A: Acute respiratory failure, resolved  Continued O2 dependence P:   Supplemental O2 as needed, wean as tolerated Monitor resp status closely in ICU CXR tomm AM  CARDIOVASCULAR A:  HTN, improving Bradycardia, improving Unilateral LE edema   P:  Monitor rhythm and BP  Holding carvedilol Hydralazine PRN  Continue clonidine patch  Obtain b/l DUS Place SCD's  RENAL  A:  Hypokalemia, recurrent      Mild Hypernatremia P:  Monitor BMET intermittently Monitor I/Os Correct electrolytes as indicated  GASTROINTESTINAL A:  Dysphagia post CVA P:   Dys1 diet with supervision Food thickener PRN Add B1 and multivitamin  HEMATOLOGIC A: Persistent  Macrocytosis (ethanaol neg on admission)    Thrombocytopenia - resolved P:  Obtain B12 level and RBC folate  Monitor CBC  intermittently Minimize phlebotomy  INFECTIOUS A:   Klebsiella UTI, treated Leukocytosis, likely reactive  P:   Following blood cultures  ENDOCRINE A:   Hyperglycemia, controlled P:   Monitoring CBGs Resume SSI if glu > 180  NEUROLOGIC A:   Traumatic SAH and SDH s/p fall Acute Encephalopathy Severe agitation P:   Seizure precautions Minimize BZDs Cont Seroquel  Cont Fentanyl PRN  Cont Clonidine patch  Neuro following Stable for transfer   OT/PT consults - CIR Safety Sitter   Code: Full    Billy Fischer, MD ; [facility] service Mobile [phone].  After 5:30 PM or weekends, call [phone]

## 2014-01-08 ENCOUNTER — Inpatient Hospital Stay (HOSPITAL_COMMUNITY)
Admission: RE | Admit: 2014-01-08 | Discharge: 2014-01-22 | DRG: 945 | Disposition: A | Discharge status: Home / Self Care | Payer: Medicare Other | Source: Intra-hospital | Attending: Physical Medicine & Rehabilitation | Admitting: Physical Medicine & Rehabilitation

## 2014-01-08 ENCOUNTER — Inpatient Hospital Stay (HOSPITAL_COMMUNITY): Payer: Medicare Other

## 2014-01-08 DIAGNOSIS — S066X9A Traumatic subarachnoid hemorrhage with loss of consciousness of unspecified duration, initial encounter: Secondary | ICD-10-CM

## 2014-01-08 DIAGNOSIS — S069XAA Unspecified intracranial injury with loss of consciousness status unknown, initial encounter: Secondary | ICD-10-CM

## 2014-01-08 DIAGNOSIS — N390 Urinary tract infection, site not specified: Secondary | ICD-10-CM | POA: Diagnosis present

## 2014-01-08 DIAGNOSIS — J9601 Acute respiratory failure with hypoxia: Secondary | ICD-10-CM

## 2014-01-08 DIAGNOSIS — S066X0A Traumatic subarachnoid hemorrhage without loss of consciousness, initial encounter: Secondary | ICD-10-CM | POA: Diagnosis present

## 2014-01-08 DIAGNOSIS — S065X9A Traumatic subdural hemorrhage with loss of consciousness of unspecified duration, initial encounter: Secondary | ICD-10-CM | POA: Diagnosis present

## 2014-01-08 DIAGNOSIS — B961 Klebsiella pneumoniae [K. pneumoniae] as the cause of diseases classified elsewhere: Secondary | ICD-10-CM | POA: Diagnosis present

## 2014-01-08 DIAGNOSIS — I10 Essential (primary) hypertension: Secondary | ICD-10-CM | POA: Diagnosis present

## 2014-01-08 DIAGNOSIS — S065X0A Traumatic subdural hemorrhage without loss of consciousness, initial encounter: Secondary | ICD-10-CM

## 2014-01-08 DIAGNOSIS — S069X9A Unspecified intracranial injury with loss of consciousness of unspecified duration, initial encounter: Secondary | ICD-10-CM

## 2014-01-08 DIAGNOSIS — J9602 Acute respiratory failure with hypercapnia: Secondary | ICD-10-CM

## 2014-01-08 DIAGNOSIS — W108XXA Fall (on) (from) other stairs and steps, initial encounter: Secondary | ICD-10-CM | POA: Diagnosis present

## 2014-01-08 DIAGNOSIS — R1310 Dysphagia, unspecified: Secondary | ICD-10-CM | POA: Diagnosis present

## 2014-01-08 DIAGNOSIS — Y92009 Unspecified place in unspecified non-institutional (private) residence as the place of occurrence of the external cause: Secondary | ICD-10-CM

## 2014-01-08 DIAGNOSIS — Z5189 Encounter for other specified aftercare: Principal | ICD-10-CM

## 2014-01-08 DIAGNOSIS — I82409 Acute embolism and thrombosis of unspecified deep veins of unspecified lower extremity: Secondary | ICD-10-CM

## 2014-01-08 DIAGNOSIS — I609 Nontraumatic subarachnoid hemorrhage, unspecified: Secondary | ICD-10-CM

## 2014-01-08 DIAGNOSIS — F17200 Nicotine dependence, unspecified, uncomplicated: Secondary | ICD-10-CM | POA: Diagnosis present

## 2014-01-08 DIAGNOSIS — R4182 Altered mental status, unspecified: Secondary | ICD-10-CM

## 2014-01-08 DIAGNOSIS — G9340 Encephalopathy, unspecified: Secondary | ICD-10-CM

## 2014-01-08 DIAGNOSIS — S065XAA Traumatic subdural hemorrhage with loss of consciousness status unknown, initial encounter: Secondary | ICD-10-CM | POA: Diagnosis present

## 2014-01-08 DIAGNOSIS — S066XAA Traumatic subarachnoid hemorrhage with loss of consciousness status unknown, initial encounter: Secondary | ICD-10-CM

## 2014-01-08 DIAGNOSIS — G9341 Metabolic encephalopathy: Secondary | ICD-10-CM

## 2014-01-08 DIAGNOSIS — J9600 Acute respiratory failure, unspecified whether with hypoxia or hypercapnia: Secondary | ICD-10-CM

## 2014-01-08 LAB — BASIC METABOLIC PANEL
BUN: 6 mg/dL (ref 6–23)
CALCIUM: 8.2 mg/dL — AB (ref 8.4–10.5)
CO2: 36 mEq/L — ABNORMAL HIGH (ref 19–32)
Chloride: 103 mEq/L (ref 96–112)
Creatinine, Ser: 0.68 mg/dL (ref 0.50–1.35)
GFR calc Af Amer: >90 mL/min (ref >90)
Glucose, Bld: 101 mg/dL — ABNORMAL HIGH (ref 70–99)
POTASSIUM: 3.3 meq/L — AB (ref 3.7–5.3)
SODIUM: 148 meq/L — AB (ref 137–147)

## 2014-01-08 LAB — CULTURE, BLOOD (ROUTINE X 2)
Culture: NO GROWTH
Culture: NO GROWTH

## 2014-01-08 LAB — VITAMIN B12: VITAMIN B 12: 579 pg/mL (ref 211–911)

## 2014-01-08 LAB — FOLATE RBC: RBC FOLATE: 504 ng/mL (ref >280)

## 2014-01-08 MED ORDER — CEPHALEXIN 250 MG/5ML PO SUSR
500.0000 mg | Freq: Two times a day (BID) | ORAL | Status: DC
  Administered 2014-01-08 – 2014-01-16 (×16): 500 mg via ORAL
  Filled 2014-01-08 (×19): qty 10

## 2014-01-08 MED ORDER — ENOXAPARIN SODIUM 150 MG/ML ~~LOC~~ SOLN
130.0000 mg | Freq: Two times a day (BID) | SUBCUTANEOUS | Status: DC
  Administered 2014-01-08 – 2014-01-11 (×7): 130 mg via SUBCUTANEOUS
  Filled 2014-01-08 (×10): qty 1

## 2014-01-08 MED ORDER — BIOTENE DRY MOUTH MT LIQD
15.0000 mL | Freq: Two times a day (BID) | OROMUCOSAL | Status: DC
  Administered 2014-01-08 – 2014-01-21 (×20): 15 mL via OROMUCOSAL

## 2014-01-08 MED ORDER — ENSURE PUDDING PO PUDG: 1.0000 | Freq: Three times a day (TID) | ORAL | Status: DC

## 2014-01-08 MED ORDER — POTASSIUM CHLORIDE 10 MEQ/50ML IV SOLN: 10.0000 meq | INTRAVENOUS | Status: DC

## 2014-01-08 MED ORDER — SORBITOL 70 % SOLN
30.0000 mL | Freq: Every day | Status: DC | PRN
  Administered 2014-01-14 – 2014-01-21 (×3): 30 mL via ORAL
  Filled 2014-01-08 (×3): qty 30

## 2014-01-08 MED ORDER — THIAMINE HCL 100 MG/ML IJ SOLN
100.0000 mg | Freq: Every day | INTRAMUSCULAR | Status: AC
  Administered 2014-01-09 – 2014-01-11 (×3): 100 mg via INTRAVENOUS
  Filled 2014-01-08 (×3): qty 1

## 2014-01-08 MED ORDER — ADULT MULTIVITAMIN W/MINERALS CH
1.0000 | ORAL_TABLET | Freq: Every day | ORAL | Status: DC
  Administered 2014-01-09 – 2014-01-22 (×14): 1 via ORAL
  Filled 2014-01-08 (×16): qty 1

## 2014-01-08 MED ORDER — ACETAMINOPHEN 650 MG RE SUPP: 650.0000 mg | Freq: Four times a day (QID) | RECTAL | Status: DC | PRN

## 2014-01-08 MED ORDER — POTASSIUM CHLORIDE CRYS ER 20 MEQ PO TBCR
40.0000 meq | EXTENDED_RELEASE_TABLET | Freq: Once | ORAL | Status: AC
  Administered 2014-01-08: 40 meq via ORAL
  Filled 2014-01-08: qty 2

## 2014-01-08 MED ORDER — ONDANSETRON HCL 4 MG/2ML IJ SOLN: 4.0000 mg | Freq: Four times a day (QID) | INTRAMUSCULAR | Status: DC | PRN

## 2014-01-08 MED ORDER — QUETIAPINE FUMARATE 100 MG PO TABS
100.0000 mg | ORAL_TABLET | Freq: Every day | ORAL | Status: DC
  Administered 2014-01-08 – 2014-01-21 (×14): 100 mg via ORAL
  Filled 2014-01-08 (×15): qty 1

## 2014-01-08 MED ORDER — STARCH (THICKENING) PO POWD
ORAL | Status: DC | PRN
  Filled 2014-01-08: qty 227

## 2014-01-08 MED ORDER — ENOXAPARIN SODIUM 150 MG/ML ~~LOC~~ SOLN
130.0000 mg | Freq: Two times a day (BID) | SUBCUTANEOUS | Status: DC
  Administered 2014-01-08: 130 mg via SUBCUTANEOUS
  Filled 2014-01-08 (×2): qty 1

## 2014-01-08 MED ORDER — CLONIDINE HCL 0.1 MG/24HR TD PTWK
0.1000 mg | MEDICATED_PATCH | TRANSDERMAL | Status: DC
  Administered 2014-01-13 – 2014-01-20 (×2): 0.1 mg via TRANSDERMAL
  Filled 2014-01-08 (×2): qty 1

## 2014-01-08 MED ORDER — POTASSIUM CL IN DEXTROSE 5% 20 MEQ/L IV SOLN
20.0000 meq | INTRAVENOUS | Status: DC
  Administered 2014-01-10: 20 meq via INTRAVENOUS
  Filled 2014-01-08 (×4): qty 1000

## 2014-01-08 MED ORDER — ONDANSETRON HCL 4 MG PO TABS: 4.0000 mg | ORAL_TABLET | Freq: Four times a day (QID) | ORAL | Status: DC | PRN

## 2014-01-08 NOTE — Progress Notes (Addendum)
PULMONARY / CRITICAL CARE MEDICINE   Name: Maurice Paul MRN: [id number] DOB: 09/09/1946    ADMISSION DATE:  12/24/2013 CONSULTATION DATE:  12/25/2013  REFERRING MD :  Rito EhrlichKrishnan PRIMARY SERVICE: PCCM  CHIEF COMPLAINT:  SAH  BRIEF PATIENT DESCRIPTION:  68 year old male with admitted 3/18 with traumatic SAH and SDH. 3/19 mental and respiratory status decompensated and PCCM has been asked to assist with his management. Transferred back to ICU 3/27 after worsening neuro-status, and hypercarbic respiratory failure requiring intubation.  SIGNIFICANT EVENTS / STUDIES:  3/18 CT head- Small SAH Left posterior parietal, Persistent SDH along falx and tentorium. 3/19 CT head- No change in amount of intracranial blood, no hydrocephalus.  3/21 MRI >>subarachnoid blood over the vertex and within the interhemispheric fissure 3/23: failed bedside swallow eval/ PCCM signed off  3/24-3/25 continuous EEG: finding consistent with a moderate diffuse or multifocal encephalopathy as can be seen secondary to medication effect, infection or other diffuse metabolic abnormalities. No seizure  3/25 mental status had improved 3/25 MRI: depakote reduced, neurology signed off.  3/26: increased agitation. Pulled out CVL  3/27 PCCM asked to see for hypercarbic resp failure and worsening encephalopathy  3/27 CT head: resolving SAH  3/27 MRI small vol ICH , tr intravent hem, no acute infarct  3/29 Extubated successfully 3/30 Agitated delirium persists 3/31 Agitation persists. Moderate hypertension. Clonidine patch started.  4/01 RLE erythema and increase in calf tenderness c/w cellulitis. BLE venous Dopplers: DVT in LEFT peroneal vein 4/02 AC therapy with enoxaparin safe per neurosurgery.Stable for transfer to CIR today    LINES / TUBES: ETT 3/19 >> 3/20; 3/27>> 3/29 LIJ 3/19 >>3/23 ; 3/27 >>4/2    CULTURES: Sputum 3/27>> NOF Blood 3/27 >> NEG to date  UC 3/27 >> Klebsiella (pansens)  ANTIBIOTICS: Vanc 3/27>>  3/30 Zosyn 3/27>> 3/30 Keflex 4/1 >>  SUBJECTIVE:   Pt alert and orientated x 1 with improving memory. No current complaints. Denies HA, weakness, dyspnea, or worsening LE edema and calf tenderness.     VITAL SIGNS: Temp:  [97.9 F (36.6 C)-98.8 F (37.1 C)] 97.9 F (36.6 C) (04/02 0417) Pulse Rate:  [50-76] 63 (04/02 0451) Resp:  [14-22] 18 (04/02 0451) BP: (123-152)/(55-76) 136/55 mmHg (04/02 0451) SpO2:  [86 %-98 %] 94 % (04/02 0451)    INTAKE / OUTPUT: Intake/Output     04/01 0701 - 04/02 0700 04/02 0701 - 04/03 0700   P.O. 1080    I.V. (mL/kg) 1150 (8.8)    IV Piggyback     Total Intake(mL/kg) 2230 (17)    Urine (mL/kg/hr) 1265 (0.4)    Total Output 1265     Net +965          Urine Occurrence 2 x     PHYSICAL EXAMINATION: General: RASS 0. A & O x 1  Neuro: moving all extremities HEENT: WNL Cardiovascular: RRR, no murmur  Lungs: Clear anteriorly Abdomen:  Obese, soft non-distended Ext: B/l LE edema with erythema R > L  LABS: I have reviewed all of today's lab results. Relevant abnormalities are discussed in the A/P section   ASSESSMENT / PLAN:  PULMONARY A: Acute respiratory failure, resolved  Continued O2 dependence, improving P:   Supplemental O2 as needed, wean as tolerated Pulse oximetry   CARDIOVASCULAR A:  HTN, improving Intermittent Bradycardia, improving P:  Monitor on telemetry   Hydralazine PRN  Clonidine patch  Remove LIJ  RENAL A:  Hypokalemia      Hypernatremia P:  Monitor  BMET intermittently Monitor I/Os Correct electrolytes as indicated Continue D5 KCl   GASTROINTESTINAL A:  Dysphagia post CVA P:   Dys1 diet with supervision Food thickener PRN B1 and multivitamin  HEMATOLOGIC A: Persistent  Macrocytosis-  B12 and ethanol wnl    Thrombocytopenia - resolved    Symptomatic Provoked LE Distal DVT - likely due to immobilization  P:  Awaiting RBC folate  Monitor CBC intermittently Minimize phlebotomy Per  neurosurgery, ok to anticoagulate with enoxaparin per pharmacy, total duration to be later determined  INFECTIOUS A:   Klebsiella UTI, treated Right LE cellulitis  Leukocytosis, likely secondary to above   P:   Following blood cultures Monitor CBC intermittently  Cephalexin started on 4/1 for 7 day course  ENDOCRINE A:   Hyperglycemia, controlled P:   Monitoring CBGs Resume SSI if glu > 180  NEUROLOGIC A:   Traumatic SAH and SDH s/p fall Acute Encephalopathy Severe agitation P:   Seizure precautions Minimize BZDs Seroquel  Fentanyl PRN  Clonidine patch  Stable for transfer to CIR Safety Sitter   Code: Full   PCCM ATTENDING: I have interviewed and examined the patient and reviewed the database. I have formulated the assessment and plan as reflected in the note above with amendments made by me. Pt is ready for DC to CIR  Billy Fischer, MD;  PCCM service; Mobile [phone]

## 2014-01-08 NOTE — PMR Pre-admission (Signed)
PMR Admission Coordinator Pre-Admission Assessment  Patient: Maurice Paul is an 68 y.o., male MRN: [id number] DOB: 11/15/1945 Height: 5\' 10"  (177.8 cm) Weight: 131.3 kg (289 lb 7.4 oz)              Insurance Information HMO: yes    PPO:      PCP:      IPA:      80/20:      OTHER:  PRIMARY: Blue Medicare      Policy#: [id number]      Subscriber: self CM Name:        Phone#:      Fax#: [phone] Follow up will be with                  . Clinical updates due on 4-  -15.    Pre-Cert#:                    Employer: retired (received Engineer, siteverbal approval from Eugenio Hoesedric Clark, [employer] on 01-08-14)  Benefits:  Phone #: [phone]     Name:  Eff. Date: 10-09-13     Deduct: none      Out of Pocket Max: $4900      Life Max: none CIR: $285 per day (days 1-6)       SNF: $0 copay days 1-20; $60 copay days 21-100 (100 day max) Outpatient:      Co-Pay: $40 copay per visit (no visit limit) Home Health: 100%      Co-Pay: none, no visit limits  DME: 80%     Co-Pay: 20% Providers: in network  SECONDARY: [employer]      Policy#: [id number]        Emergency Contact Information Contact Information   Name Relation Home Work Mobile   Uehara,Dianne Spouse [phone]  [phone]   Catha GosselinDruce,Bryan Son [phone]       Current Medical History  Patient Admitting Diagnosis: SAH/SDH  History of Present Illness: Maurice Paul is a 68 y.o. right-handed male with history of tobacco abuse. Admitted 12/24/2013 after a fall down approximately 8-10 steps and found to by his wife. No report of loss of consciousness. CT of the head showed subarachnoid hemorrhage over the high left parietal convexities and a small amount of subdural hemorrhage along the falx and tentorium without hydrocephalus or midline shift. CT cervical spine negative. Neurosurgery consulted Dr. Barnett AbuHenry Elsner with conservative care. Followup MRI of the brain 12/27/2013 without change. Patient did require short intubation and was extubated  12/26/2013. Maintained on Keppra for seizure prophylaxis. Currently maintained on a dysphagia 1 thin liquid diet. Bouts of delirium and agitation. Physical therapy evaluation completed 12/28/2013 at the recommendations of physical medicine rehabilitation consult.   Past Medical History  Past Medical History  Diagnosis Date  . PVD (peripheral vascular disease)   . Tobacco chew use     for 30 years    Family History  family history is not on file.  Prior Rehab/Hospitalizations: none   Current Medications  Current facility-administered medications:acetaminophen (TYLENOL) suppository 650 mg, 650 mg, Rectal, Q6H PRN, Belkys A Regalado, MD, 650 mg at 12/25/13 2329;  antiseptic oral rinse (BIOTENE) solution 15 mL, 15 mL, Mouth Rinse, q12n4p, Merwyn Katosavid B Simonds, MD, 15 mL at 01/07/14 1221;  cephALEXin (KEFLEX) 250 MG/5ML suspension 500 mg, 500 mg, Oral, Q12H, Marjan Rabbani, MD, 500 mg at 01/08/14 0756 cloNIDine (CATAPRES - Dosed in mg/24 hr) patch 0.1 mg, 0.1 mg, Transdermal, Q Tue, Merwyn Katosavid B Simonds, MD,  0.1 mg at 01/06/14 1312;  dextrose 5 % with KCl 20 mEq / L  infusion, 20 mEq, Intravenous, Continuous, Merwyn Katos, MD, Last Rate: 50 mL/hr at 01/08/14 0548, 20 mEq at 01/08/14 0548;  enoxaparin (LOVENOX) injection 130 mg, 130 mg, Subcutaneous, Q12H, Gala Lewandowsky [place], RPH;  food thickener (THICK IT) powder, , Oral, PRN, Merwyn Katos, MD hydrALAZINE (APRESOLINE) injection 10 mg, 10 mg, Intravenous, Q2H PRN, Merwyn Katos, MD;  multivitamin with minerals tablet 1 tablet, 1 tablet, Oral, Daily, Merwyn Katos, MD, 1 tablet at 01/08/14 1011;  ondansetron (ZOFRAN) injection 4 mg, 4 mg, Intravenous, Q6H PRN, Belkys A Regalado, MD;  QUEtiapine (SEROQUEL) tablet 100 mg, 100 mg, Oral, QHS, Merwyn Katos, MD, 100 mg at 01/07/14 2224 thiamine (B-1) injection 100 mg, 100 mg, Intravenous, Daily, Merwyn Katos, MD, 100 mg at 01/08/14 1016  Patients Current Diet: Dysphagia level 1, honey thick (wife  notes that his dentures have been lost since being hospitalized)  Precautions / Restrictions Precautions Precautions: Fall Precaution Comments: swallowing Restrictions Weight Bearing Restrictions: No   Prior Activity Level Limited Community (1-2x/wk): Per wife, pt. drives and goes out of the house several times a week.  Wife  also reports pt. does alot of work on computers for himself and his family.    Home Assistive Devices / Equipment Home Assistive Devices/Equipment: None Home Equipment: None  Prior Functional Level Prior Function Level of Independence: Independent Gait / Transfers Assistance Needed: independent ADL's / Homemaking Assistance Needed: minA to donn/doff socks due to body habitus  Current Functional Level Cognition  Arousal/Alertness:  (drowsy due to Haldol) Overall Cognitive Status: Impaired/Different from baseline Current Attention Level: Sustained Orientation Level: Oriented to person;Disoriented to place;Disoriented to time;Disoriented to situation Following Commands: Follows one step commands consistently Safety/Judgement: Decreased awareness of safety;Decreased awareness of deficits General Comments: confabulating, cooperative Attention: Sustained Sustained Attention: Impaired Sustained Attention Impairment: Verbal basic;Functional basic Memory: Impaired Memory Impairment: Retrieval deficit;Decreased short term memory Decreased Short Term Memory: Verbal basic;Functional basic Awareness: Impaired Awareness Impairment: Intellectual impairment;Emergent impairment;Anticipatory impairment Problem Solving: Impaired Problem Solving Impairment: Verbal basic;Functional basic Safety/Judgment: Impaired Rancho Mirant Scales of Cognitive Functioning: Confused/inappropriate/non-agitated    Extremity Assessment (includes Sensation/Coordination)          ADLs  Overall ADL's : Needs assistance/impaired Eating/Feeding: Moderate assistance;Cueing for  sequencing;Cueing for safety;Sitting (cuing for safety. unaware of precaqutions) Grooming: Moderate assistance;Sitting;Cueing for safety;Cueing for sequencing Lower Body Dressing: Total assistance;Sit to/from stand Toileting- Architect and Hygiene: +2 for physical assistance;+2 for safety/equipment;Total assistance General ADL Comments: Moves all extremities, limited mostly by cognition, balance    Mobility  Overal bed mobility: +2 for physical assistance Bed Mobility: Supine to Sit Rolling: Min guard (rolls right spontaneously without A, minguard is only due to safety) Sidelying to sit: +2 for physical assistance;+2 for safety/equipment;Max assist Supine to sit: Mod assist;+2 for safety/equipment Sit to supine: Max assist;+2 for physical assistance;+2 for safety/equipment General bed mobility comments: following instructions, impulsive,     Transfers  Overall transfer level: Needs assistance Equipment used: 2 person hand held assist Transfers: Stand Pivot Transfers Sit to Stand: Mod assist;+2 safety/equipment;+2 physical assistance Stand pivot transfers: Mod assist;+2 physical assistance;+2 safety/equipment General transfer comment: increased tie to process verbal instructions, needed increased guiding of left foot    Ambulation / Gait / Stairs / Wheelchair Mobility  Ambulation/Gait Ambulation/Gait assistance:  (not yet appropriate)    Posture / Balance Dynamic Sitting Balance Sitting balance - Comments: leans  left in sitting, increased sway    Special needs/care consideration BiPAP/CPAP no CPM no  Continuous Drip IV no  Dialysis no           Life Vest no  Oxygen - currently on 2 L O2  Special Bed no Trach Size no Wound Vac (area) no       Skin - dry skin, abrasion on head/leg, also with sacral dressing                              L Bowel mgmt: last BM on 01-07-14 Bladder mgmt: using urinal with assist Diabetic mgmt - no    Previous Home Environment Living  Arrangements: Spouse/significant other  Lives With: Spouse Available Help at Discharge: Family;Available 24 hours/day Type of Home: House Home Layout: Two level;Able to live on main level with bedroom/bathroom Alternate Level Stairs-Number of Steps: flight Home Access: Stairs to enter Entrance Stairs-Rails: Left Entrance Stairs-Number of Steps: 4 Bathroom Shower/Tub: Engineer, manufacturing systems: Standard Home Care Services: No Additional Comments: wife available for PLOF and home setup information   Discharge Living Setting Plans for Discharge Living Setting: Patient's home Type of Home at Discharge: House Discharge Home Layout: Two level;Able to live on main level with bedroom/bathroom Discharge Home Access: Stairs to enter Entrance Stairs-Rails: Left Entrance Stairs-Number of Steps: 4 Discharge Bathroom Shower/Tub: Tub/shower unit Discharge Bathroom Toilet: Standard Discharge Bathroom Accessibility: Yes How Accessible: Accessible via walker Does the patient have any problems obtaining your medications?: No  Social/Family/Support Systems Patient Roles: Spouse Contact Information: wife [name] is primary contact Anticipated Caregiver: wife [name] Anticipated Industrial/product designer Information: see above Ability/Limitations of Caregiver: wife reports she is able to physically assist pt. at min to mod level  Caregiver Availability: 24/7 Discharge Plan Discussed with Primary Caregiver: Yes (discussed with wife personally on 01-08-14) Is Caregiver In Agreement with Plan?: Yes Does Caregiver/Family have Issues with Lodging/Transportation while Pt is in Rehab?: No  Goals/Additional Needs Patient/Family Goal for Rehab: (S) with PT; (S) to min assist with OT, (S) with SLP Expected length of stay: 18-22 days Cultural Considerations: no Dietary Needs:Dysphagia 1, honey thick. Note that pt's dentures got lost while hospitalized. 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  He has continued with former sleeping habits and goes to bed between 4-6 am, sleeps till 2 pm. Pt/Family Agrees to Admission and willing to participate: Yes Program Orientation Provided & Reviewed with Pt/Caregiver Including Roles  & Responsibilities: Yes   Decrease burden of Care through IP rehab admission: NA   Possible need for SNF placement upon discharge: not anticipated   Patient Condition: This patient's medical and functional status has changed since the consult dated: 12-30-13 in which the Rehabilitation Physician determined and documented that the patient's condition is appropriate for intensive rehabilitative care in an inpatient rehabilitation facility. See "History of Present Illness" (above) for medical update. Functional changes are: Moderate assist of 2 for transfers. Patient's medical and functional status update has been discussed with the Rehabilitation physician and patient remains appropriate for inpatient rehabilitation. Will admit to inpatient rehab today.  Preadmission Screen Completed By:  Juliann Mule, PT 01/08/2014 12:30 PM ______________________________________________________________________   Discussed status with Dr. Wynn Banker on 01-08-14 at 1234 and received telephone approval for admission today.  Admission Coordinator:  Juliann Mule, PT time 1234/Date4-2-15

## 2014-01-08 NOTE — Progress Notes (Signed)
NUTRITION FOLLOW UP  Intervention:   1.  Modify diet; encourage PO intake of dysphagia diet. 2.  Supplements; Ensure Pudding po TID, each supplement provides 170 kcal and 4 grams of protein.   Nutrition Dx:   Inadequate oral intake, progressing  Goal:  Pt to meet >/= 90% of their estimated nutrition needs   Monitor:  Vent status, initiation and tolerance of TF, weight trend   Assessment:  Pt admitted 3/18 with traumatic SAH and SDH.  Pt plans to transfer to in-patient rehab. Pt becomes distracted during conversation. Pt has sitter who reports that pt ate 100% of his Breakfast but only about 20% of his lunch. Pt did drink his honey-thickened liquids at lunch.  Potassium is low.   Height: Ht Readings from Last 1 Encounters:  01/02/14 5\' 10"  (1.778 m)    Weight Status:   Wt Readings from Last 1 Encounters:  01/04/14 289 lb 7.4 oz (131.3 kg)  Admission weight 298 lb (135.6 kg)  Re-estimated needs:  Kcal: 2250-2600 Protein: 105-130g Fluid: >2.2 L/day  Skin: leg abrasion  Diet Order: Dysphagia 1 with Honey thick liquids Meal Completion: 20-100%   Intake/Output Summary (Last 24 hours) at 01/08/14 1457 Last data filed at 01/08/14 1400  Gross per 24 hour  Intake   1500 ml  Output   1475 ml  Net     25 ml    Last BM: 4/2  Labs:   Recent Labs Lab 01/04/14 0436 01/04/14 1202 01/05/14 0300 01/06/14 0530 01/08/14 0445  NA 146  --  149* 149* 148*  K 3.2*  --  3.4* 3.4* 3.3*  CL 105  --  105 105 103  CO2 31  --  34* 32 36*  BUN 17  --  14 10 6   CREATININE 0.73  --  0.63 0.62 0.68  CALCIUM 7.9*  --  8.3* 8.2* 8.2*  MG 2.3 2.2 2.3  --   --   PHOS 2.6 3.3 3.9  --   --   GLUCOSE 110*  --  119* 133* 101*    CBG (last 3)   Recent Labs  01/06/14 1139 01/06/14 1544 01/06/14 1905  GLUCAP 128* 122* 151*    Scheduled Meds: . antiseptic oral rinse  15 mL Mouth Rinse q12n4p  . cephALEXin  500 mg Oral Q12H  . cloNIDine  0.1 mg Transdermal Q Tue  . enoxaparin  (LOVENOX) injection  130 mg Subcutaneous Q12H  . multivitamin with minerals  1 tablet Oral Daily  . QUEtiapine  100 mg Oral QHS  . thiamine IV  100 mg Intravenous Daily    Continuous Infusions: . dextrose 5 % with KCl 20 mEq / L 20 mEq (01/08/14 0548)    Kendell BaneHeather Kambrea Carrasco RD, LDN, CNSC [phone] Pager [phone] After Hours Pager

## 2014-01-08 NOTE — Progress Notes (Signed)
Rehab admissions - [facility] and bed is available today. I spoke with Dr. Sung AmabileSimonds to receive medical clearance and per Dr. Sung AmabileSimonds, pt is cleared for CIR with Lovenox issue now addressed. I updated Sarah, case Production designer, theatre/television/filmmanager and pt's wife completed paperwork for admission.   Pt will be admitted to inpatient rehab later today. Please call me with any questions. Thanks.  Juliann MuleJanine Rei Paul, PT Rehabilitation Admissions Coordinator [phone]

## 2014-01-08 NOTE — Progress Notes (Signed)
ANTICOAGULATION CONSULT NOTE - Initial Consult  Pharmacy Consult for Lovenox Indication: DVT  No Known Allergies Patient Measurements: Height: 5\' 10"  (177.8 cm) Weight: 289 lb 7.4 oz (131.3 kg) IBW/kg (Calculated) : 73 Vital Signs: Temp: 98.2 F (36.8 C) (04/02 0700) Temp src: Oral (04/02 0700) BP: 136/55 mmHg (04/02 0451) Pulse Rate: 63 (04/02 0451) Labs:  Recent Labs  01/06/14 0530 01/08/14 0445  HGB 14.0  --   HCT 42.6  --   PLT 189  --   CREATININE 0.62 0.68   Estimated Creatinine Clearance: 118.7 ml/min (by C-G formula based on Cr of 0.68).   Medical History: Past Medical History  Diagnosis Date  . PVD (peripheral vascular disease)   . Tobacco chew use     for 30 years   Assessment: 89 YOM with new LLE DVT to start Lovenox per pharmacy dosing. Patient is obese with normal renal function. Note that patient had recent SAH & SDH s/p a fall but has been cleared by neuro to receive full dose Lovenox therapy. CBC has been stable. No bleeding noted.   Goal of Therapy:  Anti-Xa level 0.6-1 units/ml 4hrs after LMWH dose given  Monitor platelets by anticoagulation protocol: Yes   Plan:  Lovenox 130mg  (1 mg/kg) SQ every 12 hours.  Due to obesity and recent SAH + SDH, will check a level at steady state (4 hours after the third dose) to confirm at goal per CCM recommendations.   Link SnufferJessica Shelise Maron, PharmD, BCPS Clinical Pharmacist [phone] 01/08/2014,11:20 AM

## 2014-01-08 NOTE — Progress Notes (Signed)
Speech Language Pathology  Patient Details Name: Jewel BaizeWalter S Sprague MRN: [id number] DOB: 08/06/1946 Today's Date: 01/08/2014 Time:  -    Please note that cognitive-speech-language assessment charge from 12/27/13 has been removed as evaluation was inadvertently completed without physician order.  Breck CoonsLisa Willis New BostonLitaker M.Ed ITT IndustriesCCC-SLP Pager [phone]  01/08/2014

## 2014-01-08 NOTE — Progress Notes (Signed)
Patient arrived from 64M via wheelchair with RN and sitter. Patient alert and oriented to self. Sitter ordered for patient poor safety awareness and unsafe behaviors. Patient's spouse due to visit patient 01/09/14, will complete admission process at that time. Patient sitting comfortably in recliner with sitter in the room. Will continue to monitor.

## 2014-01-08 NOTE — H&P (Signed)
Physical Medicine and Rehabilitation Admission H&P      Chief Complaint   Patient presents with   .  Fall   .  Altered Mental Status    :   Chief complaint: Headache   HPI: Maurice Paul is a 68 y.o. right-handed male with history of tobacco abuse. Patient on no scheduled medications prior to admission. Admitted 12/24/2013 after a fall down approximately 8-10 steps and found to by his wife. No report of loss of consciousness. CT of the head showed subarachnoid hemorrhage over the high left parietal convexities and a small amount of subdural hemorrhage along the falx and tentorium without hydrocephalus or midline shift. CT cervical spine negative. Neurosurgery consulted Dr. Kristeen Miss with conservative care. Followup MRI of the brain 01/02/2014 showed persistent small volume of intracranial hemorrhage both subarachnoid and subdural no other acute abnormalities. Patient did require short intubation and was extubated 12/26/2013. EEG consistent with diffuse multifocal encephalopathy. Venous Doppler studies 01/07/2014 shows DVT left mid peroneal vein and Lovenox was initiated after being cleared by neurosurgery.. Currently maintained on a dysphagia 1 honey thick liquid diet. Klebsiella UTI treated with Keflex. Bouts of delirium and agitation with Seroquel each bedtime. Physical and occupational therapy evaluations completed 12/28/2013 at the recommendations of physical medicine rehabilitation consult. Patient was admitted for comprehensive rehabilitation program  Pt sitting in chair with sitter at his side , no c/os    ROS Review of Systems   Unable to perform ROS: mental acuity     Past Medical History   Diagnosis  Date   .  PVD (peripheral vascular disease)     .  Tobacco chew use         for 30 years    Past Surgical History   Procedure  Laterality  Date   .  Cyst removal trunk        History reviewed. No pertinent family history. Social History: reports that he quit smoking about  15 years ago. His smokeless tobacco use includes Chew. He reports that he drinks alcohol. His drug history is not on file. Allergies: No Known Allergies No prescriptions prior to admission      Home: Home Living Family/patient expects to be discharged to:: Private residence Living Arrangements: Spouse/significant other Available Help at Discharge: Family;Available 24 hours/day Type of Home: House Home Access: Stairs to enter [employer] of Steps: 4 Entrance Stairs-Rails: Left Home Layout: Two level;Able to live on main level with bedroom/bathroom Alternate Level Stairs-Number of Steps: flight Home Equipment: None Additional Comments: wife available for PLOF and home setup information    Lives With: Spouse    Functional History: Prior Function 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[employer])   Functional Status:   Mobility:   ADL: ADL Eating/Feeding: NPO Grooming: Maximal assistance Where Assessed - Grooming: Supported sitting Upper Body Bathing: Maximal assistance Where Assessed - Upper Body Bathing: Supported sitting Lower Body Bathing: +1 Total assistance Where Assessed - Lower Body Bathing: Supported sit to stand Upper Body Dressing: +1 Total assistance Where Assessed - Upper Body Dressing: Supported sitting Lower Body Dressing: +1 Total assistance Where Assessed - Lower Body Dressing: Supported sit to Lobbyist: +2 Total assistance;Maximal assistance Equipment Used: Gait belt Transfers/Ambulation Related to ADLs: +2 total A ADL Comments: able to complete hand to mouth pattern with mod vc to use R hand. Difficulty with accracy of placement of spoon. Washing face briefly with L hand   Cognition: Cognition Overall Cognitive Status: Impaired/Different from baseline Arousal/Alertness: Lethargic  Orientation Level: Disoriented X4 Attention: Sustained Sustained Attention: Impaired Sustained Attention Impairment: Verbal basic Memory:  (TBA) Awareness:  Impaired Awareness Impairment: Intellectual impairment;Emergent impairment;Anticipatory impairment Problem Solving: Impaired Problem Solving Impairment: Verbal basic Safety/Judgment: Impaired Rancho Los Amigos Scales of Cognitive Functioning: Confused/inappropriate/non-agitated (emerginh VI) Cognition Arousal/Alertness: Awake/alert Behavior During Therapy: Impulsive Overall Cognitive Status: Impaired/Different from baseline Area of Impairment: Orientation;Attention;Memory;Following commands;Safety/judgement;Awareness;Problem solving;Rancho level Orientation Level: Disoriented to;Place;Time;Situation Current Attention Level: Sustained (brief periods) Memory: Decreased recall of precautions;Decreased short-term memory Following Commands: Follows one step commands with increased time Safety/Judgement: Decreased awareness of safety;Decreased awareness of deficits Awareness: Intellectual Problem Solving: Slow processing;Decreased initiation;Difficulty sequencing;Requires verbal cues;Requires tactile cues General Comments: confabulating   Physical Exam: Blood pressure 143/64, pulse 62, temperature 98.8 F (37.1 C), temperature source Oral, resp. rate 20, height 5' 10"  (1.778 m), weight 135.6 kg (298 lb 15.1 oz), SpO2 97.00%. Physical Exam Vitals reviewed.   Eyes:  Pupils sluggish to light  pinpoint Neck: Normal range of motion. Neck supple. No thyromegaly present.   Cardiovascular: Normal rate and regular rhythm.   Respiratory:  Decreased breath sounds at the bases with poor inspiratory effort   GI: Soft. Bowel sounds are normal. He exhibits no distension.  Neurological:  Patient is lethargic but intermittently pulls at belt as well as parts of chair. He would state his name but would fall asleep during exam. He did not follow commands. Restraints were in place. 4/5 strength in bilateral UE and LEs                      RLAS V.  Psychiatric:  confused , oriented to person and [place]  but "[place]"   Gen morbidly obese, NADm Results for orders placed during the hospital encounter of 12/24/13 (from the past 48 hour(s))   GLUCOSE, CAPILLARY     Status: Abnormal     Collection Time      12/30/13  7:40 AM       Result  Value  Ref Range     Glucose-Capillary  120 (*)  70 - 99 mg/dL     Comment 1  Notify RN        Comment 2  Documented in Chart      GLUCOSE, CAPILLARY     Status: Abnormal     Collection Time      12/30/13 12:05 PM       Result  Value  Ref Range     Glucose-Capillary  139 (*)  70 - 99 mg/dL     Comment 1  Notify RN        Comment 2  Documented in Chart      GLUCOSE, CAPILLARY     Status: Abnormal     Collection Time      12/30/13  3:52 PM       Result  Value  Ref Range     Glucose-Capillary  142 (*)  70 - 99 mg/dL     Comment 1  Notify RN        Comment 2  Documented in Chart      GLUCOSE, CAPILLARY     Status: Abnormal     Collection Time      12/30/13  8:30 PM       Result  Value  Ref Range     Glucose-Capillary  133 (*)  70 - 99 mg/dL     Comment 1  Notify RN        Comment  2  Documented in Chart      GLUCOSE, CAPILLARY     Status: None     Collection Time      12/30/13 10:13 PM       Result  Value  Ref Range     Glucose-Capillary  92   70 - 99 mg/dL     Comment 1  Notify RN        Comment 2  Documented in Chart      GLUCOSE, CAPILLARY     Status: Abnormal     Collection Time      12/30/13 11:32 PM       Result  Value  Ref Range     Glucose-Capillary  125 (*)  70 - 99 mg/dL     Comment 1  Notify RN        Comment 2  Documented in Chart      GLUCOSE, CAPILLARY     Status: Abnormal     Collection Time      12/31/13  3:26 AM       Result  Value  Ref Range     Glucose-Capillary  138 (*)  70 - 99 mg/dL     Comment 1  Notify RN        Comment 2  Documented in Chart      CBC     Status: Abnormal     Collection Time      12/31/13  3:32 AM       Result  Value  Ref Range     WBC  10.4   4.0 - 10.5 K/uL     RBC  4.52   4.22 - 5.81 MIL/uL      Hemoglobin  15.4   13.0 - 17.0 g/dL     HCT  46.7   39.0 - 52.0 %     MCV  103.3 (*)  78.0 - 100.0 fL     MCH  34.1 (*)  26.0 - 34.0 pg     MCHC  33.0   30.0 - 36.0 g/dL     RDW  14.0   11.5 - 15.5 %     Platelets  202   150 - 400 K/uL   COMPREHENSIVE METABOLIC PANEL     Status: Abnormal     Collection Time      12/31/13  3:32 AM       Result  Value  Ref Range     Sodium  147   137 - 147 mEq/L     Potassium  3.8   3.7 - 5.3 mEq/L     Chloride  102   96 - 112 mEq/L     CO2  32   19 - 32 mEq/L     Glucose, Bld  130 (*)  70 - 99 mg/dL     BUN  15   6 - 23 mg/dL     Creatinine, Ser  0.73   0.50 - 1.35 mg/dL     Calcium  8.5   8.4 - 10.5 mg/dL     Total Protein  7.1   6.0 - 8.3 g/dL     Albumin  3.0 (*)  3.5 - 5.2 g/dL     AST  31   0 - 37 U/L     ALT  21   0 - 53 U/L     Alkaline Phosphatase  62   39 - 117 U/L  Total Bilirubin  1.0   0.3 - 1.2 mg/dL     GFR calc non Af Amer  >90   >90 mL/min     GFR calc Af Amer  >90   >90 mL/min     Comment:  (NOTE)        The eGFR has been calculated using the CKD EPI equation.        This calculation has not been validated in all clinical situations.        eGFR's persistently <90 mL/min signify possible Chronic Kidney        Disease.   GLUCOSE, CAPILLARY     Status: Abnormal     Collection Time      12/31/13  7:30 AM       Result  Value  Ref Range     Glucose-Capillary  121 (*)  70 - 99 mg/dL     Comment 1  Notify RN        Comment 2  Documented in Chart      GLUCOSE, CAPILLARY     Status: Abnormal     Collection Time      12/31/13 11:25 AM       Result  Value  Ref Range     Glucose-Capillary  131 (*)  70 - 99 mg/dL     Comment 1  Notify RN        Comment 2  Documented in Chart      GLUCOSE, CAPILLARY     Status: Abnormal     Collection Time      12/31/13  4:13 PM       Result  Value  Ref Range     Glucose-Capillary  114 (*)  70 - 99 mg/dL     Comment 1  Documented in Chart        Comment 2  Notify RN      GLUCOSE,  CAPILLARY     Status: Abnormal     Collection Time      12/31/13 10:01 PM       Result  Value  Ref Range     Glucose-Capillary  148 (*)  70 - 99 mg/dL     Comment 1  Documented in Chart        Comment 2  Notify RN      GLUCOSE, CAPILLARY     Status: Abnormal     Collection Time      01/01/14  6:44 AM       Result  Value  Ref Range     Glucose-Capillary  118 (*)  70 - 99 mg/dL     Comment 1  Documented in Chart        Comment 2  Notify RN       No results found.   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 Patient is admitted to receive collaborative, interdisciplinary care between the physiatrist, rehab nursing staff, and therapy team. Patient's level of medical complexity and substantial therapy needs in context of that medical necessity cannot be provided at a lesser intensity of care such as a SNF. Patient has experienced substantial functional loss from his/her baseline which was documented above under the "Functional History" and "Functional Status" headings.  Judging by the patient's diagnosis, physical exam, and functional history, the patient has potential for functional progress which will result in measurable gains while on inpatient rehab.  These gains will be of  substantial and practical use upon discharge  in facilitating mobility and self-care at the household level. Physiatrist will provide 24 hour management of medical needs as well as oversight of the therapy plan/treatment and provide guidance as appropriate regarding the interaction of the two. 24 hour rehab nursing will assist with bladder management, bowel management, safety, skin/wound care, disease management, medication administration, pain management and patient education  and help integrate therapy concepts, techniques,education, etc. PT will assess and treat for/with: pre gait, gait training, endurance , safety, equipment, neuromuscular re education.    Goals are: Supervision. OT will assess and treat for/with: ADLs, Cognitive perceptual skills, Neuromuscular re education, safety, endurance, equipment.   Goals are: Supervision. SLP will assess and treat for/with: memory attn, concentration, swallow, speech adn language.  Goals are: communicate basic needs mod I. Case Management and Social Worker will assess and treat for psychological issues and discharge planning. Team conference will be held weekly to assess progress toward goals and to determine barriers to discharge. Patient will receive at least 3 hours of therapy per day at least 5 days per week. ELOS: 14-21 days        Prognosis:  good     Medical Problem List and Plan: 1. left-sided SDH/SAH after a fall 2. DVT Prophylaxis/Anticoagulation:. Venous Doppler of lower extremities 01/07/2014 shows left DVT mid peroneal vein. Lovenox initiated 01/08/2014 after being cleared by neurosurgery. Consider followup vascular study in the next week to note any propagation of DVT 3. Pain Management: Tylenol as needed 4. Mood: Seroquel 100 mg each bedtime. Bed alarm  for safety 5. Neuropsych: This patient is not capable of making decisions on his own behalf. 6. Dysphagia. Dysphagia 1 honey liquids. Monitor hydration. Followup speech therapy 7. Hypertension. Clonidine patch 0-1 mg weekly. Monitor with increased mobility 8. Tobacco abuse. Counseling 9. Klebsiella UTI. Keflex as directed  Charlett Blake M.D. [facility] FAAPM&R (Sports Med, Neuromuscular Med) Diplomate Am Board of Electrodiagnostic Med  01/01/2014

## 2014-01-09 ENCOUNTER — Inpatient Hospital Stay (HOSPITAL_COMMUNITY): Payer: Medicare Other | Admitting: *Deleted

## 2014-01-09 ENCOUNTER — Inpatient Hospital Stay (HOSPITAL_COMMUNITY): Payer: Medicare Other | Admitting: Occupational Therapy

## 2014-01-09 ENCOUNTER — Inpatient Hospital Stay (HOSPITAL_COMMUNITY): Payer: Medicare Other | Admitting: Speech Pathology

## 2014-01-09 DIAGNOSIS — S065X0A Traumatic subdural hemorrhage without loss of consciousness, initial encounter: Secondary | ICD-10-CM

## 2014-01-09 DIAGNOSIS — S066X0A Traumatic subarachnoid hemorrhage without loss of consciousness, initial encounter: Secondary | ICD-10-CM

## 2014-01-09 DIAGNOSIS — R1310 Dysphagia, unspecified: Secondary | ICD-10-CM

## 2014-01-09 DIAGNOSIS — W108XXA Fall (on) (from) other stairs and steps, initial encounter: Secondary | ICD-10-CM

## 2014-01-09 LAB — CBC WITH DIFFERENTIAL/PLATELET
Basophils Absolute: 0 10*3/uL (ref 0.0–0.1)
Basophils Relative: 0 % (ref 0–1)
EOS ABS: 0.3 10*3/uL (ref 0.0–0.7)
EOS PCT: 4 % (ref 0–5)
HCT: 42.3 % (ref 39.0–52.0)
Hemoglobin: 13.8 g/dL (ref 13.0–17.0)
LYMPHS ABS: 2.9 10*3/uL (ref 0.7–4.0)
Lymphocytes Relative: 31 % (ref 12–46)
MCH: 33 pg (ref 26.0–34.0)
MCHC: 32.6 g/dL (ref 30.0–36.0)
MCV: 101.2 fL — AB (ref 78.0–100.0)
Monocytes Absolute: 1 10*3/uL (ref 0.1–1.0)
Monocytes Relative: 10 % (ref 3–12)
Neutro Abs: 5.1 10*3/uL (ref 1.7–7.7)
Neutrophils Relative %: 55 % (ref 43–77)
PLATELETS: 230 10*3/uL (ref 150–400)
RBC: 4.18 MIL/uL — ABNORMAL LOW (ref 4.22–5.81)
RDW: 13.5 % (ref 11.5–15.5)
WBC: 9.3 10*3/uL (ref 4.0–10.5)

## 2014-01-09 LAB — COMPREHENSIVE METABOLIC PANEL
ALT: 23 U/L (ref 0–53)
AST: 48 U/L — ABNORMAL HIGH (ref 0–37)
Albumin: 2.7 g/dL — ABNORMAL LOW (ref 3.5–5.2)
Alkaline Phosphatase: 66 U/L (ref 39–117)
BUN: 5 mg/dL — AB (ref 6–23)
CALCIUM: 8.7 mg/dL (ref 8.4–10.5)
CO2: 31 mEq/L (ref 19–32)
CREATININE: 0.62 mg/dL (ref 0.50–1.35)
Chloride: 101 mEq/L (ref 96–112)
GFR calc non Af Amer: >90 mL/min (ref >90)
GLUCOSE: 116 mg/dL — AB (ref 70–99)
Potassium: 4.7 mEq/L (ref 3.7–5.3)
Sodium: 144 mEq/L (ref 137–147)
Total Bilirubin: 0.7 mg/dL (ref 0.3–1.2)
Total Protein: 7 g/dL (ref 6.0–8.3)

## 2014-01-09 NOTE — IPOC Note (Addendum)
Overall Plan of Care [facility](IPOC) Patient Details Name: Jewel BaizeWalter S Santizo MRN: [id number] DOB: 06/06/1946  [facility]   Hospital Problems: Active Problems:   SDH (subdural hematoma)     Functional Problem List: Nursing Behavior;Bladder;Bowel;Edema;Endurance;Medication Management;Motor;Nutrition;Pain;Perception;Safety;Sensory;Skin Integrity  PT [employer]Balance;Behavior;Endurance;Motor;Pain;Perception;Safety;Sensory  OT Balance;Behavior;Cognition;Endurance;Motor;Safety;Vision  SLP Cognition;Nutrition  TR  activity tolerance, balance, safety, cognition       Basic ADL's: OT Grooming;Bathing;Dressing;Toileting     Advanced  ADL's: OT Laundry     Transfers: PT Bed Mobility;Bed to Chair;Car;Furniture  OT Toilet;Tub/Shower     Locomotion: PT Ambulation;Wheelchair Mobility;Stairs     Additional Impairments: OT None  SLP Swallowing;Social Cognition   Problem Solving;Memory;Attention;Awareness  TR      Anticipated Outcomes Item Anticipated Outcome  Self Feeding independent  Swallowing  Supervision   Basic self-care  supervision  Toileting  superivision   Bathroom Transfers supervision  Bowel/Bladder  Remain continent of bowel and bladder  Transfers  supervision  Locomotion  supervision ambulation  Communication     Cognition  Min  Pain  <3 on a scale of 0-10  Safety/Judgment  Min A   Therapy Plan: PT Intensity: Minimum of 1-2 x/day ,45 to 90 minutes PT Frequency: 5 out of 7 days PT Duration Estimated Length of Stay: 21-24 days OT Intensity: Minimum of 1-2 x/day, 45 to 90 minutes OT Frequency: 5 out of 7 days OT Duration/Estimated Length of Stay: 21-24 days SLP Intensity: Minumum of 1-2 x/day, 30 to 90 minutes SLP Frequency: 5 out of 7 days SLP Duration/Estimated Length of Stay: 14-21 days       Team Interventions: Nursing Interventions Patient/Family Education;Bladder Management;Bowel Management;Disease Management/Prevention;Pain  Management;Medication Management;Skin Care/Wound Management;Cognitive Remediation/Compensation;Dysphagia/Aspiration Precaution Training;Discharge Planning;Psychosocial Support  PT interventions Ambulation/gait training;Balance/vestibular training;Cognitive remediation/compensation;Community reintegration;Discharge planning;Neuromuscular re-education;Functional mobility training;DME/adaptive equipment instruction;Disease management/prevention;Pain management;Patient/family education;Psychosocial support;Skin care/wound management;UE/LE Coordination activities;UE/LE Strength taining/ROM;Therapeutic Exercise;Therapeutic Activities;Stair training;Visual/perceptual remediation/compensation;Wheelchair propulsion/positioning  OT Interventions Balance/vestibular training;Cognitive remediation/compensation;Community reintegration;Discharge planning;DME/adaptive equipment instruction;Functional mobility training;Patient/family education;Self Care/advanced ADL retraining;Therapeutic Activities;Visual/perceptual remediation/compensation  SLP Interventions Cognitive remediation/compensation;Environmental controls;Cueing hierarchy;Functional tasks;Therapeutic Activities;Internal/external aids;Dysphagia/aspiration precaution training;Patient/family education  TR Interventions  recreation/leisure participation, adaptive equipment use/education, balance/coordination, cognition, community reintegration, functional mobility, pt/family education, therapeutic activities, UE/LE strength/coordination, vision, discharge planning, psychosocial support  SW/CM Interventions      Team Discharge Planning: Destination: PT-Home ,OT- Home , SLP-Home Projected Follow-up: PT-Outpatient PT, OT-  Outpatient OT, SLP-Home Health SLP Projected Equipment Needs: PT-To be determined, OT- To be determined, SLP-None recommended by SLP Equipment Details: PT-Unsure if DME owned secondary to patient poor historian and no family member present;  further recommendations TBD upon d/c, OT-  Patient/family involved in discharge planning: PT- Patient unable/family or caregiver not available,  OT-Family member/caregiver;Patient, SLP-Patient unable/family or caregive not available;Family member/caregiver  MD ELOS: 14-21d Medical Rehab Prognosis:  Good Assessment: 68 y.o. right-handed male with history of tobacco abuse. Patient on no scheduled medications prior to admission. Admitted 12/24/2013 after a fall down approximately 8-10 steps and found to by his wife. No report of loss of consciousness. CT of the head showed subarachnoid hemorrhage over the high left parietal convexities and a small amount of subdural hemorrhage along the falx and tentorium without hydrocephalus or midline shift. CT cervical spine negative. Neurosurgery consulted Dr. Barnett AbuHenry Elsner with conservative care. Followup MRI of the brain 01/02/2014 showed persistent small volume of intracranial hemorrhage both subarachnoid and subdural no other acute abnormalities.  Now requiring 24/7 Rehab RN,MD, as well as CIR level PT, OT and SLP.  Treatment team will  focus on ADLs and mobility with goals set at sup/MinA    See Team Conference Notes for weekly updates to the plan of care

## 2014-01-09 NOTE — Progress Notes (Signed)
Subjective/Complaints: 68 y.o. right-handed male with history of tobacco abuse. Patient on no scheduled medications prior to admission. Admitted 12/24/2013 after a fall down approximately 8-10 steps and found to by his wife. No report of loss of consciousness. CT of the head showed subarachnoid hemorrhage over the high left parietal convexities and a small amount of subdural hemorrhage along the falx and tentorium without hydrocephalus or midline shift. CT cervical spine negative. Neurosurgery consulted Dr. Kristeen Miss with conservative care. Followup MRI of the brain 01/02/2014 showed persistent small volume of intracranial hemorrhage both subarachnoid and subdural no other acute abnormalities. Patient did require short intubation and was extubated 12/26/2013. EEG consistent with diffuse multifocal encephalopathy. Venous Doppler studies 01/07/2014 shows DVT left mid peroneal vein and Lovenox was initiated    Objective: Vital Signs: Blood pressure 115/67, pulse 60, temperature 97.8 F (36.6 C), temperature source Oral, resp. rate 18, height 5' 10"  (1.778 m), SpO2 90.00%. Dg Chest Port 1 View  01/08/2014   CLINICAL DATA:  Respiratory failure.  EXAM: PORTABLE CHEST - 1 VIEW  COMPARISON:  Single view of the chest 01/04/2014.  FINDINGS: ET tube and NG tube have been removed. Left IJ central venous catheter remains in place with the tip projecting over the left brachiocephalic vein. Its exact position cannot be determined on this single image. The chest is better expanded than on the prior study with decreased basilar atelectasis. No pneumothorax or pleural effusion.  IMPRESSION: Status post extubation and NG tube removal.  Improve subsegmental atelectasis in the lung bases.  No change in left IJ catheter. Exact position of the tip cannot be determined. It projects over the left brachiocephalic vein.   Electronically Signed   By: Inge Rise M.D.   On: 01/08/2014 07:25   Results for orders placed during  the hospital encounter of 01/08/14 (from the past 72 hour(s))  CBC WITH DIFFERENTIAL     Status: Abnormal   Collection Time    01/09/14  5:05 AM      Result Value Ref Range   WBC 9.3  4.0 - 10.5 K/uL   RBC 4.18 (*) 4.22 - 5.81 MIL/uL   Hemoglobin 13.8  13.0 - 17.0 g/dL   HCT 42.3  39.0 - 52.0 %   MCV 101.2 (*) 78.0 - 100.0 fL   MCH 33.0  26.0 - 34.0 pg   MCHC 32.6  30.0 - 36.0 g/dL   RDW 13.5  11.5 - 15.5 %   Platelets 230  150 - 400 K/uL   Neutrophils Relative % 55  43 - 77 %   Neutro Abs 5.1  1.7 - 7.7 K/uL   Lymphocytes Relative 31  12 - 46 %   Lymphs Abs 2.9  0.7 - 4.0 K/uL   Monocytes Relative 10  3 - 12 %   Monocytes Absolute 1.0  0.1 - 1.0 K/uL   Eosinophils Relative 4  0 - 5 %   Eosinophils Absolute 0.3  0.0 - 0.7 K/uL   Basophils Relative 0  0 - 1 %   Basophils Absolute 0.0  0.0 - 0.1 K/uL  COMPREHENSIVE METABOLIC PANEL     Status: Abnormal   Collection Time    01/09/14  5:05 AM      Result Value Ref Range   Sodium 144  137 - 147 mEq/L   Potassium 4.7  3.7 - 5.3 mEq/L   Comment: HEMOLYSIS AT THIS LEVEL MAY AFFECT RESULT   Chloride 101  96 - 112 mEq/L  CO2 31  19 - 32 mEq/L   Glucose, Bld 116 (*) 70 - 99 mg/dL   BUN 5 (*) 6 - 23 mg/dL   Creatinine, Ser 0.62  0.50 - 1.35 mg/dL   Calcium 8.7  8.4 - 10.5 mg/dL   Total Protein 7.0  6.0 - 8.3 g/dL   Albumin 2.7 (*) 3.5 - 5.2 g/dL   AST 48 (*) 0 - 37 U/L   Comment: HEMOLYSIS AT THIS LEVEL MAY AFFECT RESULT   ALT 23  0 - 53 U/L   Comment: HEMOLYSIS AT THIS LEVEL MAY AFFECT RESULT   Alkaline Phosphatase 66  39 - 117 U/L   Comment: HEMOLYSIS AT THIS LEVEL MAY AFFECT RESULT   Total Bilirubin 0.7  0.3 - 1.2 mg/dL   GFR calc non Af Amer >90  >90 mL/min   GFR calc Af Amer >90  >90 mL/min   Comment: (NOTE)     The eGFR has been calculated using the CKD EPI equation.     This calculation has not been validated in all clinical situations.     eGFR's persistently <90 mL/min signify possible Chronic Kidney     Disease.      HEENT: normal Cardio: RRR and no murmur Resp: CTA B/L and unlabored GI: BS positive and NT,ND Extremity:  No Edema Skin:   Intact Neuro: Confused, Cranial Nerve II-XII normal, Abnormal Motor 4/5 in BUE and BLE, Abnormal FMC Ataxic/ dec FMC and Other RLAS 5 Musc/Skel:  Normal Gen NAD   Assessment/Plan: 1. 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 Physiatrist is providing close team supervision and 24 hour management of active medical problems listed below. Physiatrist and rehab team continue to assess barriers to discharge/monitor patient progress toward functional and medical goals. FIM:       FIM - Toileting Toileting Assistive Devices: Grab bar or rail for support Toileting: 1: Total-Patient completed zero steps, helper did all 3           Comprehension Comprehension Mode: Auditory Comprehension: 2-Understands basic 25 - 49% of the time/requires cueing 51 - 75% of the time  Expression Expression Mode: Verbal Expression: 3-Expresses basic 50 - 74% of the time/requires cueing 25 - 50% of the time. Needs to repeat parts of sentences.  Social Interaction Social Interaction: 2-Interacts appropriately 25 - 49% of time - Needs frequent redirection.  Problem Solving Problem Solving: 2-Solves basic 25 - 49% of the time - needs direction more than half the time to initiate, plan or complete simple activities  Memory Memory: 2-Recognizes or recalls 25 - 49% of the time/requires cueing 51 - 75% of the time  Medical Problem List and Plan:  1. left-sided SDH/SAH after a fall  2. DVT Prophylaxis/Anticoagulation:. Venous Doppler of lower extremities 01/07/2014 shows left DVT mid peroneal vein. Lovenox initiated 01/08/2014 after being cleared by neurosurgery. Consider followup vascular study in the next week to note any propagation of DVT  3.  Pain Management: Tylenol as needed  4. Mood: Seroquel 100 mg each bedtime. Bed alarm for safety  5. Neuropsych: This patient is not capable of making decisions on his own behalf.  6. Dysphagia. Dysphagia 1 honey liquids. Monitor hydration. Followup speech therapy  7. Hypertension. Clonidine patch 0-1 mg weekly. Monitor with increased mobility  8. Tobacco abuse. Counseling  9. Klebsiella UTI. Keflex as directed   LOS (Days) 1 A FACE  TO FACE EVALUATION WAS PERFORMED  KIRSTEINS,ANDREW E 01/09/2014, 8:55 AM

## 2014-01-09 NOTE — Evaluation (Signed)
Speech Language Pathology Assessment and Plan  Patient Details  Name: Maurice Paul MRN: [id number] Date of Birth: 07-24-1946  SLP Diagnosis: Cognitive Impairments;Dysphagia  Rehab Potential: Good ELOS: 14-21 days   Today's Date: 01/09/2014 Time: 1300-1400 Time Calculation (min): 60 min  Problem List:  Patient Active Problem List   Diagnosis Date Noted  . SDH (subdural hematoma) 01/08/2014  . DVT (deep venous thrombosis) 01/07/2014  . HTN (hypertension) 12/30/2013  . Acute respiratory failure with hypoxia and hypercarbia 12/25/2013  . Metabolic encephalopathy 25/95/6387  . Morbid obesity 12/25/2013  . SAH (subarachnoid hemorrhage) 12/24/2013   Past Medical History:  Past Medical History  Diagnosis Date  . PVD (peripheral vascular disease)   . Tobacco chew use     for 30 years   Past Surgical History:  Past Surgical History  Procedure Laterality Date  . Cyst removal trunk      Assessment / Plan / Recommendation Clinical Impression Maurice Paul is a 68 y.o. right-handed male with history of tobacco abuse. Patient on no scheduled medications prior to admission. Admitted 12/24/2013 after a fall down approximately 8-10 steps and found to by his wife. No report of loss of consciousness. CT of the head showed subarachnoid hemorrhage over the high left parietal convexities and a small amount of subdural hemorrhage along the falx and tentorium without hydrocephalus or midline shift. CT cervical spine negative. Neurosurgery consulted Dr. Kristeen Miss with conservative care. Followup MRI of the brain 01/02/2014 showed persistent small volume of intracranial hemorrhage both subarachnoid and subdural no other acute abnormalities. Patient did require short intubation and was extubated 12/26/2013. EEG consistent with diffuse multifocal encephalopathy. Venous Doppler studies 01/07/2014 shows DVT left mid peroneal vein and Lovenox was initiated after being cleared by neurosurgery. Bouts of  delirium and agitation with Seroquel each bedtime. Patient admitted to CIR 01/08/2014 and demonstrates behaviors consistent with a Rancho level V characterized by severe cognitive impairments impacting sustained attention, intellectual awareness, memory, safety awareness, and problem solving which impacts the patient's overall safety with functional tasks. Patient's overall cognitive functional also impairs his verbal expression which is characterized by confabulation with language of confusion. Patient also demonstrates impaired swallow function characterized by what appears to be delayed swallow initiation with call consistencies with use of multiple swallows with thin liquids. Patient with intermittent cough at baseline which makes it difficult to differentiate if due to possible aspiration of PO trials. Patient is currently tolerating Dys. 1 textures and honey thick liquids via cup with full supervision for utilization of safe swallow strategies. Oral motor exam revealed decreased tongue elevation, depression, and left to right motions. Patient would benefit from skilled SLP intervention to maximize cognition and swallow function in order to maximize his functional independence prior to discharge home. Anticipate patient will require 24 hour supervision at home and f/u services.   Skilled Therapeutic Interventions          Patient was administered cognitive-linguistic evaluation and BSE.   SLP Assessment  Patient will need skilled Speech Lanaguage Pathology Services during CIR admission    Recommendations  Diet Recommendations: Dysphagia 1 (Puree);Honey-thick liquid Liquid Administration via: Cup;No straw Medication Administration: Crushed with puree Supervision: Full supervision/cueing for compensatory strategies;Patient able to self feed Compensations: Slow rate;Small sips/bites Postural Changes and/or Swallow Maneuvers: Seated upright 90 degrees Oral Care Recommendations: Oral care BID Patient  destination: Home Follow up Recommendations: Home Health SLP Equipment Recommended: None recommended by SLP    SLP Frequency 5 out of 7 days  SLP Treatment/Interventions Cognitive remediation/compensation;Environmental controls;Cueing hierarchy;Functional tasks;Therapeutic Activities;Internal/external aids;Dysphagia/aspiration precaution training;Patient/family education    Pain Pain Assessment Pain Assessment: No/denies pain Prior Functioning Cognitive/Linguistic Baseline: Within functional limits Type of Home: House  Lives With: Spouse Available Help at Discharge: Family;Available 24 hours/day Vocation: Retired  Industrial/product designer Term Goals: Week 1: SLP Short Term Goal 1 (Week 1): Patient will utlize safe swallow strategies to minimize overt s/s of aspiration with Min multimodal cues.  SLP Short Term Goal 2 (Week 1): Patient will demonstrate intellectual awareness with Max multimodal cues. SLP Short Term Goal 3 (Week 1): Patient will demonstrate selective attention for 10 minutes with Min multimodal cues.  SLP Short Term Goal 4 (Week 1): Patient will utilize external aids to assist in recall of new and daily information with Max multimodal cues.  SLP Short Term Goal 5 (Week 1): Patient will complete basic problem solving tasks with Max multimodal cues  See FIM for current functional status Refer to Care Plan for Long Term Goals  Recommendations for other services: None  Discharge Criteria: Patient will be discharged from SLP if patient refuses treatment 3 consecutive times without medical reason, if treatment goals not met, if there is a change in medical status, if patient makes no progress towards goals or if patient is discharged from hospital.  The above assessment, treatment plan, treatment alternatives and goals were discussed and mutually agreed upon: by family  Sagrario Lineberry 01/09/2014, 3:41 PM

## 2014-01-09 NOTE — Evaluation (Signed)
Occupational Therapy Assessment and Plan  Patient Details  Name: Maurice Paul MRN: [id number] Date of Birth: 25-May-1946  OT Diagnosis: altered mental status, cognitive deficits, disturbance of vision and muscle weakness (generalized) Rehab Potential: Rehab Potential: Excellent ELOS: 21-24 days   Today's Date: 01/09/2014 Time:  - 800-900    Problem List:  Patient Active Problem List   Diagnosis Date Noted  . SDH (subdural hematoma) 01/08/2014  . DVT (deep venous thrombosis) 01/07/2014  . HTN (hypertension) 12/30/2013  . Acute respiratory failure with hypoxia and hypercarbia 12/25/2013  . Metabolic encephalopathy 48/54/6270  . Morbid obesity 12/25/2013  . SAH (subarachnoid hemorrhage) 12/24/2013    Past Medical History:  Past Medical History  Diagnosis Date  . PVD (peripheral vascular disease)   . Tobacco chew use     for 30 years   Past Surgical History:  Past Surgical History  Procedure Laterality Date  . Cyst removal trunk      Assessment & Plan Clinical Impression: Maurice Paul is a 68 y.o. right-handed male with history of tobacco abuse. Patient on no scheduled medications prior to admission. Admitted 12/24/2013 after a fall down approximately 8-10 steps and found to by his wife. No report of loss of consciousness. CT of the head showed subarachnoid hemorrhage over the high left parietal convexities and a small amount of subdural hemorrhage along the falx and tentorium without hydrocephalus or midline shift. CT cervical spine negative. Neurosurgery consulted Maurice Paul with conservative care. Followup MRI of the brain 01/02/2014 showed persistent small volume of intracranial hemorrhage both subarachnoid and subdural no other acute abnormalities. Patient did require short intubation and was extubated 12/26/2013. EEG consistent with diffuse multifocal encephalopathy. Venous Doppler studies 01/07/2014 shows DVT left mid peroneal vein and Maurice Paul was initiated after being  cleared by neurosurgery.. Currently maintained on a dysphagia 1 honey thick liquid diet. Klebsiella UTI treated with Maurice Paul. Bouts of delirium and agitation with Maurice Paul each bedtime. Physical and occupational therapy evaluations completed 12/28/2013 at the recommendations of physical medicine rehabilitation consult  Patient transferred to CIR on 01/08/2014 .    Patient currently requires max with basic self-care skills secondary to muscle weakness, question diplopia currently, decreased initiation, decreased attention, decreased awareness, decreased problem solving, decreased safety awareness, decreased memory and delayed processing and decreased sitting balance, decreased standing balance and decreased balance strategies.  Prior to hospitalization, patient could complete adls with independent .(required min (A) for LB)   Patient will benefit from skilled intervention to increase independence with basic self-care skills prior to discharge home with care partner.  Anticipate patient will require 24 hour supervision and minimal physical assistance and follow up outpatient.  OT - End of Session Activity Tolerance: Tolerates 10 - 20 min activity with multiple rests Endurance Deficit: Yes Endurance Deficit Description: fatigues quickly OT Assessment Rehab Potential: Excellent OT Patient demonstrates impairments in the following area(s): Balance;Behavior;Cognition;Endurance;Motor;Safety;Vision OT Basic ADL's Functional Problem(s): Grooming;Bathing;Dressing;Toileting OT Advanced ADL's Functional Problem(s): Laundry OT Transfers Functional Problem(s): Toilet;Tub/Shower OT Additional Impairment(s): None OT Plan OT Intensity: Minimum of 1-2 x/day, 45 to 90 minutes OT Frequency: 5 out of 7 days OT Duration/Estimated Length of Stay: 21-24 days OT Treatment/Interventions: Balance/vestibular training;Cognitive remediation/compensation;Community reintegration;Discharge planning;DME/adaptive equipment  instruction;Functional mobility training;Patient/family education;Self Care/advanced ADL retraining;Therapeutic Activities;Visual/perceptual remediation/compensation OT Self Feeding Anticipated Outcome(s): independent OT Basic Self-Care Anticipated Outcome(s): supervision OT Toileting Anticipated Outcome(s): superivision OT Bathroom Transfers Anticipated Outcome(s): supervision OT Recommendation Patient destination: Home Follow Up Recommendations: Outpatient OT Equipment Recommended: To be determined  Skilled Therapeutic Intervention Evaluation complete and POC established. Pt completed sink level bathing with poor attention to task demonstrating language of confusion and confabulation. Pt demonstrates some left inattention and lateral left lean with poor trunk control initially. Pt demonstrates inability to don /doff socks without min (A) and extensive effort. Pt fatigues quickly and presents at Maurice Paul Coma recovery level V. Pt dislikes gait belt in chair as safety and verbalizes this. Pt educated on the risk of falling and need for assistance. Pt static standing at sink for peri care and unable to reach buttock. Pt demonstrates weakness in Rt LE with stand pivot from chair to w/c. Pt with difficulty completing turn.   Second session: 08-1149 ( 10 minutes missed due to patient liable)- Pt agreeable to therapy session. Pt transferred with rw from w/c to eob mod (A). Pt completing sit<>supine x2 with min (A). Pt able to push into sitting with min guard (A). Pt transferring eob to toilet total+2 due to LOB to the right x2. Pt completing toilet transfer mod (A) with uncontrolled descend. Pt ambulating with RW back to w/c with mod (A) and max (A) for stand pivot Lt. Pt uncontrolled descend to w/c. Pt very fatigued with task. Pt provided cognitive remediation in room and attempting card game. Pt provided 5 cards and OT asking questioning cues requiring visual scanning cards for the requested item. Pt  needing max cueing and unable to self correct errors. Pt with wife arriving and x2 oldest children. Pt became very liable. Pt able to verbalize who all 3 individuals were by name and one by nick name. Wife educated on the purpose of OT services and current goals. Session ending with pt in w/c with gait belt. Sitter present  OT Evaluation Precautions/Restrictions  Precautions Precautions: Fall Precaution Comments: gait belt for safety General   Vital Signs   Pain Pain Assessment Pain Assessment: No/denies pain Home Living/Prior Functioning Home Living Available Help at Discharge: Family;Available 24 hours/day Type of Home: House Home Access: Stairs to enter [employer] of Steps: 4 Entrance Stairs-Rails: Left Home Layout: Two level;Able to live on main level with bedroom/bathroom Alternate Level Stairs-Number of Steps: flight Additional Comments: all information obtained from chart secondary to patient poor historian and no family member present  Lives With: Spouse IADL History Current License: Yes Occupation: Retired Type of Occupation: worked for [employer] 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 Question visual deficits due to occluding one eye to read clock Perception Perception: Impaired Inattention/Neglect: Does not attend to left visual field Praxis Praxis: Impaired Praxis Impairment Details: Perseveration;Motor planning;Initiation  Cognition Overall Cognitive Status: Impaired/Different from baseline Arousal/Alertness:  Awake/alert Orientation Level: Oriented to person;Disoriented to place;Disoriented to time;Disoriented to situation Attention: Sustained Sustained Attention: Impaired Selective Attention: Impaired Selective Attention Impairment: Verbal basic;Functional basic Memory: Impaired Memory Impairment: Decreased short term memory;Decreased recall of new information Decreased Short Term Memory: Verbal basic;Functional basic Awareness: Impaired Awareness Impairment: Intellectual impairment Problem Solving: Impaired Problem Solving Impairment: Verbal basic;Functional basic Executive Function: Reasoning;Sequencing;Organizing;Decision Making;Self Monitoring;Self Correcting Reasoning: Impaired Reasoning Impairment: Verbal basic;Functional basic Sequencing: Impaired Sequencing Impairment: Verbal basic;Functional basic Organizing: Impaired Organizing Impairment: Verbal basic;Functional basic Decision Making: Impaired Decision  Making Impairment: Verbal basic;Functional basic Self Monitoring: Impaired Self Monitoring Impairment: Verbal basic;Functional basic Self Correcting: Impaired Self Correcting Impairment: Verbal basic;Functional basic Behaviors: Restless;Confabulation Safety/Judgment: Impaired [employer] Scales of Cognitive Functioning: Confused/inappropriate/non-agitated Sensation Sensation Light Touch: Appears Intact Hot/Cold: Appears Intact Proprioception: Appears Intact Coordination Gross Motor Movements are Fluid and Coordinated: Yes Fine Motor Movements are Fluid and Coordinated: Yes Motor  Motor Motor: Abnormal postural alignment and control Mobility  Bed Mobility Bed Mobility: Supine to Sit;Sit to Supine Supine to Sit: 4: Min guard;HOB flat Sit to Supine: 4: Min assist;HOB flat Transfers Sit to Stand: 3: Mod assist;With upper extremity assist;From chair/3-in-1 Stand to Sit: 3: Mod assist;With upper extremity assist;To chair/3-in-1  Trunk/Postural Assessment   Cervical Assessment Cervical Assessment: Within Functional Limits Thoracic Assessment Thoracic Assessment: Within Functional Limits Lumbar Assessment Lumbar Assessment: Within Functional Limits Postural Control Postural Control: Deficits on evaluation Righting Reactions: delayed Protective Responses: absent Postural Limitations: Pt with leaning to the left in chair on arrival. Pt able to correct with verbal cues. Pt with LOB to the Rt x3 during session  Balance Balance Balance Assessed: Yes Static Sitting Balance Static Sitting - Balance Support: Feet supported;Bilateral upper extremity supported (in wheelchair) Static Sitting - Level of Assistance: 5: Stand by assistance Static Standing Balance Static Standing - Balance Support: Bilateral upper extremity supported;During functional activity Static Standing - Level of Assistance: 1: +2 Total assist Extremity/Trunk Assessment RUE Assessment RUE Assessment: Exceptions to WFL (generalized weaknesss) LUE Assessment LUE Assessment: Exceptions to [facility] (generalized weakness)  FIM:  FIM - Grooming Grooming Steps: Wash, rinse, dry face;Wash, rinse, dry hands;Brush, comb hair Grooming: 4: Patient completes 3 of 4 or 4 of 5 steps FIM - Bathing Bathing Steps Patient Completed: Chest;Right Arm;Left Arm;Abdomen;Front perineal area;Right upper leg;Left upper leg Bathing: 3: Mod-Patient completes 5-7 [id number] parts or 50-74% FIM - Lower Body Dressing/Undressing Lower body dressing/undressing steps patient completed: Don/Doff right shoe;Don/Doff left shoe Lower body dressing/undressing: 2: Max-Patient completed 25-49% of tasks FIM - TRadio producerDevices: WInsurance account managerTransfers: 2-To toilet/BSC: Max A (lift and lower assist);3-From toilet/BSC: Mod A (lift or lower assist) (LOB to the RT with static standing )   Refer to Care Plan for Long Term Goals   Recommendations for other services: None  Discharge  Criteria: Patient will be discharged from OT if patient refuses treatment 3 consecutive times without medical reason, if treatment goals not met, if there is a change in medical status, if patient makes no progress towards goals or if patient is discharged from hospital.  The above assessment, treatment plan, treatment alternatives and goals were discussed and mutually agreed upon: by patient and by family  JPeri Maris4/12/2013, 3:31 PM  Pager: [phone]

## 2014-01-09 NOTE — Evaluation (Signed)
The skilled treatment note has been reviewed and SLP is in agreement.  Vasilios Ottaway, M.A., CCC-SLP  [phone]   

## 2014-01-09 NOTE — Progress Notes (Signed)
Physical Therapy Session Note  Patient Details  Name: Maurice Paul MRN: [id number] Date of Birth: 06/06/1946  Today's Date: 01/09/2014 Time: 1500-1530 Time Calculation (min): 30 min  Short Term Goals: Week 1:  PT Short Term Goal 1 (Week 1): Patient will perform bed mobility with modA. PT Short Term Goal 2 (Week 1): Patient will perform functional transfers with modA. PT Short Term Goal 3 (Week 1): Patient will ambulate 150' with modA. PT Short Term Goal 4 (Week 1): Patient will negotiate 5 steps with 2 handrails and modA. PT Short Term Goal 5 (Week 1): Patient will follow basic, one step commands in automatic functional task 75% of opportunities with mod cues.  Skilled Therapeutic Interventions/Progress Updates:    Patient received sitting in wheelchair with reports of needing to use bathroom. Session focused on initiation, sustained attention, and command following during basic functional mobility and toileting. Patient transferred wheelchair<>toilet with modA for continent void of urine, requires assist for clothing management. Patient performed functional ambulation x90' with +2 assist via 3 musketeer style (B UEs around helper's shoulders). Patient transferred wheelchair>bed with modA, sit>supine with minA. Patient left supine in bed with 3 rails up and bed alarm on, sitter and 2 children present as well as wife.  Discussion with patient's wife, daughter, and son about brain injury education, falls risk, safety within room, and focus of therapy during stay. Discussed possible LOS, goals, and f/u therapy.  Therapy Documentation Precautions:  Precautions Precautions: Fall Precaution Comments: gait belt for safety Restrictions Weight Bearing Restrictions: No Pain: Pain Assessment Pain Assessment: No/denies pain Pain Score: 0-No pain Locomotion : Ambulation Ambulation/Gait Assistance: 1: +2 Total assist   See FIM for current functional status  Therapy/Group: Individual  Therapy  Chipper HerbBridget S Demitri Kucinski S. Diem Dicocco, PT, DPT 01/09/2014, 4:06 PM

## 2014-01-09 NOTE — Evaluation (Signed)
Physical Therapy Assessment and Plan  Patient Details  Name: Maurice Paul MRN: [id number] Date of Birth: 08-31-1946  PT Diagnosis: Abnormality of gait, Cognitive deficits, Coordination disorder, Impaired cognition and Muscle weakness Rehab Potential:   Excellent ELOS:   21-24 days  Today's Date: 01/09/2014 Time: 0017-4944 Time Calculation (min): 60 min  Problem List:  Patient Active Problem List   Diagnosis Date Noted  . SDH (subdural hematoma) 01/08/2014  . DVT (deep venous thrombosis) 01/07/2014  . HTN (hypertension) 12/30/2013  . Acute respiratory failure with hypoxia and hypercarbia 12/25/2013  . Metabolic encephalopathy 96/75/9163  . Morbid obesity 12/25/2013  . SAH (subarachnoid hemorrhage) 12/24/2013    Past Medical History:  Past Medical History  Diagnosis Date  . PVD (peripheral vascular disease)   . Tobacco chew use     for 30 years   Past Surgical History:  Past Surgical History  Procedure Laterality Date  . Cyst removal trunk      Assessment & Plan Clinical Impression: Maurice Paul is a 68 y.o. right-handed male with history of tobacco abuse. Patient on no scheduled medications prior to admission. Admitted 12/24/2013 after a fall down approximately 8-10 steps and found to by his wife. No report of loss of consciousness. CT of the head showed subarachnoid hemorrhage over the high left parietal convexities and a small amount of subdural hemorrhage along the falx and tentorium without hydrocephalus or midline shift. CT cervical spine negative. Neurosurgery consulted Dr. Kristeen Miss with conservative care. Followup MRI of the brain 01/02/2014 showed persistent small volume of intracranial hemorrhage both subarachnoid and subdural no other acute abnormalities. Patient did require short intubation and was extubated 12/26/2013. EEG consistent with diffuse multifocal encephalopathy. Venous Doppler studies 01/07/2014 shows DVT left mid peroneal vein and Lovenox was initiated  after being cleared by neurosurgery.. Currently maintained on a dysphagia 1 honey thick liquid diet. Klebsiella UTI treated with Keflex. Bouts of delirium and agitation with Seroquel each bedtime. Physical and occupational therapy evaluations completed 12/28/2013 at the recommendations of physical medicine rehabilitation consult. Patient was admitted for comprehensive rehabilitation program. Patient transferred to CIR on 01/08/2014 .   Patient currently requires total with mobility secondary to muscle weakness, decreased cardiorespiratoy endurance, impaired timing and sequencing, unbalanced muscle activation, decreased coordination and decreased motor planning, visual impairments not yet assessed, decreased attention to left and decreased motor planning, decreased initiation, decreased attention, decreased awareness, decreased problem solving, decreased safety awareness and decreased memory, na and decreased sitting balance, decreased standing balance, decreased postural control, decreased balance strategies and difficulty maintaining precautions.  Prior to hospitalization, patient was independent  with mobility and lived with Spouse in a House home.  Home access is 4Stairs to enter.  Patient will benefit from skilled PT intervention to maximize safe functional mobility, minimize fall risk and decrease caregiver burden for planned discharge home with 24 hour supervision.  Anticipate patient will benefit from follow up OP at discharge.  PT - End of Session Activity Tolerance: Tolerates 30+ min activity with multiple rests Endurance Deficit: Yes Endurance Deficit Description: fatigues quickly PT Assessment Rehab Potential: Excellent PT Patient demonstrates impairments in the following area(s): Balance;Behavior;Endurance;Motor;Pain;Perception;Safety;Sensory PT Transfers Functional Problem(s): Bed Mobility;Bed to Chair;Car;Furniture PT Locomotion Functional Problem(s): Ambulation;Wheelchair  Mobility;Stairs PT Plan PT Intensity: Minimum of 1-2 x/day ,45 to 90 minutes PT Frequency: 5 out of 7 days PT Duration Estimated Length of Stay: 21-24 days PT Treatment/Interventions: Ambulation/gait training;Balance/vestibular training;Cognitive remediation/compensation;Community reintegration;Discharge planning;Neuromuscular re-education;Functional mobility training;DME/adaptive equipment instruction;Disease management/prevention;Pain management;Patient/family education;Psychosocial support;Skin care/wound  management;UE/LE Coordination activities;UE/LE Strength taining/ROM;Therapeutic Exercise;Therapeutic Activities;Stair training;Visual/perceptual remediation/compensation;Wheelchair propulsion/positioning PT Transfers Anticipated Outcome(s): supervision PT Locomotion Anticipated Outcome(s): supervision ambulation PT Recommendation Recommendations for Other Services: Speech consult Follow Up Recommendations: Outpatient PT Patient destination: Home Equipment Recommended: To be determined Equipment Details: Unsure if DME owned secondary to patient poor historian and no family member present; further recommendations TBD upon d/c  Skilled Therapeutic Intervention Skilled therapeutic intervention initiated after completion of evaluation. Patient left sitting in recliner with B LEs elevated and sitter present.  PT Evaluation Precautions/Restrictions Precautions Precautions: Fall Restrictions Weight Bearing Restrictions: No General Chart Reviewed: Yes Family/Caregiver Present: No   Pain Pain Assessment Pain Assessment: No/denies pain Pain Score: 0-No pain Home Living/Prior Functioning Home Living Available Help at Discharge: Family;Available 24 hours/day Type of Home: House Home Access: Stairs to enter [employer] of Steps: 4 Entrance Stairs-Rails: Left Home Layout: Two level;Able to live on main level with bedroom/bathroom Alternate Level Stairs-Number of Steps:  flight Additional Comments: all information obtained from chart secondary to patient poor historian and no family member present  Lives With: Spouse Prior Function Level of Independence: Independent with basic ADLs;Independent with gait;Independent with transfers (all information obtained from chart as patient poor historian and no family member present)  Able to Take Stairs?: Yes Driving: Yes 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 Question visual deficits due to occluding one eye to read clock Perception Perception: Impaired Inattention/Neglect: Does not attend to left visual field Praxis Praxis: Impaired Praxis Impairment Details: Perseveration;Motor planning;Initiation  Cognition Overall Cognitive Status: Impaired/Different from baseline Arousal/Alertness: Awake/alert Orientation Level: Oriented to person;Disoriented to place;Disoriented to time;Disoriented to situation Attention: Sustained Sustained Attention: Impaired Sustained Attention Impairment: Verbal basic;Functional basic Selective Attention: Impaired Selective Attention Impairment: Verbal basic;Functional basic Memory: Impaired Memory Impairment: Decreased short term memory;Decreased recall of new information Decreased Short Term Memory: Verbal basic;Functional basic Awareness: Impaired Awareness Impairment: Intellectual impairment Problem Solving: Impaired Problem Solving Impairment: Verbal basic;Functional basic Executive Function: Reasoning;Sequencing;Organizing;Decision Making;Self Monitoring;Self Correcting Reasoning: Impaired Reasoning Impairment: Verbal  basic;Functional basic Sequencing: Impaired Sequencing Impairment: Verbal basic;Functional basic Organizing: Impaired Organizing Impairment: Verbal basic;Functional basic Decision Making: Impaired Decision Making Impairment: Verbal basic;Functional basic Self Monitoring: Impaired Self Monitoring Impairment: Verbal basic;Functional basic Self Correcting: Impaired Self Correcting Impairment: Verbal basic;Functional basic Behaviors: Restless;Confabulation Safety/Judgment: Impaired [employer] Scales of Cognitive Functioning: Confused/inappropriate/non-agitated Sensation Sensation Light Touch: Appears Intact Proprioception: Appears Intact Coordination Gross Motor Movements are Fluid and Coordinated: Yes Fine Motor Movements are Fluid and Coordinated: Yes Motor  Motor Motor: Abnormal postural alignment and control  Mobility Bed Mobility Bed Mobility: Not assessed Transfers Transfers: Yes Sit to Stand: 3: Mod assist;1: +2 Total assist;With armrests;From chair/3-in-1;With upper extremity assist Sit to Stand Details: Verbal cues for precautions/safety;Verbal cues for technique;Manual facilitation for weight shifting;Manual facilitation for placement;Tactile cues for initiation Stand to Sit: 1: +2 Total assist;3: Mod assist;With upper extremity assist;With armrests;To chair/3-in-1 Stand to Sit Details (indicate cue type and reason): Manual facilitation for weight shifting;Verbal cues for precautions/safety;Verbal cues for technique;Manual facilitation for placement Stand Pivot Transfers: 3: Mod assist;1: +2 Total assist Stand Pivot Transfer Details: Verbal cues for technique;Verbal cues for precautions/safety;Manual facilitation for placement;Manual facilitation for weight shifting Locomotion  Ambulation Ambulation: Yes Ambulation/Gait  Assistance: 1: +2 Total assist Ambulation Distance (Feet): 90 Feet Assistive device: 2 person hand held assist Ambulation/Gait Assistance  Details: Manual facilitation for weight shifting;Verbal cues for gait pattern;Verbal cues for precautions/safety;Tactile cues for posture Gait Gait: Yes Gait Pattern: Impaired Gait Pattern: Step-through pattern;Decreased stride length;Decreased step length - right;Decreased step length - left;Lateral trunk lean to left;Wide base of support;Trunk flexed Stairs / Additional Locomotion Stairs: Yes Stairs Assistance: 1: +2 Total assist Stairs Assistance Details: Verbal cues for precautions/safety;Manual facilitation for weight shifting;Manual facilitation for placement;Verbal cues for gait pattern;Verbal cues for technique;Verbal cues for sequencing;Tactile cues for sequencing Stairs Assistance Details (indicate cue type and reason): ascends forwards, descends backwards Stair Management Technique: Two rails;Step to pattern;Backwards;Forwards Number of Stairs: 3 Height of Stairs: 5 Wheelchair Mobility Wheelchair Mobility: Yes Wheelchair Assistance: 2: Max Technical sales engineer Details: Verbal cues for precautions/safety;Verbal cues for sequencing;Verbal cues for technique;Tactile cues for sequencing;Tactile cues for initiation;Tactile cues for placement Wheelchair Propulsion: Both upper extremities Wheelchair Parts Management: Needs assistance Distance: 15  Trunk/Postural Assessment  Cervical Assessment Cervical Assessment: Within Functional Limits Thoracic Assessment Thoracic Assessment: Within Functional Limits Lumbar Assessment Lumbar Assessment: Within Functional Limits Postural Control Postural Control: Deficits on evaluation Righting Reactions: delayed Protective Responses: absent Postural Limitations: L lateral trunk in standing  Balance Balance Balance Assessed: Yes Static Sitting Balance Static Sitting - Balance Support: Feet supported;Bilateral upper extremity supported (in wheelchair) Static Sitting - Level of Assistance: 5: Stand by assistance Static Standing  Balance Static Standing - Balance Support: Bilateral upper extremity supported;During functional activity Static Standing - Level of Assistance: 1: +2 Total assist Extremity Assessment  RLE Assessment RLE Assessment: Within Functional Limits (Grossly 3/5 functionally) LLE Assessment LLE Assessment: Within Functional Limits (Grossly 3/5 functionally)  FIM:  FIM - Control and instrumentation engineer Devices: Arm rests Bed/Chair Transfer: 1: Two helpers FIM - Locomotion: Wheelchair Distance: 15 Locomotion: Wheelchair: 1: Travels less than 50 ft with maximal assistance (Pt: 25 - 49%) FIM - Locomotion: Ambulation Ambulation/Gait Assistance: 1: +2 Total assist Locomotion: Ambulation: 1: Two helpers FIM - Locomotion: Stairs Locomotion: Scientist, physiological: Insurance account manager - 2 Locomotion: Stairs: 1: Two helpers   Refer to [employer] for Long Term Goals  Recommendations for other services: None  Discharge Criteria: Patient will be discharged from PT if patient refuses treatment 3 consecutive times without medical reason, if treatment goals not met, if there is a change in medical status, if patient makes no progress towards goals or if patient is discharged from hospital.  The above assessment, treatment plan, treatment alternatives and goals were discussed and mutually agreed upon: No family available/patient unable  [place]. Tariah Transue, PT, DPT 01/09/2014, 4:03 PM

## 2014-01-10 ENCOUNTER — Inpatient Hospital Stay (HOSPITAL_COMMUNITY): Payer: Medicare Other | Admitting: Speech Pathology

## 2014-01-10 ENCOUNTER — Inpatient Hospital Stay (HOSPITAL_COMMUNITY): Payer: Medicare Other | Admitting: *Deleted

## 2014-01-10 ENCOUNTER — Inpatient Hospital Stay (HOSPITAL_COMMUNITY): Payer: Medicare Other

## 2014-01-10 MED ORDER — IBUPROFEN 400 MG PO TABS
400.0000 mg | ORAL_TABLET | ORAL | Status: DC | PRN
  Administered 2014-01-10 – 2014-01-21 (×2): 400 mg via ORAL
  Filled 2014-01-10 (×3): qty 1

## 2014-01-10 NOTE — Progress Notes (Signed)
Subjective/Complaints: 68 y.o. right-handed male with history of tobacco abuse. Patient on no scheduled medications prior to admission. Admitted 12/24/2013 after a fall down approximately 8-10 steps and found to by his wife. No report of loss of consciousness. CT of the head showed subarachnoid hemorrhage over the high left parietal convexities and a small amount of subdural hemorrhage along the falx and tentorium without hydrocephalus or midline shift. CT cervical spine negative. Neurosurgery consulted Dr. Kristeen Miss with conservative care. Followup MRI of the brain 01/02/2014 showed persistent small volume of intracranial hemorrhage both subarachnoid and subdural no other acute abnormalities. Patient did require short intubation and was extubated 12/26/2013. EEG consistent with diffuse multifocal encephalopathy. Venous Doppler studies 01/07/2014 shows DVT left mid peroneal vein and Lovenox was initiated   Pt drowsy, awakens to voice  Objective: Vital Signs: Blood pressure 118/54, pulse 74, temperature 97.5 F (36.4 C), temperature source Axillary, resp. rate 18, height _0  (1.778 m), SpO2 90.00%. No results found. Results for orders placed during the hospital encounter of 01/08/14 (from the past 72 hour(s))  CBC WITH DIFFERENTIAL     Status: Abnormal   Collection Time    01/09/14  5:05 AM      Result Value Ref Range   WBC 9.3  4.0 - 10.5 K/uL   RBC 4.18 (*) 4.22 - 5.81 MIL/uL   Hemoglobin 13.8  13.0 - 17.0 g/dL   HCT 42.3  39.0 - 52.0 %   MCV 101.2 (*) 78.0 - 100.0 fL   MCH 33.0  26.0 - 34.0 pg   MCHC 32.6  30.0 - 36.0 g/dL   RDW 13.5  11.5 - 15.5 %   Platelets 230  150 - 400 K/uL   Neutrophils Relative % 55  43 - 77 %   Neutro Abs 5.1  1.7 - 7.7 K/uL   Lymphocytes Relative 31  12 - 46 %   Lymphs Abs 2.9  0.7 - 4.0 K/uL   Monocytes Relative 10  3 - 12 %   Monocytes Absolute 1.0  0.1 - 1.0 K/uL   Eosinophils Relative 4  0 - 5 %   Eosinophils Absolute 0.3  0.0 - 0.7 K/uL   Basophils Relative 0  0 - 1 %   Basophils Absolute 0.0  0.0 - 0.1 K/uL  COMPREHENSIVE METABOLIC PANEL     Status: Abnormal   Collection Time    01/09/14  5:05 AM      Result Value Ref Range   Sodium 144  137 - 147 mEq/L   Potassium 4.7  3.7 - 5.3 mEq/L   Comment: HEMOLYSIS AT THIS LEVEL MAY AFFECT RESULT   Chloride 101  96 - 112 mEq/L   CO2 31  19 - 32 mEq/L   Glucose, Bld 116 (*) 70 - 99 mg/dL   BUN 5 (*) 6 - 23 mg/dL   Creatinine, Ser 0.62  0.50 - 1.35 mg/dL   Calcium 8.7  8.4 - 10.5 mg/dL   Total Protein 7.0  6.0 - 8.3 g/dL   Albumin 2.7 (*) 3.5 - 5.2 g/dL   AST 48 (*) 0 - 37 U/L   Comment: HEMOLYSIS AT THIS LEVEL MAY AFFECT RESULT   ALT 23  0 - 53 U/L   Comment: HEMOLYSIS AT THIS LEVEL MAY AFFECT RESULT   Alkaline Phosphatase 66  39 - 117 U/L   Comment: HEMOLYSIS AT THIS LEVEL MAY AFFECT RESULT   Total Bilirubin 0.7  0.3 - 1.2 mg/dL   GFR calc  non Af Amer >90  >90 mL/min   GFR calc Af Amer >90  >90 mL/min   Comment: (NOTE)     The eGFR has been calculated using the CKD EPI equation.     This calculation has not been validated in all clinical situations.     eGFR's persistently <90 mL/min signify possible Chronic Kidney     Disease.     HEENT: normal Cardio: RRR and no murmur Resp: CTA B/L and unlabored GI: BS positive and NT,ND Extremity:  No Edema Skin:   Intact Neuro: Confused, oriented to self and [place] Cranial Nerve II-XII normal, Abnormal Motor 4/5 in BUE and BLE, Abnormal FMC Ataxic/ dec FMC and Other RLAS 5 Musc/Skel:  Normal Gen NAD   Assessment/Plan: 1. 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 Physiatrist is providing close team supervision and 24 hour management of active medical problems listed below. Physiatrist and rehab team continue to assess barriers to discharge/monitor patient progress toward functional and  medical goals. FIM: FIM - Bathing Bathing Steps Patient Completed: Chest;Right Arm;Left Arm;Abdomen;Front perineal area;Right upper leg;Left upper leg Bathing: 3: Mod-Patient completes 5-7 [id number] parts or 50-74%  FIM - Lower Body Dressing/Undressing Lower body dressing/undressing steps patient completed: Don/Doff right shoe;Don/Doff left shoe Lower body dressing/undressing: 2: Max-Patient completed 25-49% of tasks  FIM - TMusicianDevices: Grab bar or rail for support Toileting: 1: Total-Patient completed zero steps, helper did all 3  FIM - TRadio producerDevices: WInsurance account managerTransfers: 2-To toilet/BSC: Max A (lift and lower assist);3-From toilet/BSC: Mod A (lift or lower assist) (LOB to the RT with static standing )  FIM - Bed/Chair Transfer Bed/Chair Transfer Assistive Devices: Arm rests;Bed rails Bed/Chair Transfer: 4: Sit > Supine: Min A (steadying pt. > 75%/lift 1 leg);3: Chair or W/C > Bed: Mod A (lift or lower assist)  FIM - Locomotion: Wheelchair Distance: 15 Locomotion: Wheelchair: 1: Total Assistance/staff pushes wheelchair (Pt<25%) FIM - Locomotion: Ambulation Ambulation/Gait Assistance: 1: +2 Total assist Locomotion: Ambulation: 1: Two helpers  Comprehension Comprehension Mode: Auditory Comprehension: 3-Understands basic 50 - 74% of the time/requires cueing 25 - 50%  of the time  Expression Expression Mode: Verbal Expression: 3-Expresses basic 50 - 74% of the time/requires cueing 25 - 50% of the time. Needs to repeat parts of sentences.  Social Interaction Social Interaction: 2-Interacts appropriately 25 - 49% of time - Needs frequent redirection.  Problem Solving Problem Solving: 2-Solves basic 25 - 49% of the time - needs direction more than half the time to initiate, plan or complete simple activities  Memory Memory: 2-Recognizes or recalls 25 - 49% of the time/requires cueing 51 - 75% of the time  Medical  Problem List and Plan:  1. left-sided SDH/SAH after a fall  2. DVT Prophylaxis/Anticoagulation:. Venous Doppler of lower extremities 01/07/2014 shows left DVT mid peroneal vein. Lovenox initiated 01/08/2014 after being cleared by neurosurgery. Consider followup vascular study in the next week to note any propagation of DVT  3. Pain Management: Tylenol as needed  4. Mood: Seroquel 100 mg each bedtime. Bed alarm for safety  5. Neuropsych: This patient is not capable of making decisions on his own behalf.  6. Dysphagia. Dysphagia 1 honey liquids. Monitor hydration. Followup speech therapy  7. Hypertension. Clonidine patch 0-1 mg weekly. Monitor with increased mobility  8. Tobacco abuse. Counseling  9. Klebsiella UTI.  Keflex as directed   LOS (Days) 2 A FACE TO FACE EVALUATION WAS PERFORMED  KIRSTEINS,ANDREW E 01/10/2014, 8:10 AM

## 2014-01-10 NOTE — Progress Notes (Signed)
Occupational Therapy Session Note  Patient Details  Name: MAXFIELD GILDERSLEEVE MRN: [id number] Date of Birth: [birth year]  Today's Date: 01/10/2014 Time: 0900-1000 Time Calculation (min): 60 min  Short Term Goals: Week 1:  OT Short Term Goal 1 (Week 1): Pt will complete toilet transfer min (A) with grab bar OT Short Term Goal 2 (Week 1): pt will complete shower transfer min (A) with bench OT Short Term Goal 3 (Week 1): Pt will follow 2 step simple command 3 out 4 trials OT Short Term Goal 4 (Week 1): Pt will complete bed mobility min guard (A)  Skilled Therapeutic Interventions/Progress Updates: ADL-retraining with focus on attention (sustained/focused), awareness (intellectual), memory (immediate/short-term), functional mobility and sequencing.   Patient received supine in bed, sitter at bedside.   Patient alert but disoriented to place, time, and situation.   With moderate simple command/cues, and redirection, patient reoriented to situation and receptive to bathing/dressing at shower level.   Patient completed transfer from bed to w/c, squat-pivot (270 deg) with steadying assist for safety.   Patient requested brief social contact with therapist prior to bathing and engaged in analysis of his current situation.   With simple redirection, patient escorted to bath in w/c, completed stand-pivot transfer to tub bench and assist with doffing diaper prior to transfer.   Patient performed bathing, sitting and standing using grab as instructed for stability while standing to wash peri-area.  Patient unable to reach lower legs and feet this session.  Patient elected to dress in bathroom with setup for upper body; no clothing for lower (total assist to don new diaper).    Patient escorted back to bed using RW with min guard assist, and min verbal cues for direction.   OT donned TEDs and provided phone at patient's request following supervised transfer to recliner.   Patient relayed message to his spouse regarding need  for clean clothing.   Safety belt affixed as physical therapist entered room.      Therapy Documentation Precautions:  Precautions Precautions: Fall Precaution Comments: gait belt for safety Restrictions Weight Bearing Restrictions: No  Vital Signs: Therapy Vitals Temp: 97.5 F (36.4 C) Temp src: Axillary Pulse Rate: 74 Resp: 18 BP: 118/54 mmHg Patient Position, if appropriate: Lying Oxygen Therapy SpO2: 90 % O2 Device: Nasal cannula O2 Flow Rate (L/min): 2 L/min  Pain: Pain Assessment Pain Assessment: No/denies pain  ADL: ADL ADL Comments: see FIM  See FIM for current functional status  Therapy/Group: Individual Therapy  Second session: Time: 1430-1500 Time Calculation (min):  30 min  Pain Assessment: No/denies pain  Skilled Therapeutic Interventions: Therapeutic activity with focus on improved attention and awareness, dynamic standing/sitting balance, functional mobility, endurance, and functional transfers.  Patient received in day room attended by family and engaged in mild argument relating to his desire to "get up and out of the chair."   OT intercepted patient prior to escalation of agitation and removed him from overstimulation by family and sitter while he remained in his w/c.   With redirection, patient agreed to escorted ambulation from his room to gym to perform general exercise: ball toss while reorienting to facility location and directions.   Patient ambulated with min guard assist and performed transfers to mat, X2, with verbal cues and hand guidance for foot placement and technique.   Patient performed ball toss activity until reporting fatigue (approx 7 minutes).   Patient was then escorted back to his room but required rest break of 4 minutes at midpoint and contact  guard from chair to room, with LOB only 1 time (left leg buckling).  Patient left in room recliner, RN attending.    See FIM for current functional status  Therapy/Group: Individual  Therapy  Jakevious Hollister 01/10/2014, 10:53 AM

## 2014-01-10 NOTE — Progress Notes (Signed)
Speech Language Pathology Daily Session Note  Patient Details  Name: Maurice Paul MRN: [id number] Date of Birth: 11/15/1945  Today's Date: 01/10/2014 Time: 0820-0845 Time Calculation (min): 25 min  Short Term Goals: Week 1: SLP Short Term Goal 1 (Week 1): Patient will utlize safe swallow strategies to minimize overt s/s of aspiration with Min multimodal cues.  SLP Short Term Goal 2 (Week 1): Patient will demonstrate intellectual awareness with Max multimodal cues. SLP Short Term Goal 3 (Week 1): Patient will demonstrate selective attention for 10 minutes with Min multimodal cues.  SLP Short Term Goal 4 (Week 1): Patient will utilize external aids to assist in recall of new and daily information with Max multimodal cues.  SLP Short Term Goal 5 (Week 1): Patient will complete basic problem solving tasks with Max multimodal cues  Skilled Therapeutic Interventions: Skilled intervention for dysphagia and cognition complete.  Patient required mod A cues to clear throat intermittently throughout morning meal as well as verbal cues (min A) to take smaller bites.  He had cough x 2 (delayed, mild) that cleared after reswallow.  He was oriented x1, with max verbal cues needed to recall orientation information given to him, after  3 minutes.  He was unable to recall basic information such as address/telephone number.  Continue with current treatment plan.   FIM:  Problem Solving Problem Solving: 2-Solves basic 25 - 49% of the time - needs direction more than half the time to initiate, plan or complete simple activities Memory Memory: 2-Recognizes or recalls 25 - 49% of the time/requires cueing 51 - 75% of the time FIM - Eating Eating Activity: 5: Needs verbal cues/supervision;5: Set-up assist for open containers  Pain Pain Assessment Pain Assessment: No/denies pain  Therapy/Group: Individual Therapy  Lenny PastelSturgill, Henreitta Spittler Leigh 01/10/2014, 4:42 PM

## 2014-01-10 NOTE — Progress Notes (Signed)
Physical Therapy Session Note  Patient Details  Name: Maurice BaizeWalter S Cuellar MRN: [id number] Date of Birth: 05/04/1946  Today's Date: 01/10/2014 Time: 1000-1105 Time Calculation (min): 65 min  Short Term Goals: Week 1:  PT Short Term Goal 1 (Week 1): Patient will perform bed mobility with modA. PT Short Term Goal 2 (Week 1): Patient will perform functional transfers with modA. PT Short Term Goal 3 (Week 1): Patient will ambulate 150' with modA. PT Short Term Goal 4 (Week 1): Patient will negotiate 5 steps with 2 handrails and modA. PT Short Term Goal 5 (Week 1): Patient will follow basic, one step commands in automatic functional task 75% of opportunities with mod cues.  Skilled Therapeutic Interventions/Progress Updates:    Patient received sitting in recliner, finishing OT session. Session focused on initiation, sustained attention, command following, sequencing, and organization with all functional mobility and cognitive remediation tasks. Patient continues to confabulate and with periods of lability throughout session, stating he is "frustrated" and feeling like he's "in two different worlds." Functional gait training in controlled environment 120' x1 with modA, mod cues for safe RW management. Cognitive remediation activities: sorting deck of cards by color, min cues, able to sustain attention for 2'; pipe tree activity with cross design and proper pieces provided, min cues; field goal design and patient required to choose proper pieces from field, mod cues for accuracy; able to sustain attention to activity with min cues.  Patient requesting diet coke, thickened to honey thick and patient consumed approx 4-6 oz without overt s/s of aspiration. Patient left sitting in wheelchair in day room with sitter, wife, and 2 children present; seatbelt donned.  Therapy Documentation Precautions:  Precautions Precautions: Fall Precaution Comments: gait belt for safety Restrictions Weight Bearing  Restrictions: No Pain: Pain Assessment Pain Assessment: No/denies pain Pain Score: 0-No pain Locomotion : Ambulation Ambulation/Gait Assistance: 3: Mod assist   See FIM for current functional status  Therapy/Group: Individual Therapy  Chipper HerbBridget S Zaydon Kinser S. Shirlena Brinegar, PT, DPT 01/10/2014, 11:19 AM

## 2014-01-10 NOTE — Progress Notes (Signed)
Social Work  Social Work Assessment and Plan  Patient Details  Name: Maurice Paul MRN: [id number] Date of Birth: [birth year]  Today's Date: 01/10/2014  Problem List:  Patient Active Problem List   Diagnosis Date Noted  . SDH (subdural hematoma) 01/08/2014  . DVT (deep venous thrombosis) 01/07/2014  . HTN (hypertension) 12/30/2013  . Acute respiratory failure with hypoxia and hypercarbia 12/25/2013  . Metabolic encephalopathy 12/25/2013  . Morbid obesity 12/25/2013  . SAH (subarachnoid hemorrhage) 12/24/2013   Past Medical History:  Past Medical History  Diagnosis Date  . PVD (peripheral vascular disease)   . Tobacco chew use     for 30 years   Past Surgical History:  Past Surgical History  Procedure Laterality Date  . Cyst removal trunk     Social History:  reports that he quit smoking about 15 years ago. His smokeless tobacco use includes Chew. He reports that he drinks alcohol. His drug history is not on file.  Family / Support Systems Marital Status: Married How Long?: 37 yrs (pt's 2nd marriage) Patient Roles: Spouse;Parent Spouse/Significant Other: wife, Guss Farruggia @ ([phone] or [phone] Children: pt has two children from first marriage:  Casimiro Needle ([place]) and Fountain ([place]) - both visiting over weekend;  one son with wife, Lavella Hammock [facility]) @ [phone] Anticipated Caregiver: wife Ability/Limitations of Caregiver: wife reports she is able to physically assist pt. at min to mod level  Caregiver Availability: 24/7 Family Dynamics: good relationship among all family members.  Children very concerned and supportive of pt's wife and anticipated stressors she is facing once pt comes home.  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  Defer to wife.   Abuse/Neglect Physical Abuse: Denies Verbal Abuse: Denies Sexual Abuse: Denies Exploitation of patient/patient's resources: Denies Self-Neglect: Denies  Emotional Status Pt's affect, behavior adn adjustment status: Pt sitting in recliner with son beside him.  Little direct eye contact when he speaks - looking at television.  speech is rambling but occasionally appropriate response.  Does not appear to be in any emotional distress.  Wife notes he had some periods of agitation on prior unit, however, has not seen any behavioral or emotional difficulties the past couple of days Recent Psychosocial Issues: None Pyschiatric History: None Substance Abuse History: None  Patient / Family Perceptions, Expectations & Goals Pt/Family understanding of illness & functional limitations: could not elicit any appropriate response when questioning pt about situation - he was distracted by television.  Wife and family with general understanding of the areas of brain affected in this injury and of current functional limitiations.  Wife with appropriate concerns about potential safety issues at home with impaired cognition.  Education ongoing. Premorbid pt/family roles/activities: pt was completely independent, however, pt and children note he was very sedentary.  Active with local chuch and pt with [employer].  Family notes pt spent much of his time on his computer and helping others with technical issues.  Wife also notes that he was "a night owl" and often not going to bed until between 4-6 am and sleeping until the afternoon. Anticipated changes in roles/activities/participation: Wife will need to provide 24/7 supervision to pt which places her in a caregiver role.  Little "back up " help available to her locally. Pt/family expectations/goals: "I don't know  really.  I just want him to make as much progress as he can"  (wife)  [employer] Community Agencies: None Premorbid Home Care/DME Agencies: None Transportation available at discharge: yes Resource referrals recommended: Neuropsychology;Support group (specify)  Discharge Planning Living Arrangements: Spouse/significant other Support Systems: Spouse/significant other;Children;Friends/neighbors;Church/faith community Type of Residence: Private residence Insurance Resources: Medicare;Private Insurance (specify) ([facility] [employer] [facility] Financial Screen Referred: No Living Expenses: Own Money Management: Patient Does the patient have any problems obtaining your medications?: No Home Management: wife primarily Patient/Family Preliminary Plans: pt to return home with wife who can provide physical assistance and 24/7 support Social Work Anticipated Follow Up Needs: HH/OP Expected length of stay: 18-22 days  Clinical Impression Unfortunate gentleman here after a fall down his home stairs and suffering a TBI.  Wife in good physical health, retired and able to provide 24/7 assistance.  She is appropriately concerned about safety for pt at home due to his cognitive impairments.  Asking good questions.  Pt does not appear in any emotional distress - will monitor.  Following for support, education and d/c planning needs.  Damarion Mendizabal 01/10/2014, 4:04 PM

## 2014-01-10 NOTE — Progress Notes (Signed)
Speech Language Pathology Daily Session Note  Patient Details  Name: Jewel BaizeWalter S Barron MRN: [id number] Date of Birth: 09/12/1946  Today's Date: 01/10/2014 Time: 1140-1200 Time Calculation (min): 20 min  Short Term Goals: Week 1: SLP Short Term Goal 1 (Week 1): Patient will utlize safe swallow strategies to minimize overt s/s of aspiration with Min multimodal cues.  SLP Short Term Goal 2 (Week 1): Patient will demonstrate intellectual awareness with Max multimodal cues. SLP Short Term Goal 3 (Week 1): Patient will demonstrate selective attention for 10 minutes with Min multimodal cues.  SLP Short Term Goal 4 (Week 1): Patient will utilize external aids to assist in recall of new and daily information with Max multimodal cues.  SLP Short Term Goal 5 (Week 1): Patient will complete basic problem solving tasks with Max multimodal cues  Skilled Therapeutic Interventions: Skilled intervention for expressive/written language complete.  Patient was able to read basic information, slow rate but fluent.  Written expression assessed, and wife stated that his signature looked "normal."  He was asked to write down simple sentences dictated to him, and this was moderately illegible.  His glasses, though he wore them, need repaired, as he had them on when he fell, so this could have contributed to the illegible hand writing.  Continue with current treatment plan.      FIM:  Expression Expression Mode: Verbal Problem Solving Problem Solving: 2-Solves basic 25 - 49% of the time - needs direction more than half the time to initiate, plan or complete simple activities Memory Memory: 2-Recognizes or recalls 25 - 49% of the time/requires cueing 51 - 75% of the time FIM - Eating Eating Activity: 5: Needs verbal cues/supervision;5: Set-up assist for open containers  Pain Pain Assessment Pain Assessment: No/denies pain  Therapy/Group: Individual Therapy  Lenny PastelSturgill, Hillari Zumwalt Leigh 01/10/2014, 4:48 PM

## 2014-01-11 NOTE — Progress Notes (Signed)
1000; Pt's IV infiltrated, Pt taking frequent fluids po ( honey); Dr. Wynn BankerKirsteins aware; orders received, D/C IV,monitor labs (k+ in IVF's) push po fluids.

## 2014-01-12 ENCOUNTER — Inpatient Hospital Stay (HOSPITAL_COMMUNITY): Payer: Medicare Other | Admitting: *Deleted

## 2014-01-12 ENCOUNTER — Inpatient Hospital Stay (HOSPITAL_COMMUNITY): Payer: Medicare Other | Admitting: Speech Pathology

## 2014-01-12 ENCOUNTER — Encounter (HOSPITAL_COMMUNITY): Payer: Medicare Other | Admitting: Occupational Therapy

## 2014-01-12 DIAGNOSIS — R1310 Dysphagia, unspecified: Secondary | ICD-10-CM

## 2014-01-12 DIAGNOSIS — W108XXA Fall (on) (from) other stairs and steps, initial encounter: Secondary | ICD-10-CM

## 2014-01-12 DIAGNOSIS — S065X0A Traumatic subdural hemorrhage without loss of consciousness, initial encounter: Secondary | ICD-10-CM

## 2014-01-12 DIAGNOSIS — S066X0A Traumatic subarachnoid hemorrhage without loss of consciousness, initial encounter: Secondary | ICD-10-CM

## 2014-01-12 LAB — CBC
HEMATOCRIT: 43.5 % (ref 39.0–52.0)
HEMOGLOBIN: 14.6 g/dL (ref 13.0–17.0)
MCH: 34 pg (ref 26.0–34.0)
MCHC: 33.6 g/dL (ref 30.0–36.0)
MCV: 101.2 fL — ABNORMAL HIGH (ref 78.0–100.0)
Platelets: 248 10*3/uL (ref 150–400)
RBC: 4.3 MIL/uL (ref 4.22–5.81)
RDW: 13.7 % (ref 11.5–15.5)
WBC: 8.2 10*3/uL (ref 4.0–10.5)

## 2014-01-12 MED ORDER — ALUM & MAG HYDROXIDE-SIMETH 200-200-20 MG/5ML PO SUSP
30.0000 mL | ORAL | Status: DC | PRN
  Administered 2014-01-12 – 2014-01-19 (×2): 30 mL via ORAL
  Filled 2014-01-12 (×2): qty 30

## 2014-01-12 NOTE — Progress Notes (Signed)
Subjective/Complaints: 68 y.o. right-handed male with history of tobacco abuse. Patient on no scheduled medications prior to admission. Admitted 12/24/2013 after a fall down approximately 8-10 steps and found to by his wife. No report of loss of consciousness. CT of the head showed subarachnoid hemorrhage over the high left parietal convexities and a small amount of subdural hemorrhage along the falx and tentorium without hydrocephalus or midline shift. CT cervical spine negative. Neurosurgery consulted Dr. Barnett Abu with conservative care. Followup MRI of the brain 01/02/2014 showed persistent small volume of intracranial hemorrhage both subarachnoid and subdural no other acute abnormalities. Patient did require short intubation and was extubated 12/26/2013. EEG consistent with diffuse multifocal encephalopathy. Venous Doppler studies 01/07/2014 shows DVT left mid peroneal vein and Lovenox was initiated   Up in bathroom with OT---didn't sleep last night?   Objective: Vital Signs: Blood pressure 133/72, pulse 74, temperature 98.5 F (36.9 C), temperature source Oral, resp. rate 16, height 5\' 10"  (1.778 m), weight 127.6 kg (281 lb 4.9 oz), SpO2 89.00%. No results found. Results for orders placed during the hospital encounter of 01/08/14 (from the past 72 hour(s))  CBC     Status: Abnormal   Collection Time    01/12/14  8:00 AM      Result Value Ref Range   WBC 8.2  4.0 - 10.5 K/uL   RBC 4.30  4.22 - 5.81 MIL/uL   Hemoglobin 14.6  13.0 - 17.0 g/dL   HCT 16.1  09.6 - 04.5 %   MCV 101.2 (*) 78.0 - 100.0 fL   MCH 34.0  26.0 - 34.0 pg   MCHC 33.6  30.0 - 36.0 g/dL   RDW 40.9  81.1 - 91.4 %   Platelets 248  150 - 400 K/uL     HEENT: normal Cardio: RRR and no murmur Resp: CTA B/L and unlabored GI: BS positive and NT,ND Extremity:  No Edema Skin:   Intact Neuro: Confused, oriented to self and [place] Cranial Nerve II-XII normal, Abnormal Motor 4/5 in BUE and BLE. Thought processing  circumferential/tangential. Language of confusion?  Abnormal FMC Ataxic/ dec FMC and Other RLAS 5+ Musc/Skel:  Normal Gen NAD   Assessment/Plan: 1. 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 Physiatrist is providing close team supervision and 24 hour management of active medical problems listed below. Physiatrist and rehab team continue to assess barriers to discharge/monitor patient progress toward functional and medical goals. FIM: FIM - Bathing Bathing Steps Patient Completed: Chest;Right Arm;Left Arm;Abdomen;Front perineal area;Left upper leg;Right upper leg Bathing: 3: Mod-Patient completes 5-7 69f 10 parts or 50-74%  FIM - Upper Body Dressing/Undressing Upper body dressing/undressing steps patient completed: Thread/unthread right sleeve of pullover shirt/dresss;Thread/unthread left sleeve of pullover shirt/dress;Put head through opening of pull over shirt/dress;Pull shirt over trunk Upper body dressing/undressing: 5: Set-up assist to: Obtain clothing/put away FIM - Lower Body Dressing/Undressing Lower body dressing/undressing steps patient completed: Don/Doff right shoe;Don/Doff left shoe Lower body dressing/undressing: 0: Wears gown/pajamas-no public clothing  FIM - Toileting Toileting steps completed by patient: Adjust clothing prior to toileting;Adjust clothing after toileting Toileting Assistive Devices: Grab bar or rail for support Toileting: 4: Steadying assist  FIM - Diplomatic Services operational officer Devices: Best boy Transfers: 4-To toilet/BSC: Min A (steadying Pt. > 75%)  FIM - Bed/Chair Transfer Bed/Chair Transfer Assistive Devices: Arm rests;Walker Bed/Chair Transfer: 4: Bed > Chair or W/C: [employer]  A (steadying Pt. > 75%);4: Chair or W/C > Bed: Min A (steadying Pt. > 75%)  FIM - Locomotion: Wheelchair Distance:  15 Locomotion: Wheelchair: 1: Total Assistance/staff pushes wheelchair (Pt<25%) FIM - Locomotion: Ambulation Locomotion: Ambulation Assistive Devices: Designer, industrial/productWalker - Rolling Ambulation/Gait Assistance: 3: Mod assist Locomotion: Ambulation: 2: Travels 50 - 149 ft with moderate assistance (Pt: 50 - 74%)  Comprehension Comprehension Mode: Auditory Comprehension: 3-Understands basic 50 - 74% of the time/requires cueing 25 - 50%  of the time  Expression Expression Mode: Verbal Expression: 3-Expresses basic 50 - 74% of the time/requires cueing 25 - 50% of the time. Needs to repeat parts of sentences.  Social Interaction Social Interaction: 3-Interacts appropriately 50 - 74% of the time - May be physically or verbally inappropriate.  Problem Solving Problem Solving: 2-Solves basic 25 - 49% of the time - needs direction more than half the time to initiate, plan or complete simple activities  Memory Memory: 2-Recognizes or recalls 25 - 49% of the time/requires cueing 51 - 75% of the time  Medical Problem List and Plan:  1. left-sided SDH/SAH after a fall  2. DVT Prophylaxis/Anticoagulation:. Venous Doppler of lower extremities 01/07/2014 shows left DVT mid peroneal vein. Lovenox initiated 01/08/2014 after being cleared by neurosurgery. ---blood clots in urine this am---hold lovenox  -re-check dopplers later this week  -hgb 14.6 3. Pain Management: Tylenol as needed  4. Mood: Seroquel 100 mg each bedtime. Bed alarm for safety  5. Neuropsych: This patient is not capable of making decisions on his own behalf.  6. Dysphagia. Dysphagia 1 honey liquids. Monitor hydration. Followup speech therapy  7. Hypertension. Clonidine patch 0-1 mg weekly. Monitor with increased mobility  8. Tobacco abuse. Counseling  9. Klebsiella UTI. Keflex as directed   LOS (Days) 4 A FACE TO FACE EVALUATION WAS PERFORMED  Malyssa Maris T 01/12/2014, 8:38 AM

## 2014-01-12 NOTE — Progress Notes (Signed)
Speech Language Pathology Daily Session Note  Patient Details  Name: Maurice Paul MRN: [id number] Date of Birth: 04/21/1946  Today's Date: 01/12/2014 Session 1: Time: 1130-1215 Time Calculation (min): 45 min  Session 2: Time: 1410-1500 Time Calculation (min): 50 min  Short Term Goals: Week 1: SLP Short Term Goal 1 (Week 1): Patient will utlize safe swallow strategies to minimize overt s/s of aspiration with Min multimodal cues.  SLP Short Term Goal 2 (Week 1): Patient will demonstrate intellectual awareness with Max multimodal cues. SLP Short Term Goal 3 (Week 1): Patient will demonstrate selective attention for 10 minutes with Min multimodal cues.  SLP Short Term Goal 4 (Week 1): Patient will utilize external aids to assist in recall of new and daily information with Max multimodal cues.  SLP Short Term Goal 5 (Week 1): Patient will complete basic problem solving tasks with Max multimodal cues  Skilled Therapeutic Interventions: Session 1: Skilled therapeutic intervention addressed dysphagia goals. Patient required Min verbal cues for small bites/sips with lunch meal of Dys. 1 textures and trials of nectar thick liquids via cup. Patient consumed without overt s/s of aspiration. Patient upgraded to nectar thick liquids via cup. Patient with decreased language of confusion and was asking appropriate questions in regards to current cognitive function. Continue with current plan of care.   Session 2: Skilled therapeutic intervention addressed cognitive goals. Student provided Max multimodal cues for selective attention in a moderately distracting environment and memory with use of a visual aid to complete basic money management task. Patient with increased emergent awareness stating that activity was harder than expected due to decreased attention and concentration. Patient required Min verbal cues for orientation to place and was otherwise oriented. Patient consumed trials of thin liquid with  cough x1; suspect cough to clear any aspirates and was placed on water protocol. Recommend trials of thin liquid during meal tomorrow prior to upgrade. Continue with current plan of care.    FIM:  Comprehension Comprehension Mode: Auditory Comprehension: 4-Understands basic 75 - 89% of the time/requires cueing 10 - 24% of the time Expression Expression Mode: Verbal Expression: 3-Expresses basic 50 - 74% of the time/requires cueing 25 - 50% of the time. Needs to repeat parts of sentences. Social Interaction Social Interaction: 4-Interacts appropriately 75 - 89% of the time - Needs redirection for appropriate language or to initiate interaction. Problem Solving Problem Solving: 2-Solves basic 25 - 49% of the time - needs direction more than half the time to initiate, plan or complete simple activities Memory Memory: 2-Recognizes or recalls 25 - 49% of the time/requires cueing 51 - 75% of the time FIM - Eating Eating Activity: 5: Needs verbal cues/supervision;5: Set-up assist for open containers  Pain Pain Assessment Pain Assessment: No/denies pain  Therapy/Group: Individual Therapy  Maurice Paul 01/12/2014, 3:22 PM

## 2014-01-12 NOTE — Progress Notes (Addendum)
Physical Therapy Session Note  Patient Details  Name: Maurice Paul MRN: [id number] Date of Birth: 07/28/1946  Today's Date: 01/12/2014 Time: 0830-0930, 1100-1130, and 1300-1330 Time Calculation (min): 60 min, 30 min, and 30 min  Short Term Goals: Week 1:  PT Short Term Goal 1 (Week 1): Patient will perform bed mobility with modA. PT Short Term Goal 2 (Week 1): Patient will perform functional transfers with modA. PT Short Term Goal 3 (Week 1): Patient will ambulate 150' with modA. PT Short Term Goal 4 (Week 1): Patient will negotiate 5 steps with 2 handrails and modA. PT Short Term Goal 5 (Week 1): Patient will follow basic, one step commands in automatic functional task 75% of opportunities with mod cues.  Skilled Therapeutic Interventions/Progress Updates:    First session: Patient received sitting in wheelchair, just finished breakfast. Patient oriented x4. Session focused on safety with all functional mobility and cognitive remediation with emphasis on orientation and short term memory. Functional ambulation >150' with RW and min guard, >150' x2 without AD, R UE around therapist's shoulders for support, minA. Standing ball toss with letter ball naming foods, patient demonstrating self-awareness of difficulty of task, asking to sit to continue. Patient able to name foods starting with correct letter 75% of opportunities with min cues. Additionally, patient able to recall previous foods named during task ~25% of opportunities with min phonemic cues. Patient left sitting in recliner with seatbelt donned and all needs within reach, RN and sitter present.  Second session: Patient received sitting in recliner. Session focused on sustained attention with therapeutic activities. Functional ambulation >150'x2 with R HHA and minA, cues for appropriate pacing. Patient able to sustain attention to boxing activity for desired number of punches, min-modA for balance secondary to quick movements. Ambulation  with B LE kicking yoga block with minA overall, 2 instances of maxA for LOB secondary to narrow BOS and increased external distractions of family members watching. Patient seated in wheelchair, handed off to SLP for diner's club.  Third session: Patient received standing at sink with nurse tech present. Session focused on functional ambulation and sustained attention to therapeutic activities. Functional ambulation 170' x2 with R HHA and min-modA for lateral sway, cues to look up instead of at ground. Stair negotiation x5 steps with B handrails, minA, alternating pattern. NuStep Level 4-6 x5' with B UE/LE with emphasis on sustained attention to task. Patient able to sustain attention for up to 90" before requiring cues for redirection to task. Alternating LE stair taps with min-modA for balance and weight shifting. Patient reports need to use bathroom at end of session, returned to room and performed toilet transfer with minA, min A for standing balance while patient manages clothing, continent of urine. Patient returned to room and left sitting in recliner with seatbelt donned, sitter present; all needs within reach.  Therapy Documentation Precautions:  Precautions Precautions: Fall Precaution Comments: gait belt for safety Restrictions Weight Bearing Restrictions: No Pain: Pain Assessment Pain Assessment: No/denies pain Pain Score: 0-No pain Locomotion : Ambulation Ambulation/Gait Assistance: 4: Min guard   See FIM for current functional status  Therapy/Group: Individual Therapy  Chipper HerbBridget S Nole Robey S. Ginelle Bays, PT, DPT 01/12/2014, 9:36 AM

## 2014-01-12 NOTE — Plan of Care (Signed)
Problem: RH BLADDER ELIMINATION Goal: RH STG MANAGE BLADDER WITH ASSISTANCE STG Manage Bladder With max Assistance  Outcome: Not Progressing Incontinent of urine, even with timed toileting, requiring max assistance to change.   Comments:  Incontinent of urine, even with timed toileting, requiring max assistance to change.

## 2014-01-12 NOTE — Progress Notes (Signed)
At 0150 Incontinent of urine in diaper. Obvious blood in diaper with blood clots. Paged pharmacist R/T hematuria and clots, since patient on lovenox. At 0500 incontinent of urine with blood clots noted again. 2 -3 plus edema to legs. BLE's discolored. Alfredo MartinezMurray, Danile Trier A

## 2014-01-12 NOTE — Progress Notes (Signed)
The skilled treatment note has been reviewed and SLP is in agreement.  Kule Gascoigne, M.A., CCC-SLP  [phone]   

## 2014-01-12 NOTE — Progress Notes (Signed)
Occupational Therapy Session Note  Patient Details  Name: Maurice Paul MRN: [id number] Date of Birth: 12/07/1945  Today's Date: 01/12/2014 Time: 0700-0800 Time Calculation (min): 60 min  Short Term Goals: Week 1:  OT Short Term Goal 1 (Week 1): Pt will complete toilet transfer min (A) with grab bar OT Short Term Goal 2 (Week 1): pt will complete shower transfer min (A) with bench OT Short Term Goal 3 (Week 1): Pt will follow 2 step simple command 3 out 4 trials OT Short Term Goal 4 (Week 1): Pt will complete bed mobility min guard (A)  Skilled Therapeutic Interventions/Progress Updates:    1:1 self care retraining, cognitive remediation, dynamic balance, RW safety: Pt supine on arrival and required lights turned on, name call, running water sounds to arouse. Pt completing bed mobility Min (A) to the left. Pt ambulating to toilet and shower this session. Pt cued to turn left with RW due to Rt side weakness and buckling in previous sessions. Pt gathered clothing s/p bath per his request with mod cueing. Pt attempting to place LE on bench to elevate feet high enough to reach. Pt could benefit from Long handle sponge and short stool. Pt dressing at EOB. Pt continues to demonstrate language of confusion and confabulations. Pt positioned in w/c of his choice with gait belt and sitter at end of session with breakfast tray.  Therapy Documentation Precautions:  Precautions Precautions: Fall Precaution Comments: gait belt for safety Restrictions Weight Bearing Restrictions: No General:   Vital Signs:   Pain: Pain Assessment Pain Assessment: No/denies pain Pain Score: 0-No pain ADL: ADL ADL Comments: see FIM Exercises:   Other Treatments:    See FIM for current functional status  Therapy/Group: Individual Therapy  Harolyn RutherfordJones, Brant Peets B 01/12/2014, 12:08 PM Pager: [phone]

## 2014-01-13 ENCOUNTER — Inpatient Hospital Stay (HOSPITAL_COMMUNITY): Payer: Medicare Other | Admitting: *Deleted

## 2014-01-13 ENCOUNTER — Inpatient Hospital Stay (HOSPITAL_COMMUNITY): Payer: Medicare Other | Admitting: Speech Pathology

## 2014-01-13 ENCOUNTER — Encounter (HOSPITAL_COMMUNITY): Payer: Medicare Other

## 2014-01-13 DIAGNOSIS — S065X0A Traumatic subdural hemorrhage without loss of consciousness, initial encounter: Secondary | ICD-10-CM

## 2014-01-13 DIAGNOSIS — R1310 Dysphagia, unspecified: Secondary | ICD-10-CM

## 2014-01-13 DIAGNOSIS — W108XXA Fall (on) (from) other stairs and steps, initial encounter: Secondary | ICD-10-CM

## 2014-01-13 DIAGNOSIS — S066X0A Traumatic subarachnoid hemorrhage without loss of consciousness, initial encounter: Secondary | ICD-10-CM

## 2014-01-13 NOTE — Progress Notes (Signed)
Occupational Therapy Session Note  Patient Details  Name: Maurice Paul MRN: [id number] Date of Birth: 12/01/1945  Today's Date: 01/13/2014 Time: 0800-0858 Time Calculation (min): 58 min  Short Term Goals: Week 1:  OT Short Term Goal 1 (Week 1): Pt will complete toilet transfer min (A) with grab bar OT Short Term Goal 2 (Week 1): pt will complete shower transfer min (A) with bench OT Short Term Goal 3 (Week 1): Pt will follow 2 step simple command 3 out 4 trials OT Short Term Goal 4 (Week 1): Pt will complete bed mobility min guard (A)  Skilled Therapeutic Interventions/Progress Updates:    Pt engaged in BADL retraining including bathing at shower level and dressing with sit<>stand from EOB.  Pt initially used RW to amb to bathroom but quickly pushed to side and amb with only HHA.  Pt completed shower standing using grab bars. Pt required mod verbal cues for safety awareness and redirection to task.  Pt's conversation was tangential throughout session requiring min verbal cues to redirect to topic.  Pt utilized small step stool to place feet on to assist with donning socks and threading pants. Focus on task initiation, attention to task, orientation, functional amb with HHA, dynamic standing balance, safety awareness, and activity tolerance.  Pt remained in recliner at end of session with sitter present.  Therapy Documentation Precautions:  Precautions Precautions: Fall Precaution Comments: gait belt for safety Restrictions Weight Bearing Restrictions: No Pain: Pain Assessment Pain Assessment: No/denies pain  See FIM for current functional status  Therapy/Group: Individual Therapy  [name], [name] 01/13/2014, 8:59 AM

## 2014-01-13 NOTE — Progress Notes (Signed)
Subjective/Complaints: 68 y.o. right-handed male with history of tobacco abuse. Patient on no scheduled medications prior to admission. Admitted 12/24/2013 after a fall down approximately 8-10 steps and found to by his wife. No report of loss of consciousness. CT of the head showed subarachnoid hemorrhage over the high left parietal convexities and a small amount of subdural hemorrhage along the falx and tentorium without hydrocephalus or midline shift. CT cervical spine negative. Neurosurgery consulted Dr. Barnett Abu with conservative care. Followup MRI of the brain 01/02/2014 showed persistent small volume of intracranial hemorrhage both subarachnoid and subdural no other acute abnormalities. Patient did require short intubation and was extubated 12/26/2013. EEG consistent with diffuse multifocal encephalopathy. Venous Doppler studies 01/07/2014 shows DVT left mid peroneal vein and Lovenox was initiated   Up at EOB. No complaints. Sitter stated he slept well.   Objective: Vital Signs: Blood pressure 106/79, pulse 64, temperature 98.2 F (36.8 C), temperature source Oral, resp. rate 18, height 5\' 10"  (1.778 m), weight 127.6 kg (281 lb 4.9 oz), SpO2 88.00%. No results found. Results for orders placed during the hospital encounter of 01/08/14 (from the past 72 hour(s))  CBC     Status: Abnormal   Collection Time    01/12/14  8:00 AM      Result Value Ref Range   WBC 8.2  4.0 - 10.5 K/uL   RBC 4.30  4.22 - 5.81 MIL/uL   Hemoglobin 14.6  13.0 - 17.0 g/dL   HCT 16.1  09.6 - 04.5 %   MCV 101.2 (*) 78.0 - 100.0 fL   MCH 34.0  26.0 - 34.0 pg   MCHC 33.6  30.0 - 36.0 g/dL   RDW 40.9  81.1 - 91.4 %   Platelets 248  150 - 400 K/uL     HEENT: normal Cardio: RRR and no murmur Resp: CTA B/L and unlabored GI: BS positive and NT,ND Extremity:  No Edema Skin:   Intact Neuro: Confused, oriented to self and [place] Cranial Nerve II-XII normal, Abnormal Motor 4/5 in BUE and BLE. Thought processing  circumferential/tangential. Language of confusion?  Abnormal FMC Ataxic/ dec FMC and Other RLAS 5+ Musc/Skel:  Normal Gen NAD   Assessment/Plan: 1. 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 Physiatrist is providing close team supervision and 24 hour management of active medical problems listed below. Physiatrist and rehab team continue to assess barriers to discharge/monitor patient progress toward functional and medical goals.  FIM: FIM - Bathing Bathing Steps Patient Completed: Chest;Right Arm;Left Arm;Abdomen;Front perineal area;Buttocks;Right upper leg;Left upper leg;Right lower leg (including foot);Left lower leg (including foot) Bathing: 4: Min-Patient completes 8-9 55f 10 parts or 75+ percent  FIM - Upper Body Dressing/Undressing Upper body dressing/undressing steps patient completed: Thread/unthread right sleeve of pullover shirt/dresss;Thread/unthread left sleeve of pullover shirt/dress;Put head through opening of pull over shirt/dress;Pull shirt over trunk Upper body dressing/undressing: 5: Set-up assist to: Obtain clothing/put away FIM - Lower Body Dressing/Undressing Lower body dressing/undressing steps patient completed: Thread/unthread right pants leg;Thread/unthread left pants leg;Pull pants up/down Lower body dressing/undressing: 4: Min-Patient completed 75 plus % of tasks  FIM - Toileting Toileting steps completed by patient: Adjust clothing prior to toileting;Performs perineal hygiene;Adjust clothing after toileting Toileting Assistive Devices: Grab bar or rail for support Toileting: 4: Steadying assist  FIM - Diplomatic Services operational officer Devices: Best boy Transfers: 4-To toilet/BSC: Min A (steadying Pt. >  75%)  FIM - Bed/Chair Transfer Bed/Chair Transfer Assistive Devices: Arm rests Bed/Chair Transfer:  4: Bed > Chair or W/C: Min A (steadying Pt. > 75%);4: Chair or W/C > Bed: Min A (steadying Pt. > 75%)  FIM - Locomotion: Wheelchair Distance: 15 Locomotion: Wheelchair: 0: Activity did not occur FIM - Locomotion: Ambulation Locomotion: Ambulation Assistive Devices: Designer, industrial/productWalker - Rolling Ambulation/Gait Assistance: 4: Min assist Locomotion: Ambulation: 4: Travels 150 ft or more with minimal assistance (Pt.>75%)  Comprehension Comprehension Mode: Auditory Comprehension: 4-Understands basic 75 - 89% of the time/requires cueing 10 - 24% of the time  Expression Expression Mode: Verbal Expression: 3-Expresses basic 50 - 74% of the time/requires cueing 25 - 50% of the time. Needs to repeat parts of sentences.  Social Interaction Social Interaction: 4-Interacts appropriately 75 - 89% of the time - Needs redirection for appropriate language or to initiate interaction.  Problem Solving Problem Solving: 2-Solves basic 25 - 49% of the time - needs direction more than half the time to initiate, plan or complete simple activities  Memory Memory: 2-Recognizes or recalls 25 - 49% of the time/requires cueing 51 - 75% of the time  Medical Problem List and Plan:  1. left-sided SDH/SAH after a fall  2. DVT Prophylaxis/Anticoagulation:. Venous Doppler of lower extremities 01/07/2014 shows left DVT mid peroneal vein. Lovenox initiated 01/08/2014 after being cleared by neurosurgery. lovenox held due to hematuria  -re-check dopplers thursday  -hgb 14.6 3. Pain Management: Tylenol as needed  4. Mood: Seroquel 100 mg each bedtime. Bed alarm for safety  5. Neuropsych: This patient is not capable of making decisions on his own behalf.  6. Dysphagia. Dysphagia 1 honey liquids. Monitor hydration. Followup speech therapy  7. Hypertension. Clonidine patch 0-1 mg weekly. Monitor with increased mobility  8. Tobacco abuse. Counseling  9. Klebsiella UTI. Keflex as directed   LOS (Days) 5 A FACE TO FACE EVALUATION  WAS PERFORMED  SWARTZ,ZACHARY T 01/13/2014, 8:17 AM

## 2014-01-13 NOTE — Progress Notes (Signed)
The skilled treatment note has been reviewed and SLP is in agreement.  Champ Keetch, M.A., CCC-SLP  [phone]   

## 2014-01-13 NOTE — Patient Care Conference (Signed)
Inpatient RehabilitationTeam Conference and Plan of Care Update Date: 01/13/2014   Time: 3:05 PM    Patient Name: Maurice Paul      Medical Record Number: [id number]  Date of Birth: 1946/07/03 Sex: Male         Room/Bed: 4W16C/4W16C-01 Payor Info: Payor: BLUE [employer]UE [employer] [place] MEDICARE / Plan: BLUE MEDICARE / Product Type: *No Product type* /    Admitting Diagnosis: SAH  SDH RANCHO V   Admit Date/Time:  01/08/2014  3:11 PM Admission Comments: No comment available   Primary Diagnosis:  <principal problem not specified> Principal Problem: <principal problem not specified>  Patient Active Problem List   Diagnosis Date Noted  . SDH (subdural hematoma) 01/08/2014  . DVT (deep venous thrombosis) 01/07/2014  . HTN (hypertension) 12/30/2013  . Acute respiratory failure with hypoxia and hypercarbia 12/25/2013  . Metabolic encephalopathy 12/25/2013  . Morbid obesity 12/25/2013  . SAH (subarachnoid hemorrhage) 12/24/2013    Expected Discharge Date: Expected Discharge Date: 01/22/14  Team Members Present: Social Worker Present: Amada Jupiter, LCSW Nurse Present: Kennyth Arnold, RN PT Present: Cyndia Skeeters, PT OT Present: Ardis Rowan, COTA SLP Present: Feliberto Gottron, SLP PPS Coordinator present : Tora Duck, RN, CRRN;Becky Henrene Dodge, PT     Current Status/Progress Goal Weekly Team Focus  Medical   TBI, ?ANOXIC BI, POOR ATTENTION, DISTRACTED  IMPROVE AWARENESS, ATTENTION, INSIGHT  RX ABOVE, INCREASE ATTENTION   Bowel/Bladder   Incontinent episodes of bladder, continent of bowel  Continent of bowel and bladder with mod A  Timed toileting q 2hr.   Swallow/Nutrition/ Hydration   Dys. 1 textures with thin liquids via cup and supervision for use of safe swallow strategies  Supervision for use of safe swallow strategies  trials of Dys. 2 textures, diet tolerance with use of swallowing strategies   ADL's   min (A) for adls and sequencing, rancho coma recovery level V  supervision level   sequencing, static and dynamic standing balance, walk in shower with bench min guard   Mobility   min-modA all mobility, including gait x125' with RW  supervision overall  safety, cognitive remediation, orientation, functional mobility, activity tolerance, strengthening   Communication             Safety/Cognition/ Behavioral Observations  Max for sustained attention >5 minutes, Mod for basic problem solving  Min A for problem solving and  supervision for attention  sustained attention, basic problem solving   Pain   No current c/o pain  <3 on a scale of 0-10  Assess for pain q 2hr   Skin   BLE discoloration, 2+ edema to BLE  No new skin breakdown   Assess skin q shift    Rehab Goals Patient on target to meet rehab goals: Yes *See Care Plan and progress notes for long and short-term goals.  Barriers to Discharge: COGNITION, SAFETY AWARENESS    Possible Resolutions to Barriers:  COGNITIVE BEHAVIORAL THERAPIES,     Discharge Planning/Teaching Needs:  home with wife to provide 24/7 supervision  education ongoing;  wife anxious about providing 24/7 care   Team Discussion:  Making good gains, however, still with significant cognitive and language deficits.  Managing thin liquids but must stay on D1 diet due to loss of dentures - his dentist has offered to come make a mold.  Pt's distractibility is main barrier with ambulation and adls.  Need to provide ongoing education to wife.  Revisions to Treatment Plan:  None   Continued Need  for Acute Rehabilitation Level of Care: The patient requires daily medical management by a physician with specialized training in physical medicine and rehabilitation for the following conditions: Daily direction of a multidisciplinary physical rehabilitation program to ensure safe treatment while eliciting the highest outcome that is of practical value to the patient.: Yes Daily medical management of patient stability for increased activity during  participation in an intensive rehabilitation regime.: Yes Daily analysis of laboratory values and/or radiology reports with any subsequent need for medication adjustment of medical intervention for : Neurological problems;Pulmonary problems  Geet Hosking 01/13/2014, 6:57 PM

## 2014-01-13 NOTE — Progress Notes (Signed)
Speech Language Pathology Daily Session Note  Patient Details  Name: Maurice Paul MRN: [id number] Date of Birth: 06/27/1946  Today's Date: 01/13/2014 Time: 1130-1215 Time Calculation (min): 45 min  Short Term Goals: Week 1: SLP Short Term Goal 1 (Week 1): Patient will utlize safe swallow strategies to minimize overt s/s of aspiration with Min multimodal cues.  SLP Short Term Goal 2 (Week 1): Patient will demonstrate intellectual awareness with Max multimodal cues. SLP Short Term Goal 3 (Week 1): Patient will demonstrate selective attention for 10 minutes with Min multimodal cues.  SLP Short Term Goal 4 (Week 1): Patient will utilize external aids to assist in recall of new and daily information with Max multimodal cues.  SLP Short Term Goal 5 (Week 1): Patient will complete basic problem solving tasks with Max multimodal cues  Skilled Therapeutic Interventions: Skilled therapeutic intervention addressed dysphagia goals. Student provided Min multimodal cues for small bites and sips while consuming trials of thin liquid via cup with lunch tray of Dys. 1 textures. Patient with increased spontaneous and subtle throat clearing during meal, possibly due to large sips of thin liquid. Suspect throat clears to clear any penetrates. Patient upgraded to thin liquids via cup with no straw. Patient required Mod multimodal cues to select  attention during meal in a minimally distracting environment. Continue with current plan of care.    FIM:  Comprehension Comprehension Mode: Auditory Comprehension: 4-Understands basic 75 - 89% of the time/requires cueing 10 - 24% of the time Expression Expression Mode: Verbal Expression: 3-Expresses basic 50 - 74% of the time/requires cueing 25 - 50% of the time. Needs to repeat parts of sentences. Social Interaction Social Interaction: 4-Interacts appropriately 75 - 89% of the time - Needs redirection for appropriate language or to initiate interaction. Problem  Solving Problem Solving: 2-Solves basic 25 - 49% of the time - needs direction more than half the time to initiate, plan or complete simple activities Memory Memory: 2-Recognizes or recalls 25 - 49% of the time/requires cueing 51 - 75% of the time FIM - Eating Eating Activity: 5: Needs verbal cues/supervision  Pain Pain Assessment Pain Assessment: No/denies pain  Therapy/Group: Individual Therapy  Maurice Paul 01/13/2014, 3:27 PM

## 2014-01-13 NOTE — Progress Notes (Signed)
Speech Language Pathology Daily Session Notes  Patient Details  Name: Maurice BaizeWalter S Mcnew MRN: [id number] Date of Birth: 11/04/1945  Today's Date: 01/13/2014  Session 1 Time: 1015-1030 Time Calculation (min): 15 min  Session 2 Time: 1345-1415 Time Calculation: 30 min  Short Term Goals: Week 1: SLP Short Term Goal 1 (Week 1): Patient will utlize safe swallow strategies to minimize overt s/s of aspiration with Min multimodal cues.  SLP Short Term Goal 2 (Week 1): Patient will demonstrate intellectual awareness with Max multimodal cues. SLP Short Term Goal 3 (Week 1): Patient will demonstrate selective attention for 10 minutes with Min multimodal cues.  SLP Short Term Goal 4 (Week 1): Patient will utilize external aids to assist in recall of new and daily information with Max multimodal cues.  SLP Short Term Goal 5 (Week 1): Patient will complete basic problem solving tasks with Max multimodal cues  Skilled Therapeutic Interventions:  Session 1: Skilled treatment session focused on family education. Upon arrival, pt's wife and children present and educated on pt's current swallow function, diet recommendations and appropriate textures. Pt's family concerned that pt is unable to "reduce stress" since he is unable to chew tobacco and asking questions in regards to "things he can chew" that are within his diet restrictions. This clinician was unable to think of anything that is appropriate at this time but educated she will discuss with physician and RN. Family verbalized understanding.  Continue with current plan of care.    Session 2: Skilled treatment session focused on cognitive goals. SLP facilitated session by providing Mod A verbal and question cues for sequencing/problem solving with 4 step picture cards and Max A  With 6 step picture cards. Pt ambulated back to room with Min A question cues for path finding to his room. Pt left in bed with sitter present. Continue with current plan of care.    FIM:  Comprehension Comprehension Mode: Auditory Comprehension: 4-Understands basic 75 - 89% of the time/requires cueing 10 - 24% of the time Expression Expression Mode: Verbal Expression: 3-Expresses basic 50 - 74% of the time/requires cueing 25 - 50% of the time. Needs to repeat parts of sentences. Social Interaction Social Interaction: 4-Interacts appropriately 75 - 89% of the time - Needs redirection for appropriate language or to initiate interaction. Problem Solving Problem Solving: 2-Solves basic 25 - 49% of the time - needs direction more than half the time to initiate, plan or complete simple activities Memory Memory: 2-Recognizes or recalls 25 - 49% of the time/requires cueing 51 - 75% of the time FIM - Eating Eating Activity: 5: Needs verbal cues/supervision  Pain Pain Assessment Pain Assessment: No/denies pain  Therapy/Group: Individual Therapy  Dalma Panchal 01/13/2014, 3:48 PM

## 2014-01-13 NOTE — Progress Notes (Signed)
Physical Therapy Session Note  Patient Details  Name: Maurice Paul MRN: [id number] Date of Birth: 04/26/1946  Today's Date: 01/13/2014 Time: 0900-1000 and 1300-1345 Time Calculation (min): 60 min and 45 min  Short Term Goals: Week 1:  PT Short Term Goal 1 (Week 1): Patient will perform bed mobility with modA. PT Short Term Goal 2 (Week 1): Patient will perform functional transfers with modA. PT Short Term Goal 3 (Week 1): Patient will ambulate 150' with modA. PT Short Term Goal 4 (Week 1): Patient will negotiate 5 steps with 2 handrails and modA. PT Short Term Goal 5 (Week 1): Patient will follow basic, one step commands in automatic functional task 75% of opportunities with mod cues.  Skilled Therapeutic Interventions/Progress Updates:    First session: Patient received sitting in recliner. Session focused on sustained attention, cognitive remediation, and functional mobility with emphasis on safety. Functional ambulation >150' x2 in controlled environment with R HHA and minA, manual facilitation for weight shift to the L. Patient requires cues to look ahead and not look at feet. Dynamic balance task picking items up off floor, requires minA and cues for appropriate pacing and technique to widen BOS. Newman PiesBall toss activity in standing with letter ball, naming animals that start with specific letters. Patient requires minA for standing balance, cues for safety with quick movements, min-mod cues for appropriate naming and to sustain attention to task.  Patient with c/o dizziness and double vision. Basic BPPV testing performed with sit<>supine to B sides and patient + for nystagmus with each positional change with nystagmus and c/o dizziness lasting periods of 9-13" each. Nystagmus with greater oscillations upon sit>supine. Difficult to assess direction of nystagmus secondary to patient closing eyes. No c/o nausea with movements. Further testing to be performed and patient to be scheduled for vestibular  evaluation.  Second session: Patient received sitting in recliner. Session focused on functional mobility, functional strengthening, floor transfers, and safety. Functional ambulation >150' x3 in controlled environment with R HHA and minA, manual facilitation for weight shift to the L. Patient requires cues to look ahead and not look at feet. Seated trunk diagonals with 4# weighted ball, emphasis on visual tracking during activity. Floor transfer with minA, max cues for proper sequencing. Patient handed off to SLP.  Therapy Documentation Precautions:  Precautions Precautions: Fall Precaution Comments: gait belt for safety Restrictions Weight Bearing Restrictions: No General: Amount of Missed PT Time (min): 15 Minutes Missed Time Reason: Patient fatigue Pain: Pain Assessment Pain Assessment: No/denies pain Pain Score: 0-No pain Locomotion : Ambulation Ambulation/Gait Assistance: 4: Min assist   See FIM for current functional status  Therapy/Group: Individual Therapy  Chipper HerbBridget S Shalin Linders S. Amirah Goerke, PT, DPT 01/13/2014, 1:57 PM

## 2014-01-14 ENCOUNTER — Inpatient Hospital Stay (HOSPITAL_COMMUNITY): Payer: Medicare Other | Admitting: Speech Pathology

## 2014-01-14 ENCOUNTER — Inpatient Hospital Stay (HOSPITAL_COMMUNITY): Payer: Medicare Other | Admitting: *Deleted

## 2014-01-14 ENCOUNTER — Encounter (HOSPITAL_COMMUNITY): Payer: Medicare Other | Admitting: Occupational Therapy

## 2014-01-14 DIAGNOSIS — S066X0A Traumatic subarachnoid hemorrhage without loss of consciousness, initial encounter: Secondary | ICD-10-CM

## 2014-01-14 DIAGNOSIS — W108XXA Fall (on) (from) other stairs and steps, initial encounter: Secondary | ICD-10-CM

## 2014-01-14 DIAGNOSIS — S065X0A Traumatic subdural hemorrhage without loss of consciousness, initial encounter: Secondary | ICD-10-CM

## 2014-01-14 DIAGNOSIS — R1310 Dysphagia, unspecified: Secondary | ICD-10-CM

## 2014-01-14 NOTE — Progress Notes (Signed)
Subjective/Complaints: 68 y.o. right-handed male with history of tobacco abuse. Patient on no scheduled medications prior to admission. Admitted 12/24/2013 after a fall down approximately 8-10 steps and found to by his wife. No report of loss of consciousness. CT of the head showed subarachnoid hemorrhage over the high left parietal convexities and a small amount of subdural hemorrhage along the falx and tentorium without hydrocephalus or midline shift. CT cervical spine negative. Neurosurgery consulted Dr. Barnett Abu with conservative care. Followup MRI of the brain 01/02/2014 showed persistent small volume of intracranial hemorrhage both subarachnoid and subdural no other acute abnormalities. Patient did require short intubation and was extubated 12/26/2013. EEG consistent with diffuse multifocal encephalopathy. Venous Doppler studies 01/07/2014 shows DVT left mid peroneal vein and Lovenox was initiated   Nights have been better. Generally sleeps. Does like to get up without assist. Denies pain.   Objective: Vital Signs: Blood pressure 122/72, pulse 71, temperature 97 F (36.1 C), temperature source Oral, resp. rate 18, height 5\' 10"  (1.778 m), weight 127.6 kg (281 lb 4.9 oz), SpO2 91.00%. No results found. Results for orders placed during the hospital encounter of 01/08/14 (from the past 72 hour(s))  CBC     Status: Abnormal   Collection Time    01/12/14  8:00 AM      Result Value Ref Range   WBC 8.2  4.0 - 10.5 K/uL   RBC 4.30  4.22 - 5.81 MIL/uL   Hemoglobin 14.6  13.0 - 17.0 g/dL   HCT 81.1  91.4 - 78.2 %   MCV 101.2 (*) 78.0 - 100.0 fL   MCH 34.0  26.0 - 34.0 pg   MCHC 33.6  30.0 - 36.0 g/dL   RDW 95.6  21.3 - 08.6 %   Platelets 248  150 - 400 K/uL     HEENT: normal Cardio: RRR and no murmur Resp: CTA B/L and unlabored GI: BS positive and NT,ND Extremity:  No Edema Skin:   Intact Neuro: Confused, oriented to self and [place] Cranial Nerve II-XII normal, Abnormal Motor 4/5  in BUE and BLE. Thought processing circumferential/tangential. Language of confusion less--can be fairly focused at times.  RLAS 5+ to 6.  Musc/Skel:  Normal Gen NAD   Assessment/Plan: 1. 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 Physiatrist is providing close team supervision and 24 hour management of active medical problems listed below. Physiatrist and rehab team continue to assess barriers to discharge/monitor patient progress toward functional and medical goals.  FIM: FIM - Bathing Bathing Steps Patient Completed: Chest;Right Arm;Left Arm;Abdomen;Front perineal area;Buttocks;Right upper leg;Left upper leg;Right lower leg (including foot);Left lower leg (including foot) Bathing: 4: Steadying assist  FIM - Upper Body Dressing/Undressing Upper body dressing/undressing steps patient completed: Thread/unthread right sleeve of pullover shirt/dresss;Thread/unthread left sleeve of pullover shirt/dress;Put head through opening of pull over shirt/dress;Pull shirt over trunk Upper body dressing/undressing: 5: Supervision: Safety issues/verbal cues FIM - Lower Body Dressing/Undressing Lower body dressing/undressing steps patient completed: Thread/unthread right underwear leg;Thread/unthread left underwear leg;Pull underwear up/down;Thread/unthread right pants leg;Thread/unthread left pants leg;Pull pants up/down;Don/Doff left shoe;Don/Doff right shoe Lower body dressing/undressing: 4: Min-Patient completed 75 plus % of tasks  FIM - Toileting Toileting steps completed by patient: Adjust clothing prior to toileting;Performs perineal hygiene;Adjust clothing after toileting Toileting Assistive Devices: Grab bar or rail for support Toileting: 4: Steadying assist  FIM - Diplomatic Services operational officer Devices: [employer]  bars Toilet Transfers: 4-To  toilet/BSC: Min A (steadying Pt. > 75%)  FIM - Bed/Chair Transfer Bed/Chair Transfer Assistive Devices: Arm rests Bed/Chair Transfer: 4: Bed > Chair or W/C: Min A (steadying Pt. > 75%);4: Chair or W/C > Bed: Min A (steadying Pt. > 75%);4: Supine > Sit: Min A (steadying Pt. > 75%/lift 1 leg);5: Sit > Supine: Supervision (verbal cues/safety issues)  FIM - Locomotion: Wheelchair Distance: 15 Locomotion: Wheelchair: 0: Activity did not occur FIM - Locomotion: Ambulation Locomotion: Ambulation Assistive Devices: Other (comment) (R HHA) Ambulation/Gait Assistance: 4: Min assist Locomotion: Ambulation: 4: Travels 150 ft or more with minimal assistance (Pt.>75%)  Comprehension Comprehension Mode: Auditory Comprehension: 4-Understands basic 75 - 89% of the time/requires cueing 10 - 24% of the time  Expression Expression Mode: Verbal Expression: 3-Expresses basic 50 - 74% of the time/requires cueing 25 - 50% of the time. Needs to repeat parts of sentences.  Social Interaction Social Interaction: 4-Interacts appropriately 75 - 89% of the time - Needs redirection for appropriate language or to initiate interaction.  Problem Solving Problem Solving: 2-Solves basic 25 - 49% of the time - needs direction more than half the time to initiate, plan or complete simple activities  Memory Memory: 2-Recognizes or recalls 25 - 49% of the time/requires cueing 51 - 75% of the time  Medical Problem List and Plan:  1. left-sided SDH/SAH after a fall  2. DVT Prophylaxis/Anticoagulation:. Venous Doppler of lower extremities 01/07/2014 shows left DVT mid peroneal vein. Lovenox initiated 01/08/2014 after being cleared by neurosurgery. lovenox held due to hematuria  -re-check dopplers thursday  -hgb 14.6 3. Pain Management: Tylenol as needed  4. Mood: Seroquel 100 mg each bedtime to help with sleep wake cycle. Sitter for safety 5. Neuropsych: This patient is not capable of making decisions on his own behalf.   6. Dysphagia. Dysphagia 1 honey liquids. Monitor hydration. Followup speech therapy  7. Hypertension. Clonidine patch 0-1 mg weekly. Monitor with increased mobility  8. Tobacco abuse. Counseling  9. Klebsiella UTI. Keflex as directed   LOS (Days) 6 A FACE TO FACE EVALUATION WAS PERFORMED  Ranelle OysterZachary T Keino Placencia 01/14/2014, 8:23 AM

## 2014-01-14 NOTE — Progress Notes (Signed)
Speech Language Pathology Daily Session Notes  Patient Details  Name: Jewel BaizeWalter S Musto MRN: [id number] Date of Birth: 06/29/1946  Today's Date: 01/14/2014 Session 1 Time: [id number] Time Calculation (min): 45 min  Session 2 Time: 1400-1430 Time Calculation (min): 30 min  Short Term Goals: Week 1: SLP Short Term Goal 1 (Week 1): Patient will utlize safe swallow strategies to minimize overt s/s of aspiration with Min multimodal cues.  SLP Short Term Goal 2 (Week 1): Patient will demonstrate intellectual awareness with Max multimodal cues. SLP Short Term Goal 3 (Week 1): Patient will demonstrate selective attention for 10 minutes with Min multimodal cues.  SLP Short Term Goal 4 (Week 1): Patient will utilize external aids to assist in recall of new and daily information with Max multimodal cues.  SLP Short Term Goal 5 (Week 1): Patient will complete basic problem solving tasks with Max multimodal cues  Skilled Therapeutic Interventions: Session 1: Skilled treatment session addressed dysphagia goals. Patient consumed Dys. 1 textures and thin liquids via cup with supervision level cues for use of safe swallow strategies with cough x1, suspect cough to clear any potential aspriates. Patient consumed trials of Dys. 2 and Dys. 3 textures without overt s/s of aspiration, recommend trial tray of Dys. 2 textures before upgrade. Continue with current plan of care.   Session 2: Skilled therapeutic intervention addressed dysphagia goals. Patient consumed trials of thin liquids via straw with cough X1, suspect cough to clear any potential aspirates. Patient also consumed trials of Dys. 2 and Dys. 3 textures without s/s of aspiration. Recommend trial tray of Dys. 2 textures and thin liquids via straw before upgrade. Patient with decreased confabulation thoroughout session and required Min verbal cues to alternate attention between conversation and PO trials. Continue with current plan of care.    FIM:   Comprehension Comprehension Mode: Auditory Comprehension: 5-Follows basic conversation/direction: With extra time/assistive device Expression Expression Mode: Verbal Expression: 4-Expresses basic 75 - 89% of the time/requires cueing 10 - 24% of the time. Needs helper to occlude trach/needs to repeat words. Social Interaction Social Interaction: 4-Interacts appropriately 75 - 89% of the time - Needs redirection for appropriate language or to initiate interaction. Problem Solving Problem Solving: 2-Solves basic 25 - 49% of the time - needs direction more than half the time to initiate, plan or complete simple activities Memory Memory: 2-Recognizes or recalls 25 - 49% of the time/requires cueing 51 - 75% of the time FIM - Eating Eating Activity: 5: Needs verbal cues/supervision  Pain Pain Assessment Pain Assessment: No/denies pain Pain Score: 0-No pain  Therapy/Group: Individual Therapy  Nancy NordmannChelsea Shiquan Mathieu 01/14/2014, 4:00 PM

## 2014-01-14 NOTE — Progress Notes (Signed)
Occupational Therapy Session Note  Patient Details  Name: Maurice Paul MRN: [id number] Date of Birth: 02/12/1946  Today's Date: 01/14/2014 Time: 0900-0955 Time Calculation (min): 55 min  Short Term Goals: Week 1:  OT Short Term Goal 1 (Week 1): Pt will complete toilet transfer min (A) with grab bar OT Short Term Goal 2 (Week 1): pt will complete shower transfer min (A) with bench OT Short Term Goal 3 (Week 1): Pt will follow 2 step simple command 3 out 4 trials OT Short Term Goal 4 (Week 1): Pt will complete bed mobility min guard (A)  Skilled Therapeutic Interventions/Progress Updates:   Patient seen this am for OT intervention to address safety and sequencing as well as balancing during basic self care activities of bathing and dressing.  Patient verbose, and tangential in his speech, less confabulation noted this am.  Patient with vestibular symptoms with each attempt to reach to floor in sitting and moreso in standing.  Patient attempted to continue to complete task even when visibly off balance / dizzy - and needed cueing to stop and regain equilibrium.  Patient may have been fairly methodical in performance of am routine prior to this injury, and reports frustration at hospital rules limiting his ability to get ready as he would like.  Meanwhile, patient continues with haphazard and stimulus bound approach to gathering clothing, washing self, etc.  Verbal cueing needed consistently throughout this session for thoroughness, task completion, topic maintenance, and safety.    Therapy Documentation Precautions:  Precautions Precautions: Fall Precaution Comments: gait belt for safety Restrictions Weight Bearing Restrictions: No   Pain:  No report of pain ADL: ADL ADL Comments: see FIM  See FIM for current functional status  Therapy/Group: Individual Therapy  Collier SalinaKristin M Gellert 01/14/2014, 9:57 AM

## 2014-01-14 NOTE — Progress Notes (Signed)
Physical Therapy Session Note  Patient Details  Name: Maurice Paul MRN: [id number] Date of Birth: 09/15/1946  Today's Date: 01/14/2014 Time: 1030-1130 and 1300-1400 Time Calculation (min): 60 min and 60 min  Short Term Goals: Week 1:  PT Short Term Goal 1 (Week 1): Patient will perform bed mobility with modA. PT Short Term Goal 2 (Week 1): Patient will perform functional transfers with modA. PT Short Term Goal 3 (Week 1): Patient will ambulate 150' with modA. PT Short Term Goal 4 (Week 1): Patient will negotiate 5 steps with 2 handrails and modA. PT Short Term Goal 5 (Week 1): Patient will follow basic, one step commands in automatic functional task 75% of opportunities with mod cues.  Skilled Therapeutic Interventions/Progress Updates:    AM Session: Patient received sitting in recliner. Session focused on functional mobility and dynamic balance. Functional ambulation >150' x3 with min guard, cues to look up and for sustained attention to task, dual tasking while carrying cup of water. Dynamic balance tasks: standing to toss horseshoes and pick up, two rounds, one on floor and one on foam, requires minA; standing rebounder toss w/ weighted ball, patient requires sitting break after 3 tosses due to balance and attention deficits; Seated rebounder toss w/ weighted ball on elevated mat, feet unsupported, SBA to intermittent minA. Watering plants with min guard for balance when reaching outside BOS. Patient returned to room and left sittin in recliner with all needs within reach.  PM Session: Patient received sitting in recliner. Session focused on safety and energy conservation techniques with prolonged standing/ambulation from unit to lobby and outside. Additional emphasis on way-finding, patient required to use hospital maps to properly navigate to/from unit. Functional ambulation in controlled and community environments >300' several times with min guard, mod cues for sustained attention to task  secondary to external distractions. Patient performed ambulation outside on uneven surfaces including inclines and declines, slants, and up/down 6 stairs with one handrail. Patient requires mod-max questioning cues for proper wayfinding/navigation through hospital as well as mod cues for short term memory to assist with this activity (i.e. "what zone did we say your room is?", etc.). Patient handed off to SLP for next session.  Therapy Documentation Precautions:  Precautions Precautions: Fall Precaution Comments: gait belt for safety Restrictions Weight Bearing Restrictions: No Pain: Pain Assessment Pain Assessment: No/denies pain Pain Score: 0-No pain Locomotion : Ambulation Ambulation/Gait Assistance: 4: Min guard   See FIM for current functional status  Therapy/Group: Individual Therapy and Co-Treatment in PM session with Rec Therapy  Chipper HerbBridget S Erendida Wrenn S. Anijah Paul, PT, DPT 01/14/2014, 12:04 PM

## 2014-01-14 NOTE — Progress Notes (Signed)
The skilled treatment note has been reviewed and SLP is in agreement.  Gerard Bonus, M.A., CCC-SLP  [phone]   

## 2014-01-14 NOTE — Progress Notes (Signed)
Recreational Therapy Assessment and Plan  Patient Details  Name: Maurice Paul MRN: [id number] Date of Birth: [birth year] Today's Date: 01/14/2014  Rehab Potential: Good ELOS: 2 weeks   Assessment Clinical Impression:Problem List:  Patient Active Problem List    Diagnosis  Date Noted   .  SDH (subdural hematoma)  01/08/2014   .  DVT (deep venous thrombosis)  01/07/2014   .  HTN (hypertension)  12/30/2013   .  Acute respiratory failure with hypoxia and hypercarbia  12/25/2013   .  Metabolic encephalopathy  70/70/6237   .  Morbid obesity  12/25/2013   .  SAH (subarachnoid hemorrhage)  12/24/2013    Past Medical History:  Past Medical History   Diagnosis  Date   .  PVD (peripheral vascular disease)    .  Tobacco chew use      for 30 years    Past Surgical History:  Past Surgical History   Procedure  Laterality  Date   .  Cyst removal trunk      Assessment & Plan  Clinical Impression: Maurice Paul is a 68 y.o. right-handed male with history of tobacco abuse. Patient on no scheduled medications prior to admission. Admitted 12/24/2013 after a fall down approximately 8-10 steps and found to by his wife. No report of loss of consciousness. CT of the head showed subarachnoid hemorrhage over the high left parietal convexities and a small amount of subdural hemorrhage along the falx and tentorium without hydrocephalus or midline shift. CT cervical spine negative. Neurosurgery consulted Dr. Kristeen Miss with conservative care. Followup MRI of the brain 01/02/2014 showed persistent small volume of intracranial hemorrhage both subarachnoid and subdural no other acute abnormalities. Patient did require short intubation and was extubated 12/26/2013. EEG consistent with diffuse multifocal encephalopathy. Venous Doppler studies 01/07/2014 shows DVT left mid peroneal vein and Lovenox was initiated after being cleared by neurosurgery.. Currently maintained on a dysphagia 1 honey thick liquid diet.  Klebsiella UTI treated with Keflex. Bouts of delirium and agitation with Seroquel each bedtime. Physical and occupational therapy evaluations completed 12/28/2013 at the recommendations of physical medicine rehabilitation consult. Patient was admitted for comprehensive rehabilitation program. Patient transferred to CIR on 01/08/2014 .   Pt presents with decreased activity tolerance, decreased functional mobility, decreased balance, decreased coordination, decreased attention, decreased awareness, decreased problem solving, decreased memory, decreased safety, left inattention Limiting pt's independence with leisure/community pursuits.  Leisure History/Participation Premorbid leisure interest/current participation: Sports - Other (Comment);Community - Doctor, hospital - Building control surveyor - Travel (Comment) (tennis, cars) Expression Interests: Music (Comment);Dance Other Leisure Interests: Television;Movies;Reading;Computer;Cooking/Baking Leisure Participation Style: With Family/Friends Awareness of Community Resources: Excellent Psychosocial / Spiritual Spiritual Interests: [place] Patient agreeable to Pet Therapy: Yes Does patient have pets?: Yes Social interaction - Mood/Behavior: Cooperative Academic librarian Appropriate for Education?: Yes Recreational Therapy Orientation Orientation -Reviewed with patient: Available activity resources Strengths/Weaknesses Patient Strengths/Abilities: Willingness to participate;Active premorbidly Patient weaknesses: Physical limitations TR Patient demonstrates impairments in the following area(s): Edema;Endurance;Motor;Safety TR Additional Impairment(s): None  Plan Rec Therapy Plan Is patient appropriate for Therapeutic Recreation?: Yes Rehab Potential: Good Treatment times per week: Min 1 time per week .20 mintues Estimated Length of Stay: 2 weeks TR Treatment/Interventions: Adaptive equipment instruction;1:1  session;Balance/vestibular training;Functional mobility training;Community reintegration;Cognitive remediation/compensation;Patient/family education;Therapeutic activities;Recreation/leisure participation;Therapeutic exercise;UE/LE Coordination activities  Recommendations for other services: None  Discharge Criteria: Patient will be discharged from TR if patient refuses treatment 3 consecutive times without medical reason.  If treatment goals not met, if there is a  change in medical status, if patient makes no progress towards goals or if patient is discharged from hospital.  The above assessment, treatment plan, treatment alternatives and goals were discussed and mutually agreed upon: by patient  Waldon Reining 01/14/2014, 4:30 PM

## 2014-01-15 ENCOUNTER — Inpatient Hospital Stay (HOSPITAL_COMMUNITY): Payer: Medicare Other | Admitting: *Deleted

## 2014-01-15 ENCOUNTER — Inpatient Hospital Stay (HOSPITAL_COMMUNITY): Payer: Medicare Other | Admitting: Speech Pathology

## 2014-01-15 ENCOUNTER — Inpatient Hospital Stay (HOSPITAL_COMMUNITY): Payer: Medicare Other | Admitting: Physical Therapy

## 2014-01-15 ENCOUNTER — Encounter (HOSPITAL_COMMUNITY): Payer: Medicare Other

## 2014-01-15 DIAGNOSIS — I82409 Acute embolism and thrombosis of unspecified deep veins of unspecified lower extremity: Secondary | ICD-10-CM

## 2014-01-15 LAB — CBC
HCT: 43.5 % (ref 39.0–52.0)
Hemoglobin: 14.5 g/dL (ref 13.0–17.0)
MCH: 33.3 pg (ref 26.0–34.0)
MCHC: 33.3 g/dL (ref 30.0–36.0)
MCV: 100 fL (ref 78.0–100.0)
Platelets: 210 10*3/uL (ref 150–400)
RBC: 4.35 MIL/uL (ref 4.22–5.81)
RDW: 13.7 % (ref 11.5–15.5)
WBC: 8.1 10*3/uL (ref 4.0–10.5)

## 2014-01-15 NOTE — Progress Notes (Signed)
Physical Therapy Weekly Progress Note  Patient Details  Name: Maurice Paul MRN: [id number] Date of Birth: Aug 20, 1946  Beginning of progress report period: January 09, 2014 End of progress report period: January 15, 2014  Today's Date: 01/15/2014 Time: 1030-1130 and 1330-1415 Time Calculation (min): 60 min and 45 min  Patient has met 5 of 5 short term goals.  Patient has made excellent progress during first reporting period on rehab. Patient currently requires minA-supervision level overall with functional mobility and has made excellent gains in functional strength, quality of gait, and balance. Patient's greatest barriers are his cognitive deficits as patient continues to demonstrate decreased attention, awareness, insight into deficits, and safety awareness. Patient will require 24/7 supervision upon discharge for safety reasons.  Patient continues to demonstrate the following deficits: decreased activity tolerance, poor dynamic balance and delayed/decreased balance strategies, decreased postural control in standing, decreased sustained attention, decreased awareness and poor insight into deficits, poor safety awareness, high falls risk, and therefore will continue to benefit from skilled PT intervention to enhance overall performance with activity tolerance, balance, postural control, ability to compensate for deficits, attention, awareness, coordination and knowledge of precautions.  Patient progressing toward long term goals..  Continue plan of care.  PT Short Term Goals Week 1:  PT Short Term Goal 1 (Week 1): Patient will perform bed mobility with modA. PT Short Term Goal 1 - Progress (Week 1): Met PT Short Term Goal 2 (Week 1): Patient will perform functional transfers with modA. PT Short Term Goal 2 - Progress (Week 1): Met PT Short Term Goal 3 (Week 1): Patient will ambulate 150' with modA. PT Short Term Goal 3 - Progress (Week 1): Met PT Short Term Goal 4 (Week 1): Patient will negotiate  5 steps with 2 handrails and modA. PT Short Term Goal 4 - Progress (Week 1): Met PT Short Term Goal 5 (Week 1): Patient will follow basic, one step commands in automatic functional task 75% of opportunities with mod cues. PT Short Term Goal 5 - Progress (Week 1): Met Week 2:  PT Short Term Goal 1 (Week 2): STGs=LTGs of overall supervision  Skilled Therapeutic Interventions/Progress Updates:    AM Session: Patient received sitting in recliner. Session focused on functional mobility and cognitive remediation. Functional ambulation several bouts around the unit on carpet and tile >150' with close supervision, intermittent instances of min guard for stabilization. ConnectFour game x2 rounds, patient able to follow instructions appropriately, but unable to foresee strategy for future moves without min-mod cues. Patient completed 2 basic math word problems while standing, but reports he needs to stop because he was exhausted/frustrated. Utilized eye patch on R eye during reading activities and patient reports decrease in double vision.  PM Session: Patient received sitting in recliner. Session focused on functional ambulation and dynamic standing balance. Gait training in controlled environment >200' x2 with close supervision, intermittent instances of min guard for stabilization secondary to lateral sway. Wii bowling in standing x10 frames, approx 30 min, seated rest break after each frame. Patient returned to room and performed all aspects of toileting with close supervision-min guard. Patient left supine in bed with bed alarm on and sitter present, all needs within reach.  Therapy Documentation Precautions:  Precautions Precautions: Fall Precaution Comments: gait belt for safety Restrictions Weight Bearing Restrictions: No Pain: Pain Assessment Pain Assessment: No/denies pain Locomotion : Ambulation Ambulation/Gait Assistance: 5: Supervision;4: Min guard   See FIM for current functional  status  Therapy/Group: Individual Therapy  Teena Irani  Flint Melter. Karesha Trzcinski, PT, DPT 01/15/2014, 12:21 PM

## 2014-01-15 NOTE — Progress Notes (Signed)
Occupational Therapy Session Note  Patient Details  Name: Maurice Paul MRN: [id number] Date of Birth: 06/02/1946  Today's Date: 01/15/2014 Time: 0700-0755 Time Calculation (min): 55 min  Short Term Goals: Week 1:  OT Short Term Goal 1 (Week 1): Pt will complete toilet transfer min (A) with grab bar OT Short Term Goal 2 (Week 1): pt will complete shower transfer min (A) with bench OT Short Term Goal 3 (Week 1): Pt will follow 2 step simple command 3 out 4 trials OT Short Term Goal 4 (Week 1): Pt will complete bed mobility min guard (A)  Skilled Therapeutic Interventions/Progress Updates:    Pt engaged in BADL retraining including bathing at shower level and dressing with sit<>stand from EOB.  Pt amb without AD at close supervision to gather clothing and supplies before walking into shower.  Pt completed bathing while standing and using grab bars for support.  Pt used long handle brush to bathe LB while standing. Pt initially attempted to thread pants while standing but agreed with recommendation to sit EOB to thread pants.  Pt required assistance to don socks.  Pt reported several occasions of dizziness during BADLs. Focus on activity tolerance, safety awareness, functional amb without AD, standing balance, and compensatory strategies.  Therapy Documentation Precautions:  Precautions Precautions: Fall Precaution Comments: gait belt for safety Restrictions Weight Bearing Restrictions: No Pain: Pain Assessment Pain Assessment: No/denies pain  See FIM for current functional status  Therapy/Group: Individual Therapy  Rich Bravehomas Chappell Judea Riches 01/15/2014, 7:56 AM

## 2014-01-15 NOTE — Progress Notes (Signed)
The skilled treatment note has been reviewed and SLP is in agreement.  Golden Emile, M.A., CCC-SLP  [phone]   

## 2014-01-15 NOTE — Progress Notes (Signed)
Subjective/Complaints: 68 y.o. right-handed male with history of tobacco abuse. Patient on no scheduled medications prior to admission. Admitted 12/24/2013 after a fall down approximately 8-10 steps and found to by his wife. No report of loss of consciousness. CT of the head showed subarachnoid hemorrhage over the high left parietal convexities and a small amount of subdural hemorrhage along the falx and tentorium without hydrocephalus or midline shift. CT cervical spine negative. Neurosurgery consulted Dr. Barnett AbuHenry Elsner with conservative care. Followup MRI of the brain 01/02/2014 showed persistent small volume of intracranial hemorrhage both subarachnoid and subdural no other acute abnormalities. Patient did require short intubation and was extubated 12/26/2013. EEG consistent with diffuse multifocal encephalopathy. Venous Doppler studies 01/07/2014 shows DVT left mid peroneal vein and Lovenox was initiated   Sleeping better. No issues per sitter.   Objective: Vital Signs: Blood pressure 120/63, pulse 68, temperature 98.2 F (36.8 C), temperature source Oral, resp. rate 17, height 5\' 10"  (1.778 m), weight 127.6 kg (281 lb 4.9 oz), SpO2 90.00%. No results found. No results found for this or any previous visit (from the past 72 hour(s)).   HEENT: normal Cardio: RRR and no murmur Resp: CTA B/L and unlabored GI: BS positive and NT,ND Extremity:  No Edema Skin:   Intact Neuro: Confused, oriented to self and [place] Cranial Nerve II-XII normal, Abnormal Motor 4/5 in BUE and BLE. Thought processing circumferential/tangential. Language of confusion less--can be fairly focused at times.  RLAS 5+ to 6.  Musc/Skel:  Normal Gen NAD   Assessment/Plan: 1. 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 Physiatrist is providing close team supervision and 24 hour management  of active medical problems listed below. Physiatrist and rehab team continue to assess barriers to discharge/monitor patient progress toward functional and medical goals.  FIM: FIM - Bathing Bathing Steps Patient Completed: Chest;Right Arm;Left Arm;Abdomen;Front perineal area;Buttocks;Right upper leg;Left upper leg;Right lower leg (including foot);Left lower leg (including foot) Bathing: 5: Supervision: Safety issues/verbal cues  FIM - Upper Body Dressing/Undressing Upper body dressing/undressing steps patient completed: Thread/unthread right sleeve of pullover shirt/dresss;Thread/unthread left sleeve of pullover shirt/dress;Put head through opening of pull over shirt/dress;Pull shirt over trunk Upper body dressing/undressing: 5: Supervision: Safety issues/verbal cues FIM - Lower Body Dressing/Undressing Lower body dressing/undressing steps patient completed: Thread/unthread right underwear leg;Thread/unthread left underwear leg;Pull underwear up/down;Thread/unthread right pants leg;Thread/unthread left pants leg;Pull pants up/down;Fasten/unfasten pants;Don/Doff right shoe;Don/Doff left shoe Lower body dressing/undressing: 4: Min-Patient completed 75 plus % of tasks  FIM - Toileting Toileting steps completed by patient: Adjust clothing prior to toileting;Performs perineal hygiene;Adjust clothing after toileting Toileting Assistive Devices: Grab bar or rail for support Toileting: 5: Supervision: Safety issues/verbal cues  FIM - Diplomatic Services operational officerToilet Transfers Toilet Transfers Assistive Devices: Best boyWalker;Grab bars Toilet Transfers: 4-To toilet/BSC: Min A (steadying Pt. > 75%)  FIM - Bed/Chair Transfer Bed/Chair Transfer Assistive Devices: Arm rests Bed/Chair Transfer: 4: Bed > Chair or W/C: Min A (steadying Pt. > 75%);4: Chair or W/C > Bed: Min A (steadying Pt. > 75%)  FIM - Locomotion: Wheelchair Distance: 15 Locomotion: Wheelchair: 0: Activity did not occur FIM - Locomotion: Ambulation Locomotion:  Ambulation Assistive Devices: Other (comment) (R HHA) Ambulation/Gait Assistance: 4: Min guard Locomotion: Ambulation: 4: Travels 150 ft or more with minimal assistance (Pt.>75%)  Comprehension Comprehension Mode: Auditory Comprehension: 5-Follows basic conversation/direction: With extra time/assistive device  Expression Expression Mode: Verbal Expression: 4-Expresses basic 75 - 89% of  the time/requires cueing 10 - 24% of the time. Needs helper to occlude trach/needs to repeat words.  Social Interaction Social Interaction: 4-Interacts appropriately 75 - 89% of the time - Needs redirection for appropriate language or to initiate interaction.  Problem Solving Problem Solving: 2-Solves basic 25 - 49% of the time - needs direction more than half the time to initiate, plan or complete simple activities  Memory Memory: 2-Recognizes or recalls 25 - 49% of the time/requires cueing 51 - 75% of the time  Medical Problem List and Plan:  1. left-sided SDH/SAH after a fall  2. DVT Prophylaxis/Anticoagulation:. Venous Doppler of lower extremities 01/07/2014 shows left DVT mid peroneal vein. Lovenox initiated 01/08/2014 after being cleared by neurosurgery. lovenox held due to hematuria  -re-check dopplers today  -hgb 14.6 3. Pain Management: Tylenol as needed  4. Mood: Seroquel 100 mg each bedtime to help with sleep wake cycle. Sitter for safety 5. Neuropsych: This patient is not capable of making decisions on his own behalf.  6. Dysphagia. Dysphagia 1 honey liquids. Monitor hydration. Followup speech therapy  7. Hypertension. Clonidine patch 0-1 mg weekly. Monitor with increased mobility  8. Tobacco abuse. Counseling  9. Klebsiella UTI. Keflex as directed   LOS (Days) 7 A FACE TO FACE EVALUATION WAS PERFORMED  Ranelle Oyster 01/15/2014, 8:12 AM

## 2014-01-15 NOTE — Progress Notes (Signed)
Social Work Patient ID: Maurice Paul, male   DOB: [birth year], 68 y.o.   MRN: [id number]   Met with pt's wife yesterday afternoon to review team conference.  She is aware and agreeable with targeted d/c date of 4/16 with supervision goals.  She is very pleased with cognitive improvements so far and asks if the date might be moved up if reaching goals sooner.  Wife with good questions regarding home management and potential safety issues.  Will continue to follow for d/c planning and support.  Feel it would be beneficial to have patient evaluated by neuropsychology prior to d/c as well.  Lennart Pall, LCSW

## 2014-01-15 NOTE — Progress Notes (Signed)
Physical Therapy Vestibular Assessment and Plan  Patient Details  Name: Maurice Paul MRN: [id number] Date of Birth: 08-06-1946  PT Diagnosis: BPPV   Today's Date: 01/15/2014 Time: 1430-1530 Time Calculation (min): 60 min  Problem List:  Patient Active Problem List   Diagnosis Date Noted  . SDH (subdural hematoma) 01/08/2014  . DVT (deep venous thrombosis) 01/07/2014  . HTN (hypertension) 12/30/2013  . Acute respiratory failure with hypoxia and hypercarbia 12/25/2013  . Metabolic encephalopathy 54/98/2641  . Morbid obesity 12/25/2013  . SAH (subarachnoid hemorrhage) 12/24/2013    Past Medical History:  Past Medical History  Diagnosis Date  . PVD (peripheral vascular disease)   . Tobacco chew use     for 30 years   Past Surgical History:  Past Surgical History  Procedure Laterality Date  . Cyst removal trunk      Assessment & Plan Clinical Impression: 68 y.o. right-handed male with history of tobacco abuse. Patient on no scheduled medications prior to admission. Admitted 12/24/2013 after a fall down approximately 8-10 steps and found to by his wife. No report of loss of consciousness. CT of the head showed subarachnoid hemorrhage over the high left parietal convexities and a small amount of subdural hemorrhage along the falx and tentorium without hydrocephalus or midline shift. CT cervical spine negative. Neurosurgery consulted Dr. Kristeen Miss with conservative care. Followup MRI of the brain 01/02/2014 showed persistent small volume of intracranial hemorrhage both subarachnoid and subdural no other acute abnormalities. Patient transferred to CIR on 01/08/2014 .   Patient reports vertigo "room spinning" dizziness primarily with rolling in bed and supine<>sit. Pt reports some instability at other times but not vertigo. Pt reports h/o severe vertigo as young child but none recently. Upon evaluation pt appears to have a few central related findings mostly with delay in smooth pursuit  and potentially VOR cancellation(?). 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 Pt had significant nystagmus bilaterally with first testing, much less second test. Will benefit form continued assessment to see if further treatment needed. Pt demonstrated poor gaze stability and will also benefit from VOR gaze stabilization exercises.   Patient will benefit from skilled PT intervention to maximize safe functional mobility and minimize fall risk for planned discharge. Anticipate patient will benefit from continued assessment for veritgo.   Recommendations 1. Continued assessment of bed mobility to determine if dizziness still present.  2. VOR x1 horizontal and vertical planes seated. Progress to standing once pt able to tolerate 60 sec moderately fast speed without symptoms and target in focus. Progress to ambulation.   Skilled Therapeutic Intervention Canalith repositioning x 2 for Rt and Lt posterior canalithiasis BPPV. Due to pt's size, increased time needed for obtaining positioning for treatment. Bed also modified to achieve appropriate positioning of head. Pt provided with VOR x 1 horizontal and vertical exercises. Handout provided.  PT Evaluation Precautions/Restrictions  NA Pain  no c/o Eye Alignment WNL  Spontaneous  Nystagmus Negative  Gaze holding nystagmus Negative  Smooth pursuit Slightly delayed  Oculomotor WFL  Saccades Delayed but WFL  Head Thrust Test Positive Rt, mildly  Head Shaking Nystagmus NT  Rt. Hallpike Dix Positive - delayed onset upbeating nystagmus duration <60 sec. Unable to determine torsion in [facility]. Hallpike Dix Positive - delayed onset upbeating nystagmus duration <60 sec. Unable to determine torsion in [place]. Roll Test Not tested due to time constraints  Lt. Roll Test  Not tested due  to time constraints  Motion sensitivity Negative      Visual- Vestibular Interactions:  - Sitting: Pt had  difficulty maintaining good gaze stability with VOR x 1 horizontal and vertical when seated.     Refer to Care Plan for Long Term Goals  Recommendations for other services: None  Discharge Criteria: Patient will be discharged from PT if patient refuses treatment 3 consecutive times without medical reason, if treatment goals not met, if there is a change in medical status, if patient makes no progress towards goals or if patient is discharged from hospital.  The above assessment, treatment plan, treatment alternatives and goals were discussed and mutually agreed upon: by patient  Georgina Peer 01/15/2014, 3:35 PM

## 2014-01-15 NOTE — Progress Notes (Signed)
*  PRELIMINARY RESULTS* Vascular Ultrasound Left lower extremity venous duplex has been completed.  Preliminary findings: No obvious evidence of DVT noted. Previous DVT appears to have resolved.   [place] ChardJill I Eunice RVT 01/15/2014, 12:40 PM

## 2014-01-15 NOTE — Progress Notes (Signed)
Speech Language Pathology Daily Session Note  Patient Details  Name: Maurice Paul MRN: [id number] Date of Birth: 02/22/1946  Today's Date: 01/15/2014 Time: 0800-0845 Time Calculation (min): 45 min  Short Term Goals: Week 1: SLP Short Term Goal 1 (Week 1): Patient will utlize safe swallow strategies to minimize overt s/s of aspiration with Min multimodal cues.  SLP Short Term Goal 2 (Week 1): Patient will demonstrate intellectual awareness with Max multimodal cues. SLP Short Term Goal 3 (Week 1): Patient will demonstrate selective attention for 10 minutes with Min multimodal cues.  SLP Short Term Goal 4 (Week 1): Patient will utilize external aids to assist in recall of new and daily information with Max multimodal cues.  SLP Short Term Goal 5 (Week 1): Patient will complete basic problem solving tasks with Max multimodal cues  Skilled Therapeutic Interventions: Skilled therapeutic session addressed dysphagia goals. Patient consumed Dys. 1 textures with thin liquids via straw with supervision level cues for use of safe swallow strategies. Patient with throat clear x1 and cough x2, suspect throat clear and cough to effectively clear any penetrates/aspirates. Patient upgraded to Dys. 1 textures and thin liquids via straw. Patient required Min verbal cues for selective attention to meal in a mildly distracting environment. Continue with current plan of care.   FIM:  Comprehension Comprehension Mode: Auditory Comprehension: 5-Follows basic conversation/direction: With extra time/assistive device Expression Expression Mode: Verbal Expression: 5-Expresses basic 90% of the time/requires cueing < 10% of the time. Social Interaction Social Interaction: 4-Interacts appropriately 75 - 89% of the time - Needs redirection for appropriate language or to initiate interaction. Problem Solving Problem Solving: 2-Solves basic 25 - 49% of the time - needs direction more than half the time to initiate, plan  or complete simple activities Memory Memory: 2-Recognizes or recalls 25 - 49% of the time/requires cueing 51 - 75% of the time FIM - Eating Eating Activity: 5: Set-up assist for open containers  Pain Pain Assessment Pain Assessment: No/denies pain  Therapy/Group: Individual Therapy  Nancy NordmannChelsea Cumi Sanagustin 01/15/2014, 8:51 AM

## 2014-01-16 ENCOUNTER — Inpatient Hospital Stay (HOSPITAL_COMMUNITY): Payer: Medicare Other | Admitting: Occupational Therapy

## 2014-01-16 ENCOUNTER — Inpatient Hospital Stay (HOSPITAL_COMMUNITY): Payer: Medicare Other

## 2014-01-16 ENCOUNTER — Inpatient Hospital Stay (HOSPITAL_COMMUNITY): Payer: Medicare Other | Admitting: Speech Pathology

## 2014-01-16 ENCOUNTER — Inpatient Hospital Stay (HOSPITAL_COMMUNITY): Payer: Medicare Other | Admitting: Physical Therapy

## 2014-01-16 DIAGNOSIS — W108XXA Fall (on) (from) other stairs and steps, initial encounter: Secondary | ICD-10-CM

## 2014-01-16 DIAGNOSIS — S066X0A Traumatic subarachnoid hemorrhage without loss of consciousness, initial encounter: Secondary | ICD-10-CM

## 2014-01-16 DIAGNOSIS — R1310 Dysphagia, unspecified: Secondary | ICD-10-CM

## 2014-01-16 DIAGNOSIS — S065X0A Traumatic subdural hemorrhage without loss of consciousness, initial encounter: Secondary | ICD-10-CM

## 2014-01-16 NOTE — Progress Notes (Signed)
SLP Cancellation Note  Patient Details Name: Maurice Paul MRN: [id number] DOB: 09/27/1946   Cancelled treatment:       Amount of Missed SLP Time (min): 30 Minutes                                                                                          Missed Time Reason: Nursing care  Pt missed 30 minutes of skilled SLP intervention due to taking a shower with assistance from nursing staff due to incontinence.      Huston FoleyCourtney M Keilynn Marano 01/16/2014, 3:00 PM

## 2014-01-16 NOTE — Progress Notes (Signed)
Physical Therapy Note  Patient Details  Name: Jewel BaizeWalter S Barbara MRN: [id number] Date of Birth: 03/26/1946 Today's Date: 01/16/2014  Time: 730-825 55 minutes  1:1 No c/o pain.  Gait training in controlled environment with supervision, increased sway, pt able to self correct.  Household gait around room to gather clothes and change with supervision for balance and mobility.  Vestibular training with horizontal and vertical head turns with no c/o dizziness.  Standing ball toss all directions with focus on quick movements and head turns, slight increase in dizziness, no LOB.  Newman PiesBall toss with naming task.  Pt requires mod questioning and phonemic cues to name items with each letter, supervision for balance. Pt easily frustrated with this task, requires frequent rest breaks.  Dynamic gait training with head turns with slight increase in dizziness, eases with 2 minutes rest.  Standing balance with step up and tap up to 4'' block.  Pt limited by impaired depth perception.  Problem solved ways to improve balance with decreasing dizziness.  Pt remains motivated to improve and participate in therapy.   Leone BrandKaren K Donawerth 01/16/2014, 8:19 AM

## 2014-01-16 NOTE — Progress Notes (Signed)
Physical Therapy Session Note  Patient Details  Name: Jewel BaizeWalter S Gerstel MRN: [id number] Date of Birth: 10/06/1946  Today's Date: 01/16/2014 Time: [id number] Time Calculation (min): 30 min  Skilled Therapeutic Interventions/Progress Updates:  1:1. Pt received sitting in recliner w/ sitter present, ready for therapy. Focus this session on higher level balance challenges during ambulation as well as gaze stabilization exercises. Pt req overall supervision during amb and challenged by speed changes, start/stops, turns. Pt noted to have increased deviation for straight path and prominence of weight shifts when walking more quickly but able to self-correct. Pt denied onset of dizziness during t/f sup<>sit and B rolling. Pt w/ poor recall of VORx1 exercises with use of handout. Exercises reviewed and performed w/ head turns in horizontal and vertical directions. Pt demonstrated increased difficulty with vertical head motion, seeing double of letter A target. Wife present at end of session, verbally educated regarding exercises and frequency. Both verbalized understanding. Pt sitting in recliner at end of session w/ all needs in reach, sitter and wife in room.   Therapy Documentation Precautions:  Precautions Precautions: Fall Precaution Comments: gait belt for safety Restrictions Weight Bearing Restrictions: No  See FIM for current functional status  Therapy/Group: Individual Therapy  Denzil HughesCaroline S Didi Ganaway 01/16/2014, 3:45 PM

## 2014-01-16 NOTE — Progress Notes (Signed)
Speech Language Pathology Weekly Progress and Session Note  Patient Details  Name: Maurice Paul MRN: [id number] Date of Birth: 1946-10-04  Beginning of progress report period: January 09, 2014 End of progress report period: January 16, 2014  Today's Date: 01/16/2014 Time: 1130-1220 Time Calculation (min): 50 min  Short Term Goals: Week 1: SLP Short Term Goal 1 (Week 1): Patient will utlize safe swallow strategies to minimize overt s/s of aspiration with Min multimodal cues.  SLP Short Term Goal 1 - Progress (Week 1): Met SLP Short Term Goal 2 (Week 1): Patient will demonstrate intellectual awareness with Max multimodal cues. SLP Short Term Goal 2 - Progress (Week 1): Met SLP Short Term Goal 3 (Week 1): Patient will demonstrate selective attention for 10 minutes with Min multimodal cues.  SLP Short Term Goal 3 - Progress (Week 1): Met SLP Short Term Goal 4 (Week 1): Patient will utilize external aids to assist in recall of new and daily information with Max multimodal cues.  SLP Short Term Goal 4 - Progress (Week 1): Met SLP Short Term Goal 5 (Week 1): Patient will complete basic problem solving tasks with Max multimodal cues SLP Short Term Goal 5 - Progress (Week 1): Met    New Short Term Goals: Week 2: SLP Short Term Goal 1 (Week 2): Patient will complete basic problem solving tasks with Mod multimodal cues SLP Short Term Goal 2 (Week 2): Patient will utilize external aids to assist in recall of new and daily information with Mod multimodal cues.  SLP Short Term Goal 3 (Week 2): Patient will demonstrate selective attention for 30 minutes with supervision multimodal cues.  SLP Short Term Goal 4 (Week 2): Patient will demonstrate emergent awareness by self-monitoring and correcting errors with basic functional tasks with Mod A multimodal cues.  SLP Short Term Goal 5 (Week 2): Patient will utlize safe swallow strategies to minimize overt s/s of aspiration with current diet with supervision  multimodal cues.   Weekly Progress Updates: Pt has made excellent gains had mas met 5 of 5 STG's this reporting period due to increased attention, problem solving, awareness, memory and swallowing function. Currently, pt is demonstrating behaviors with a [place] Level VI and requires overall Mod-Max A for functional problem solving, working memory with utilization of compensatory strategies, awareness, attention and safety awareness. Pt is also currently consuming Dys. 2 textures with thin liquids with minimal overt s/s of aspiration and requires Min-Supervision assist for utilization of swallowing compensatory strategies. Patient/family education ongoing.  Pt would benefit from continued skilled SLP intervention to maximize cognitive and swallow function to increase his overall functional independence prior to discharge home.    Intensity: Minumum of 1-2 x/day, 30 to 90 minutes Frequency: 5 out of 7 days Duration/Length of Stay: 01/22/14 Treatment/Interventions: Cognitive remediation/compensation;Environmental controls;Cueing hierarchy;Functional tasks;Therapeutic Activities;Internal/external aids;Dysphagia/aspiration precaution training;Patient/family education   Daily Session  Skilled Therapeutic Interventions: Skilled treatment session focused on dysphagia and cognitive goals. SLP facilitated session by providing supervision verbal cues for utilization of small bites with lunch meal of Dys. 2 textures and thin liquids. Pt without overt s/s of aspiration and recommend pt upgrade to Dys. 2 textures and remain on full supervision. Pt demonstrated increased ability to alternate attention between meal and functional conversation and only required Min verbal cues for redirection. Pt also recalled information from previous therapy sessions with Mod question cues. Continue with current plan of care.     FIM:  Comprehension Comprehension Mode: Auditory Comprehension: 4-Understands basic 75 - 89%  of the  time/requires cueing 10 - 24% of the time Expression Expression: 4-Expresses basic 75 - 89% of the time/requires cueing 10 - 24% of the time. Needs helper to occlude trach/needs to repeat words. Social Interaction Social Interaction: 4-Interacts appropriately 75 - 89% of the time - Needs redirection for appropriate language or to initiate interaction. Problem Solving Problem Solving: 2-Solves basic 25 - 49% of the time - needs direction more than half the time to initiate, plan or complete simple activities Memory Memory: 2-Recognizes or recalls 25 - 49% of the time/requires cueing 51 - 75% of the time FIM - Eating Eating Activity: 5: Supervision/cues Pain No/Denies Pain  Therapy/Group: Individual Therapy  Buzzy Han 01/16/2014, 1:26 PM

## 2014-01-16 NOTE — Progress Notes (Signed)
Occupational Therapy Weekly Progress Note And Treatment Session Notes  Patient Details  Name: Maurice Paul MRN: [id number] Date of Birth: 09-03-1946  Beginning of progress report period: 01/10/14 End of progress report period: 01/16/14  Today's Date: 01/16/2014 Time: 8338-2505 and 230-320 Time Calculation (min): 55 min and 50 min  Patient has met 4 of 4 short term goals.  Patient has made excellent gains this reporting period and functioning at an overall supervision and min-mod cues, occasional min assist for LOB with BADL and functional mobility.  Recommend that patient have 24/7 supervision upon discharge for safety reasons.  Patient continues to demonstrate the following deficits: decreased activity tolerance, poor dynamic balance and delayed/decreased balance strategies, decreased postural control in standing, decreased sustained/selective attention, decreased intellectual awareness, poor insight into deficits, poor safety awareness, ability to compensate for deficits, high falls risk, and therefore will continue to benefit from skilled OT intervention to enhance overall performance with BADL, iADL and Reduce care partner burden.  Patient progressing toward long term goals.  Plan of care revisions: upgrade some goals from [place] assist to supervision.  OT Short Term Goals Week 1:   OT Short Term Goal 1 (Week 1): Pt will complete toilet transfer min (A) with grab bar OT Short Term Goal 1 - Progress (Week 1): Met OT Short Term Goal 2 (Week 1): pt will complete shower transfer min (A) with bench OT Short Term Goal 2 - Progress (Week 1): Met OT Short Term Goal 3 (Week 1): Pt will follow 2 step simple command 3 out 4 trials OT Short Term Goal 3 - Progress (Week 1): Met OT Short Term Goal 4 (Week 1): Pt will complete bed mobility min guard (A) OT Short Term Goal 4 - Progress (Week 1): Met Week 2:   OT Short Term Goal 1 (Week 2): STG=Unmet LTGs of overall Supervision  Skilled Therapeutic  Interventions/Progress Updates:  1)  Patient seated EOB upon arrival with sitter in the room.  Engaged in self care retraining and cognitive remediation to include shower, dress, groom, selective attention, intellectual awareness, sequencing, organization, problem solving and word finding.  Patient ambulated for all self care tasks with close supervision with one slight LOB with turning however he was able to self correct.  Patient retrieved t-shirt and underware yet put back on the dirty and wet t-shirt because "that is what I want to do".  Shower seat was lowered per patient request however he did not use it during the shower only when he sat down to dry his feet.  Patient uses a long shower sponge to wash his peri area and buttocks.  Patient was easily distracted then had difficulty deciding what he was doing or what was next.  Patient with notable word finding difficulty.    2)   Patient resting in recliner with wife and sitter present.  Engaged in cognitive remediation, dynamic balance, tub/shower transfers and vision exercises for diplopia.  Patient reported feeling very tired and did not think he had any other therapy today.  Reviewed during my morning session with him that I would be back this afternoon as well as referred to his written schedule this morning.  Patient instructed to recall where the therapy apartment/kitchen was located and take [place] there.  Patient with some difficulty yet without vcs was able to problem solve.  Decided to find a different tub/shower that was more like his at home.  Wife reports that a grab bar has been placed vertically just outside the  shower area.  Patient climbed in and out of the shower using the horizontal grab bar located on the wall with controls and stated he felt safer with this bar so wife to have friend install one.  Patient able to climb in and out with close supervision for simulated (not wet) shower.  Patient with increased difficulty determining direction  of his hospital room yet with min questioning cues he was able to find his room.  When he tried to recall his room number he stated, "13?" the "14?".  When we got to his room he said, "yea, I thought it was 16".  Patient with diplopia in L & R inferior visual fields.  Performed exercises with patient to decrease the area with double vision.    Therapy Documentation Precautions:  Precautions: Fall Pain: Denies pain in either session ADL: See FIM for current functional status  Therapy/Group: Individual Therapy both sessions  Gaye Pollack 01/16/2014, 12:06 PM

## 2014-01-16 NOTE — Progress Notes (Signed)
Subjective/Complaints: 68 y.o. right-handed male with history of tobacco abuse. Patient on no scheduled medications prior to admission. Admitted 12/24/2013 after a fall down approximately 8-10 steps and found to by his wife. No report of loss of consciousness. CT of the head showed subarachnoid hemorrhage over the high left parietal convexities and a small amount of subdural hemorrhage along the falx and tentorium without hydrocephalus or midline shift. CT cervical spine negative. Neurosurgery consulted Dr. Barnett AbuHenry Elsner with conservative care. Followup MRI of the brain 01/02/2014 showed persistent small volume of intracranial hemorrhage both subarachnoid and subdural no other acute abnormalities. Patient did require short intubation and was extubated 12/26/2013. EEG consistent with diffuse multifocal encephalopathy. Venous Doppler studies 01/07/2014 shows DVT left mid peroneal vein and Lovenox was initiated   No new complaints. In good spirits. Sitting in his chair this am  Objective: Vital Signs: Blood pressure 116/58, pulse 85, temperature 97.7 F (36.5 C), temperature source Oral, resp. rate 18, height 5\' 10"  (1.778 m), weight 127.6 kg (281 lb 4.9 oz), SpO2 92.00%. No results found. Results for orders placed during the hospital encounter of 01/08/14 (from the past 72 hour(s))  CBC     Status: None   Collection Time    01/15/14  8:02 AM      Result Value Ref Range   WBC 8.1  4.0 - 10.5 K/uL   RBC 4.35  4.22 - 5.81 MIL/uL   Hemoglobin 14.5  13.0 - 17.0 g/dL   HCT 16.143.5  09.639.0 - 04.552.0 %   MCV 100.0  78.0 - 100.0 fL   MCH 33.3  26.0 - 34.0 pg   MCHC 33.3  30.0 - 36.0 g/dL   RDW 40.913.7  81.111.5 - 91.415.5 %   Platelets 210  150 - 400 K/uL     HEENT: normal Cardio: RRR and no murmur Resp: CTA B/L and unlabored GI: BS positive and NT,ND Extremity:  No Edema Skin:   Intact Neuro: Confused, oriented to self and [place] Cranial Nerve II-XII normal, Abnormal Motor 4/5 in BUE and BLE. Thought  processing circumferential/tangential. Language of confusion less--can be fairly focused at times.  RLAS 5+ to 6.  Musc/Skel:  Normal Gen NAD   Assessment/Plan: 1. 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 Physiatrist is providing close team supervision and 24 hour management of active medical problems listed below. Physiatrist and rehab team continue to assess barriers to discharge/monitor patient progress toward functional and medical goals.  FIM: FIM - Bathing Bathing Steps Patient Completed: Chest;Right Arm;Left Arm;Abdomen;Front perineal area;Buttocks;Right upper leg;Left upper leg;Right lower leg (including foot);Left lower leg (including foot) Bathing: 5: Supervision: Safety issues/verbal cues  FIM - Upper Body Dressing/Undressing Upper body dressing/undressing steps patient completed: Thread/unthread right sleeve of pullover shirt/dresss;Thread/unthread left sleeve of pullover shirt/dress;Put head through opening of pull over shirt/dress;Pull shirt over trunk Upper body dressing/undressing: 5: Supervision: Safety issues/verbal cues FIM - Lower Body Dressing/Undressing Lower body dressing/undressing steps patient completed: Thread/unthread right underwear leg;Thread/unthread left underwear leg;Pull underwear up/down;Thread/unthread right pants leg;Thread/unthread left pants leg;Pull pants up/down;Fasten/unfasten pants;Don/Doff right shoe;Don/Doff left shoe Lower body dressing/undressing: 4: Min-Patient completed 75 plus % of tasks  FIM - Toileting Toileting steps completed by patient: Adjust clothing prior to toileting;Performs perineal hygiene;Adjust clothing after toileting Toileting Assistive Devices: Grab bar or rail for support Toileting: 5: Supervision: Safety issues/verbal cues  FIM - Diplomatic Services operational officerToilet Transfers Toilet Transfers Assistive Devices:  Walker;Grab bars Toilet Transfers: 4-To toilet/BSC: Min A (steadying Pt. > 75%)  FIM - Bed/Chair Transfer Bed/Chair Transfer Assistive Devices: Arm rests Bed/Chair Transfer: 5: Chair or W/C > Bed: Supervision (verbal cues/safety issues);5: Bed > Chair or W/C: Supervision (verbal cues/safety issues)  FIM - Locomotion: Wheelchair Distance: 15 Locomotion: Wheelchair: 0: Activity did not occur FIM - Locomotion: Ambulation Locomotion: Ambulation Assistive Devices: Other (comment) (R HHA) Ambulation/Gait Assistance: 5: Supervision;4: Min guard Locomotion: Ambulation: 5: Travels 150 ft or more with supervision/safety issues  Comprehension Comprehension Mode: Auditory Comprehension: 5-Follows basic conversation/direction: With extra time/assistive device  Expression Expression Mode: Verbal Expression: 5-Expresses basic 90% of the time/requires cueing < 10% of the time.  Social Interaction Social Interaction: 4-Interacts appropriately 75 - 89% of the time - Needs redirection for appropriate language or to initiate interaction.  Problem Solving Problem Solving: 2-Solves basic 25 - 49% of the time - needs direction more than half the time to initiate, plan or complete simple activities  Memory Memory: 2-Recognizes or recalls 25 - 49% of the time/requires cueing 51 - 75% of the time  Medical Problem List and Plan:  1. left-sided SDH/SAH after a fall  2. DVT Prophylaxis/Anticoagulation:. Venous Doppler of lower extremities 01/07/2014 shows left DVT mid peroneal vein. Lovenox stopped due to hematuria--he is ambulatory  -clots resolved per doppler yesterday    3. Pain Management: Tylenol as needed  4. Mood: Seroquel 100 mg each bedtime to help with sleep wake cycle. Sitter for safety 5. Neuropsych: This patient is not capable of making decisions on his own behalf.  6. Dysphagia. Dysphagia 1 honey liquids. Monitor hydration. Followup speech therapy  7. Hypertension. Clonidine patch 0-1 mg  weekly. Monitor with increased mobility  8. Tobacco abuse. Counseling  9. Klebsiella UTI. Keflex completed   LOS (Days) 8 A FACE TO FACE EVALUATION WAS PERFORMED  Ranelle Oyster 01/16/2014, 8:25 AM

## 2014-01-17 ENCOUNTER — Inpatient Hospital Stay (HOSPITAL_COMMUNITY): Payer: Medicare Other

## 2014-01-17 NOTE — Progress Notes (Signed)
Physical Therapy Session Note  Patient Details  Name: Maurice Paul MRN: [id number] Date of Birth: 08/06/1946  Today's Date: 01/17/2014 Time: 1000-1045 Time Calculation (min): 45 min  Short Term Goals:  Skilled Therapeutic Interventions/Progress Updates:    Pt received supine in bed with sitter present in room. Pt agreeable to therapy. Pt reviewed schedule to observe the correct time of therapy. Pt ambulated in hallways 180 feet without an AD at supervision/close supervision level. Pt sways to the right x2 but without LOB. PT reviewed gaze stabilization exercises with patient, who reports he has not completed since yesterday with therapy. Pt completed gaze stabilization movements, horizontal and vertical 3x5 with target in a different position with each rep. Pt reports min  dizziness with blurred vision when target is lower than his nose/face, requiring him to look down. Blurred vision improved with each rep. PT discussed importance of completing exercises on his own. Pt complete balance/proprioceptive exercises, including standing while tapping the cone 2x10 each LE with a seated rest in between. Pt with noted unsteadiness with movement of the RLE. Pt also completed task of grabbing horse shoe, turning and focusing on rim of basketball goal and placing on hoop. Pt reported no dizziness or blurred vision with this activity. Pt ambulated back to his room and requested a coke from the nourishment station. Pt ambulated with PT to nsg station to obtain coke and back to his room at supervision level. Pt seated in recliner chair with all needs in reach. Nsg present in room, as sitter not on floor at this time.   Therapy Documentation Precautions:  Precautions Precautions: Fall Precaution Comments: gait belt for safety Restrictions Weight Bearing Restrictions: No    Pain: Pain Assessment Pain Assessment: No/denies pain Pain Score: 0-No pain   See FIM for current functional  status  Therapy/Group: Individual Therapy  Keiko Myricks R Lucky Alverson 01/17/2014, 12:14 PM

## 2014-01-17 NOTE — Progress Notes (Signed)
Maurice Paul is a 68 y.o. male 05/15/1946 [id number]  Subjective: No new complaints. No new problems. Working on taxes - reports slept well "better here than at home". Feeling well.  Objective: Vital signs in last 24 hours: Temp:  [97.6 F (36.4 C)-98.4 F (36.9 C)] 97.6 F (36.4 C) (04/11 0649) Pulse Rate:  [71-83] 71 (04/11 0649) Resp:  [18-20] 18 (04/11 0649) BP: (102-117)/(68-77) 117/77 mmHg (04/11 0649) SpO2:  [92 %-93 %] 92 % (04/11 0649) Weight change:  Last BM Date: 01/16/14  Intake/Output from previous day: 04/10 0701 - 04/11 0700 In: 1320 [P.O.:1320] Out: -   Physical Exam General: No apparent distress [facility] - sitter at side   Lungs: Normal effort. Lungs clear to auscultation, no crackles or wheezes. Cardiovascular: Regular rate and rhythm, no edema Musculoskeletal:  Neurovascularly intact   Lab Results: BMET    Component Value Date/Time   NA 144 01/09/2014 0505   K 4.7 01/09/2014 0505   CL 101 01/09/2014 0505   CO2 31 01/09/2014 0505   GLUCOSE 116* 01/09/2014 0505   BUN 5* 01/09/2014 0505   CREATININE 0.62 01/09/2014 0505   CALCIUM 8.7 01/09/2014 0505   GFRNONAA >90 01/09/2014 0505   GFRAA >90 01/09/2014 0505   CBC    Component Value Date/Time   WBC 8.1 01/15/2014 0802   RBC 4.35 01/15/2014 0802   HGB 14.5 01/15/2014 0802   HCT 43.5 01/15/2014 0802   PLT 210 01/15/2014 0802   MCV 100.0 01/15/2014 0802   MCH 33.3 01/15/2014 0802   MCHC 33.3 01/15/2014 0802   RDW 13.7 01/15/2014 0802   LYMPHSABS 2.9 01/09/2014 0505   MONOABS 1.0 01/09/2014 0505   EOSABS 0.3 01/09/2014 0505   BASOSABS 0.0 01/09/2014 0505   CBG's (last 3):  No results found for this basename: GLUCAP,  in the last 72 hours LFT's Lab Results  Component Value Date   ALT 23 01/09/2014   AST 48* 01/09/2014   ALKPHOS 66 01/09/2014   BILITOT 0.7 01/09/2014    Studies/Results: No results found.  Medications:  I have reviewed the patient's current medications. Scheduled Medications: . antiseptic oral rinse  15 mL Mouth  Rinse q12n4p  . cloNIDine  0.1 mg Transdermal Q Tue  . multivitamin with minerals  1 tablet Oral Daily  . QUEtiapine  100 mg Oral QHS   PRN Medications: acetaminophen, alum & mag hydroxide-simeth, ibuprofen, ondansetron (ZOFRAN) IV, ondansetron, sorbitol  Assessment/Plan: Active Problems:   SDH (subdural hematoma) left-sided SDH/SAH after a fall -continue rehab efforts as outlined/ongoing - no changes recommended  2. DVT Prophylaxis/Anticoagulation:. Venous Doppler of lower extremities 01/07/2014 shows left DVT mid peroneal vein. Lovenox stopped due to hematuria--he is ambulatory  -clots resolved per follow up doppler  3. Pain Management: Tylenol as needed  4. Mood: Seroquel 100 mg each bedtime to help with sleep wake cycle. Sitter for safety  -reports sleep better now than at home - consider continuing same at DC 5. Neuropsych: This patient is not capable of making decisions on his own behalf.  6. Dysphagia. Dysphagia 1 honey liquids. Monitor hydration. Followup speech therapy  7. Hypertension. Clonidine patch 0-1 mg weekly. Monitor with increased mobility  8. Tobacco abuse. Counseling  9. Klebsiella UTI. S/p Keflex course  Length of stay, days: 9    Maurice Paul A. Felicity CoyerLeschber, MD 01/17/2014, 8:27 AM

## 2014-01-17 NOTE — Progress Notes (Signed)
Slept good, awake at 0530. Sitter at bedside. Per previous report, patient agitated. Good spirits this AM. 2 plus edema to BLE's. BLE's discolored. Confabulates. Maurice SinnerJamie A Ausha Sieh

## 2014-01-18 ENCOUNTER — Inpatient Hospital Stay (HOSPITAL_COMMUNITY): Payer: Medicare Other | Admitting: *Deleted

## 2014-01-18 LAB — CBC
HEMATOCRIT: 39.8 % (ref 39.0–52.0)
Hemoglobin: 13 g/dL (ref 13.0–17.0)
MCH: 33 pg (ref 26.0–34.0)
MCHC: 32.7 g/dL (ref 30.0–36.0)
MCV: 101 fL — ABNORMAL HIGH (ref 78.0–100.0)
Platelets: 187 10*3/uL (ref 150–400)
RBC: 3.94 MIL/uL — ABNORMAL LOW (ref 4.22–5.81)
RDW: 13.8 % (ref 11.5–15.5)
WBC: 8.9 10*3/uL (ref 4.0–10.5)

## 2014-01-18 LAB — GLUCOSE, CAPILLARY: Glucose-Capillary: 126 mg/dL — ABNORMAL HIGH (ref 70–99)

## 2014-01-18 NOTE — Progress Notes (Signed)
Occupational Therapy Note  Patient Details  Name: Jewel BaizeWalter S Borman MRN: [id number] Date of Birth: 06/17/1946 Today's Date: 01/18/2014  Time: 1000-1100  (60 min) Pain:none Individual session Addressed dynamic balance, occulomotor , VOR in kitchen activity.  .  Ambulated to kitchen and prepared pasta dish.  Provided directions for kitchen set up and ingredients for meal.  Pt ambulated around room with turning, bending, stooping with not LOB and no C/O of dizziness.   Pt able to recall 3 out or 4 ingredients during meal prep Did not remember to let OT know he was on a D2 diet and did not confess his diet restrictions.   OT reminded him he was on a D2 diet and could not eat this food.  Pt. Reluctantly put dish to side. During cooking, pt needed minimal cues for food being hot and not touching.  Ambulated back to room with nursing.     Humberto Sealslizabeth J Aaden Buckman 01/18/2014, 10:21 AM

## 2014-01-18 NOTE — Progress Notes (Signed)
Slept good during night. Impulsive.Requiring sitter for safety. Denies pain. BLE's with edema and discolored. Maurice SinnerJamie A Jarrah Babich

## 2014-01-18 NOTE — Progress Notes (Signed)
Jewel BaizeWalter S Reas is a 68 y.o. male 04/11/1946 [id number]  Subjective: No new complaints. No new problems.  reports he slept well and is feeling well.  Objective: Vital signs in last 24 hours: Temp:  [97.6 F (36.4 C)-98.5 F (36.9 C)] 97.6 F (36.4 C) (04/12 0658) Pulse Rate:  [67-71] 71 (04/12 0658) Resp:  [18-20] 18 (04/12 0658) BP: (105-131)/(72-81) 131/81 mmHg (04/12 0658) SpO2:  [89 %-93 %] 89 % (04/12 0658) Weight change:  Last BM Date: 01/17/14  Intake/Output from previous day: 04/11 0701 - 04/12 0700 In: 1920 [P.O.:1920] Out: -   Physical Exam General: No apparent distress - [facility] - sitter at side   Lungs: Normal effort. Lungs clear to auscultation, no crackles or wheezes. Cardiovascular: Regular rate and rhythm, no edema Musculoskeletal:  Neurovascularly intact   Lab Results: BMET    Component Value Date/Time   NA 144 01/09/2014 0505   K 4.7 01/09/2014 0505   CL 101 01/09/2014 0505   CO2 31 01/09/2014 0505   GLUCOSE 116* 01/09/2014 0505   BUN 5* 01/09/2014 0505   CREATININE 0.62 01/09/2014 0505   CALCIUM 8.7 01/09/2014 0505   GFRNONAA >90 01/09/2014 0505   GFRAA >90 01/09/2014 0505   CBC    Component Value Date/Time   WBC 8.9 01/18/2014 0644   RBC 3.94* 01/18/2014 0644   HGB 13.0 01/18/2014 0644   HCT 39.8 01/18/2014 0644   PLT 187 01/18/2014 0644   MCV 101.0* 01/18/2014 0644   MCH 33.0 01/18/2014 0644   MCHC 32.7 01/18/2014 0644   RDW 13.8 01/18/2014 0644   LYMPHSABS 2.9 01/09/2014 0505   MONOABS 1.0 01/09/2014 0505   EOSABS 0.3 01/09/2014 0505   BASOSABS 0.0 01/09/2014 0505   CBG's (last 3):    Recent Labs  01/18/14 0713  GLUCAP 126*   LFT's Lab Results  Component Value Date   ALT 23 01/09/2014   AST 48* 01/09/2014   ALKPHOS 66 01/09/2014   BILITOT 0.7 01/09/2014    Studies/Results: No results found.  Medications:  I have reviewed the patient's current medications. Scheduled Medications: . antiseptic oral rinse  15 mL Mouth Rinse q12n4p  . cloNIDine  0.1 mg  Transdermal Q Tue  . multivitamin with minerals  1 tablet Oral Daily  . QUEtiapine  100 mg Oral QHS   PRN Medications: acetaminophen, alum & mag hydroxide-simeth, ibuprofen, ondansetron (ZOFRAN) IV, ondansetron, sorbitol  Assessment/Plan: Active Problems:   SDH (subdural hematoma) left-sided SDH/SAH after a fall -continue rehab efforts as outlined/ongoing - no changes recommended  2. DVT Prophylaxis/Anticoagulation:. Venous Doppler of lower extremities 01/07/2014 shows left DVT mid peroneal vein. Lovenox stopped due to hematuria--he is ambulatory  -clots resolved per follow up doppler  3. Pain Management: Tylenol as needed  4. Mood: Seroquel 100 mg each bedtime to help with sleep wake cycle. Sitter for safety  -reports sleep better IP than at home - consider continuing same at DC home 5. Neuropsych: This patient is not capable of making decisions on his own behalf.  6. Dysphagia. Dysphagia 1 honey liquids. Monitor hydration. Followup speech therapy  7. Hypertension. Clonidine patch 0-1 mg weekly. Monitor with increased mobility  8. Tobacco abuse. Counseling  9. Klebsiella UTI. S/p Keflex course  Length of stay, days: 10    Valerie A. Felicity CoyerLeschber, MD 01/18/2014, 9:29 AM

## 2014-01-19 ENCOUNTER — Inpatient Hospital Stay (HOSPITAL_COMMUNITY): Payer: Medicare Other | Admitting: Speech Pathology

## 2014-01-19 ENCOUNTER — Inpatient Hospital Stay (HOSPITAL_COMMUNITY): Payer: Medicare Other | Admitting: *Deleted

## 2014-01-19 ENCOUNTER — Inpatient Hospital Stay (HOSPITAL_COMMUNITY): Payer: Medicare Other

## 2014-01-19 ENCOUNTER — Encounter (HOSPITAL_COMMUNITY): Payer: Medicare Other

## 2014-01-19 DIAGNOSIS — S066X0A Traumatic subarachnoid hemorrhage without loss of consciousness, initial encounter: Secondary | ICD-10-CM

## 2014-01-19 DIAGNOSIS — S065X0A Traumatic subdural hemorrhage without loss of consciousness, initial encounter: Secondary | ICD-10-CM

## 2014-01-19 DIAGNOSIS — S069X9A Unspecified intracranial injury with loss of consciousness of unspecified duration, initial encounter: Secondary | ICD-10-CM

## 2014-01-19 DIAGNOSIS — R1310 Dysphagia, unspecified: Secondary | ICD-10-CM

## 2014-01-19 DIAGNOSIS — S069XAA Unspecified intracranial injury with loss of consciousness status unknown, initial encounter: Secondary | ICD-10-CM

## 2014-01-19 DIAGNOSIS — W108XXA Fall (on) (from) other stairs and steps, initial encounter: Secondary | ICD-10-CM

## 2014-01-19 NOTE — Progress Notes (Signed)
Physical Therapy Session Note  Patient Details  Name: Jewel BaizeWalter S Ives MRN: [id number] Date of Birth: 10/07/1946  Today's Date: 01/19/2014 Time: 0930-1030 and 1330-1410 Time Calculation (min): 60 min and 40 min  Short Term Goals: Week 2:  PT Short Term Goal 1 (Week 2): STGs=LTGs of overall supervision  Skilled Therapeutic Interventions/Progress Updates:    AM Session: Patient received sitting in recliner. Session focused on functional mobility, gaze stabilization exercises, and cognitive remediation with emphasis on sequencing and self-monitoring. All functional mobility performed at supervision level, including ambulation >150' and stair negotiation of 12 stairs with one handrail with step to pattern. Gaze stabilization exercises in seated with horizontal VOR, patient reporting double vision only in inferior aspect of field of vision. 2 games of Hang Man, patient requires min-mod cues for guessing different letters and min cues for sustained attention to task. Patient with overall improved awareness throughout session, stating that Hangman game and stair negotiation were "exhausting." Patient left sitting in recliner with all needs within reach and sitter and wife present.  PM Session: Patient received supine in bed, asleep, but agreeable to therapy. Co-treatment with Rec therapy focused on community re-integration, short term memory, following 3 step commands, and wayfinding. Patient given 3 step command in order to find Solarium on 3rd floor. Patient abel to immediate repeat 3 commands, but requires mod cues to use external aids to remember 1/3 steps (could recall 2/3 without assist). Functional ambulation >300' off unit, on various surfaces (tile, carpet, brick, concrete, etc.). Furniture transfers in [place] on low, cushioned surfaces with supervision. Patient able to way find back to unit and his room with min cues. Supervision overall for mobility without overt LOB. Patient left sitting in  recliner with all needs within reach and sitter and wife present.  Therapy Documentation Precautions:  Precautions Precautions: Fall Precaution Comments: gait belt for safety Restrictions Weight Bearing Restrictions: No Pain: Pain Assessment Pain Assessment: No/denies pain Pain Score: 0-No pain Locomotion : Ambulation Ambulation/Gait Assistance: 5: Supervision   See FIM for current functional status  Therapy/Group: Individual Therapy and Co-Treatment with Rec Therapy this PM  SunocoBridget S Tiffannie Sloss S. Aviona Martenson, PT, DPT 01/19/2014, 10:32 AM

## 2014-01-19 NOTE — Progress Notes (Signed)
Speech Language Pathology Daily Session Notes  Patient Details  Name: Maurice Paul MRN: [id number] Date of Birth: 06/29/1946  Session 1: Today's Date: 01/19/2014 Time: [id number] Time Calculation (min): 45 min  Session 2: Today's Date: 01/19/2014 Time: 4782-95621130-1215 Time Calculation (min): 45 min  Short Term Goals: Week 2: SLP Short Term Goal 1 (Week 2): Patient will complete basic problem solving tasks with Mod multimodal cues SLP Short Term Goal 2 (Week 2): Patient will utilize external aids to assist in recall of new and daily information with Mod multimodal cues.  SLP Short Term Goal 3 (Week 2): Patient will demonstrate selective attention for 30 minutes with supervision multimodal cues.  SLP Short Term Goal 4 (Week 2): Patient will demonstrate emergent awareness by self-monitoring and correcting errors with basic functional tasks with Mod A multimodal cues.  SLP Short Term Goal 5 (Week 2): Patient will utlize safe swallow strategies to minimize overt s/s of aspiration with current diet with supervision multimodal cues.   Skilled Therapeutic Interventions: Session 1: Skilled therapeutic intervention addressed dysphagia and cognitive goals. Patient consumed trials of Dys. 3 textures with thin liquids via straw without overt s/s of aspiration. Patient with minimal oral residue that cleared with liquid wash. Student provided Min verbal cues for problem solving and sustained attention to complete basic math task. Recommend trial tray of Dys. 3 textures before upgrade. Continue with current plan of care.    Session 2: Patient participated in diner's club and consumed trial tray of Dys. 3 textures with thin liquids via cup with supervision level cues for utilization of safe swallow strategies. Patient demonstrated an intermittent, spontaneous throat clear. Suspect throat clear to have effectively cleared any penetrates. Recommend upgrade to Dys. 3 textures and thin liquids via straw with full  supervision. Continue with current plan of care.    FIM:  Comprehension Comprehension Mode: Auditory Comprehension: 5-Understands basic 90% of the time/requires cueing < 10% of the time Expression Expression Mode: Verbal Expression: 5-Expresses basic 90% of the time/requires cueing < 10% of the time. Social Interaction Social Interaction: 4-Interacts appropriately 75 - 89% of the time - Needs redirection for appropriate language or to initiate interaction. Problem Solving Problem Solving: 4-Solves basic 75 - 89% of the time/requires cueing 10 - 24% of the time Memory Memory: 3-Recognizes or recalls 50 - 74% of the time/requires cueing 25 - 49% of the time FIM - Eating Eating Activity: 5: Supervision/cues  Pain Pain Assessment Pain Assessment: No/denies pain Pain Score: 0-No pain  Therapy/Group: Individual Therapy  Nancy NordmannChelsea Koreen Lizaola 01/19/2014, 11:00 AM

## 2014-01-19 NOTE — Progress Notes (Signed)
Physical Therapy Session Note  Patient Details  Name: Maurice Paul MRN: [id number] Date of Birth: 02/02/1946  Today's Date: 01/19/2014 Time: 1430-1500 Time Calculation (min): 30 min  Skilled Therapeutic Interventions/Progress Updates:  1:1. Pt received sitting in recliner, ready for therapy. Focus this session on memory, dual tasking, pathfinding and mental flexibility. Pt challenged to find Solarium to assess memory. Pt able to guide therapist from room<>Solarium w/ minimal use of signs and simultaneously challenged with tasks including carrying on conversation w/ therapist as well as naming food items that started with the letter therapist called out. Pt w/ mod difficulty finding words and min cues for redirection to pathfinding when stuck on cognitive task. Mental flexibility further challenged by having pt unscramble words or creating list of words from sting of random letters on board, req max verbal cues overall. Pt sitting in recliner at end of session w/ neuropsychologist and wife in room. Pt's sitter aware that neuropsychologist with pt.   Therapy Documentation Precautions:  Precautions Precautions: Fall Precaution Comments: gait belt for safety Restrictions Weight Bearing Restrictions: No  See FIM for current functional status  Therapy/Group: Individual Therapy  Denzil HughesCaroline S Skyah Hannon 01/19/2014, 3:50 PM

## 2014-01-19 NOTE — Progress Notes (Signed)
Recreational Therapy Session Note  Patient Details  Name: Maurice Paul MRN: [id number] Date of Birth: 10/19/1945 Today's Date: 01/19/2014  Pain: no c/o Skilled Therapeutic Interventions/Progress Updates: Session focused on community reintegration, activity tolerance, pathfinding & memory.  Pt given verbal instructions (3 steps) to locate Solarium. Pt able to immediately repeat 3 steps, but requires mod cues to use external aids to remember 1/3 steps (could recall 2/3 without assist). Functional ambulation >300' off unit, on various surfaces (tile, carpet, brick, concrete, etc.). Furniture transfers in [place] on low, cushioned surfaces with supervision. Discussion with pt about potential leisure activities & safety concerns post discharge.  Pt stated understanding.  Patient able to way find back to unit and his room with min cues. Pt is supervision overall for mobility.  Therapy/Group: Co-Treatment Clois DupesLisa B Finnlee Silvernail 01/19/2014, 4:37 PM

## 2014-01-19 NOTE — Progress Notes (Signed)
Subjective/Complaints: 68 y.o. right-handed male with history of tobacco abuse. Patient on no scheduled medications prior to admission. Admitted 12/24/2013 after a fall down approximately 8-10 steps and found to by his wife. No report of loss of consciousness. CT of the head showed subarachnoid hemorrhage over the high left parietal convexities and a small amount of subdural hemorrhage along the falx and tentorium without hydrocephalus or midline shift. CT cervical spine negative. Neurosurgery consulted Dr. Barnett AbuHenry Elsner with conservative care. Followup MRI of the brain 01/02/2014 showed persistent small volume of intracranial hemorrhage both subarachnoid and subdural no other acute abnormalities. Patient did require short intubation and was extubated 12/26/2013. EEG consistent with diffuse multifocal encephalopathy. Venous Doppler studies 01/07/2014 shows DVT left mid peroneal vein and Lovenox was initiated   Up at EOB. No complaints. Had a good weekend.  Objective: Vital Signs: Blood pressure 123/72, pulse 65, temperature 98.4 F (36.9 C), temperature source Oral, resp. rate 19, height 5\' 10"  (1.778 m), weight 127.6 kg (281 lb 4.9 oz), SpO2 90.00%. No results found. Results for orders placed during the hospital encounter of 01/08/14 (from the past 72 hour(s))  CBC     Status: Abnormal   Collection Time    01/18/14  6:44 AM      Result Value Ref Range   WBC 8.9  4.0 - 10.5 K/uL   RBC 3.94 (*) 4.22 - 5.81 MIL/uL   Hemoglobin 13.0  13.0 - 17.0 g/dL   HCT 21.339.8  08.639.0 - 57.852.0 %   MCV 101.0 (*) 78.0 - 100.0 fL   MCH 33.0  26.0 - 34.0 pg   MCHC 32.7  30.0 - 36.0 g/dL   RDW 46.913.8  62.911.5 - 52.815.5 %   Platelets 187  150 - 400 K/uL  GLUCOSE, CAPILLARY     Status: Abnormal   Collection Time    01/18/14  7:13 AM      Result Value Ref Range   Glucose-Capillary 126 (*) 70 - 99 mg/dL     HEENT: normal Cardio: RRR and no murmur Resp: CTA B/L and unlabored GI: BS positive and NT,ND Extremity:  No  Edema Skin:   Intact Neuro:   oriented to self and [place] Cranial Nerve II-XII normal, Abnormal Motor 4/5 in BUE and BLE. Thought processing improved but still tangential. Better insight and awareness. RLAS 6  Musc/Skel:  Normal Gen NAD   Assessment/Plan: 1. 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 Physiatrist is providing close team supervision and 24 hour management of active medical problems listed below. Physiatrist and rehab team continue to assess barriers to discharge/monitor patient progress toward functional and medical goals.  FIM: FIM - Bathing Bathing Steps Patient Completed: Chest;Right Arm;Left Arm;Abdomen;Front perineal area;Buttocks;Right upper leg;Left upper leg;Right lower leg (including foot);Left lower leg (including foot) Bathing: 5: Supervision: Safety issues/verbal cues  FIM - Upper Body Dressing/Undressing Upper body dressing/undressing steps patient completed: Thread/unthread right sleeve of pullover shirt/dresss;Thread/unthread left sleeve of pullover shirt/dress;Put head through opening of pull over shirt/dress;Pull shirt over trunk Upper body dressing/undressing: 5: Supervision: Safety issues/verbal cues FIM - Lower Body Dressing/Undressing Lower body dressing/undressing steps patient completed: Thread/unthread right underwear leg;Thread/unthread left underwear leg;Pull underwear up/down;Thread/unthread right pants leg;Thread/unthread left pants leg;Pull pants up/down;Fasten/unfasten pants;Don/Doff right shoe;Don/Doff left shoe Lower body dressing/undressing: 4: Min-Patient completed 75 plus % of tasks  FIM - Toileting Toileting steps completed by patient: Adjust clothing prior  to toileting;Performs perineal hygiene;Adjust clothing after toileting Toileting Assistive Devices: Grab bar or rail for support Toileting: 5:  Supervision: Safety issues/verbal cues  FIM - Diplomatic Services operational officerToilet Transfers Toilet Transfers Assistive Devices: Grab bars;Elevated toilet seat Toilet Transfers: 5-To toilet/BSC: Supervision (verbal cues/safety issues);5-From toilet/BSC: Supervision (verbal cues/safety issues)  FIM - Press photographerBed/Chair Transfer Bed/Chair Transfer Assistive Devices: Arm rests Bed/Chair Transfer: 5: Supine > Sit: Supervision (verbal cues/safety issues);5: Bed > Chair or W/C: Supervision (verbal cues/safety issues);5: Chair or W/C > Bed: Supervision (verbal cues/safety issues)  FIM - Locomotion: Wheelchair Distance: 15 Locomotion: Wheelchair: 0: Activity did not occur FIM - Locomotion: Ambulation Locomotion: Ambulation Assistive Devices:  (none) Ambulation/Gait Assistance: 5: Supervision Locomotion: Ambulation: 5: Travels 150 ft or more with supervision/safety issues  Comprehension Comprehension Mode: Auditory Comprehension: 3-Understands basic 50 - 74% of the time/requires cueing 25 - 50%  of the time  Expression Expression Mode: Verbal Expression: 4-Expresses basic 75 - 89% of the time/requires cueing 10 - 24% of the time. Needs helper to occlude trach/needs to repeat words.  Social Interaction Social Interaction: 3-Interacts appropriately 50 - 74% of the time - May be physically or verbally inappropriate.  Problem Solving Problem Solving: 2-Solves basic 25 - 49% of the time - needs direction more than half the time to initiate, plan or complete simple activities  Memory Memory: 2-Recognizes or recalls 25 - 49% of the time/requires cueing 51 - 75% of the time  Medical Problem List and Plan:  1. left-sided SDH/SAH after a fall  2. DVT Prophylaxis/Anticoagulation:. Venous Doppler of lower extremities 01/07/2014 shows left DVT mid peroneal vein. Lovenox stopped due to hematuria--he is ambulatory  -clots resolved per doppler thursday    3. Pain Management: Tylenol as needed  4. Mood: Seroquel 100 mg each bedtime to help  with sleep wake cycle.  -think we might be able to move to a bed alarm---dw team 5. Neuropsych: This patient is not capable of making decisions on his own behalf.  6. Dysphagia. Dysphagia 2 thins==tolerating well 7. Hypertension. Clonidine patch 0-1 mg weekly.  8. Tobacco abuse. Counseling  9. Klebsiella UTI. Keflex completed   LOS (Days) 11 A FACE TO FACE EVALUATION WAS PERFORMED  Ranelle OysterZachary T Linley Moxley 01/19/2014, 8:05 AM

## 2014-01-19 NOTE — Progress Notes (Signed)
The skilled treatment note has been reviewed and SLP is in agreement.  Vickee Mormino, M.A., CCC-SLP  [phone]   

## 2014-01-19 NOTE — Progress Notes (Signed)
Occupational Therapy Session Note  Patient Details  Name: Maurice Paul MRN: [id number] Date of Birth: Mar 07, 1946  Today's Date: 01/19/2014 Time: 0730-0830 Time Calculation (min): 60 min  Short Term Goals: Week 1:  OT Short Term Goal 1 (Week 1): Pt will complete toilet transfer min (A) with grab bar OT Short Term Goal 1 - Progress (Week 1): Met OT Short Term Goal 2 (Week 1): pt will complete shower transfer min (A) with bench OT Short Term Goal 2 - Progress (Week 1): Met OT Short Term Goal 3 (Week 1): Pt will follow 2 step simple command 3 out 4 trials OT Short Term Goal 3 - Progress (Week 1): Met OT Short Term Goal 4 (Week 1): Pt will complete bed mobility min guard (A) OT Short Term Goal 4 - Progress (Week 1): Met  Skilled Therapeutic Interventions/Progress Updates:    Pt seen for ADL retraining with focus on sequencing, attention, intellectual awareness, organization, and problem solving. Pt received sitting EOB waiting for breakfast however willing to participate in therapy at this time. Pt ambulated around room with no AD at supervision level to retrieve all clothing. Pt with no lob and required min cues for problem solving as pt attempting to place hanger on RW and it was continuously falling off. Required cues to find a different place to hang clothing. Pt completed bathing with min cues for problem solving to sit and wash bil feet as he was having difficulty doing this in standing. Pt was distracted throughout bathing requiring cues to determine if we washed all body parts as pt asked "did I do everything?" Completed dressing sitting EOB with therapist applying TED hose. At end of session pt left sitting EOB to eat breakfast with NT present.   Therapy Documentation Precautions:  Precautions Precautions: Fall Precaution Comments: gait belt for safety Restrictions Weight Bearing Restrictions: No General:   Vital Signs:   Pain: Pt with no report of pain during therapy  session.  See FIM for current functional status  Therapy/Group: Individual Therapy  Max Nuno N [name] 01/19/2014, 11:34 AM

## 2014-01-20 ENCOUNTER — Encounter (HOSPITAL_COMMUNITY): Payer: Medicare Other | Admitting: Occupational Therapy

## 2014-01-20 ENCOUNTER — Inpatient Hospital Stay (HOSPITAL_COMMUNITY): Payer: Medicare Other | Admitting: Speech Pathology

## 2014-01-20 ENCOUNTER — Inpatient Hospital Stay (HOSPITAL_COMMUNITY): Payer: Medicare Other | Admitting: *Deleted

## 2014-01-20 ENCOUNTER — Inpatient Hospital Stay (HOSPITAL_COMMUNITY): Payer: Medicare Other | Admitting: Occupational Therapy

## 2014-01-20 DIAGNOSIS — R1310 Dysphagia, unspecified: Secondary | ICD-10-CM

## 2014-01-20 DIAGNOSIS — S065X0A Traumatic subdural hemorrhage without loss of consciousness, initial encounter: Secondary | ICD-10-CM

## 2014-01-20 DIAGNOSIS — W108XXA Fall (on) (from) other stairs and steps, initial encounter: Secondary | ICD-10-CM

## 2014-01-20 DIAGNOSIS — S066X0A Traumatic subarachnoid hemorrhage without loss of consciousness, initial encounter: Secondary | ICD-10-CM

## 2014-01-20 NOTE — Consult Note (Signed)
  INITIAL DIAGNOSTIC EVALUATION - [facility]   MEDICAL NECESSITY:  [name] for an initial diagnostic evaluation owing to the patient's diagnosis of subdural hematoma.   According to medical records, Maurice Paul was admitted to the rehab unit owing to functional deficits post "left-sided SDH/SAH after a fall."  He has a reported history of tobacco abuse. He was admitted on 12/24/2013 after a fall down approximately 8-10 steps. Head CT scan reportedly revealed a subarachnoid hemorrhage over the high left parietal convexities and a small amount of subdural hemorrhage along the falx and tentorium without hydrocephalus or midline shift. Follow-up MRI of the brain revealed "persistent small volume of intracranial hemorrhage both subarachnoid and subdural no other acute abnormalities. EEG [was] consistent with diffuse multifocal encephalopathy." He has since exhibited bouts of delirium and agitation with Seroquel each bedtime.   During today's visit, Maurice Paul reported suffering from stable cognitive deficits secondary to a fall down a flight of steps. He hit his head on a piece of furniture and was purportedly knocked unconscious for a few minutes and suffered mild retrograde amnesia and significant posttraumatic amnesia (possibly up to 2 weeks). Since then, he experiences memory loss for recent information, decrease attention, and increased clumsiness. Multitasking, planning and organizing his thoughts are problematic. He now suffers double vision.   From an emotional standpoint, Maurice Paul admitted that he was somewhat "hostile" when he first came to the rehab unit because he did not understand what had happened and why he needed to be treated. He feels better now and has been in generally good spirits. He has no history of mental health treatment.    In regard to therapy, he feels that he is making strides  and mentioned that the staff on the unit has been great. He is happy to be discharging soon. No issues adjusting to this hospital stay were endorsed.   PROCEDURES ADMINISTERED: [1 unit [id number] on 01/19/14] Diagnostic clinical interview  Review of available records  Emotional & Behavioral Evaluation: Maurice Paul was appropriately dressed for season and situation, and he appeared tidy and well-groomed. Normal posture was noted. He was friendly and rapport was easily established. His speech was as expected and he was able to express ideas effectively. His affect was appropriately modulated. Attention and motivation were good.   From an emotional standpoint, Maurice Paul denied experiencing, and did not exhibit any major signs of clinical psychopathology. No adjustment issues reported. Suicidal/homicidal ideation, plan or intent was denied. No manic or hypomanic episodes were reported. The patient denied ever experiencing any auditory/visual hallucinations. No major behavioral or personality changes were endorsed.    Overall, Maurice Paul reported suffering from a significant degree of cognitive difficulty post head trauma. No mood symptoms endorsed. Given concern for impairment, cognitive screen was requested but given the patient's high level of premorbid functioning more comprehensive neuropsychological testing is warranted as an outpatient. This has been scheduled.   RECOMMENDATIONS    Complete a thorough neuropsychological evaluation upon discharge. This has been scheduled.       Debbe MountsAdam T. Perri Aragones, Psy.D.  Clinical Neuropsychologist

## 2014-01-20 NOTE — Progress Notes (Signed)
Speech Language Pathology Daily Session Note  Patient Details  Name: Maurice Paul MRN: [id number] Date of Birth: 02/21/1946  Today's Date: 01/20/2014 Time: [id number] Time Calculation (min): 40 min  Short Term Goals: Week 2: SLP Short Term Goal 1 (Week 2): Patient will complete basic problem solving tasks with Mod multimodal cues SLP Short Term Goal 2 (Week 2): Patient will utilize external aids to assist in recall of new and daily information with Mod multimodal cues.  SLP Short Term Goal 3 (Week 2): Patient will demonstrate selective attention for 30 minutes with supervision multimodal cues.  SLP Short Term Goal 4 (Week 2): Patient will demonstrate emergent awareness by self-monitoring and correcting errors with basic functional tasks with Mod A multimodal cues.  SLP Short Term Goal 5 (Week 2): Patient will utlize safe swallow strategies to minimize overt s/s of aspiration with current diet with supervision multimodal cues.   Skilled Therapeutic Interventions: Skilled therapeutic intervention addressed cognitive goals. Student provided Min multimodal cues to complete functional math activity and for anticipatory awareness during a discharge planning activity. Patient continues to have word finding difficulties and was educated on word finding strategies and was given a handout. Continue with current plan of care.    FIM:  Comprehension Comprehension Mode: Auditory Comprehension: 5-Follows basic conversation/direction: With extra time/assistive device Expression Expression Mode: Verbal Expression: 5-Expresses basic needs/ideas: With extra time/assistive device Social Interaction Social Interaction: 6-Interacts appropriately with others with medication or extra time (anti-anxiety, antidepressant). Problem Solving Problem Solving: 5-Solves basic 90% of the time/requires cueing < 10% of the time Memory Memory: 3-Recognizes or recalls 50 - 74% of the time/requires cueing 25 - 49% of the  time FIM - Eating Eating Activity: 0: Activity did not occur  Pain Pain Assessment Pain Assessment: No/denies pain Pain Score: 0-No pain  Therapy/Group: Individual Therapy  Nancy NordmannChelsea Blayn Whetsell 01/20/2014, 12:30 PM

## 2014-01-20 NOTE — Progress Notes (Signed)
Occupational Therapy Session Note  Patient Details  Name: Maurice Paul MRN: [id number] Date of Birth: 06/09/1946  Today's Date: 01/20/2014 Time: 1030-1130 Time Calculation (min): 60 min  Short Term Goals: Week 2:  OT Short Term Goal 1 (Week 2): STG=Unmet LTGs of overall Supervision  Skilled Therapeutic Interventions/Progress Updates:    1:1 self care retraining/ safety / dynamic standing balance: Pt beginning session with preservating on need to void bowels. Pt avoiding shower due to pending bowel movement. Pt states "I always shower after I go to the bathroom. I have to go first." pt gathering all clothing supervision. Pt ambulated to family room and made a coffee without cues. Pt drinking 3/4 cup of coffee and reattempting to void bowels. Pt turning on shower and returning to sitting on commode.pt unable to void and completing shower with extended time. Pt ending session in recliner with sitter and call bell with breakfast.  Second: 10:30-11:30 AM- Session focused on diplopia. Pt and wife educated on taping glasses for knocking out central vision to prevent diplopia. [name] on the nasal aspect for both eyes. Pt reporting seeing single in all quadrants with assessment wearing glasses. Pt educated on exercises on handout present in room from previous therapy sessions. Pt and wife completing exercises. Pt perseverating on new glasses and going to eye doctor. Pt and wife advised to allow extended time for eye strengthening with exercises and to speak to MD about appropriate follow up time frames. Pt and wife educated that typically it can take 3-6 months before such appointments could be appropriate however the doctor would be the best person to make these recommendations. [name] after discussion. Pt able to initiate task but unable to complete task without mod cueing. Wife cueing patient. Ot to further assess vision each session and taping of glasses. REcommend  outpatient follow up for diplopia   Therapy Documentation Precautions:  Precautions Precautions: Fall Precaution Comments: gait belt for safety Restrictions Weight Bearing Restrictions: No General:   Vital Signs: Therapy Vitals Temp: 98.2 F (36.8 C) Temp src: Oral Pulse Rate: 76 Resp: 20 BP: 117/73 mmHg Patient Position, if appropriate: Sitting Oxygen Therapy SpO2: 92 % O2 Device: None (Room air) Pain: Pain Assessment Pain Assessment: No/denies pain ADL: ADL ADL Comments: see FIM Exercises:   Other Treatments:    See FIM for current functional status  Therapy/Group: Individual Therapy  Harolyn RutherfordJessica B Latavious Bitter 01/20/2014, 3:22 PM Pager: [phone]

## 2014-01-20 NOTE — Progress Notes (Signed)
The skilled treatment note has been reviewed and SLP is in agreement.  Hezikiah Retzloff, M.A., CCC-SLP  [phone]   

## 2014-01-20 NOTE — Progress Notes (Signed)
Speech Language Pathology Note  Patient Details  Name: Jewel BaizeWalter S Reckner MRN: [id number] Date of Birth: 08/17/1946 Today's Date: 01/20/2014  This clinician spent ~40 minutes while patient was not present educating patient's wife in regards to current cognitive-linguistic function and strategies to utilize at home to increase working memory, problem solving, attention, awareness, safety and word-finding.  Patient's wife also educated on patient's current swallowing function, diet recommendations, appropriate textures, how to administer medications and swallowing compensatory strategies. She took notes throughout education and handouts were also given to reinforce information. Pt's wife verbalized understanding of all information presented.   Huston FoleyCourtney M Darlene Brozowski 01/20/2014, 11:36 AM   Feliberto Gottronourtney Matisha Termine, MA, CCC-SLP [phone]

## 2014-01-20 NOTE — Progress Notes (Signed)
Physical Therapy Session Note  Patient Details  Name: Maurice Paul MRN: [id number] Date of Birth: 12/27/1945  Today's Date: 01/20/2014 Time: 0930-1030 and 1330-1415 Time Calculation (min): 60 min and 45 min  Short Term Goals: Week 2:  PT Short Term Goal 1 (Week 2): STGs=LTGs of overall supervision  Skilled Therapeutic Interventions/Progress Updates:    AM Session: Patient received sitting in recliner. Session focused on functional mobility and safety awareness throughout transitional movements. Gaze stabilization exercises in seated with horizontal VOR, slowly increasing size of horizontal movement and pace when appropriate; patient reporting double vision only in inferior aspect of field of vision. Functional ambulation >200' x2 in controlled and home environments (on tile and carpet) with supervision, no overt LOB, cues for safe negotiation of obstacles in environment. Stair negotiation x12 stairs with one handrail and supervision, cues for step to pattern to promote appropriate pacing.  Discussion/education about falls risk at home and indications/contraindications for attempting floor transfer vs. Calling EMS s/p fall. Patient verbalized understanding and able to complete floor transfer with use of mat for UE support with supervision/cues for sequencing. Bed mobility on regular/flat bed with supervision/cues for improved efficiency. NuStep Level 6 x5' and Level 4 x5' with B UE/LE to improve activity tolerance. Patient returned to room and left seated in recliner with sitter and wife present for supervision.  PM Session: Patient received sitting in recliner. Session focused on functional mobility and BERG Balance Test; see details below. Functional ambulation room<>gym, 41>150' x2 with supervision. Car transfer to car of sedan height with supervision, cues for sequencing to perform stand pivot to sit instead of SLS to get into car. Patient left sitting in recliner with sitter present.  Therapy  Documentation Precautions:  Precautions Precautions: Fall Restrictions Weight Bearing Restrictions: No Pain: Pain Assessment Pain Assessment: No/denies pain Pain Score: 0-No pain Locomotion : Ambulation Ambulation/Gait Assistance: 5: Supervision  Balance: Balance Balance Assessed: Yes Standardized Balance Assessment Standardized Balance Assessment: Berg Balance Test Berg Balance Test Sit to Stand: Able to stand without using hands and stabilize independently Standing Unsupported: Able to stand safely 2 minutes Sitting with Back Unsupported but Feet Supported on Floor or Stool: Able to sit safely and securely 2 minutes Stand to Sit: Sits safely with minimal use of hands Transfers: Able to transfer safely, definite need of hands Standing Unsupported with Eyes Closed: Able to stand 10 seconds with supervision Standing Ubsupported with Feet Together: Able to place feet together independently and stand for 1 minute with supervision From Standing, Reach Forward with Outstretched Arm: Can reach confidently >25 cm (10") From Standing Position, Pick up Object from Floor: Able to pick up shoe, needs supervision From Standing Position, Turn to Look Behind Over each Shoulder: Looks behind one side only/other side shows less weight shift Turn 360 Degrees: Able to turn 360 degrees safely one side only in 4 seconds or less Standing Unsupported, Alternately Place Feet on Step/Stool: Able to stand independently and safely and complete 8 steps in 20 seconds Standing Unsupported, One Foot in Front: Able to take small step independently and hold 30 seconds Standing on One Leg: Able to lift leg independently and hold equal to or more than 3 seconds Total Score: 46/56, indicating patient is at a moderate risk for falls (>50%).  See FIM for current functional status  Therapy/Group: Individual Therapy  Chipper HerbBridget S Eythan Jayne S. Keilany Burnette, PT, DPT 01/20/2014, 2:00 PM

## 2014-01-20 NOTE — Progress Notes (Signed)
Subjective/Complaints: 68 y.o. right-handed male with history of tobacco abuse. Patient on no scheduled medications prior to admission. Admitted 12/24/2013 after a fall down approximately 8-10 steps and found to by his wife. No report of loss of consciousness. CT of the head showed subarachnoid hemorrhage over the high left parietal convexities and a small amount of subdural hemorrhage along the falx and tentorium without hydrocephalus or midline shift. CT cervical spine negative. Neurosurgery consulted Dr. Barnett AbuHenry Elsner with conservative care. Followup MRI of the brain 01/02/2014 showed persistent small volume of intracranial hemorrhage both subarachnoid and subdural no other acute abnormalities. Patient did require short intubation and was extubated 12/26/2013. EEG consistent with diffuse multifocal encephalopathy. Venous Doppler studies 01/07/2014 shows DVT left mid peroneal vein and Lovenox was initiated   No new issues. Pleasant. No complaints.   Objective: Vital Signs: Blood pressure 130/78, pulse 79, temperature 98.3 F (36.8 C), temperature source Oral, resp. rate 18, height 5\' 10"  (1.778 m), weight 127.6 kg (281 lb 4.9 oz), SpO2 93.00%. No results found. Results for orders placed during the hospital encounter of 01/08/14 (from the past 72 hour(s))  CBC     Status: Abnormal   Collection Time    01/18/14  6:44 AM      Result Value Ref Range   WBC 8.9  4.0 - 10.5 K/uL   RBC 3.94 (*) 4.22 - 5.81 MIL/uL   Hemoglobin 13.0  13.0 - 17.0 g/dL   HCT 16.139.8  09.639.0 - 04.552.0 %   MCV 101.0 (*) 78.0 - 100.0 fL   MCH 33.0  26.0 - 34.0 pg   MCHC 32.7  30.0 - 36.0 g/dL   RDW 40.913.8  81.111.5 - 91.415.5 %   Platelets 187  150 - 400 K/uL  GLUCOSE, CAPILLARY     Status: Abnormal   Collection Time    01/18/14  7:13 AM      Result Value Ref Range   Glucose-Capillary 126 (*) 70 - 99 mg/dL     HEENT: normal Cardio: RRR and no murmur Resp: CTA B/L and unlabored GI: BS positive and NT,ND Extremity:  No Edema Skin:    Intact Neuro:   oriented to self and [place] Cranial Nerve II-XII normal, Abnormal Motor 4/5 in BUE and BLE. Thought processing improved but still tangential. Better insight and awareness. RLAS 6  Musc/Skel:  Normal Gen NAD   Assessment/Plan: 1. 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 Physiatrist is providing close team supervision and 24 hour management of active medical problems listed below. Physiatrist and rehab team continue to assess barriers to discharge/monitor patient progress toward functional and medical goals.  FIM: FIM - Bathing Bathing Steps Patient Completed: Chest;Right Arm;Left Arm;Abdomen;Front perineal area;Buttocks;Right upper leg;Left upper leg;Right lower leg (including foot);Left lower leg (including foot) Bathing: 5: Supervision: Safety issues/verbal cues  FIM - Upper Body Dressing/Undressing Upper body dressing/undressing steps patient completed: Thread/unthread right sleeve of pullover shirt/dresss;Thread/unthread left sleeve of pullover shirt/dress;Put head through opening of pull over shirt/dress;Pull shirt over trunk Upper body dressing/undressing: 5: Supervision: Safety issues/verbal cues FIM - Lower Body Dressing/Undressing Lower body dressing/undressing steps patient completed: Thread/unthread right underwear leg;Thread/unthread left underwear leg;Pull underwear up/down;Thread/unthread right pants leg;Thread/unthread left pants leg;Pull pants up/down;Fasten/unfasten pants;Don/Doff right shoe;Don/Doff left shoe Lower body dressing/undressing: 5: Set-up assist to: Don/Doff TED stocking  FIM - Toileting Toileting steps completed by patient: Adjust clothing prior to toileting;Performs perineal  hygiene;Adjust clothing after toileting Toileting Assistive Devices: Grab bar or rail for support Toileting: 5: Supervision:  Safety issues/verbal cues  FIM - Diplomatic Services operational officerToilet Transfers Toilet Transfers Assistive Devices: Grab bars;Elevated toilet seat Toilet Transfers: 5-To toilet/BSC: Supervision (verbal cues/safety issues);5-From toilet/BSC: Supervision (verbal cues/safety issues)  FIM - Press photographerBed/Chair Transfer Bed/Chair Transfer Assistive Devices: Arm rests Bed/Chair Transfer: 5: Bed > Chair or W/C: Supervision (verbal cues/safety issues);5: Chair or W/C > Bed: Supervision (verbal cues/safety issues)  FIM - Locomotion: Wheelchair Distance: 15 Locomotion: Wheelchair: 0: Activity did not occur FIM - Locomotion: Ambulation Locomotion: Ambulation Assistive Devices: Other (comment) (none) Ambulation/Gait Assistance: 5: Supervision Locomotion: Ambulation: 5: Travels 150 ft or more with supervision/safety issues  Comprehension Comprehension Mode: Auditory Comprehension: 5-Understands basic 90% of the time/requires cueing < 10% of the time  Expression Expression Mode: Verbal Expression: 5-Expresses basic 90% of the time/requires cueing < 10% of the time.  Social Interaction Social Interaction: 4-Interacts appropriately 75 - 89% of the time - Needs redirection for appropriate language or to initiate interaction.  Problem Solving Problem Solving: 4-Solves basic 75 - 89% of the time/requires cueing 10 - 24% of the time  Memory Memory: 3-Recognizes or recalls 50 - 74% of the time/requires cueing 25 - 49% of the time  Medical Problem List and Plan:  1. left-sided SDH/SAH after a fall  2. DVT Prophylaxis/Anticoagulation:. Venous Doppler of lower extremities 01/07/2014 shows left DVT mid peroneal vein. Lovenox stopped due to hematuria--he is ambulatory  -clots resolved per doppler thursday    3. Pain Management: Tylenol as needed  4. Mood: Seroquel 100 mg each bedtime to help with sleep wake cycle.  -think we might be able to move to a bed alarm---dw team today 5. Neuropsych: This patient is not capable of making decisions  on his own behalf.  6. Dysphagia. Dysphagia 3 thins==tolerating well 7. Hypertension. Clonidine patch 0-1 mg weekly.  8. Tobacco abuse. Counseling  9. Klebsiella UTI. Keflex completed   LOS (Days) 12 A FACE TO FACE EVALUATION WAS PERFORMED  Maurice Paul 01/20/2014, 8:06 AM

## 2014-01-21 ENCOUNTER — Inpatient Hospital Stay (HOSPITAL_COMMUNITY): Payer: Medicare Other | Admitting: Occupational Therapy

## 2014-01-21 ENCOUNTER — Inpatient Hospital Stay (HOSPITAL_COMMUNITY): Payer: Medicare Other | Admitting: *Deleted

## 2014-01-21 ENCOUNTER — Inpatient Hospital Stay (HOSPITAL_COMMUNITY): Payer: Medicare Other | Admitting: Speech Pathology

## 2014-01-21 ENCOUNTER — Encounter (HOSPITAL_COMMUNITY): Payer: Medicare Other | Admitting: Occupational Therapy

## 2014-01-21 DIAGNOSIS — S065X0A Traumatic subdural hemorrhage without loss of consciousness, initial encounter: Secondary | ICD-10-CM

## 2014-01-21 DIAGNOSIS — S066X0A Traumatic subarachnoid hemorrhage without loss of consciousness, initial encounter: Secondary | ICD-10-CM

## 2014-01-21 DIAGNOSIS — R1310 Dysphagia, unspecified: Secondary | ICD-10-CM

## 2014-01-21 DIAGNOSIS — W108XXA Fall (on) (from) other stairs and steps, initial encounter: Secondary | ICD-10-CM

## 2014-01-21 NOTE — Patient Care Conference (Signed)
Inpatient RehabilitationTeam Conference and Plan of Care Update Date: 01/20/2014   Time: 3:00 PM    Patient Name: Maurice Paul      Medical Record Number: [id number]  Date of Birth: 11/02/1945 Sex: Male         Room/Bed: 4W16C/4W16C-01 Payor Info: Payor: [employer] [place] [employer]ICARE / Plan: BLUE MEDICARE / Product Type: *No Product type* /    Admitting Diagnosis: SAH  SDH RANCHO V   Admit Date/Time:  01/08/2014  3:11 PM Admission Comments: No comment available   Primary Diagnosis:  <principal problem not specified> Principal Problem: <principal problem not specified>  Patient Active Problem List   Diagnosis Date Noted  . SDH (subdural hematoma) 01/08/2014  . DVT (deep venous thrombosis) 01/07/2014  . HTN (hypertension) 12/30/2013  . Acute respiratory failure with hypoxia and hypercarbia 12/25/2013  . Metabolic encephalopathy 12/25/2013  . Morbid obesity 12/25/2013  . SAH (subarachnoid hemorrhage) 12/24/2013    Expected Discharge Date: Expected Discharge Date: 01/22/14  Team Members Present: Physician leading conference: Dr. Faith RogueZachary Swartz Social Worker Present: Amada JupiterLucy Kymia Simi, LCSW Nurse Present: Keturah BarreEd Knisley, RN PT Present: Cyndia SkeetersBridgett Ripa, PT OT Present: Ardis Rowanom Lanier, Corky CraftsOTA;Karen [place]ask[place], OT;Other (comment) Mateo Flow(Brynn Jones, OT) SLP Present: Feliberto Gottronourtney Payne, SLP PPS Coordinator present : Tora DuckMarie Noel, RN, CRRN;Becky Henrene DodgeWindsor, PT     Current Status/Progress Goal Weekly Team Focus  Medical   improve cognitiion, still requires supervision. sitter in room  see prior  ongoing rx, education, safety   Bowel/Bladder   Incontinent episodes of bladder, continent of bladder LBM 4/11   Continent of bowel and bladder with mod A  Timed toileting    Swallow/Nutrition/ Hydration   Dys. 3 textures with thin liquids via straw with supervision for use of safe swallow strategies  Supervision for use of safe swallow strategies  diet tolerance   ADL's   supervision level for ADLS  supervision level   dynamic standing balance/ diplopia taping glasses and exercises   Mobility   S overall for mobility, gait >150', full flight of stairs; at goal level  supervision overall  safety, cognitive remediation, functional mobility, activity tolerance, strengthening   Communication             Safety/Cognition/ Behavioral Observations  Min for basic problem solving and selective attention in a distracting environment  Min A for problem solving and  supervision for attention  basic problem solving, family education   Pain   Denies pain   Pain <3/10  assess pain periodically throghout sift   Skin   BLE discoloration   skin remain free of any new breakdown  assess skin ever shift     Rehab Goals Patient on target to meet rehab goals: Yes *See Care Plan and progress notes for long and short-term goals.  Barriers to Discharge: see prior    Possible Resolutions to Barriers:  supervision goals    Discharge Planning/Teaching Needs:  home with wife to provide 24/7 supervision      Team Discussion:  Reaching supervision goals.  Good improvement overall.  Education being completed with wife and each therapy this week.  No other concerns.  Revisions to Treatment Plan:  None   Continued Need for Acute Rehabilitation Level of Care: The patient requires daily medical management by a physician with specialized training in physical medicine and rehabilitation for the following conditions: Daily direction of a multidisciplinary physical rehabilitation program to ensure safe treatment while eliciting the highest outcome that is of practical value to  the patient.: Yes Daily medical management of patient stability for increased activity during participation in an intensive rehabilitation regime.: Yes Daily analysis of laboratory values and/or radiology reports with any subsequent need for medication adjustment of medical intervention for : Neurological problems;Post surgical problems;Pulmonary  problems  Amada JupiterLucy Paden Senger 01/21/2014, 11:04 AM

## 2014-01-21 NOTE — Plan of Care (Signed)
Problem: RH Wheelchair Mobility Goal: LTG Patient will propel w/c in controlled environment (PT) LTG: Patient will propel wheelchair in controlled environment, # of feet with assist (PT)  Outcome: Not Applicable Date Met:  88/11/03 Patient to discharge at ambulatory level.

## 2014-01-21 NOTE — Progress Notes (Signed)
Speech Language Pathology Discharge Summary  Patient Details  Name: Maurice Paul MRN: [id number] Date of Birth: Jan 22, 1946  Today's Date: 01/21/2014 Time: 1440-1520 Time Calculation (min): 40 min  Skilled Therapeutic Interventions:  Skilled treatment session focused on addressing cognition with new learning task.  SLP facilitated session with verbal directions and Min verbal cues for working memory throughout task to recall procedures to accurately complete task.    Patient has met 6 of 6 long term goals.  Patient to discharge at overall Supervision;Min level.  Reasons goals not met: n/a   Clinical Impression/Discharge Summary: Patient has made excellent gains and has mas met 6 of 6 long term goals during his rehab stay due to increased attention, problem solving, awareness, memory and swallowing function. Currently, patient is demonstrating behaviors with a Rancho Level VII-VIII and requires overall Supervision-Min assist with basic functional problem solving, working memory with utilization of compensatory strategies, awareness, attention and safety awareness. Patient is also currently consuming Dys. 3 textures with thin liquids with Supervision assist for utilization of swallowing compensatory strategies. Patient/family education completed 4/14 (see note for details).  Patient and family ready for discharge.  Given that patient continues to require 24/7 supervision continued SLP services in an outpatient setting is recommended for follow-up.   Care Partner:  Caregiver Able to Provide Assistance: Yes  Type of Caregiver Assistance: Cognitive  Recommendation:  Outpatient SLP;24 hour supervision/assistance  Rationale for SLP Follow Up: Maximize cognitive function and independence;Reduce caregiver burden   Equipment: none   Reasons for discharge: Treatment goals met;Discharged from hospital   Patient/Family Agrees with Progress Made and Goals Achieved: Yes   See FIM for current functional  status  Carmelia Roller., CCC-SLP [place] 01/21/2014, 4:48 PM

## 2014-01-21 NOTE — Discharge Instructions (Signed)
Inpatient Rehab Discharge Instructions  Maurice BaizeWalter S Paul Discharge date and time: No discharge date for patient encounter.   Activities/Precautions/ Functional Status: Activity: activity as tolerated Diet: soft diet Wound Care: none needed Functional status:  ___ No restrictions     ___ Walk up steps independently _x__ 24/7 supervision/assistance   ___ Walk up steps with assistance ___ Intermittent supervision/assistance  ___ Bathe/dress independently ___ Walk with walker     ___ Bathe/dress with assistance ___ Walk Independently    ___ Shower independently _x__ Walk with assistance    ___ Shower with assistance ___ No alcohol     ___ Return to work/school ________   COMMUNITY REFERRALS UPON DISCHARGE:    Outpatient: PT     OT    ST                   Agency: [facility]    Phone: [phone]               Appointment Date/Time:  4/17 11:00 (arrive 10:30)                                                                 4/22 2:45 - 4:15                                                                 4/27 11:00 - 12:30 pm   GENERAL COMMUNITY RESOURCES FOR PATIENT/FAMILY:  Support Groups: Brain Injury Support Group        Special Instructions:    My questions have been answered and I understand these instructions. I will adhere to these goals and the provided educational materials after my discharge from the hospital.  Patient/Caregiver Signature _______________________________ Date __________  Clinician Signature _______________________________________ Date __________  Please bring this form and your medication list with you to all your follow-up doctor's appointments.

## 2014-01-21 NOTE — Progress Notes (Signed)
Recreational Therapy Discharge Summary Patient Details  Name: Maurice Paul MRN: [id number] Date of Birth: May 31, 1946 Today's Date: 01/21/2014  Long term goals set: 1  Long term goals met: 1  Comments on progress toward goals: Pt has made great progress toward goal and is scheduled for discharge home tomorrow with wife to provide/coordinate 24 hour care.  Pt is discharging at supervision level for simple TR tasks, cuing needed for decreased cognition.  Pt's wife has been present & participatory in therapies throughout LOS. Reasons for discharge: discharge from hospital Patient/family agrees with progress made and goals achieved: Yes  Waldon Reining 01/21/2014, 9:54 AM

## 2014-01-21 NOTE — Progress Notes (Signed)
Subjective/Complaints: 68 y.o. right-handed male with history of tobacco abuse. Patient on no scheduled medications prior to admission. Admitted 12/24/2013 after a fall down approximately 8-10 steps and found to by his wife. No report of loss of consciousness. CT of the head showed subarachnoid hemorrhage over the high left parietal convexities and a small amount of subdural hemorrhage along the falx and tentorium without hydrocephalus or midline shift. CT cervical spine negative. Neurosurgery consulted Dr. Barnett AbuHenry Elsner with conservative care. Followup MRI of the brain 01/02/2014 showed persistent small volume of intracranial hemorrhage both subarachnoid and subdural no other acute abnormalities. Patient did require short intubation and was extubated 12/26/2013. EEG consistent with diffuse multifocal encephalopathy. Venous Doppler studies 01/07/2014 shows DVT left mid peroneal vein and Lovenox was initiated   Had a reasonable night. Denies pain. Excited to be going home tomorrow    Objective: Vital Signs: Blood pressure 121/74, pulse 74, temperature 98.3 F (36.8 C), temperature source Oral, resp. rate 17, height 5\' 10"  (1.778 m), weight 127.6 kg (281 lb 4.9 oz), SpO2 93.00%. No results found. No results found for this or any previous visit (from the past 72 hour(s)).   HEENT: normal Cardio: RRR and no murmur Resp: CTA B/L and unlabored GI: BS positive and NT,ND Extremity:  No Edema Skin:   Intact Neuro:   oriented to self and [place] Cranial Nerve II-XII normal, Abnormal Motor 4/5 in BUE and BLE. Thought processing improved but still tangential. Better insight and awareness. RLAS 6  Musc/Skel:  Normal Gen NAD   Assessment/Plan: 1. 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 Physiatrist is providing close team supervision and 24 hour management of active  medical problems listed below. Physiatrist and rehab team continue to assess barriers to discharge/monitor patient progress toward functional and medical goals.  FIM: FIM - Bathing Bathing Steps Patient Completed: Chest;Right Arm;Left Arm;Abdomen;Front perineal area;Buttocks;Right upper leg;Left upper leg;Right lower leg (including foot);Left lower leg (including foot) Bathing: 5: Supervision: Safety issues/verbal cues  FIM - Upper Body Dressing/Undressing Upper body dressing/undressing steps patient completed: Thread/unthread right sleeve of pullover shirt/dresss;Thread/unthread left sleeve of pullover shirt/dress;Put head through opening of pull over shirt/dress;Pull shirt over trunk Upper body dressing/undressing: 6: Assistive device (Comment) FIM - Lower Body Dressing/Undressing Lower body dressing/undressing steps patient completed: Thread/unthread right underwear leg;Thread/unthread left underwear leg;Pull underwear up/down;Thread/unthread right pants leg;Pull pants up/down;Thread/unthread left pants leg Lower body dressing/undressing: 5: Supervision: Safety issues/verbal cues (don shoes but they are slip on)  FIM - Toileting Toileting steps completed by patient: Adjust clothing prior to toileting;Performs perineal hygiene;Adjust clothing after toileting Toileting Assistive Devices: Grab bar or rail for support Toileting: 5: Supervision: Safety issues/verbal cues  FIM - Diplomatic Services operational officerToilet Transfers Toilet Transfers Assistive Devices: Grab bars Toilet Transfers: 5-To toilet/BSC: Supervision (verbal cues/safety issues);5-From toilet/BSC: Supervision (verbal cues/safety issues)  FIM - Press photographerBed/Chair Transfer Bed/Chair Transfer Assistive Devices: Arm rests Bed/Chair Transfer: 5: Supine > Sit: Supervision (verbal cues/safety issues);5: Sit > Supine: Supervision (verbal cues/safety issues);5: Bed > Chair or W/C: Supervision (verbal cues/safety issues);5: Chair or W/C > Bed: Supervision (verbal cues/safety  issues)  FIM - Locomotion: Wheelchair Distance: 15 Locomotion: Wheelchair: 0: Activity did not occur FIM - Locomotion: Ambulation Locomotion: Ambulation Assistive Devices: Other (comment) (none) Ambulation/Gait Assistance: 5: Supervision Locomotion: Ambulation: 5: Travels 150 ft or more with supervision/safety issues  Comprehension Comprehension Mode: Auditory Comprehension: 5-Follows basic conversation/direction: With extra time/assistive device  Expression Expression Mode: Verbal Expression: 5-Expresses basic needs/ideas: With extra time/assistive device  Social Interaction Social Interaction: 6-Interacts appropriately with others with medication or extra time (anti-anxiety, antidepressant).  Problem Solving Problem Solving: 5-Solves basic 90% of the time/requires cueing < 10% of the time  Memory Memory: 3-Recognizes or recalls 50 - 74% of the time/requires cueing 25 - 49% of the time  Medical Problem List and Plan:  1. left-sided SDH/SAH after a fall  2. DVT Prophylaxis/Anticoagulation:. Venous Doppler of lower extremities 01/07/2014 shows left DVT mid peroneal vein. Lovenox stopped due to hematuria--he is ambulatory  -clots resolved per doppler Thursday---no follow up at this time as he's ambulatory    3. Pain Management: Tylenol as needed  4. Mood: Seroquel 100 mg each bedtime---would continue upon discharge. Titrate as needed  -will continue sitter as he's leaving tomorrow 5. Neuropsych: This patient is not capable of making decisions on his own behalf.  6. Dysphagia. Dysphagia 3 thins==tolerating well 7. Hypertension. Clonidine patch 0-1 mg weekly.  8. Tobacco abuse. Counseling  9. Klebsiella UTI. Keflex completed   LOS (Days) 13 A FACE TO FACE EVALUATION WAS PERFORMED  Ranelle OysterZachary T Talya Quain 01/21/2014, 8:13 AM

## 2014-01-21 NOTE — Discharge Summary (Signed)
NAMJinger Neighbors:  Ha, Miraj                ACCOUNT NO.:  [id number]  MEDICAL RECORD NO.:  [id number]  LOCATION:  [id number]                        FACILITY:  [facility]  PHYSICIAN:  Mariam Dollaraniel Liisa Picone, P.A.  DATE OF BIRTH:  09/18/1946  DATE OF ADMISSION:  01/08/2014 DATE OF DISCHARGE:  01/22/2014                              DISCHARGE SUMMARY   DISCHARGE DIAGNOSES: 1. Left-sided subdural hematoma-subarachnoid hemorrhage after a fall. 2. Sequential compression devices for DVT prophylaxis. 3. Pain management. 4. Mood. 5. Dysphagia. 6. Hypertension. 7. Tobacco abuse. 8. Klebsiella urinary tract infection. 9. Left deep venous thrombosis mid peroneal vein resolved after follow     up venous Doppler studies on January 15, 2014.  HISTORY OF PRESENT ILLNESS:  This is a 68 year old right-handed male with history of tobacco abuse, on no scheduled medications, was admitted on December 24, 2013, after a fall down approximately 8-10 steps and found by his wife with positive loss of conscious.  Cranial CT scan of the head showed subarachnoid hemorrhage over the high left parietal convexities and a small amount of subdural hemorrhage along the falx without hydrocephalus or midline shift.  CT of cervical spine negative. Neurosurgery Dr. Danielle DessElsner was consulted and advised conservative care. Followup MRI of the brain on January 02, 2014, showed persistence small volume of intracranial hemorrhage both subarachnoid, subdural, and no other acute abnormalities.  The patient did require short intubation, was extubated on December 26, 2013.  EEG consistent with diffuse multifocal encephalopathy.  Venous Doppler studies for January 07, 2014, showed DVT left mid peroneal vein and Lovenox initiated after being cleared by Neurosurgery.  Maintained on a dysphagia 1 honey thick liquid diet. Klebsiella urinary tract infection placed on Keflex.  Bouts of delirium and agitation receiving Seroquel.  Physical and occupational therapy ongoing.  The  patient was admitted for comprehensive rehab program.  PAST MEDICAL HISTORY:  See discharge diagnoses.  SOCIAL HISTORY:  Lives with spouse.  FUNCTIONAL HISTORY:  Prior to admission independent.  FUNCTIONAL STATUS:  Upon admission to rehab services was +1 total assist for lower body bathing, dressing, and mid mod assist mobility.  PHYSICAL EXAMINATION:  VITAL SIGNS:  Blood pressure 143/64, pulse 62, temperature 98, respirations 20. GENERAL:  This was a lethargic male, in no acute distress.  He was able to provide his name, restraints were in place for safety, moves all extremities. HEENT:  Pupils were round and reactive to light. LUNGS:  Clear to auscultation. CARDIAC:  Regular rate and rhythm. ABDOMEN:  Soft, nontender.  Good bowel sounds.  REHABILITATION/HOSPITAL COURSE:  The patient was admitted to inpatient rehab services with therapies initiated on a 3-hour daily basis consisting of physical therapy, occupational therapy, speech therapy, and rehabilitation nursing.  The following issues were addressed during the patient's rehabilitation stay.  Pertaining to Mr. Osowski's left-sided subdural hematoma and  subarachnoid hemorrhage after fall remained stable with conservative care followed by Neurosurgery, Dr. Danielle DessElsner. Venous Doppler studies for January 07, 2014, showed a left DVT mid peroneal vein.  Lovenox had been initiated at that time after being cleared by Neurosurgery and later held due to some hematuria.  He was now ambulatory.  Follow up venous Doppler studies on January 15, 2014, showed no evidence of DVT.  He remained on Seroquel at bedtime.  His mood and demeanor continued to improve.  He is participating fully with his therapies.  He did have a bed alarm in place for safety.  His diet was advanced to a mechanical soft thin liquid, which he tolerated nicely. His blood pressures were controlled with a clonidine patch.  He would follow up with his primary MD.  The patient  received weekly collaborative interdisciplinary team conferences to discuss estimated length of stay, family teaching, and any barriers to discharge.  He was ambulating greater than 200 feet x2, and a controlled on home environment on tile as well as carpet with supervision.  No overt loss of balance.  He did some cues for safe negotiation of obstacles in the environment.  Car transfers with supervision.  The patient was able to gather his belongings for activities of daily living.  He was followed by speech therapy for cognition and needed minimum multimodal cues to complete functional math activities and for anticipatory awareness during discharge planning.  He continued to have some word-finding difficulties.  He could express his needs.  Again, it was advised the need for supervision at home for patient's safety.  DISCHARGE MEDICATIONS:  Tylenol as needed, clonidine patch 0.1 mg every 7 days, multivitamin daily, Seroquel 100 mg p.o. at bedtime.  DIET:  His diet was mechanical soft thin liquids.  DISCHARGE FOLLOWUP:  He would follow up with Dr. Faith RogueZachary Swartz at the outpatient rehab service office on March 18, 2014, Dr. Barnett AbuHenry Elsner, Neurosurgery called for appointment.  Arrangements were to be made with follow up with the PCP by case manager.     Mariam Dollaraniel Aiyah Scarpelli, P.A.     DA/MEDQ  D:  01/21/2014  T:  01/21/2014  Job:  [id number]  cc:   Stefani DamaHenry J. Elsner, M.D.

## 2014-01-21 NOTE — Progress Notes (Signed)
Occupational Therapy Discharge Summary  Patient Details  Name: Maurice Paul MRN: [id number] Date of Birth: [birth year]  Today's Date: 01/21/2014 Time: 0700-0800 Time Calculation (min): 60 min  Patient has met 9 of 9 long term goals due to improved activity tolerance, improved balance, ability to compensate for deficits, improved attention and improved awareness.  Patient to discharge at overall Supervision level.  Patient's care partner is independent to provide the necessary cognitive assistance at discharge.    Pt demonstrates [place] Recovery level VIII currently. Pt ambulate to complete basic transfers Supervision. Wife educated to provide very structured environment to maximize independence at home.  Reasons goals not met: Pt's wife will complete all laundry needs and has addressed all needs during hospitalization. Goal disconnected  Recommendation:  Patient will benefit from ongoing skilled OT services in outpatient setting to continue to advance functional skills in the area of BADL, Reduce care partner burden and cognitive remediation/ address diplopia.  Equipment: none  Reasons for discharge: treatment goals met and discharge from hospital  Patient/family agrees with progress made and goals achieved: Yes  7:00-8:00- Self care retraining and diplopia exercises: Pt eager to engage in OT session on arrival and reading schedule to verbalize expectation of session. Pt gathered all clothing from memory without cues. Pt organized adl items in bathroom, starting water, adjust water to proper temperature , and alternating standing/sitting using grab bar. Pt sitting on toilet to don underwear. Pt exiting bathroom to dress and perform grooming task. Pt sitting in chair with min cueing for diplopia vision exercises with glasses wearing tape on lens. Pt reading handout and needing cues to complete task correctly. Additional tape added to lens and now reports only single vision in lower Rt LT  quadrants for bil eyes. Pt ending session MOD I eating breakfast.  10:30-11:15- Pt and wife present in room on arrival. Session focused on using compensatory strategy of taping glasses to complete cognitive remediation. Pt present with "mastermind" game. Pt demonstrated problem solving, reasoning, sequencing and self correcting during task. Pt participated as the mentor and the active game participate this session. Wife present and helping engage in task. Pt ambulated back to room and provided questioning cue to initiate diplopia handout worksheet. Pt demonstrates exercises with min cueing. Pt ending session in chair with all needs in reach. ( sitter/ wife present)  13:00-14:00- Pt and wife ambulated to dayroom to engage in visual scanning, problem solving, self correcting and use of glasses with taped lens. Pt has successful demonstrated an entire day of therapy with no reports of diplopia while using glasses. Pt started puzzle 250 pieces. At end of session entire exterior of puzzle established. Pt verbalizing "I've reached my puzzle limit. I don't think I can do anymore" pt visually fatigue and mentally fatigued attempting to task. Puzzle with odd shaped pieces and needed mod cueing to help connect. Pt ending session in recliner with sitter present.   OT Discharge Precautions/Restrictions  Precautions Precautions: Fall General   Vital Signs Therapy Vitals Temp: 98.3 F (36.8 C) Temp src: Oral Pulse Rate: 74 Resp: 17 BP: 121/74 mmHg Oxygen Therapy SpO2: 93 % O2 Device: None (Room air) Pain   ADL ADL Equipment Provided: Long-handled sponge ADL Comments: see FIM Vision/Perception  Vision- Assessment Eye Alignment: Within Functional Limits Additional Comments: reports diplopia in lower Rt and Lt quadrants. pt describes diplopia as one on top of another. Pt currently using Rt eye occlusion and tape on glasses to help compensate during adls. Pt provided  handout for diplopia exercises and  reviewed with wife. Perception Perception: Within Functional Limits Praxis Praxis: Impaired Praxis Impairment Details: Perseveration Praxis-Other Comments: pt currently very concerned with vision follow up for new glasses  Cognition Overall Cognitive Status: Impaired/Different from baseline Arousal/Alertness: Awake/alert Orientation Level: Oriented X4 Attention: Alternating Sustained Attention: Appears intact Selective Attention: Appears intact Alternating Attention: Impaired Alternating Attention Impairment: Functional basic;Verbal basic Memory: Impaired Memory Impairment: Storage deficit;Decreased recall of new information;Decreased short term memory Decreased Short Term Memory: Verbal basic;Functional basic Awareness: Impaired Awareness Impairment: Anticipatory impairment Executive Function: Decision Making Reasoning: Impaired Reasoning Impairment: Functional complex Safety/Judgment: Impaired [place] [employer] Scales of Cognitive Functioning: Purposeful/appropriate Sensation Sensation Light Touch: Appears Intact Hot/Cold: Appears Intact Coordination Gross Motor Movements are Fluid and Coordinated: Yes Fine Motor Movements are Fluid and Coordinated: Yes Motor    Mobility  Transfers Sit to Stand: 5: Supervision;From chair/3-in-1 Stand to Sit: 5: Supervision;To chair/3-in-1  Trunk/Postural Assessment  Cervical Assessment Cervical Assessment: Within Functional Limits Thoracic Assessment Thoracic Assessment: Within Functional Limits Lumbar Assessment Lumbar Assessment: Within Functional Limits Postural Control Postural Control: Within Functional Limits Righting Reactions: requires incr time  Balance   Extremity/Trunk Assessment RUE Assessment RUE Assessment: Within Functional Limits LUE Assessment LUE Assessment: Within Functional Limits  See FIM for current functional status  Peri Maris 01/21/2014, 7:41 AM Pager: [phone]

## 2014-01-21 NOTE — Progress Notes (Signed)
Social Work Patient ID: Maurice Paul, male   DOB: 12/17/1945, 68 y.o.   MRN: [id number]  Have reviewed team conference info with pt and wife.  Both feeling ready for d/c tomorrow.  Reveiwed OP f/u plans as well as recommendation that they follow up with [name].  Both very pleased with tremendous gains made on CIR.  Continue to follow to d/c.  [name], LCSW

## 2014-01-21 NOTE — Progress Notes (Signed)
Physical Therapy Discharge Summary  Patient Details  Name: Maurice Paul MRN: [id number] Date of Birth: Apr 11, 1946  Today's Date: 01/21/2014 Time: 0930-1030 Time Calculation (min): 60 min  Patient has met 12 of 12 long term goals due to improved activity tolerance, improved balance, improved postural control, increased strength, ability to compensate for deficits, improved attention, improved awareness and improved coordination.  Patient to discharge at an ambulatory level Supervision.   Patient's wife  is independent to provide the necessary cognitive assistance at discharge. Patient's wife has participated in family education/training session in order to provide appropriate supervision level assistance and cues for increased safety. Patient and wife verbalize understanding and are in agreement with recommendations of 24/7 supervision upon discharge and have been educated on various strategies to maintain 24/7 supervision.  Reasons goals not met: N/A, all LTGs met.  Recommendation:  Patient will benefit from ongoing skilled PT services in outpatient setting to continue to advance safe functional mobility, address ongoing impairments in vestibular issues, gait, balance, strength, activity tolerance, and minimize fall risk.  Equipment: No equipment provided  Reasons for discharge: treatment goals met and discharge from hospital  Patient/family agrees with progress made and goals achieved: Yes  Skilled Therapeutic Interventions: Session today focused on all functional mobility and goal-related activities in preparation for discharge home with wife, Maurice Paul, tomorrow 01/22/14. [name] present for session to continue family education/training session. See details below for all functional mobility. Additionally, patient performed simulated car transfer to low/sedan height. Discussion/education about falls risk at home and indications/contraindications for attempting floor transfer vs. Calling EMS s/p  fall.   Patient and wife verbalized understanding of all recommendations and in agreement with recommendations. Patient and wife asked appropriate questions throughout session that demonstrated good understanding of recommendations and good problem-solving skills for home tasks and community activities.  PT Discharge Precautions/Restrictions Precautions Precautions: Fall Restrictions Weight Bearing Restrictions: No Pain Pain Assessment Pain Assessment: No/denies pain Pain Score: 0-No pain Vision/Perception  Vision - History Baseline Vision: Wears glasses only for reading Patient Visual Report: Diplopia Vision - Assessment Eye Alignment: Within Functional Limits Vision Assessment: Vision impaired - to be further tested in functional context Perception Perception: Within Functional Limits Praxis Praxis: Within Functional Limits Cognition Overall Cognitive Status: Impaired/Different from baseline Arousal/Alertness: Awake/alert Orientation Level: Oriented X4 Attention: Alternating Sustained Attention: Appears intact Selective Attention: Appears intact Alternating Attention: Impaired Alternating Attention Impairment: Functional basic;Verbal basic Memory: Impaired Memory Impairment: Storage deficit;Decreased recall of new information;Decreased short term memory Decreased Short Term Memory: Verbal basic;Functional basic Awareness: Impaired Awareness Impairment: Anticipatory impairment Executive Function: Decision Making Reasoning: Impaired Reasoning Impairment: Functional complex Safety/Judgment: Impaired [employer] Scales of Cognitive Functioning: Purposeful/appropriate Sensation Sensation Light Touch: Appears Intact Proprioception: Appears Intact Coordination Gross Motor Movements are Fluid and Coordinated: Yes (decreased speed and accuracy with rapid, alternating movements) Fine Motor Movements are Fluid and Coordinated: Yes (decreased speed and accuracy with  rapid, alternating movements) Motor  Motor Motor: Within Functional Limits  Mobility Bed Mobility Bed Mobility: Supine to Sit;Sit to Supine;Sitting - Scoot to Edge of Bed Supine to Sit: 5: Supervision;HOB flat Supine to Sit Details: Verbal cues for precautions/safety Sitting - Scoot to Edge of Bed: 5: Supervision Sitting - Scoot to [employer] of Bed Details: Verbal cues for precautions/safety Sit to Supine: 5: Supervision;HOB flat Sit to Supine - Details: Verbal cues for precautions/safety Transfers Transfers: Yes Sit to Stand: 5: Supervision;From chair/3-in-1;With armrests;With upper extremity assist;Without upper extremity assist;From bed Sit to Stand Details: Verbal cues for  precautions/safety Stand to Sit: 5: Supervision;To chair/3-in-1;To bed;With armrests;Without upper extremity assist;With upper extremity assist Stand to Sit Details (indicate cue type and reason): Verbal cues for precautions/safety Stand Pivot Transfers: 5: Supervision;With armrests Stand Pivot Transfer Details: Verbal cues for precautions/safety Locomotion  Ambulation Ambulation: Yes Ambulation/Gait Assistance: 5: Supervision Ambulation Distance (Feet): 300 Feet Assistive device: None Ambulation/Gait Assistance Details: Verbal cues for precautions/safety Ambulation/Gait Assistance Details: Functional ambulation in various environments (controlled, home, and community) and on various surfaces (tile, carpet) >300' several bouts with supervision. Gait Gait: Yes Gait Pattern: Impaired Gait Pattern: Step-through pattern;Decreased stride length;Lateral trunk lean to right;Lateral trunk lean to left;Wide base of support Stairs / Additional Locomotion Stairs: Yes Stairs Assistance: 5: Supervision Stairs Assistance Details: Verbal cues for sequencing;Verbal cues for technique;Verbal cues for precautions/safety Stairs Assistance Details (indicate cue type and reason): Patient requires verbal cues for step to pattern to  facilitate appropriate pacing. Stair Management Technique: One rail Left;One rail Right;Step to pattern;Forwards (L rail ascending, R rail descending to simulate stairs to second level in home) Number of Stairs: 12 Height of Stairs: 6 Wheelchair Mobility Wheelchair Mobility: No (ambulation is patient's primary means of locomotion)  Trunk/Postural Assessment  Cervical Assessment Cervical Assessment: Within Functional Limits Thoracic Assessment Thoracic Assessment: Within Functional Limits Lumbar Assessment Lumbar Assessment: Within Functional Limits Postural Control Postural Control: Within Functional Limits Righting Reactions: delayed Protective Responses: delayed  Balance Balance Balance Assessed: Yes Standardized Balance Assessment Standardized Balance Assessment: Berg Balance Test (score: 46/56 on 01/20/14; indicating patient is at a moderate risk for falls (>50%).) Static Sitting Balance Static Sitting - Balance Support: Bilateral upper extremity supported;No upper extremity supported;Feet supported;Feet unsupported Static Sitting - Level of Assistance: 5: Stand by assistance Dynamic Sitting Balance Dynamic Sitting - Balance Support: No upper extremity supported;Feet supported;Feet unsupported;During functional activity Dynamic Sitting - Level of Assistance: 5: Stand by assistance Static Standing Balance Static Standing - Balance Support: No upper extremity supported;During functional activity Static Standing - Level of Assistance: 5: Stand by assistance Dynamic Standing Balance Dynamic Standing - Balance Support: No upper extremity supported;During functional activity Dynamic Standing - Level of Assistance: 5: Stand by assistance Dynamic Standing - Balance Activities: Reaching for weighted objects;Reaching for objects;Forward lean/weight shifting;Lateral lean/weight shifting (picking items up off floor) Extremity Assessment  RLE Assessment RLE Assessment: Within Functional  Limits LLE Assessment LLE Assessment: Within Functional Limits  See FIM for current functional status  Necole Minassian S Flint Melter. Brylei Pedley, PT, DPT 01/21/2014, 11:57 AM

## 2014-01-22 MED ORDER — QUETIAPINE FUMARATE 100 MG PO TABS: 100.0000 mg | ORAL_TABLET | Freq: Every day | ORAL | Status: DC

## 2014-01-22 MED ORDER — ADULT MULTIVITAMIN W/MINERALS CH: 1.0000 | ORAL_TABLET | Freq: Every day | ORAL | Status: DC

## 2014-01-22 MED ORDER — CLONIDINE HCL 0.1 MG/24HR TD PTWK: 0.1000 mg | MEDICATED_PATCH | TRANSDERMAL | Status: DC

## 2014-01-22 NOTE — Progress Notes (Signed)
Social Work  Discharge Note  The overall goal for the admission was met for:   Discharge location: Yes - home with wife to provide 24/7 supervision  Length of Stay: Yes - 14 days  Discharge activity level: Yes - supervision  Home/community participation: Yes  Services provided included: MD, RD, PT, OT, SLP, RN, TR, Pharmacy, Neuropsych and SW  Financial Services: Medicare and Private Insurance: [facility]  Follow-up services arranged: Outpatient: PT, OT, ST via [facility] and Patient/Family has no preference for HH/DME agencies  Comments (or additional information):  Patient/Family verbalized understanding of follow-up arrangements: Yes  Individual responsible for coordination of the follow-up plan: patient  Confirmed correct DME delivered: NA    [facility]

## 2014-01-22 NOTE — Progress Notes (Signed)
Patient with wife at bedside was given discharge information from Malissa Hippoan A., PA all questions answered. Patient assisted to car with nurse tech assistance.

## 2014-01-22 NOTE — Progress Notes (Signed)
Subjective/Complaints: 68 y.o. right-handed male with history of tobacco abuse. Patient on no scheduled medications prior to admission. Admitted 12/24/2013 after a fall down approximately 8-10 steps and found to by his wife. No report of loss of consciousness. CT of the head showed subarachnoid hemorrhage over the high left parietal convexities and a small amount of subdural hemorrhage along the falx and tentorium without hydrocephalus or midline shift. CT cervical spine negative. Neurosurgery consulted Dr. Barnett AbuHenry Elsner with conservative care. Followup MRI of the brain 01/02/2014 showed persistent small volume of intracranial hemorrhage both subarachnoid and subdural no other acute abnormalities. Patient did require short intubation and was extubated 12/26/2013. EEG consistent with diffuse multifocal encephalopathy. Venous Doppler studies 01/07/2014 shows DVT left mid peroneal vein and Lovenox was initiated   Good night. Slept well.     Objective: Vital Signs: Blood pressure 121/60, pulse 67, temperature 98 F (36.7 C), temperature source Oral, resp. rate 18, height 5\' 10"  (1.778 m), weight 130.137 kg (286 lb 14.4 oz), SpO2 92.00%. No results found. No results found for this or any previous visit (from the past 72 hour(s)).   HEENT: normal Cardio: RRR and no murmur Resp: CTA B/L and unlabored GI: BS positive and NT,ND Extremity:  No Edema Skin:   Intact Neuro:   oriented to self and [place] Cranial Nerve II-XII normal, Abnormal Motor 4/5 in BUE and BLE. Thought processing improved but still tangential. Better insight and awareness. RLAS 6  Musc/Skel:  Normal Gen NAD   Assessment/Plan: 1. 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 Physiatrist is providing close team supervision and 24 hour management of active medical problems listed below. Physiatrist  and rehab team continue to assess barriers to discharge/monitor patient progress toward functional and medical goals.   Home today!!! Follow up arranged  FIM: FIM - Bathing Bathing Steps Patient Completed: Chest;Right Arm;Left Arm;Abdomen;Front perineal area;Buttocks;Right upper leg;Left upper leg;Right lower leg (including foot);Left lower leg (including foot) Bathing: 5: Supervision: Safety issues/verbal cues  FIM - Upper Body Dressing/Undressing Upper body dressing/undressing steps patient completed: Thread/unthread right sleeve of pullover shirt/dresss;Thread/unthread left sleeve of pullover shirt/dress;Put head through opening of pull over shirt/dress;Pull shirt over trunk Upper body dressing/undressing: 6: Assistive device (Comment) FIM - Lower Body Dressing/Undressing Lower body dressing/undressing steps patient completed: Thread/unthread right underwear leg;Thread/unthread left underwear leg;Pull underwear up/down;Thread/unthread right pants leg;Pull pants up/down;Thread/unthread left pants leg Lower body dressing/undressing: 5: Supervision: Safety issues/verbal cues (don shoes but they are slip on)  FIM - Toileting Toileting steps completed by patient: Adjust clothing prior to toileting;Performs perineal hygiene;Adjust clothing after toileting Toileting Assistive Devices: Grab bar or rail for support Toileting: 5: Supervision: Safety issues/verbal cues  FIM - Diplomatic Services operational officerToilet Transfers Toilet Transfers Assistive Devices: Grab bars Toilet Transfers: 5-To toilet/BSC: Supervision (verbal cues/safety issues);5-From toilet/BSC: Supervision (verbal cues/safety issues)  FIM - Press photographerBed/Chair Transfer Bed/Chair Transfer Assistive Devices: Arm rests Bed/Chair Transfer: 5: Supine > Sit: Supervision (verbal cues/safety issues);5: Sit > Supine: Supervision (verbal cues/safety issues);5: Chair or W/C > Bed: Supervision (verbal cues/safety issues);5: Bed > Chair or W/C: Supervision (verbal cues/safety  issues)  FIM - Locomotion: Wheelchair Distance: 15 Locomotion: Wheelchair: 0: Activity did not occur (ambulation is primary means of locomotion) FIM - Locomotion: Ambulation Locomotion: Ambulation Assistive Devices: Other (comment) (none) Ambulation/Gait Assistance: 5: Supervision Locomotion: Ambulation: 5: Travels 150 ft or more with supervision/safety issues  Comprehension Comprehension Mode: Auditory Comprehension: 5-Follows basic  conversation/direction: With extra time/assistive device  Expression Expression Mode: Verbal Expression: 5-Expresses basic needs/ideas: With extra time/assistive device  Social Interaction Social Interaction: 6-Interacts appropriately with others with medication or extra time (anti-anxiety, antidepressant).  Problem Solving Problem Solving: 5-Solves basic 90% of the time/requires cueing < 10% of the time  Memory Memory: 4-Recognizes or recalls 75 - 89% of the time/requires cueing 10 - 24% of the time  Medical Problem List and Plan:  1. left-sided SDH/SAH after a fall  2. DVT Prophylaxis/Anticoagulation:. Venous Doppler of lower extremities 01/07/2014 shows left DVT mid peroneal vein. Lovenox stopped due to hematuria--he is ambulatory  -clots resolved per doppler Thursday---no follow up at this time as he's ambulatory    3. Pain Management: Tylenol as needed  4. Mood: Seroquel 100 mg each bedtime---would continue upon discharge---decide adjustment when he sees me at office    5. Neuropsych: This patient is not capable of making decisions on his own behalf.  6. Dysphagia. Dysphagia 3 thins==tolerating well 7. Hypertension. Clonidine patch 0-1 mg weekly.  8. Tobacco abuse. Counseling  9. Klebsiella UTI. Keflex completed   LOS (Days) 14 A FACE TO FACE EVALUATION WAS PERFORMED  Ranelle OysterZachary T Terrell Ostrand 01/22/2014, 8:36 AM

## 2014-01-23 ENCOUNTER — Ambulatory Visit: Payer: Medicare Other | Attending: Physical Medicine & Rehabilitation | Admitting: Physical Therapy

## 2014-01-23 DIAGNOSIS — R4701 Aphasia: Secondary | ICD-10-CM | POA: Insufficient documentation

## 2014-01-23 DIAGNOSIS — R262 Difficulty in walking, not elsewhere classified: Secondary | ICD-10-CM | POA: Insufficient documentation

## 2014-01-23 DIAGNOSIS — M6281 Muscle weakness (generalized): Secondary | ICD-10-CM | POA: Insufficient documentation

## 2014-01-23 DIAGNOSIS — Z5189 Encounter for other specified aftercare: Secondary | ICD-10-CM | POA: Diagnosis present

## 2014-01-23 DIAGNOSIS — R41841 Cognitive communication deficit: Secondary | ICD-10-CM | POA: Diagnosis not present

## 2014-01-23 DIAGNOSIS — R279 Unspecified lack of coordination: Secondary | ICD-10-CM | POA: Insufficient documentation

## 2014-01-27 ENCOUNTER — Ambulatory Visit: Payer: Medicare Other | Admitting: Occupational Therapy

## 2014-01-28 ENCOUNTER — Ambulatory Visit: Payer: Medicare Other

## 2014-01-28 ENCOUNTER — Ambulatory Visit: Payer: Medicare Other | Admitting: Occupational Therapy

## 2014-01-28 DIAGNOSIS — Z5189 Encounter for other specified aftercare: Secondary | ICD-10-CM | POA: Diagnosis not present

## 2014-02-02 ENCOUNTER — Ambulatory Visit: Payer: Medicare Other | Admitting: Rehabilitative and Restorative Service Providers"

## 2014-02-02 ENCOUNTER — Ambulatory Visit: Payer: Medicare Other | Admitting: Occupational Therapy

## 2014-02-02 ENCOUNTER — Ambulatory Visit: Payer: Medicare Other

## 2014-02-02 DIAGNOSIS — Z5189 Encounter for other specified aftercare: Secondary | ICD-10-CM | POA: Diagnosis not present

## 2014-02-04 ENCOUNTER — Ambulatory Visit: Payer: Medicare Other | Admitting: Occupational Therapy

## 2014-02-04 ENCOUNTER — Ambulatory Visit: Payer: Medicare Other | Admitting: Rehabilitative and Restorative Service Providers"

## 2014-02-04 ENCOUNTER — Ambulatory Visit: Payer: Medicare Other

## 2014-02-04 DIAGNOSIS — Z5189 Encounter for other specified aftercare: Secondary | ICD-10-CM | POA: Diagnosis not present

## 2014-02-10 ENCOUNTER — Ambulatory Visit: Payer: Medicare Other | Admitting: Speech Pathology

## 2014-02-10 ENCOUNTER — Ambulatory Visit
Payer: Medicare Other | Attending: Physical Medicine & Rehabilitation | Admitting: Rehabilitative and Restorative Service Providers"

## 2014-02-10 ENCOUNTER — Ambulatory Visit: Payer: Medicare Other | Admitting: Occupational Therapy

## 2014-02-10 DIAGNOSIS — R4701 Aphasia: Secondary | ICD-10-CM | POA: Insufficient documentation

## 2014-02-10 DIAGNOSIS — R41841 Cognitive communication deficit: Secondary | ICD-10-CM | POA: Diagnosis not present

## 2014-02-10 DIAGNOSIS — R262 Difficulty in walking, not elsewhere classified: Secondary | ICD-10-CM | POA: Insufficient documentation

## 2014-02-10 DIAGNOSIS — Z5189 Encounter for other specified aftercare: Secondary | ICD-10-CM | POA: Diagnosis present

## 2014-02-10 DIAGNOSIS — R279 Unspecified lack of coordination: Secondary | ICD-10-CM | POA: Insufficient documentation

## 2014-02-10 DIAGNOSIS — M6281 Muscle weakness (generalized): Secondary | ICD-10-CM | POA: Diagnosis not present

## 2014-02-12 ENCOUNTER — Ambulatory Visit: Payer: Medicare Other

## 2014-02-12 ENCOUNTER — Ambulatory Visit: Payer: Medicare Other | Admitting: Occupational Therapy

## 2014-02-12 DIAGNOSIS — Z5189 Encounter for other specified aftercare: Secondary | ICD-10-CM | POA: Diagnosis not present

## 2014-02-16 ENCOUNTER — Ambulatory Visit: Payer: Medicare Other

## 2014-02-16 ENCOUNTER — Ambulatory Visit: Payer: Medicare Other | Admitting: Physical Therapy

## 2014-02-16 ENCOUNTER — Ambulatory Visit: Payer: Medicare Other | Admitting: Occupational Therapy

## 2014-02-16 DIAGNOSIS — Z5189 Encounter for other specified aftercare: Secondary | ICD-10-CM | POA: Diagnosis not present

## 2014-02-18 ENCOUNTER — Ambulatory Visit: Payer: Medicare Other

## 2014-02-18 ENCOUNTER — Ambulatory Visit: Payer: Medicare Other | Admitting: Occupational Therapy

## 2014-02-18 ENCOUNTER — Ambulatory Visit: Payer: Medicare Other | Admitting: Rehabilitative and Restorative Service Providers"

## 2014-02-18 DIAGNOSIS — Z5189 Encounter for other specified aftercare: Secondary | ICD-10-CM | POA: Diagnosis not present

## 2014-02-24 ENCOUNTER — Ambulatory Visit: Payer: Medicare Other | Admitting: Occupational Therapy

## 2014-02-24 ENCOUNTER — Ambulatory Visit: Payer: Medicare Other

## 2014-02-24 ENCOUNTER — Ambulatory Visit: Payer: Medicare Other | Admitting: Rehabilitative and Restorative Service Providers"

## 2014-02-24 DIAGNOSIS — Z5189 Encounter for other specified aftercare: Secondary | ICD-10-CM | POA: Diagnosis not present

## 2014-02-26 ENCOUNTER — Telehealth: Payer: Self-pay

## 2014-02-26 ENCOUNTER — Ambulatory Visit: Payer: Medicare Other

## 2014-02-26 ENCOUNTER — Ambulatory Visit: Payer: Medicare Other | Admitting: Occupational Therapy

## 2014-02-26 ENCOUNTER — Ambulatory Visit: Payer: Medicare Other | Admitting: Rehabilitative and Restorative Service Providers"

## 2014-02-26 DIAGNOSIS — Z5189 Encounter for other specified aftercare: Secondary | ICD-10-CM | POA: Diagnosis not present

## 2014-02-26 NOTE — Telephone Encounter (Signed)
Please reduce to 50mg  qhs

## 2014-02-26 NOTE — Telephone Encounter (Signed)
Not sure if i sent message back to you---see below

## 2014-02-26 NOTE — Telephone Encounter (Addendum)
Patient has concerns about the Seroquel and the side effects. Patient states since he has been taking the Seroquel he has had dizziness, blurred vision, and a change in stool. Patient also believes the medication is reducing the benefit of therapy for him. He is requesting a dose change or D/C Seroquel. Please advise.

## 2014-02-27 NOTE — Telephone Encounter (Signed)
Contacted patient's wife to inform her that patient can reduce Seroquel to 50 mg qhs per Dr. Riley Kill. Wife stated that is what the patient done last night, so she will see if there is a difference when he awakes this morning.

## 2014-03-03 ENCOUNTER — Ambulatory Visit: Payer: Medicare Other

## 2014-03-03 ENCOUNTER — Ambulatory Visit: Payer: Medicare Other | Admitting: Occupational Therapy

## 2014-03-03 ENCOUNTER — Ambulatory Visit: Payer: Medicare Other | Admitting: Physical Therapy

## 2014-03-03 DIAGNOSIS — Z5189 Encounter for other specified aftercare: Secondary | ICD-10-CM | POA: Diagnosis not present

## 2014-03-04 ENCOUNTER — Ambulatory Visit: Payer: Medicare Other | Attending: Physical Medicine & Rehabilitation

## 2014-03-04 ENCOUNTER — Ambulatory Visit: Payer: Medicare Other | Admitting: Occupational Therapy

## 2014-03-04 ENCOUNTER — Ambulatory Visit: Payer: Medicare Other | Admitting: Rehabilitative and Restorative Service Providers"

## 2014-03-04 DIAGNOSIS — Z5189 Encounter for other specified aftercare: Secondary | ICD-10-CM | POA: Insufficient documentation

## 2014-03-04 DIAGNOSIS — R4701 Aphasia: Secondary | ICD-10-CM | POA: Insufficient documentation

## 2014-03-04 DIAGNOSIS — R41841 Cognitive communication deficit: Secondary | ICD-10-CM | POA: Insufficient documentation

## 2014-03-04 DIAGNOSIS — R279 Unspecified lack of coordination: Secondary | ICD-10-CM | POA: Insufficient documentation

## 2014-03-04 DIAGNOSIS — R262 Difficulty in walking, not elsewhere classified: Secondary | ICD-10-CM | POA: Insufficient documentation

## 2014-03-04 DIAGNOSIS — M6281 Muscle weakness (generalized): Secondary | ICD-10-CM | POA: Insufficient documentation

## 2014-03-09 DIAGNOSIS — S065X9A Traumatic subdural hemorrhage with loss of consciousness of unspecified duration, initial encounter: Secondary | ICD-10-CM

## 2014-03-09 DIAGNOSIS — S065XAA Traumatic subdural hemorrhage with loss of consciousness status unknown, initial encounter: Secondary | ICD-10-CM

## 2014-03-09 HISTORY — DX: Traumatic subdural hemorrhage with loss of consciousness status unknown, initial encounter: S06.5XAA

## 2014-03-09 HISTORY — DX: Traumatic subdural hemorrhage with loss of consciousness of unspecified duration, initial encounter: S06.5X9A

## 2014-03-10 ENCOUNTER — Ambulatory Visit: Payer: Medicare Other | Admitting: Speech Pathology

## 2014-03-10 ENCOUNTER — Ambulatory Visit: Payer: Medicare Other | Admitting: Rehabilitative and Restorative Service Providers"

## 2014-03-10 ENCOUNTER — Ambulatory Visit: Payer: Medicare Other | Attending: Physical Medicine & Rehabilitation | Admitting: Occupational Therapy

## 2014-03-10 DIAGNOSIS — Z5189 Encounter for other specified aftercare: Secondary | ICD-10-CM | POA: Diagnosis not present

## 2014-03-10 DIAGNOSIS — R262 Difficulty in walking, not elsewhere classified: Secondary | ICD-10-CM | POA: Diagnosis not present

## 2014-03-10 DIAGNOSIS — R41841 Cognitive communication deficit: Secondary | ICD-10-CM | POA: Insufficient documentation

## 2014-03-10 DIAGNOSIS — R4701 Aphasia: Secondary | ICD-10-CM | POA: Insufficient documentation

## 2014-03-10 DIAGNOSIS — R279 Unspecified lack of coordination: Secondary | ICD-10-CM | POA: Diagnosis not present

## 2014-03-10 DIAGNOSIS — M6281 Muscle weakness (generalized): Secondary | ICD-10-CM | POA: Diagnosis not present

## 2014-03-13 ENCOUNTER — Encounter: Payer: Medicare Other | Admitting: Occupational Therapy

## 2014-03-13 ENCOUNTER — Ambulatory Visit: Payer: Medicare Other

## 2014-03-13 ENCOUNTER — Ambulatory Visit: Payer: Medicare Other | Admitting: Rehabilitative and Restorative Service Providers"

## 2014-03-13 DIAGNOSIS — Z5189 Encounter for other specified aftercare: Secondary | ICD-10-CM | POA: Diagnosis not present

## 2014-03-17 ENCOUNTER — Ambulatory Visit: Payer: Medicare Other | Admitting: Speech Pathology

## 2014-03-17 ENCOUNTER — Encounter: Payer: Medicare Other | Admitting: Occupational Therapy

## 2014-03-17 DIAGNOSIS — Z5189 Encounter for other specified aftercare: Secondary | ICD-10-CM | POA: Diagnosis not present

## 2014-03-18 ENCOUNTER — Encounter: Payer: Self-pay | Admitting: Physical Medicine & Rehabilitation

## 2014-03-18 ENCOUNTER — Encounter: Payer: Medicare Other | Attending: Physical Medicine & Rehabilitation | Admitting: Physical Medicine & Rehabilitation

## 2014-03-18 VITALS — BP 168/75 | HR 68 | Resp 14 | Ht 70.0 in | Wt 288.0 lb

## 2014-03-18 DIAGNOSIS — I609 Nontraumatic subarachnoid hemorrhage, unspecified: Secondary | ICD-10-CM

## 2014-03-18 DIAGNOSIS — I82409 Acute embolism and thrombosis of unspecified deep veins of unspecified lower extremity: Secondary | ICD-10-CM

## 2014-03-18 DIAGNOSIS — F17200 Nicotine dependence, unspecified, uncomplicated: Secondary | ICD-10-CM | POA: Insufficient documentation

## 2014-03-18 DIAGNOSIS — I69998 Other sequelae following unspecified cerebrovascular disease: Principal | ICD-10-CM

## 2014-03-18 DIAGNOSIS — I10 Essential (primary) hypertension: Secondary | ICD-10-CM | POA: Insufficient documentation

## 2014-03-18 DIAGNOSIS — S065X9A Traumatic subdural hemorrhage with loss of consciousness of unspecified duration, initial encounter: Secondary | ICD-10-CM

## 2014-03-18 DIAGNOSIS — I739 Peripheral vascular disease, unspecified: Secondary | ICD-10-CM | POA: Insufficient documentation

## 2014-03-18 DIAGNOSIS — I6200 Nontraumatic subdural hemorrhage, unspecified: Secondary | ICD-10-CM

## 2014-03-18 DIAGNOSIS — H539 Unspecified visual disturbance: Secondary | ICD-10-CM | POA: Insufficient documentation

## 2014-03-18 DIAGNOSIS — S065XAA Traumatic subdural hemorrhage with loss of consciousness status unknown, initial encounter: Secondary | ICD-10-CM

## 2014-03-18 DIAGNOSIS — G9341 Metabolic encephalopathy: Secondary | ICD-10-CM

## 2014-03-18 MED ORDER — QUETIAPINE FUMARATE 25 MG PO TABS: 25.0000 mg | ORAL_TABLET | Freq: Every day | ORAL | Status: DC

## 2014-03-18 NOTE — Progress Notes (Signed)
Subjective:    Patient ID: Maurice Paul, male    DOB: 26-May-1946, 68 y.o.   MRN: [id number]  HPI  Maurice Paul is back regarding his SDH/SAH after fall. He has been still involved in outpt SLP therapies. PT completes tomorrow.  He has been cleared from a NS standpoint.   He was discharged from rehab in April.   He is still struggling with his vision. The diplopia has resolved. He still has difficulties with his peripheral vision and sometimes with sensory overload especially when he's in open spaces, big stores.   His balance has been good. He's not using an adaptive device.  From a sleep standpoint, he's doing fairly well. He does sometimes struggle the day before he has an appt (due to some anxiety). He may sleep 9-12 hours per night. He is using 99m seroquel at night still. He tried a few days without it and it impacted his sleep, therefore decreasing from the 1042mto the 5056mose.   Behaviorally, there haven't been any big issues. He is getting along with his wife and has met with his friends on occasion.     Pain Inventory Average Pain 0 Pain Right Now 0 My pain is no pain  In the last 24 hours, has pain interfered with the following? General activity 0 Relation with others 0 Enjoyment of life 0 What TIME of day is your pain at its worst? no pain Sleep (in general) Good  Pain is worse with: na Pain improves with: na Relief from Meds: no pain meds  Mobility walk without assistance ability to climb steps?  yes do you drive?  yes transfers alone  Function retired I need assistance with the following:  household duties and shopping  Neuro/Psych confusion  Prior Studies Any changes since last visit?  no  Physicians involved in your care Any changes since last visit?  no   History reviewed. No pertinent family history. History   Social History  . Marital Status: Married    Spouse Name: N/A    Number of Children: N/A  . Years of Education: N/A    Social History Main Topics  . Smoking status: Former Smoker    Quit date: 10/09/1998  . Smokeless tobacco: Current User    Types: Chew  . Alcohol Use: Yes  . Drug Use: None  . Sexual Activity: None   Other Topics Concern  . None   Social History Narrative  . None   Past Surgical History  Procedure Laterality Date  . Cyst removal trunk     Past Medical History  Diagnosis Date  . PVD (peripheral vascular disease)   . Tobacco chew use     for 30 years   BP 168/75  Pulse 68  Resp 14  Ht _0  (1.778 m)  Wt 288 lb (130.636 kg)  BMI 41.32 kg/m2  SpO2 92%  Opioid Risk Score:   Fall Risk Score: Moderate Fall Risk (6-13 points) (pt educated on fall risk, brochure given to pt)    Review of Systems  Psychiatric/Behavioral: Positive for confusion.  All other systems reviewed and are negative.      Objective:   Physical Exam HEENT: normal  Cardio: RRR and no murmur  Resp: CTA B/L and unlabored  GI: BS positive and NT,ND  Extremity: No Edema  Skin: Intact  Neuro: a x 0 x3 except for date (june 8 instead of June 10) Thought processing improved. Still some shor term memory and attention  deficits.  Better insight. Balance is nearly normal. Strength 5/5. No CN findings. Visual fields normal. He struggled reading from afar.  Did not remember any of 3 words after 5 minutes. Incorrect on 1/3 serial 7's. Sequenced letter and numbers correctly. Abstract thinking appropriate.  Musc/Skel: chronic stasis changes in the lower legs Gen NAD   Assessment/Plan:    1. left-sided SDH/SAH after a fall  -continue with therapy to completion  -he's made great progress.  2. Pain Management: Tylenol as needed  3. Mood.sleep: Seroquel 25 qhs---continue for three weeks then cut in half for 2 weeks and stop.  I doubt that he will need this long term. 4. Hypertension. Clonidine patch 0-1 mg weekly.  5. Visual deficits. He appears a little near sited. I don't believe any of his visual  perceptual deficits are still related to his visual function. i think these problems are more a result of his attention and focus, particularly in larger, more distracting environments.  He is already beginning to display improvemet, so I dont' believe he needs any formal medication to treat this right now. He should continue with therapies, get adequate sleep (8-10 hours), and allow this to continue improving. I did recommend a basic eye exam and new eye glass rx.  30 minutes of face to face patient care time were spent during this visit. All questions were encouraged and answered. I will see him back in 2 months.

## 2014-03-18 NOTE — Patient Instructions (Signed)
TAKE THE 25MG  SEROQUEL FOR 3 WEEKS THEN TAKE 1/2 TAB FOR 2 WEEKS THEN STOP.  TARGET 8-10 HOURS OF SLEEP PER NIGHT.   CONTINUE TO GET OUT IN PUBLIC, WITH YOUR FRIENDS, WIFE, ETC!!!  PLEASE CALL ME WITH ANY PROBLEMS OR QUESTIONS (#[phone]).

## 2014-03-19 ENCOUNTER — Ambulatory Visit: Payer: Medicare Other | Admitting: Speech Pathology

## 2014-03-19 ENCOUNTER — Ambulatory Visit: Payer: Medicare Other | Admitting: Occupational Therapy

## 2014-03-19 DIAGNOSIS — Z5189 Encounter for other specified aftercare: Secondary | ICD-10-CM | POA: Diagnosis not present

## 2014-03-23 ENCOUNTER — Encounter: Payer: Medicare Other | Admitting: Occupational Therapy

## 2014-03-25 ENCOUNTER — Encounter: Payer: Medicare Other | Admitting: Speech Pathology

## 2014-03-25 ENCOUNTER — Encounter: Payer: Medicare Other | Admitting: Occupational Therapy

## 2014-05-17 ENCOUNTER — Other Ambulatory Visit: Payer: Self-pay | Admitting: Physical Medicine & Rehabilitation

## 2014-05-27 ENCOUNTER — Encounter: Payer: Medicare Other | Attending: Physical Medicine & Rehabilitation | Admitting: Physical Medicine & Rehabilitation

## 2014-05-27 ENCOUNTER — Encounter: Payer: Self-pay | Admitting: Physical Medicine & Rehabilitation

## 2014-05-27 VITALS — BP 144/91 | HR 73 | Resp 14 | Ht 70.0 in | Wt 292.0 lb

## 2014-05-27 DIAGNOSIS — F17200 Nicotine dependence, unspecified, uncomplicated: Secondary | ICD-10-CM | POA: Diagnosis not present

## 2014-05-27 DIAGNOSIS — H539 Unspecified visual disturbance: Secondary | ICD-10-CM | POA: Insufficient documentation

## 2014-05-27 DIAGNOSIS — I739 Peripheral vascular disease, unspecified: Secondary | ICD-10-CM | POA: Insufficient documentation

## 2014-05-27 DIAGNOSIS — S069X5S Unspecified intracranial injury with loss of consciousness greater than 24 hours with return to pre-existing conscious level, sequela: Secondary | ICD-10-CM

## 2014-05-27 DIAGNOSIS — S069XAA Unspecified intracranial injury with loss of consciousness status unknown, initial encounter: Secondary | ICD-10-CM | POA: Insufficient documentation

## 2014-05-27 DIAGNOSIS — I69998 Other sequelae following unspecified cerebrovascular disease: Secondary | ICD-10-CM | POA: Diagnosis present

## 2014-05-27 DIAGNOSIS — I10 Essential (primary) hypertension: Secondary | ICD-10-CM | POA: Insufficient documentation

## 2014-05-27 DIAGNOSIS — S069X9S Unspecified intracranial injury with loss of consciousness of unspecified duration, sequela: Secondary | ICD-10-CM

## 2014-05-27 DIAGNOSIS — S069X9A Unspecified intracranial injury with loss of consciousness of unspecified duration, initial encounter: Secondary | ICD-10-CM | POA: Insufficient documentation

## 2014-05-27 DIAGNOSIS — S069XAS Unspecified intracranial injury with loss of consciousness status unknown, sequela: Secondary | ICD-10-CM

## 2014-05-27 HISTORY — DX: Unspecified intracranial injury with loss of consciousness status unknown, initial encounter: S06.9XAA

## 2014-05-27 HISTORY — DX: Unspecified intracranial injury with loss of consciousness of unspecified duration, initial encounter: S06.9X9A

## 2014-05-27 NOTE — Patient Instructions (Signed)
PLEASE CALL ME WITH ANY PROBLEMS OR QUESTIONS (#[phone]).      

## 2014-05-27 NOTE — Progress Notes (Signed)
Subjective:    Patient ID: Maurice Paul, male    DOB: 07/31/1946, 68 y.o.   MRN: [id number]  HPI  [name] is back regarding his SDH/SAH.   He is wearing a holter monitor currently to investigate his heart as a cause for syncope.  He had one episode while he was trying to pick up his dog and bearing down/not breathing in the process. He came to a bit as he was going down to the floor.   His vision has not improved. Hasn't gone to see optometrist yet however. He is able to work around his near sightedness to a large extent.        Pain Inventory Average Pain 0 Pain Right Now 0 My pain is na  In the last 24 hours, has pain interfered with the following? General activity 8 Relation with others 8 Enjoyment of life 8 What TIME of day is your pain at its worst? na Sleep (in general) Fair  Pain is worse with: na Pain improves with: na Relief from Meds: no pain meds  Mobility walk without assistance ability to climb steps?  yes do you drive?  yes transfers alone Do you have any goals in this area?  no  Function retired  Neuro/Psych loss of taste or smell  Prior Studies Any changes since last visit?  no  Physicians involved in your care Any changes since last visit?  yes cardiologist     History reviewed. No pertinent family history. History   Social History  . Marital Status: Married    Spouse Name: N/A    Number of Children: N/A  . Years of Education: N/A   Social History Main Topics  . Smoking status: Former Smoker    Quit date: 10/09/1998  . Smokeless tobacco: Current User    Types: Chew  . Alcohol Use: Yes  . Drug Use: None  . Sexual Activity: None   Other Topics Concern  . None   Social History Narrative  . None   Past Surgical History  Procedure Laterality Date  . Cyst removal trunk     Past Medical History  Diagnosis Date  . PVD (peripheral vascular disease)   . Tobacco chew use     for 30 years   BP 144/91  Pulse 73  Resp  14  Ht 5\' 10"  (1.778 m)  Wt 292 lb (132.45 kg)  BMI 41.90 kg/m2  SpO2 93%  Opioid Risk Score:   Fall Risk Score: High Fall Risk (>13 points) (pt educated declined brochure)    Review of Systems  HENT:       Loss of taste or smell  All other systems reviewed and are negative.      Objective:   Physical Exam  HEENT: normal  Cardio: RRR and no murmur  Resp: CTA B/L and unlabored  GI: BS positive and NT,ND  Extremity: No Edema  Skin: Intact  Neuro: a x 0 x3 except for date (june 8 instead of June 10) Thought processing improved. Still some shor term memory and attention deficits. Better insight. Balance is nearly normal. Strength 5/5. No CN findings. Visual fields normal. He struggled reading from afar. Did not remember any of 3 words after 5 minutes. Incorrect on 1/3 serial 7's. Sequenced letter and numbers correctly. Abstract thinking appropriate.  Musc/Skel: chronic stasis changes in the lower legs  Gen NAD   Assessment/Plan:  1. left-sided SDH/SAH after a fall    -he's made great progress.  2. Pain Management: Tylenol as needed  3. Sleep. Needs to work on better sleep habits 4. Discussed his syncope---await holter results. I suspect his symptoms were at least partly due to valsalva from bending over 5. Visual deficits: recommend corrective eye wear when possible. 30 minutes of face to face patient care time were spent during this visit. All questions were encouraged and answered.  I will see him back in prn.

## 2014-11-27 ENCOUNTER — Other Ambulatory Visit: Payer: Self-pay | Admitting: Family Medicine

## 2014-11-27 DIAGNOSIS — R413 Other amnesia: Secondary | ICD-10-CM

## 2014-11-30 ENCOUNTER — Other Ambulatory Visit: Payer: Self-pay | Admitting: Family Medicine

## 2014-11-30 DIAGNOSIS — R319 Hematuria, unspecified: Secondary | ICD-10-CM

## 2014-12-01 ENCOUNTER — Ambulatory Visit
Admission: RE | Admit: 2014-12-01 | Discharge: 2014-12-01 | Disposition: A | Discharge status: Home / Self Care | Payer: Medicare Other | Source: Ambulatory Visit | Attending: Family Medicine | Admitting: Family Medicine

## 2014-12-01 DIAGNOSIS — R319 Hematuria, unspecified: Secondary | ICD-10-CM

## 2014-12-03 ENCOUNTER — Ambulatory Visit
Admission: RE | Admit: 2014-12-03 | Discharge: 2014-12-03 | Disposition: A | Discharge status: Home / Self Care | Payer: Medicare Other | Source: Ambulatory Visit | Attending: Family Medicine | Admitting: Family Medicine

## 2014-12-03 DIAGNOSIS — R413 Other amnesia: Secondary | ICD-10-CM

## 2014-12-14 ENCOUNTER — Other Ambulatory Visit: Payer: Self-pay | Admitting: Family Medicine

## 2014-12-14 DIAGNOSIS — K7469 Other cirrhosis of liver: Secondary | ICD-10-CM

## 2014-12-15 ENCOUNTER — Other Ambulatory Visit: Payer: Medicare Other

## 2014-12-21 ENCOUNTER — Other Ambulatory Visit: Payer: Medicare Other

## 2014-12-22 ENCOUNTER — Ambulatory Visit
Admission: RE | Admit: 2014-12-22 | Discharge: 2014-12-22 | Disposition: A | Discharge status: Home / Self Care | Payer: Medicare Other | Source: Ambulatory Visit | Attending: Family Medicine | Admitting: Family Medicine

## 2014-12-22 DIAGNOSIS — K7469 Other cirrhosis of liver: Secondary | ICD-10-CM

## 2015-01-11 ENCOUNTER — Encounter: Payer: Self-pay | Admitting: Neurology

## 2015-01-11 ENCOUNTER — Ambulatory Visit (INDEPENDENT_AMBULATORY_CARE_PROVIDER_SITE_OTHER): Payer: Medicare Other | Admitting: Neurology

## 2015-01-11 VITALS — BP 133/71 | HR 71 | Temp 97.0°F | Ht 70.0 in | Wt 278.0 lb

## 2015-01-11 DIAGNOSIS — S065XAA Traumatic subdural hemorrhage with loss of consciousness status unknown, initial encounter: Secondary | ICD-10-CM

## 2015-01-11 DIAGNOSIS — S069X4A Unspecified intracranial injury with loss of consciousness of 6 hours to 24 hours, initial encounter: Secondary | ICD-10-CM | POA: Diagnosis not present

## 2015-01-11 DIAGNOSIS — S065X9A Traumatic subdural hemorrhage with loss of consciousness of unspecified duration, initial encounter: Secondary | ICD-10-CM

## 2015-01-11 DIAGNOSIS — I6200 Nontraumatic subdural hemorrhage, unspecified: Secondary | ICD-10-CM

## 2015-01-11 DIAGNOSIS — I609 Nontraumatic subarachnoid hemorrhage, unspecified: Secondary | ICD-10-CM | POA: Diagnosis not present

## 2015-01-11 NOTE — Progress Notes (Signed)
[facility]    Provider:  Dr Lucia GaskinsAhern Referring Provider: Catha GosselinLittle, Kevin, MD Primary Care Physician:  Redmond BasemanWONG,FRANCIS PATRICK, MD  CC:  Follow up for TBI with SDH and SAH  HPI:  Maurice Paul is a 69 y.o. male here as a referral from Dr. Clarene DukeLittle for TBI.   March 2015 he fell in the middle of the night on the stairs. He had a syncopal episode and traumatic SDH. He reports it is because he doesn't breath when he bends over. He was bending over on the stairs to pick something up that night and thinks he passed out after not breathing. For example, recently he went to get a parcel off the front porch and it was hard to pick up and while he was bent down he didn't breath. He has to concentrate to breath, he feels it isn't that he can't breath but that he just doesn't. No altered awareness otherwise, no staring spells. Wore a holter monitor and no cardiac reasons for syncope identified. No snoring. He naps a lot during the day but not because he has OSA. He just has irregular sleep habits. Denies falls other than when he is bending over and not breathing, this has resolved with his attention to breathing more. Short term memory problems have been a stable since the SDH. He has some word-finding deficits. 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 They want to discuss the last MRi. He has anosmia since the head injury. Denies headache, focal neurologic deficits.  Reviewed notes, labs and imaging from outside physicians, which showed:  MRI of the brain 12/03/2014: personally reviewed images with patient and his wife.   IMPRESSION: 1. No acute intracranial abnormality. 2. Small amount of chronic hemosiderin deposition related to remote hemorrhage as above. 3. Mild chronic small vessel ischemic disease, slightly progressed from prior.  Review of Systems: Patient complains of symptoms per HPI as well as the following symptoms: blurred  vision, change in appetite. Pertinent negatives per HPI. All others negative.   History   Social History  . Marital Status: Married    Spouse Name: Maurice BeltonDianne  . Number of Children: 3  . Years of Education: 12+   Occupational History  . Retired     Social History Main Topics  . Smoking status: Former Smoker    Quit date: 10/09/1998  . Smokeless tobacco: Current User    Types: Chew  . Alcohol Use: 0.0 oz/week    0 Standard drinks or equivalent per week     Comment: Occasional  . Drug Use: No  . Sexual Activity: Not on file   Other Topics Concern  . Not on file   Social History Narrative   Lives at home with wife.    Right handed.    Caffeine use:  Drinks 2L coke zero per day.        History reviewed. No pertinent family history.  Past Medical History  Diagnosis Date  . PVD (peripheral vascular disease)   . Tobacco chew use     for 30 years  . Hypertension     Past Surgical History  Procedure Laterality Date  . Cyst removal trunk      Current Outpatient Prescriptions  Medication Sig Dispense Refill  . lisinopril (PRINIVIL,ZESTRIL) 10 MG tablet Take 10 mg by mouth daily.    . cloNIDine (CATAPRES - DOSED IN MG/24 HR) 0.1 mg/24hr patch Place 1 patch (0.1 mg total) onto the skin every  Tuesday. (Patient not taking: Reported on 01/11/2015) 4 patch 12   No current facility-administered medications for this visit.    Allergies as of 01/11/2015 - Review Complete 01/11/2015  Allergen Reaction Noted  . Sulfa antibiotics  01/11/2015    Vitals: BP 133/71 mmHg  Pulse 71  Temp(Src) 97 F (36.1 C)  Ht  (1.778 m)  Wt 278 lb (126.1 kg)  BMI 39.89 kg/m2 Last Weight:  Wt Readings from Last 1 Encounters:  01/11/15 278 lb (126.1 kg)   Last Height:   Ht Readings from Last 1 Encounters:  01/11/15  (1.778 m)   Physical exam: Exam: Gen: NAD, conversant, well nourised, obese, well groomed                     CV: RRR, no MRG. No Carotid Bruits. No peripheral  edema, warm, nontender Eyes: Conjunctivae clear without exudates or hemorrhage  Neuro: Detailed Neurologic Exam  Speech:    Speech is normal; fluent and spontaneous with normal comprehension.  Cognition:    The patient is oriented to person, place, and time;     recent and remote memory intact;     language fluent;     normal attention, concentration,     fund of knowledge Cranial Nerves:    The pupils are equal, round, and reactive to light. The fundi are normal and spontaneous venous pulsations are present. Visual fields are full to finger confrontation. Extraocular movements are intact. Trigeminal sensation is intact and the muscles of mastication are normal. The face is symmetric. The palate elevates in the midline. Hearing intact. Voice is normal. Shoulder shrug is normal. The tongue has normal motion without fasciculations.   Coordination:    No dysmetria  Gait:    Wide-based due to body habitus  Motor Observation:    No asymmetry, no atrophy, and no involuntary movements noted. Tone:    Normal muscle tone.    Posture:    Posture is normal. normal erect    Strength:    Strength is V/V in the upper and lower limbs.      Sensation: intact to LT     Reflex Exam:  DTR's: absent achilles      Toes:    The toes are equivocal bilaterally.   Clonus:    Clonus is absent.       Assessment/Plan:  69 year old male with PMHx of TBI with SDH and SA. He denies any seizure activity, cardiogenic syncopal episodes - he says he falls because when he bends over he doesn't breath and he now pays more attention to it. He is here with wife, both agree memory issues are mild and neither are too worried about it since he is so functional and builds computers. MRI shows chronic hemosiderin, resolved bleed. Some non-specific WM changes advised to take a daily asa for stroke prevention and follow closely with pcp for vascular risk factor management. Offered to perform a MoCA/memory testing  and they declined. 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 No parkinsonism. No symptoms concerning for seizures or AMS as etiology of falls. Patient and wife feel comfortable with no follow up. They can return to clinic if any new or worsening problems.     Naomie Dean, MD  [facility] [address] [place], [place] [address]  Phone [phone] Fax [phone]

## 2015-01-11 NOTE — Patient Instructions (Signed)
Overall you are doing fairly well but I do want to suggest a few things today:   Remember to drink plenty of fluid, eat healthy meals and do not skip any meals. Try to eat protein with a every meal and eat a healthy snack such as fruit or nuts in between meals. Try to keep a regular sleep-wake schedule and try to exercise daily, particularly in the form of walking, 20-30 minutes a day, if you can.   Our phone number is [phone]. We also have an after hours call service for urgent matters and there is a physician on-call for urgent questions. For any emergencies you know to call 911 or go to the nearest emergency room   

## 2015-12-08 HISTORY — PX: UPPER GI ENDOSCOPY: SHX6162

## 2016-01-07 ENCOUNTER — Other Ambulatory Visit: Payer: Self-pay | Admitting: Gastroenterology

## 2016-01-07 DIAGNOSIS — K7469 Other cirrhosis of liver: Secondary | ICD-10-CM

## 2016-01-13 ENCOUNTER — Emergency Department (HOSPITAL_COMMUNITY): Payer: Medicare Other

## 2016-01-13 ENCOUNTER — Inpatient Hospital Stay (HOSPITAL_COMMUNITY)
Admission: EM | Admit: 2016-01-13 | Discharge: 2016-02-02 | DRG: 286 | Disposition: A | Discharge status: Hospice | Payer: Medicare Other | Attending: Internal Medicine | Admitting: Internal Medicine

## 2016-01-13 ENCOUNTER — Encounter (HOSPITAL_COMMUNITY): Payer: Self-pay | Admitting: Emergency Medicine

## 2016-01-13 DIAGNOSIS — E118 Type 2 diabetes mellitus with unspecified complications: Secondary | ICD-10-CM

## 2016-01-13 DIAGNOSIS — I878 Other specified disorders of veins: Secondary | ICD-10-CM | POA: Diagnosis present

## 2016-01-13 DIAGNOSIS — F329 Major depressive disorder, single episode, unspecified: Secondary | ICD-10-CM | POA: Diagnosis present

## 2016-01-13 DIAGNOSIS — I272 Other secondary pulmonary hypertension: Secondary | ICD-10-CM | POA: Diagnosis present

## 2016-01-13 DIAGNOSIS — E662 Morbid (severe) obesity with alveolar hypoventilation: Secondary | ICD-10-CM | POA: Diagnosis present

## 2016-01-13 DIAGNOSIS — D539 Nutritional anemia, unspecified: Secondary | ICD-10-CM | POA: Diagnosis present

## 2016-01-13 DIAGNOSIS — E119 Type 2 diabetes mellitus without complications: Secondary | ICD-10-CM

## 2016-01-13 DIAGNOSIS — R310 Gross hematuria: Secondary | ICD-10-CM | POA: Diagnosis not present

## 2016-01-13 DIAGNOSIS — E861 Hypovolemia: Secondary | ICD-10-CM | POA: Diagnosis not present

## 2016-01-13 DIAGNOSIS — A419 Sepsis, unspecified organism: Secondary | ICD-10-CM | POA: Diagnosis not present

## 2016-01-13 DIAGNOSIS — E1122 Type 2 diabetes mellitus with diabetic chronic kidney disease: Secondary | ICD-10-CM | POA: Diagnosis present

## 2016-01-13 DIAGNOSIS — J9620 Acute and chronic respiratory failure, unspecified whether with hypoxia or hypercapnia: Secondary | ICD-10-CM | POA: Diagnosis not present

## 2016-01-13 DIAGNOSIS — I248 Other forms of acute ischemic heart disease: Secondary | ICD-10-CM | POA: Diagnosis present

## 2016-01-13 DIAGNOSIS — J9600 Acute respiratory failure, unspecified whether with hypoxia or hypercapnia: Secondary | ICD-10-CM | POA: Diagnosis not present

## 2016-01-13 DIAGNOSIS — Z452 Encounter for adjustment and management of vascular access device: Secondary | ICD-10-CM

## 2016-01-13 DIAGNOSIS — B372 Candidiasis of skin and nail: Secondary | ICD-10-CM | POA: Diagnosis present

## 2016-01-13 DIAGNOSIS — I472 Ventricular tachycardia: Secondary | ICD-10-CM | POA: Diagnosis not present

## 2016-01-13 DIAGNOSIS — R609 Edema, unspecified: Secondary | ICD-10-CM

## 2016-01-13 DIAGNOSIS — R188 Other ascites: Secondary | ICD-10-CM | POA: Diagnosis present

## 2016-01-13 DIAGNOSIS — R6521 Severe sepsis with septic shock: Secondary | ICD-10-CM | POA: Diagnosis not present

## 2016-01-13 DIAGNOSIS — N184 Chronic kidney disease, stage 4 (severe): Secondary | ICD-10-CM | POA: Diagnosis present

## 2016-01-13 DIAGNOSIS — Z79899 Other long term (current) drug therapy: Secondary | ICD-10-CM

## 2016-01-13 DIAGNOSIS — I8310 Varicose veins of unspecified lower extremity with inflammation: Secondary | ICD-10-CM

## 2016-01-13 DIAGNOSIS — E871 Hypo-osmolality and hyponatremia: Secondary | ICD-10-CM | POA: Diagnosis present

## 2016-01-13 DIAGNOSIS — N185 Chronic kidney disease, stage 5: Secondary | ICD-10-CM | POA: Diagnosis not present

## 2016-01-13 DIAGNOSIS — J690 Pneumonitis due to inhalation of food and vomit: Secondary | ICD-10-CM | POA: Diagnosis not present

## 2016-01-13 DIAGNOSIS — R0602 Shortness of breath: Secondary | ICD-10-CM

## 2016-01-13 DIAGNOSIS — Z87891 Personal history of nicotine dependence: Secondary | ICD-10-CM

## 2016-01-13 DIAGNOSIS — E875 Hyperkalemia: Secondary | ICD-10-CM | POA: Diagnosis not present

## 2016-01-13 DIAGNOSIS — B1920 Unspecified viral hepatitis C without hepatic coma: Secondary | ICD-10-CM | POA: Diagnosis present

## 2016-01-13 DIAGNOSIS — K7469 Other cirrhosis of liver: Secondary | ICD-10-CM | POA: Diagnosis present

## 2016-01-13 DIAGNOSIS — D6959 Other secondary thrombocytopenia: Secondary | ICD-10-CM | POA: Diagnosis present

## 2016-01-13 DIAGNOSIS — Z515 Encounter for palliative care: Secondary | ICD-10-CM | POA: Diagnosis not present

## 2016-01-13 DIAGNOSIS — N320 Bladder-neck obstruction: Secondary | ICD-10-CM | POA: Diagnosis not present

## 2016-01-13 DIAGNOSIS — L0390 Cellulitis, unspecified: Secondary | ICD-10-CM | POA: Diagnosis present

## 2016-01-13 DIAGNOSIS — J9622 Acute and chronic respiratory failure with hypercapnia: Secondary | ICD-10-CM | POA: Diagnosis not present

## 2016-01-13 DIAGNOSIS — R7989 Other specified abnormal findings of blood chemistry: Secondary | ICD-10-CM

## 2016-01-13 DIAGNOSIS — I130 Hypertensive heart and chronic kidney disease with heart failure and stage 1 through stage 4 chronic kidney disease, or unspecified chronic kidney disease: Secondary | ICD-10-CM | POA: Diagnosis present

## 2016-01-13 DIAGNOSIS — I872 Venous insufficiency (chronic) (peripheral): Secondary | ICD-10-CM | POA: Diagnosis present

## 2016-01-13 DIAGNOSIS — E1151 Type 2 diabetes mellitus with diabetic peripheral angiopathy without gangrene: Secondary | ICD-10-CM | POA: Diagnosis present

## 2016-01-13 DIAGNOSIS — J9621 Acute and chronic respiratory failure with hypoxia: Secondary | ICD-10-CM | POA: Diagnosis not present

## 2016-01-13 DIAGNOSIS — I5033 Acute on chronic diastolic (congestive) heart failure: Secondary | ICD-10-CM | POA: Diagnosis present

## 2016-01-13 DIAGNOSIS — Z992 Dependence on renal dialysis: Secondary | ICD-10-CM

## 2016-01-13 DIAGNOSIS — Z86718 Personal history of other venous thrombosis and embolism: Secondary | ICD-10-CM | POA: Diagnosis not present

## 2016-01-13 DIAGNOSIS — D696 Thrombocytopenia, unspecified: Secondary | ICD-10-CM | POA: Diagnosis present

## 2016-01-13 DIAGNOSIS — M79606 Pain in leg, unspecified: Secondary | ICD-10-CM

## 2016-01-13 DIAGNOSIS — G9341 Metabolic encephalopathy: Secondary | ICD-10-CM | POA: Diagnosis present

## 2016-01-13 DIAGNOSIS — R570 Cardiogenic shock: Secondary | ICD-10-CM | POA: Diagnosis present

## 2016-01-13 DIAGNOSIS — K7290 Hepatic failure, unspecified without coma: Secondary | ICD-10-CM | POA: Diagnosis present

## 2016-01-13 DIAGNOSIS — Z6841 Body Mass Index (BMI) 40.0 and over, adult: Secondary | ICD-10-CM

## 2016-01-13 DIAGNOSIS — I10 Essential (primary) hypertension: Secondary | ICD-10-CM | POA: Diagnosis present

## 2016-01-13 DIAGNOSIS — Z66 Do not resuscitate: Secondary | ICD-10-CM | POA: Diagnosis not present

## 2016-01-13 DIAGNOSIS — Z95828 Presence of other vascular implants and grafts: Secondary | ICD-10-CM

## 2016-01-13 DIAGNOSIS — G4733 Obstructive sleep apnea (adult) (pediatric): Secondary | ICD-10-CM | POA: Diagnosis present

## 2016-01-13 DIAGNOSIS — L03119 Cellulitis of unspecified part of limb: Secondary | ICD-10-CM

## 2016-01-13 DIAGNOSIS — I2781 Cor pulmonale (chronic): Secondary | ICD-10-CM | POA: Diagnosis present

## 2016-01-13 DIAGNOSIS — I82409 Acute embolism and thrombosis of unspecified deep veins of unspecified lower extremity: Secondary | ICD-10-CM

## 2016-01-13 DIAGNOSIS — R1110 Vomiting, unspecified: Secondary | ICD-10-CM

## 2016-01-13 DIAGNOSIS — K7460 Unspecified cirrhosis of liver: Secondary | ICD-10-CM | POA: Diagnosis present

## 2016-01-13 DIAGNOSIS — R601 Generalized edema: Secondary | ICD-10-CM | POA: Diagnosis not present

## 2016-01-13 DIAGNOSIS — N186 End stage renal disease: Secondary | ICD-10-CM | POA: Insufficient documentation

## 2016-01-13 DIAGNOSIS — I509 Heart failure, unspecified: Secondary | ICD-10-CM

## 2016-01-13 DIAGNOSIS — N179 Acute kidney failure, unspecified: Secondary | ICD-10-CM | POA: Diagnosis present

## 2016-01-13 DIAGNOSIS — R5381 Other malaise: Secondary | ICD-10-CM | POA: Diagnosis not present

## 2016-01-13 DIAGNOSIS — R778 Other specified abnormalities of plasma proteins: Secondary | ICD-10-CM

## 2016-01-13 DIAGNOSIS — R768 Other specified abnormal immunological findings in serum: Secondary | ICD-10-CM | POA: Diagnosis present

## 2016-01-13 DIAGNOSIS — I4729 Other ventricular tachycardia: Secondary | ICD-10-CM

## 2016-01-13 DIAGNOSIS — Z419 Encounter for procedure for purposes other than remedying health state, unspecified: Secondary | ICD-10-CM

## 2016-01-13 DIAGNOSIS — N189 Chronic kidney disease, unspecified: Secondary | ICD-10-CM

## 2016-01-13 HISTORY — DX: Acute embolism and thrombosis of unspecified deep veins of unspecified lower extremity: I82.409

## 2016-01-13 HISTORY — DX: Nontraumatic subarachnoid hemorrhage, unspecified: I60.9

## 2016-01-13 HISTORY — DX: Traumatic subdural hemorrhage with loss of consciousness of unspecified duration, initial encounter: S06.5X9A

## 2016-01-13 HISTORY — DX: Venous insufficiency (chronic) (peripheral): I87.2

## 2016-01-13 HISTORY — DX: Ventricular tachycardia: I47.2

## 2016-01-13 HISTORY — DX: Unspecified intracranial injury with loss of consciousness of unspecified duration, initial encounter: S06.9X9A

## 2016-01-13 HISTORY — DX: Type 2 diabetes mellitus without complications: E11.9

## 2016-01-13 HISTORY — DX: Unspecified cirrhosis of liver: K74.60

## 2016-01-13 LAB — BASIC METABOLIC PANEL
Anion gap: 11 (ref 5–15)
BUN: 26 mg/dL — AB (ref 6–20)
CHLORIDE: 96 mmol/L — AB (ref 101–111)
CO2: 26 mmol/L (ref 22–32)
Calcium: 10 mg/dL (ref 8.9–10.3)
Creatinine, Ser: 2.82 mg/dL — ABNORMAL HIGH (ref 0.61–1.24)
GFR calc Af Amer: 24 mL/min — ABNORMAL LOW (ref >60)
GFR calc non Af Amer: 21 mL/min — ABNORMAL LOW (ref >60)
Glucose, Bld: 126 mg/dL — ABNORMAL HIGH (ref 65–99)
Potassium: 5.2 mmol/L — ABNORMAL HIGH (ref 3.5–5.1)
SODIUM: 133 mmol/L — AB (ref 135–145)

## 2016-01-13 LAB — GLUCOSE, CAPILLARY: Glucose-Capillary: 127 mg/dL — ABNORMAL HIGH (ref 65–99)

## 2016-01-13 LAB — CBC
HCT: 33.1 % — ABNORMAL LOW (ref 39.0–52.0)
HEMOGLOBIN: 11 g/dL — AB (ref 13.0–17.0)
MCH: 35.3 pg — AB (ref 26.0–34.0)
MCHC: 33.2 g/dL (ref 30.0–36.0)
MCV: 106.1 fL — AB (ref 78.0–100.0)
Platelets: 104 10*3/uL — ABNORMAL LOW (ref 150–400)
RBC: 3.12 MIL/uL — ABNORMAL LOW (ref 4.22–5.81)
RDW: 14.9 % (ref 11.5–15.5)
WBC: 7.4 10*3/uL (ref 4.0–10.5)

## 2016-01-13 LAB — I-STAT TROPONIN, ED: Troponin i, poc: 0.13 ng/mL (ref 0.00–0.08)

## 2016-01-13 LAB — TROPONIN I: Troponin I: 0.25 ng/mL — ABNORMAL HIGH (ref <0.031)

## 2016-01-13 MED ORDER — SPIRONOLACTONE 50 MG PO TABS: 50.0000 mg | ORAL_TABLET | Freq: Every day | ORAL | Status: DC

## 2016-01-13 MED ORDER — SODIUM CHLORIDE 0.9% FLUSH
3.0000 mL | Freq: Two times a day (BID) | INTRAVENOUS | Status: DC
  Administered 2016-01-13 – 2016-01-31 (×26): 3 mL via INTRAVENOUS

## 2016-01-13 MED ORDER — INSULIN ASPART 100 UNIT/ML ~~LOC~~ SOLN
0.0000 [IU] | Freq: Every day | SUBCUTANEOUS | Status: DC
  Administered 2016-01-24: 2 [IU] via SUBCUTANEOUS

## 2016-01-13 MED ORDER — SODIUM CHLORIDE 0.9 % IV SOLN
250.0000 mL | INTRAVENOUS | Status: DC | PRN
  Administered 2016-01-17: 18:00:00 via INTRAVENOUS

## 2016-01-13 MED ORDER — FUROSEMIDE 10 MG/ML IJ SOLN
60.0000 mg | Freq: Two times a day (BID) | INTRAMUSCULAR | Status: DC
  Administered 2016-01-13 – 2016-01-14 (×2): 60 mg via INTRAVENOUS
  Filled 2016-01-13 (×2): qty 6

## 2016-01-13 MED ORDER — LINAGLIPTIN 5 MG PO TABS: 5.0000 mg | ORAL_TABLET | Freq: Every day | ORAL | Status: DC

## 2016-01-13 MED ORDER — HEPARIN SODIUM (PORCINE) 5000 UNIT/ML IJ SOLN
5000.0000 [IU] | Freq: Three times a day (TID) | INTRAMUSCULAR | Status: DC
  Administered 2016-01-13 – 2016-01-14 (×2): 5000 [IU] via SUBCUTANEOUS
  Filled 2016-01-13 (×2): qty 1

## 2016-01-13 MED ORDER — INSULIN ASPART 100 UNIT/ML ~~LOC~~ SOLN
0.0000 [IU] | Freq: Three times a day (TID) | SUBCUTANEOUS | Status: DC
  Administered 2016-01-18: 1 [IU] via SUBCUTANEOUS
  Administered 2016-01-18 – 2016-01-19 (×5): 2 [IU] via SUBCUTANEOUS
  Administered 2016-01-20 (×2): 1 [IU] via SUBCUTANEOUS
  Administered 2016-01-20 – 2016-01-22 (×5): 2 [IU] via SUBCUTANEOUS
  Administered 2016-01-22: 1 [IU] via SUBCUTANEOUS
  Administered 2016-01-23 – 2016-01-24 (×3): 2 [IU] via SUBCUTANEOUS
  Administered 2016-01-24 – 2016-01-25 (×2): 3 [IU] via SUBCUTANEOUS
  Administered 2016-01-25: 2 [IU] via SUBCUTANEOUS
  Administered 2016-01-25: 3 [IU] via SUBCUTANEOUS
  Administered 2016-01-26 (×2): 2 [IU] via SUBCUTANEOUS
  Administered 2016-01-26: 1 [IU] via SUBCUTANEOUS
  Administered 2016-01-27 (×3): 2 [IU] via SUBCUTANEOUS
  Administered 2016-01-28 – 2016-01-31 (×8): 1 [IU] via SUBCUTANEOUS

## 2016-01-13 MED ORDER — SODIUM CHLORIDE 0.9% FLUSH: 3.0000 mL | INTRAVENOUS | Status: DC | PRN

## 2016-01-13 MED ORDER — ONDANSETRON HCL 4 MG/2ML IJ SOLN
4.0000 mg | Freq: Four times a day (QID) | INTRAMUSCULAR | Status: DC | PRN
  Administered 2016-01-17 – 2016-01-31 (×5): 4 mg via INTRAVENOUS
  Filled 2016-01-13 (×5): qty 2

## 2016-01-13 MED ORDER — ACETAMINOPHEN 325 MG PO TABS: 650.0000 mg | ORAL_TABLET | ORAL | Status: DC | PRN

## 2016-01-13 MED ORDER — CARVEDILOL 3.125 MG PO TABS
3.1250 mg | ORAL_TABLET | Freq: Two times a day (BID) | ORAL | Status: DC
  Administered 2016-01-14 – 2016-01-15 (×4): 3.125 mg via ORAL
  Filled 2016-01-13 (×5): qty 1

## 2016-01-13 MED ORDER — CEPHALEXIN 500 MG PO CAPS
500.0000 mg | ORAL_CAPSULE | Freq: Two times a day (BID) | ORAL | Status: DC
  Administered 2016-01-13: 500 mg via ORAL
  Filled 2016-01-13: qty 2

## 2016-01-13 NOTE — ED Notes (Signed)
MD at bedside. 

## 2016-01-13 NOTE — Consult Note (Signed)
Reason for Consult:mildly elevated troponin and anasarca Primary cardiologist: Dr. Wynonia Lawman Referring Physician: Dr. Irving Shows Maurice Paul is an 70 y.o. male.  HPI: Maurice Paul is a 70 yo man with PMH of PVD, hypertension, SDH after a fall in 2015 (march), prior DVT noted but not anticoagulated, CT abnormalities of liver suggestive of cirrhosis, T2DM who presents with 5-6 months of progressive weight gain, lethargy and diffuse anasarca. Maurice Paul denies acute changes in symptoms - no recent fever, chills, nausea/vomiting or diarrhea. He says he's had leg swelling for 10+ years but not the abdominal and thigh swelling he has noted over the past several months. He says that he has been having multiple tests evaluating his liver abnormalities and swelling with his PCP and no definitive diagnosis has been made. It seems that he's had poor appetite and nutrition since his SDH and TBI in March 2015. He is accompanied by his wife. He denies ever having chest pain. He is dyspneic with activity and has poor energy.   In the ER he was noted to have troponin of 0.13.   Past Medical History  Diagnosis Date  . PVD (peripheral vascular disease) ([place])   . Tobacco chew use     for 30 years  . Hypertension   . SDH (subdural hematoma) ([place]) 03/2014    fall  . DVT (deep venous thrombosis) ([place])     not on anticoagulation due to hematuria  . Stasis dermatitis   . Cirrhosis of liver ([place])   . Diabetes mellitus, type 2 [facility])     Past Surgical History  Procedure Laterality Date  . Cyst removal trunk    . Tonsillectomy    . Upper gi endoscopy  12/2015    Family History  Problem Relation Age of Onset  . Stroke Father     Social History:  reports that he quit smoking about 17 years ago. He has quit using smokeless tobacco. His smokeless tobacco use included Chew. He reports that he drinks alcohol. He reports that he does not use illicit drugs.  Allergies:  Allergies  Allergen Reactions  . Sulfa  Antibiotics     Hives  . Onglyza [Saxagliptin] Rash    Medications:  I have reviewed the patient's current medications. Prior to Admission:  (Not in a hospital admission) Scheduled: . [START ON 01/14/2016] carvedilol  3.125 mg Oral BID WC  . cephALEXin  500 mg Oral BID  . furosemide  60 mg Intravenous Q12H  . heparin  5,000 Units Subcutaneous 3 times per day  . insulin aspart  0-5 Units Subcutaneous QHS  . [START ON 01/14/2016] insulin aspart  0-9 Units Subcutaneous TID WC  . linagliptin  5 mg Oral Daily  . sodium chloride flush  3 mL Intravenous Q12H    Results for orders placed or performed during the hospital encounter of 01/13/16 (from the past 48 hour(s))  Basic metabolic panel     Status: Abnormal   Collection Time: 01/13/16  6:24 PM  Result Value Ref Range   Sodium 133 (L) 135 - 145 mmol/L   Potassium 5.2 (H) 3.5 - 5.1 mmol/L   Chloride 96 (L) 101 - 111 mmol/L   CO2 26 22 - 32 mmol/L   Glucose, Bld 126 (H) 65 - 99 mg/dL   BUN 26 (H) 6 - 20 mg/dL   Creatinine, Ser 2.82 (H) 0.61 - 1.24 mg/dL   Calcium 10.0 8.9 - 10.3 mg/dL   GFR calc non Af Amer 21 (L) >  60 mL/min   GFR calc Af Amer 24 (L) >60 mL/min    Comment: (NOTE) The eGFR has been calculated using the CKD EPI equation. This calculation has not been validated in all clinical situations. eGFR's persistently <60 mL/min signify possible Chronic Kidney Disease.    Anion gap 11 5 - 15  CBC     Status: Abnormal   Collection Time: 01/13/16  6:24 PM  Result Value Ref Range   WBC 7.4 4.0 - 10.5 K/uL   RBC 3.12 (L) 4.22 - 5.81 MIL/uL   Hemoglobin 11.0 (L) 13.0 - 17.0 g/dL   HCT 33.1 (L) 39.0 - 52.0 %   MCV 106.1 (H) 78.0 - 100.0 fL   MCH 35.3 (H) 26.0 - 34.0 pg   MCHC 33.2 30.0 - 36.0 g/dL   RDW 14.9 11.5 - 15.5 %   Platelets 104 (L) 150 - 400 K/uL    Comment: SPECIMEN CHECKED FOR CLOTS REPEATED TO VERIFY PLATELET COUNT CONFIRMED BY SMEAR   I-stat troponin, ED     Status: Abnormal   Collection Time: 01/13/16  6:38  PM  Result Value Ref Range   Troponin i, poc 0.13 (HH) 0.00 - 0.08 ng/mL   Comment NOTIFIED PHYSICIAN    Comment 3            Comment: Due to the release kinetics of cTnI, a negative result within the first hours of the onset of symptoms does not rule out myocardial infarction with certainty. If myocardial infarction is still suspected, repeat the test at appropriate intervals.     Dg Chest 2 View  01/13/2016  CLINICAL DATA:  Shortness of Breath EXAM: CHEST  2 VIEW COMPARISON:  01/08/2014 FINDINGS: Borderline cardiomegaly. Central mild vascular congestion without convincing pulmonary edema. There is small bilateral pleural effusion with bilateral basilar atelectasis or infiltrate. IMPRESSION: Central vascular congestion without convincing pulmonary edema. Small bilateral pleural effusion with bilateral basilar atelectasis or infiltrate. Electronically Signed   By: Lahoma Crocker M.D.   On: 01/13/2016 18:53    Review of Systems  Constitutional: Positive for malaise/fatigue. Negative for fever, chills and weight loss.  HENT: Negative for ear pain.   Eyes: Negative for double vision and photophobia.  Respiratory: Positive for shortness of breath. Negative for cough.   Cardiovascular: Positive for leg swelling. Negative for chest pain and palpitations.  Gastrointestinal: Negative for nausea, vomiting, abdominal pain, blood in stool and melena.  Genitourinary: Positive for hematuria. Negative for dysuria and urgency.  Musculoskeletal: Negative for myalgias and neck pain.  Skin: Positive for rash.       Chronic lower extremity skin changes  Neurological: Positive for weakness. Negative for tingling, tremors and headaches.  Endo/Heme/Allergies: Negative for polydipsia.  Psychiatric/Behavioral: Negative for suicidal ideas, hallucinations and substance abuse.   Blood pressure 124/43, pulse 87, temperature 97.7 F (36.5 C), temperature source Oral, resp. rate 20, SpO2 99 %. Physical Exam    Nursing note and vitals reviewed. Constitutional: He appears well-developed and well-nourished. No distress.  HENT:  Head: Normocephalic and atraumatic.  Nose: Nose normal.  Mouth/Throat: Oropharynx is clear and moist. No oropharyngeal exudate.  Eyes: Conjunctivae and EOM are normal. Pupils are equal, round, and reactive to light. No scleral icterus.  Neck: Normal range of motion. Neck supple. JVD present. No tracheal deviation present.  JVP ~ 10 cm, +HJR  Cardiovascular: Normal rate, regular rhythm and intact distal pulses.  Exam reveals no gallop.   Murmur heard. Systolic murmur at LSB, harsh S1  Respiratory: Effort normal and breath sounds normal. No respiratory distress. He has no wheezes. He has no rales.  GI: Soft. Bowel sounds are normal. He exhibits no distension. There is no tenderness.  Pitting edema noted in abdominal wall  Musculoskeletal: Normal range of motion. He exhibits edema.  Chronic 2+ edema to thighs bilaterally with chronic skin changes in feet/ankles/legs  Skin: Skin is warm and dry. Rash noted. He is not diaphoretic.  Psychiatric: He has a normal mood and affect. His behavior is normal. Judgment and thought content normal.  labs reviewed; na 133, K 5.2, bun/cr 26/2.82, glucose 126 Wbc 7.4, h/h 11/33, plt 104, troponin 0.13 abd [place] 3/16: nodular surface and contour consistent with cirrhosis Chest x-ray: bilateral increased congestion and bilateral pleural effusions EKG: inferior MI, age indeterminate, SR, nonspecific St change laterally    Assessment/Plan: Maurice Paul is a 70 yo man with PMH of PVD, hypertension, SDH after a fall in 2015 (march), prior DVT but not anticoagulated given previous history of hematuria, CT abnormalities of liver suggestive of cirrhosis, T2DM who presents with 5-6 months of progressive weight gain, lethargy and diffuse anasarca. Differential diagnosis is broad including low oncotic pressure state (low albumin/proteins) from liver  dysfunction or nutritional deficiency, renal failure with nephrotic syndrome, adrenal insufficiency, heart failure or cardiorenal syndrome, pulmonary hypertension from chronic pulmonary embolism among many other possibilities. Troponin likely mildly elevated given stress and demand ischemia state. For now, agree with attempt to diuresis and evaluate causes of volume overload, renal failure and possibility of hepatic involvement and previous suggestion of cirrhosis.   Problem List Diffuse anasarca Volume overload Acute on chronic renal failure ? Nutritional deficiency  Hyponatremia Hyperkalemia Thrombocytopenia Anemia ? Cirrhosis  Plan 1. Appreciate IM service 2. Obtain CMP, BNP, hba1c, urinalysis, urine protein/creatinine ratio, pre-albumin, TSH, lipid panel 3. Echocardiogram in AM, watch on telemetry, trend troponins; evaluate for causes of HF based on findings of echocardiogram such as hemochromatosis (with iron studies), thyroid disease (TSH) and HIV initially 4. Likely repeat abdominal imaging 5. Cortisol and cortisol stimulation test 6. Would obtain V/Q scan given renal failure  7. Abdominal US to evaluate thrombocytopenia with spleen imaging 8. Defer cirrhosis workup to primary team   Dre Gamino, [place]ola 01/13/2016, 10:23 PM

## 2016-01-13 NOTE — ED Provider Notes (Signed)
CSN: [id number]     Arrival date & time 01/13/16  1758 History   First MD Initiated Contact with Patient 01/13/16 1950     Chief Complaint  Patient presents with  . Shortness of Breath     HPI  Expand All Collapse All   Pt reports that he has been gaining 4-5lbs a week for the last month. Pt is experiencing exertional dyspnea x 2 weeks. Pt has obvious swelling to both legs and abd distention. Pt alert x4. NAD at this time.        Past Medical History  Diagnosis Date  . PVD (peripheral vascular disease) ([facility])   . Tobacco chew use     for 30 years  . Hypertension   . SDH (subdural hematoma) ([facility]) 03/2014    fall  . DVT (deep venous thrombosis) ([facility])     not on anticoagulation due to hematuria  . Stasis dermatitis   . Hepatitis C   . Cirrhosis of liver [facility])    Past Surgical History  Procedure Laterality Date  . Cyst removal trunk    . Tonsillectomy    . Upper gi endoscopy  12/2015   Family History  Problem Relation Age of Onset  . Stroke Father    Social History  Substance Use Topics  . Smoking status: Former Smoker    Quit date: 10/09/1998  . Smokeless tobacco: Former Neurosurgeon    Types: Chew  . Alcohol Use: 0.0 oz/week    0 Standard drinks or equivalent per week     Comment: Occasional    Review of Systems  Constitutional: Positive for unexpected weight change.  Respiratory: Positive for shortness of breath.   Cardiovascular: Negative for chest pain.  All other systems reviewed and are negative.     Allergies  Sulfa antibiotics  Home Medications   Prior to Admission medications   Medication Sig Start Date End Date Taking? Authorizing Provider  cloNIDine (CATAPRES - DOSED IN MG/24 HR) 0.1 mg/24hr patch Place 1 patch (0.1 mg total) onto the skin every Tuesday. Patient not taking: Reported on 01/11/2015 01/22/14   Mcarthur Rossetti Angiulli, PA-C  lisinopril (PRINIVIL,ZESTRIL) 10 MG tablet Take 10 mg by mouth daily.    Historical Provider, MD   BP 123/49 mmHg  Pulse  88  Temp(Src) 97.7 F (36.5 C) (Oral)  Resp 13  SpO2 97% Physical Exam  Constitutional: He is oriented to person, place, and time. He appears well-developed and well-nourished. No distress.  HENT:  Head: Normocephalic and atraumatic.  Eyes: Pupils are equal, round, and reactive to light.  Neck: Normal range of motion.  Cardiovascular: Normal rate and intact distal pulses.   Pulmonary/Chest: No respiratory distress. He has rales.  Abdominal: Normal appearance. He exhibits ascites. He exhibits no distension.  Musculoskeletal: Normal range of motion.       Right ankle: He exhibits swelling.       Left ankle: He exhibits swelling.  Neurological: He is alert and oriented to person, place, and time. No cranial nerve deficit.  Skin: Skin is warm and dry. No rash noted.  Psychiatric: He has a normal mood and affect. His behavior is normal.  Nursing note and vitals reviewed.   ED Course  Procedures (including critical care time) Labs Review Labs Reviewed  BASIC METABOLIC PANEL - Abnormal; Notable for the following:    Sodium 133 (*)    Potassium 5.2 (*)    Chloride 96 (*)    Glucose, Bld 126 (*)  BUN 26 (*)    Creatinine, Ser 2.82 (*)    GFR calc non Af Amer 21 (*)    GFR calc Af Amer 24 (*)    All other components within normal limits  CBC - Abnormal; Notable for the following:    RBC 3.12 (*)    Hemoglobin 11.0 (*)    HCT 33.1 (*)    MCV 106.1 (*)    MCH 35.3 (*)    Platelets 104 (*)    All other components within normal limits  I-STAT TROPOININ, ED - Abnormal; Notable for the following:    Troponin i, poc 0.13 (*)    All other components within normal limits  TROPONIN I  TROPONIN I  TROPONIN I    Imaging Review Dg Chest 2 View  01/13/2016  CLINICAL DATA:  Shortness of Breath EXAM: CHEST  2 VIEW COMPARISON:  01/08/2014 FINDINGS: Borderline cardiomegaly. Central mild vascular congestion without convincing pulmonary edema. There is small bilateral pleural effusion with  bilateral basilar atelectasis or infiltrate. IMPRESSION: Central vascular congestion without convincing pulmonary edema. Small bilateral pleural effusion with bilateral basilar atelectasis or infiltrate. Electronically Signed   By: Natasha MeadLiviu  Pop M.D.   On: 01/13/2016 18:53   I have personally reviewed and evaluated these images and lab results as part of my medical decision-making.   EKG Interpretation   Date/Time:  Thursday January 13 2016 18:14:56 EDT Ventricular Rate:  88 PR Interval:  144 QRS Duration: 98 QT Interval:  374 QTC Calculation: 452 R Axis:   52 Text Interpretation:  Normal sinus rhythm Cannot rule out Inferior infarct  , age undetermined Cannot rule out Anterior infarct , age undetermined  Abnormal ECG Confirmed by Payson Crumby  MD, Robertta Halfhill [phone]([id number]) on 01/13/2016 8:35:56  PM     I discussed with cardiology who will see the patient emergency room.  They requested admission to the hospital service.  Hospitalists were contacted and will see the patient. MDM   Final diagnoses:  Congestive heart failure, unspecified congestive heart failure chronicity, unspecified congestive heart failure type ([facility])  Acute renal injury ([facility])  Elevated troponin        Nelva Nayobert Annalisa Colonna, MD 01/13/16 2104

## 2016-01-13 NOTE — ED Notes (Signed)
Attempted report 

## 2016-01-13 NOTE — ED Notes (Signed)
Pt oxygen saturation dropped to 84% when moving from chair to bed. Pt placed on 2L via Nasal cannula.

## 2016-01-13 NOTE — ED Notes (Addendum)
Pt reports that he has been gaining 4-5lbs a week for the last month. Pt is experiencing exertional dyspnea x 2 weeks. Pt has obvious swelling to both legs and abd distention. Pt alert x4. NAD at this time.  Pt also reports cellulitis being treated on both legs with PO Keflex.

## 2016-01-13 NOTE — ED Notes (Signed)
Admitting at bedside 

## 2016-01-13 NOTE — H&P (Signed)
[facility] History and Physical   Patient: Maurice Paul EAV:[id number]   PCP: Redmond BasemanWONG,FRANCIS PATRICK, MD DOB: 04/14/1946   DOA: 01/13/2016   DOS: 01/13/2016   DOS: the patient was seen and examined on 01/13/2016  Referring physician: Dr. Radford PaxBeaton Chief Complaint: Weight gain  HPI: Maurice Paul is a 70 y.o. male with Past medical history of peripheral vascular disease, essential hypertension, chronic stasis dermatitis, type 2 diabetes mellitus not on any medication, cirrhosis of the liver nonalcoholic, history of DVT not on anticoagulation due to hematuria. The patient was brought from PCPs office. The patient was seen in PCPs office today for progressively worsening weight gain as well as shortness of breath as well as renal function. Patient mentions that he was 280 pounds last year. Since Christmas he has progressively gained weight roughly 5-6 pounds on a weekly basis. Patient is currently 340+ pounds. Patient also has chronic venous stasis dermatitis and recently has been given Keflex. Patient was also found to be having progressively worsening renal function as an outpatient from baseline serum creatinine 0.7-1.4 to more than 2 days admission. Patient mentions he has been urinating well denies having any burning urination. No diarrhea or constipation nausea or vomiting reported. In March 2017 the patient has undergone EGD which shows evidence of gastritis without any other acute abnormality as per the family. Patient was also seen by [facility] for suspected cirrhosis and has been started on Lasix and Aldactone. Due to progressive worsening of renal function PCP has been attempting to reduce lisinopril and completely stop it. Pt denies any complaint of chest pain or chest tightness.  The patient is coming from home  At his baseline ambulates without support And is independent for most of his ADL; manages his medication on his own.  Review of Systems: as mentioned in the history of  present illness.  A comprehensive review of the other systems is negative.  Past Medical History  Diagnosis Date  . PVD (peripheral vascular disease) (HCC)   . Tobacco chew use     for 30 years  . Hypertension   . SDH (subdural hematoma) (HCC) 03/2014    fall  . DVT (deep venous thrombosis) (HCC)     not on anticoagulation due to hematuria  . Stasis dermatitis   . Cirrhosis of liver (HCC)   . Diabetes mellitus, type 2 [facility](HCC)    Past Surgical History  Procedure Laterality Date  . Cyst removal trunk    . Tonsillectomy    . Upper gi endoscopy  12/2015   Social History:  reports that he quit smoking about 17 years ago. He has quit using smokeless tobacco. His smokeless tobacco use included Chew. He reports that he drinks alcohol. He reports that he does not use illicit drugs.  Allergies  Allergen Reactions  . Sulfa Antibiotics     Hives  . Onglyza [Saxagliptin] Rash    Family History  Problem Relation Age of Onset  . Stroke Father     Prior to Admission medications   Medication Sig Start Date End Date Taking? Authorizing Provider  amLODipine (NORVASC) 2.5 MG tablet Take 2.5 mg by mouth daily.   Yes Historical Provider, MD  cephALEXin (KEFLEX) 500 MG capsule Take 500 mg by mouth 2 (two) times daily.   Yes Historical Provider, MD  furosemide (LASIX) 20 MG tablet Take 20 mg by mouth daily.   Yes Historical Provider, MD  lisinopril (PRINIVIL,ZESTRIL) 10 MG tablet Take 10 mg by mouth daily.  Yes Historical Provider, MD  spironolactone (ALDACTONE) 100 MG tablet Take 100 mg by mouth daily.   Yes Historical Provider, MD  triamcinolone (KENALOG) 0.025 % cream  01/08/16  Yes Historical Provider, MD  ONGLYZA 2.5 MG TABS tablet  01/12/16   Historical Provider, MD    Physical Exam: Filed Vitals:   01/13/16 2015 01/13/16 2100 01/13/16 2115 01/13/16 2130  BP: 123/49 118/48 103/87 118/46  Pulse: 88 90 86 87  Temp:      TempSrc:      Resp: SpO2: 97% 93% 98% 96%     General: Alert, Awake and Oriented to Time, Place and Person. Appear in moderate distress Eyes: PERRL ENT: Oral Mucosa clear moist. Neck: Difficult to assess JVD Cardiovascular: S1 and S2 Present, aortic systolic Murmur, Peripheral Pulses Present Respiratory: Bilateral Air entry equal and Decreased,  Clear to Auscultation, no Crackles, no wheezes Abdomen: Bowel Sound present, Soft and distended, no tenderness Skin: Bilateral venous stasis dermatitis Rash Extremities: Bilateral Pedal edema, no calf tenderness Neurologic: Grossly no focal neuro deficit, negative asterixis  Labs on Admission:  CBC:  Recent Labs Lab 01/13/16 1824  WBC 7.4  HGB 11.0*  HCT 33.1*  MCV 106.1*  PLT 104*    CMP     Component Value Date/Time   NA 133* 01/13/2016 1824   K 5.2* 01/13/2016 1824   CL 96* 01/13/2016 1824   CO2 26 01/13/2016 1824   GLUCOSE 126* 01/13/2016 1824   BUN 26* 01/13/2016 1824   CREATININE 2.82* 01/13/2016 1824   CALCIUM 10.0 01/13/2016 1824   PROT 7.0 01/09/2014 0505   ALBUMIN 2.7* 01/09/2014 0505   AST 48* 01/09/2014 0505   ALT 23 01/09/2014 0505   ALKPHOS 66 01/09/2014 0505   BILITOT 0.7 01/09/2014 0505   GFRNONAA 21* 01/13/2016 1824   GFRAA 24* 01/13/2016 1824    No results for input(s): CKTOTAL, CKMB, CKMBINDEX, TROPONINI in the last 168 hours. BNP (last 3 results) No results for input(s): BNP in the last 8760 hours.  ProBNP (last 3 results) No results for input(s): PROBNP in the last 8760 hours.   Radiological Exams on Admission: Dg Chest 2 View  01/13/2016  CLINICAL DATA:  Shortness of Breath EXAM: CHEST  2 VIEW COMPARISON:  01/08/2014 FINDINGS: Borderline cardiomegaly. Central mild vascular congestion without convincing pulmonary edema. There is small bilateral pleural effusion with bilateral basilar atelectasis or infiltrate. IMPRESSION: Central vascular congestion without convincing pulmonary edema. Small bilateral pleural effusion with bilateral basilar  atelectasis or infiltrate. Electronically Signed   By: Natasha Mead M.D.   On: 01/13/2016 18:53   EKG: Independently reviewed. normal sinus rhythm, nonspecific ST and T waves changes.  Assessment/Plan 1. Acute on chronic diastolic CHF (congestive heart failure) (HCC) The patient is presenting with numbness of progressively worsening weight gain. Also has progressively worsening renal function. Cardiology has been consulted and appreciated input. This appears to be most likely in the setting of progressively decompensated cirrhosis. Patient has been on Lasix and Aldactone. At present due to hyperkalemia I would reduce the dose of Decadron from 100-50. Patient will be on 60 mg every 12 hours of Lasix. We'll monitor the renal function closely and the renal function if improves we will increase the dose of Lasix. Foley catheter will be inserted for close monitoring of ins and outs. Echocardiogram will be done in the morning.  2. Elevated troponin. Most likely in the setting of acute on chronic CHF as well as acute  kidney injury due to poor clearance. We will monitor clinically. Add aspirin. Echocardiogram in the morning.  3. Acute kidney injury. Probably cardiorenal hemodynamics. Possibility of hepatorenal syndrome cannot be ruled out as well. We will need to monitor ins and outs closely here Daily BMP.  4. Nonalcoholic cirrhosis of the liver. Patient mentions he had extensive workup as an outpatient and also has been following up with the GI. I'll get an ultrasound-guided paracentesis since the patient never had this done. Check INR check ammonia level.  5. Prior history of DVT. Given the patient's significant stasis dermatitis I will check ultrasound lower extremity Doppler.  6. Cellulitis. Improving at present no evidence of acute worsening. I would finish the course of Keflex for 1-1/2 day.  7. Essential hypertension. Discontinue ACE inhibitor. Continue other  medications.  8. History of type 2 diabetes mellitus. Next and not on any medication at present. Patient claims his A1c has been less than 7. I will check A1c here.  Nutrition: Cardiac diet DVT Prophylaxis: subcutaneous Heparin  Advance goals of care discussion: Full code   Consults: Cardiology  Family Communication: family was present at bedside, at the time of interview.  Opportunity was given to ask question and all questions were answered satisfactorily.  Disposition: Admitted as inpatient, telemetry unit.  Author: Lynden Oxford, MD [facility] Pager: [phone] 01/13/2016  If 7PM-7AM, please contact night-coverage [email or web address] Password [email or web address]

## 2016-01-14 ENCOUNTER — Inpatient Hospital Stay (HOSPITAL_COMMUNITY): Payer: Medicare Other

## 2016-01-14 ENCOUNTER — Encounter (HOSPITAL_COMMUNITY): Payer: Self-pay | Admitting: *Deleted

## 2016-01-14 ENCOUNTER — Other Ambulatory Visit (HOSPITAL_COMMUNITY): Payer: Medicare Other

## 2016-01-14 ENCOUNTER — Other Ambulatory Visit: Payer: Medicare Other

## 2016-01-14 DIAGNOSIS — R7989 Other specified abnormal findings of blood chemistry: Secondary | ICD-10-CM | POA: Diagnosis present

## 2016-01-14 DIAGNOSIS — L0390 Cellulitis, unspecified: Secondary | ICD-10-CM | POA: Diagnosis present

## 2016-01-14 DIAGNOSIS — E1122 Type 2 diabetes mellitus with diabetic chronic kidney disease: Secondary | ICD-10-CM

## 2016-01-14 DIAGNOSIS — R609 Edema, unspecified: Secondary | ICD-10-CM

## 2016-01-14 DIAGNOSIS — R778 Other specified abnormalities of plasma proteins: Secondary | ICD-10-CM | POA: Diagnosis present

## 2016-01-14 DIAGNOSIS — N179 Acute kidney failure, unspecified: Secondary | ICD-10-CM | POA: Diagnosis present

## 2016-01-14 DIAGNOSIS — N184 Chronic kidney disease, stage 4 (severe): Secondary | ICD-10-CM

## 2016-01-14 DIAGNOSIS — M79606 Pain in leg, unspecified: Secondary | ICD-10-CM

## 2016-01-14 LAB — URINALYSIS, ROUTINE W REFLEX MICROSCOPIC
BILIRUBIN URINE: NEGATIVE
GLUCOSE, UA: NEGATIVE mg/dL
HGB URINE DIPSTICK: NEGATIVE
KETONES UR: NEGATIVE mg/dL
LEUKOCYTES UA: NEGATIVE
NITRITE: NEGATIVE
PH: 5 (ref 5.0–8.0)
Protein, ur: NEGATIVE mg/dL
SPECIFIC GRAVITY, URINE: 1.011 (ref 1.005–1.030)

## 2016-01-14 LAB — COMPREHENSIVE METABOLIC PANEL
ALBUMIN: 3.4 g/dL — AB (ref 3.5–5.0)
ALBUMIN: 3.7 g/dL (ref 3.5–5.0)
ALK PHOS: 56 U/L (ref 38–126)
ALK PHOS: 64 U/L (ref 38–126)
ALT: 24 U/L (ref 17–63)
ALT: 23 U/L (ref 17–63)
ANION GAP: 8 (ref 5–15)
AST: 46 U/L — ABNORMAL HIGH (ref 15–41)
AST: 41 U/L (ref 15–41)
Anion gap: 11 (ref 5–15)
BILIRUBIN TOTAL: 2.1 mg/dL — AB (ref 0.3–1.2)
BILIRUBIN TOTAL: 2.1 mg/dL — AB (ref 0.3–1.2)
BUN: 26 mg/dL — AB (ref 6–20)
BUN: 27 mg/dL — AB (ref 6–20)
CALCIUM: 9.9 mg/dL (ref 8.9–10.3)
CO2: 26 mmol/L (ref 22–32)
CO2: 27 mmol/L (ref 22–32)
CREATININE: 2.78 mg/dL — AB (ref 0.61–1.24)
Calcium: 9.3 mg/dL (ref 8.9–10.3)
Chloride: 96 mmol/L — ABNORMAL LOW (ref 101–111)
Chloride: 96 mmol/L — ABNORMAL LOW (ref 101–111)
Creatinine, Ser: 2.79 mg/dL — ABNORMAL HIGH (ref 0.61–1.24)
GFR calc Af Amer: 25 mL/min — ABNORMAL LOW (ref >60)
GFR calc Af Amer: 25 mL/min — ABNORMAL LOW (ref >60)
GFR calc non Af Amer: 21 mL/min — ABNORMAL LOW (ref >60)
GFR calc non Af Amer: 21 mL/min — ABNORMAL LOW (ref >60)
GLUCOSE: 118 mg/dL — AB (ref 65–99)
GLUCOSE: 127 mg/dL — AB (ref 65–99)
POTASSIUM: 5 mmol/L (ref 3.5–5.1)
Potassium: 4.7 mmol/L (ref 3.5–5.1)
Sodium: 130 mmol/L — ABNORMAL LOW (ref 135–145)
Sodium: 134 mmol/L — ABNORMAL LOW (ref 135–145)
TOTAL PROTEIN: 7.3 g/dL (ref 6.5–8.1)
TOTAL PROTEIN: 8.2 g/dL — AB (ref 6.5–8.1)

## 2016-01-14 LAB — CBC WITH DIFFERENTIAL/PLATELET
BASOS ABS: 0 10*3/uL (ref 0.0–0.1)
BASOS PCT: 0 %
EOS PCT: 2 %
Eosinophils Absolute: 0.2 10*3/uL (ref 0.0–0.7)
HEMATOCRIT: 30.3 % — AB (ref 39.0–52.0)
Hemoglobin: 9.6 g/dL — ABNORMAL LOW (ref 13.0–17.0)
Lymphocytes Relative: 13 %
Lymphs Abs: 0.9 10*3/uL (ref 0.7–4.0)
MCH: 33.3 pg (ref 26.0–34.0)
MCHC: 31.7 g/dL (ref 30.0–36.0)
MCV: 105.2 fL — AB (ref 78.0–100.0)
MONO ABS: 0.6 10*3/uL (ref 0.1–1.0)
Monocytes Relative: 9 %
NEUTROS ABS: 5.2 10*3/uL (ref 1.7–7.7)
Neutrophils Relative %: 75 %
PLATELETS: 104 10*3/uL — AB (ref 150–400)
RBC: 2.88 MIL/uL — AB (ref 4.22–5.81)
RDW: 14.9 % (ref 11.5–15.5)
WBC: 6.9 10*3/uL (ref 4.0–10.5)

## 2016-01-14 LAB — RETICULOCYTES
RBC.: 2.99 MIL/uL — AB (ref 4.22–5.81)
RETIC COUNT ABSOLUTE: 35.9 10*3/uL (ref 19.0–186.0)
Retic Ct Pct: 1.2 % (ref 0.4–3.1)

## 2016-01-14 LAB — TROPONIN I
Troponin I: 0.3 ng/mL — ABNORMAL HIGH
Troponin I: 0.25 ng/mL — ABNORMAL HIGH

## 2016-01-14 LAB — BRAIN NATRIURETIC PEPTIDE: B Natriuretic Peptide: 510.5 pg/mL — ABNORMAL HIGH (ref 0.0–100.0)

## 2016-01-14 LAB — LACTATE DEHYDROGENASE: LDH: 205 U/L — ABNORMAL HIGH (ref 98–192)

## 2016-01-14 LAB — TSH: TSH: 1.81 u[IU]/mL (ref 0.350–4.500)

## 2016-01-14 LAB — GLUCOSE, CAPILLARY
GLUCOSE-CAPILLARY: 114 mg/dL — AB (ref 65–99)
GLUCOSE-CAPILLARY: 114 mg/dL — AB (ref 65–99)
Glucose-Capillary: 121 mg/dL — ABNORMAL HIGH (ref 65–99)

## 2016-01-14 LAB — PROTEIN / CREATININE RATIO, URINE
Creatinine, Urine: 132.38 mg/dL
Protein Creatinine Ratio: 0.22 mg/mg{Cre} — ABNORMAL HIGH (ref 0.00–0.15)
Total Protein, Urine: 29 mg/dL

## 2016-01-14 LAB — AMMONIA: Ammonia: 66 umol/L — ABNORMAL HIGH (ref 9–35)

## 2016-01-14 LAB — PROTIME-INR
INR: 1.46 (ref 0.00–1.49)
Prothrombin Time: 17.8 seconds — ABNORMAL HIGH (ref 11.6–15.2)

## 2016-01-14 LAB — SODIUM, URINE, RANDOM: Sodium, Ur: 60 mmol/L

## 2016-01-14 MED ORDER — LACTULOSE 10 GM/15ML PO SOLN
10.0000 g | Freq: Two times a day (BID) | ORAL | Status: DC
  Administered 2016-01-14 – 2016-01-17 (×7): 10 g via ORAL
  Filled 2016-01-14 (×6): qty 15

## 2016-01-14 MED ORDER — ENOXAPARIN SODIUM 40 MG/0.4ML ~~LOC~~ SOLN
40.0000 mg | SUBCUTANEOUS | Status: DC
  Administered 2016-01-14 – 2016-01-17 (×3): 40 mg via SUBCUTANEOUS
  Filled 2016-01-14 (×3): qty 0.4

## 2016-01-14 MED ORDER — ASPIRIN EC 325 MG PO TBEC
325.0000 mg | DELAYED_RELEASE_TABLET | Freq: Every day | ORAL | Status: DC
  Administered 2016-01-14 – 2016-01-17 (×4): 325 mg via ORAL
  Filled 2016-01-14 (×4): qty 1

## 2016-01-14 MED ORDER — FUROSEMIDE 10 MG/ML IJ SOLN
80.0000 mg | Freq: Two times a day (BID) | INTRAMUSCULAR | Status: DC
  Administered 2016-01-14 – 2016-01-16 (×4): 80 mg via INTRAVENOUS
  Filled 2016-01-14 (×4): qty 8

## 2016-01-14 MED ORDER — PANTOPRAZOLE SODIUM 40 MG PO TBEC
40.0000 mg | DELAYED_RELEASE_TABLET | Freq: Every day | ORAL | Status: DC
  Administered 2016-01-14 – 2016-01-17 (×4): 40 mg via ORAL
  Filled 2016-01-14 (×4): qty 1

## 2016-01-14 NOTE — Progress Notes (Signed)
PATIENT DETAILS Name: Maurice Paul Age: 70 y.o. Sex: male Date of Birth: October 25, 1945 Admit Date: 01/13/2016 Admitting Physician Rolly Salter, MD ZOX:[id number] PATRICK, MD  Subjective: Still with significant leg swelling. Also has exertional dyspnea.  Assessment/Plan: Principal Problem:  Anasarca: Suspect this is multifactorial-not sure if there is a CHF component, he does have a history of cirrhosis as well. Does have some mild proteinuria. Await further urine studies, await echocardiogram, continue diuresis with IV furosemide.  Active Problems: Chronic kidney disease stage IV: With anasarca. Apparently 2 years back had normal renal function, awaiting records from PCPs office and [facility] gastroenterology to see what his trend of creatinine has been over the past few months. Does have some mild protein urea with RBCs-a repeat UA. Will send out urine protein creatinine ratio and other urine studies.. Renal ultrasound negative for hydronephrosis. Etiology of renal failure is unknown at this time, nephrology has been consulted.  Liver cirrhosis-cryptogenic: Apparently has had extensive workup with [facility]-per patient workup has been negative. He has refused a liver biopsy in the past. We are awaiting records from [facility]. No significant ascites seen on abdominal ultrasound.  Mild hyponatremia: Secondary to hypervolemia, should improve with diuresis.  Minimally elevated troponins: Trend is flat, not consistent with ACS. Likely demand ischemia or false positive elevation in a setting of CKD. Await echo  Thrombocytopenia: Likely secondary to liver cirrhosis. Follow.  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 Follow. Check anemia panel  Chronic venous stasis dermatitis: See no evidence of cellulitis, discontinue Keflex. Lower extremity Dopplers pending  Hypertension: Continue Coreg, will continue to hold lisinopril given CKD-avoid amlodipine  given lower extremity edema-continue to hold Aldactone in the setting of advanced kidney disease. Follow.  Type 2 diabetes: Hold all oral hypoglycemic agents, CBG stable with SSI.  History of traumatic subarachnoid hemorrhage/subdural hemorrhage March 2015  History of left peroneal DVT 2015-provoked-during prolonged hospitalization due to traumatic SAH/SDH  Disposition: Remain inpatient  Antimicrobial agents  See below  Anti-infectives    Start     Dose/Rate Route Frequency Ordered Stop   01/13/16 2200  cephALEXin (KEFLEX) capsule 500 mg     500 mg Oral 2 times daily 01/13/16 2148 01/15/16 0959      DVT Prophylaxis: Prophylactic Lovenox-watch platelets  Code Status: Full code  Family Communication None at bedside  Procedures: None  CONSULTS:  cardiology and nephrology  Time spent 30 minutes-Greater than 50% of this time was spent in counseling, explanation of diagnosis, planning of further management, and coordination of care.  MEDICATIONS: Scheduled Meds: . aspirin EC  325 mg Oral Daily  . carvedilol  3.125 mg Oral BID WC  . cephALEXin  500 mg Oral BID  . furosemide  60 mg Intravenous Q12H  . heparin  5,000 Units Subcutaneous 3 times per day  . insulin aspart  0-5 Units Subcutaneous QHS  . insulin aspart  0-9 Units Subcutaneous TID WC  . lactulose  10 g Oral BID  . linagliptin  5 mg Oral Daily  . pantoprazole  40 mg Oral Daily  . sodium chloride flush  3 mL Intravenous Q12H   Continuous Infusions:  PRN Meds:.sodium chloride, acetaminophen, ondansetron (ZOFRAN) IV, sodium chloride flush    PHYSICAL EXAM: Vital signs in last 24 hours: Filed Vitals:   01/13/16 2313 01/13/16 2318 01/14/16 0440 01/14/16 0827  BP:  106/62 124/43 134/60  Pulse:  95 91 92  Temp:  98.8 F (37.1 C) 97.7 F (36.5 C)   TempSrc:  Oral Oral   Resp:  20 19   Height:  (1.778 m)     Weight: 152.953 kg (337 lb 3.2 oz)  152.5 kg (336 lb 3.2 oz)   SpO2:  94% 96%      Weight change:  Filed Weights   01/13/16 2228 01/13/16 2313 01/14/16 0440  Weight: 154.404 kg (340 lb 6.4 oz) 152.953 kg (337 lb 3.2 oz) 152.5 kg (336 lb 3.2 oz)   Body mass index is 48.24 kg/(m^2).   Gen Exam: Awake and alert with clear speech.   Neck: Supple, No JVD.  Chest: B/L Clear.   CVS: S1 S2 Regular, no murmurs.  Abdomen: soft, BS +, non tender, distended.  Extremities: +++ edema, lower extremities warm to touch. Chronic venous stasis changes bilaterally Neurologic: Non Focal.  Skin: No Rash.   Wounds: N/A.    Intake/Output from previous day:  Intake/Output Summary (Last 24 hours) at 01/14/16 1054 Last data filed at 01/14/16 0855  Gross per 24 hour  Intake     50 ml  Output    200 ml  Net   -150 ml    LAB RESULTS: CBC  Recent Labs Lab 01/13/16 1824 01/14/16 0633  WBC 7.4 6.9  HGB 11.0* 9.6*  HCT 33.1* 30.3*  PLT 104* 104*  MCV 106.1* 105.2*  MCH 35.3* 33.3  MCHC 33.2 31.7  RDW 14.9 14.9  LYMPHSABS  --  0.9  MONOABS  --  0.6  EOSABS  --  0.2  BASOSABS  --  0.0    Chemistries   Recent Labs Lab 01/13/16 1824 01/14/16 0025 01/14/16 0633  NA 133* 130* 134*  K 5.2* 4.7 5.0  CL 96* 96* 96*  CO2 GLUCOSE 126* 127* 118*  BUN 26* 26* 27*  CREATININE 2.82* 2.79* 2.78*  CALCIUM 10.0 9.9 9.3    CBG:  Recent Labs Lab 01/13/16 2341  GLUCAP 127*    GFR Estimated Creatinine Clearance: 36.1 mL/min (by C-G formula based on Cr of 2.78).  Coagulation profile  Recent Labs Lab 01/14/16 0025  INR 1.46    Cardiac Enzymes  Recent Labs Lab 01/13/16 2236 01/14/16 0259 01/14/16 0633  TROPONINI 0.25* 0.30* 0.25*    Invalid input(s): POCBNP No results for input(s): DDIMER in the last 72 hours. No results for input(s): HGBA1C in the last 72 hours. No results for input(s): CHOL, HDL, LDLCALC, TRIG, CHOLHDL, LDLDIRECT in the last 72 hours.  Recent Labs  01/14/16 0633  TSH 1.810   No results for input(s): VITAMINB12, FOLATE,  FERRITIN, TIBC, IRON, RETICCTPCT in the last 72 hours. No results for input(s): LIPASE, AMYLASE in the last 72 hours.  Urine Studies No results for input(s): UHGB, CRYS in the last 72 hours.  Invalid input(s): UACOL, UAPR, USPG, UPH, UTP, UGL, UKET, UBIL, UNIT, UROB, ULEU, UEPI, UWBC, URBC, UBAC, CAST, UCOM, BILUA  MICROBIOLOGY: No results found for this or any previous visit (from the past 240 hour(s)).  RADIOLOGY STUDIES/RESULTS: Dg Chest 2 View  01/13/2016  CLINICAL DATA:  Shortness of Breath EXAM: CHEST  2 VIEW COMPARISON:  01/08/2014 FINDINGS: Borderline cardiomegaly. Central mild vascular congestion without convincing pulmonary edema. There is small bilateral pleural effusion with bilateral basilar atelectasis or infiltrate. IMPRESSION: Central vascular congestion without convincing pulmonary edema. Small bilateral pleural effusion with bilateral basilar atelectasis or infiltrate. Electronically Signed   By: Lanette Hampshire.D.  On: 01/13/2016 18:53   [place] Renal  01/14/2016  CLINICAL DATA:  Chronic renal disease EXAM: RENAL / URINARY TRACT ULTRASOUND COMPLETE COMPARISON:  CT abdomen and pelvis December 01, 2014 FINDINGS: Right Kidney: Length: 13.9 cm. Echogenicity and renal cortical thickness are within normal limits. No perinephric fluid or hydronephrosis visualized. There is a cyst arising from the lower pole of the right kidney measuring 1.8 x 1.9 x 1.9 cm. No sonographically demonstrable calculus or ureterectasis. Left Kidney: Length: 12.8 cm. Echogenicity and renal cortical thickness are within normal limits. No mass, perinephric fluid, or hydronephrosis visualized. No sonographically demonstrable calculus or ureterectasis. Bladder: Appears normal for degree of bladder distention. Incidental note is made of a small splenule adjacent to the spleen. A small amount of ascites is noted near the liver edge. IMPRESSION: Small right renal cyst.  Kidneys otherwise appear unremarkable. Small amount of  ascites near the liver edge. Small accessory spleen noted incidentally. Electronically Signed   By: Bretta BangWilliam  Woodruff III M.D.   On: 01/14/2016 10:37   [place] Abdomen Limited  01/14/2016  CLINICAL DATA:  Hepatic cirrhosis with abdominal distention EXAM: LIMITED ABDOMEN ULTRASOUND FOR ASCITES TECHNIQUE: Limited ultrasound survey for ascites was performed in all four abdominal quadrants. COMPARISON:  CT abdomen and pelvis December 01, 2014 FINDINGS: There is rather minimal ascites adjacent to the liver. Peristalsing bowel is seen elsewhere. The contour of the liver is nodular with increased liver echogenicity consistent with underlying hepatic cirrhosis. IMPRESSION: Rather minimal amount of ascites.  Hepatic cirrhosis. Electronically Signed   By: Bretta BangWilliam  Woodruff III M.D.   On: 01/14/2016 10:31    Jeoffrey MassedGHIMIRE,Zeus Marquis, MD  [facility] Pager:[phone]  If 7PM-7AM, please contact night-coverage [email or web address] Password [email or web address] 01/14/2016, 10:54 AM   LOS: 1 day

## 2016-01-14 NOTE — Progress Notes (Signed)
Subjective:  The patient was seen by me about 2 years ago for evaluation of syncope.  At that time he had an echocardiogram showing concentric LVH with normal systolic function and an unremarkable event monitor.  He also had evidence of chronic venous stasis.  I haven't really seen him since then.  He evidently has developed progressive lower extremity edema and is also had significant erythema of both lower extremities.  He was managed with Unna boots by his primary doctor earlier this year and more recently has seen a gastroenterologist because of an abnormal abdominal CT showing cirrhosis.  He recently had an endoscopy and was noted to have hypoxemia during the endoscopy.  He saw his primary physician yesterday who noted marked weight gain as well as anasarca who sent him over for evaluation here.  He has recently been treated with Aldactone.  He doesn't really have much in the way of shortness of breath but is noticed increased abdominal girth and lower extremity swelling to the point that he cannot get his shoes on anymore.  He doesn't really have any significant chest pain.  Objective:  Vital Signs in the last 24 hours: BP 134/60 mmHg  Pulse 92  Temp(Src) 97.7 F (36.5 C) (Oral)  Resp 19  Ht  (1.778 m)  Wt 152.5 kg (336 lb 3.2 oz)  BMI 48.24 kg/m2  SpO2 96%  Physical Exam: Morbidly obese white male currently in no acute distress Lungs:  Clear  Cardiac:  Regular rhythm, normal S1 and S2, no S3, no murmur Abdomen: Quite distended Extremities:  4+ edema noted with brawny erythema extending all the way up the lower extremities  Intake/Output from previous day: 04/06 0701 - 04/07 0700 In: -  Out: 200 [Urine:200] Weight Filed Weights   01/13/16 2228 01/13/16 2313 01/14/16 0440  Weight: 154.404 kg (340 lb 6.4 oz) 152.953 kg (337 lb 3.2 oz) 152.5 kg (336 lb 3.2 oz)    Lab Results: Basic Metabolic Panel:  Recent Labs  78/29/56 0025 01/14/16 0633  NA 130* 134*  K 4.7 5.0   CL 96* 96*  CO2 26 27  GLUCOSE 127* 118*  BUN 26* 27*  CREATININE 2.79* 2.78*    CBC:  Recent Labs  01/13/16 1824 01/14/16 0633  WBC 7.4 6.9  NEUTROABS  --  5.2  HGB 11.0* 9.6*  HCT 33.1* 30.3*  MCV 106.1* 105.2*  PLT 104* 104*   PROTIME: Lab Results  Component Value Date   INR 1.46 01/14/2016   INR 1.10 12/24/2013    Telemetry: Normal sinus rhythm  Assessment/Plan:  1.  Marked lower extremity edema that is likely multifactorial.  At this point unclear whether it is due to right heart failure, cirrhosis, or significant chronic venous stasis or lymphedema. 2.  Prior diagnosis of cirrhosis questionable etiology but evidently has refused liver biopsy 3.  Diabetes mellitus 4.  Morbid obesity 5.  Thrombocytopenia, macrocytosis, mildly low albumin and evidence of cirrhosis of which could be markers of cirrhosis 6.  Renal failure unclear whether it is subacute as his creatinine was completely normal 2 years ago 7.  History of DVT previously at the time of subarachnoid hemorrhage 8.  History of traumatic subdural hemorrhage  Recommendations:  Obtain BNP level.  Discussed with his primary inpatient physician.  I would recommend a renal consultation and continue intravenous diuresis at the present time.  Obtain echocardiogram today as well as lower extremity Doppler evaluation.  Follow renal function.  Evaluate and obtain further records  regarding the cirrhosis.  Diagnostic paracentesis today.  We'll follow along with you.    Darden PalmerW. Spencer Joe Gee, Jr.  MD [facility] Cardiology  01/14/2016, 8:39 AM

## 2016-01-14 NOTE — Care Management Note (Signed)
Case Management Note  Patient Details  Name: Jewel BaizeWalter S Crumble MRN: [id number] Date of Birth: 11/17/1945  Subjective/Objective:                    Action/Plan: Patient was admitted with progressive weight gain, shortness of breath; CHF, AKI.  Lives at home with spouse. Will follow for discharge needs pending PT/OT evals and physician orders.  Expected Discharge Date:                  Expected Discharge Plan:     In-House Referral:     Discharge planning Services     Post Acute Care Choice:    Choice offered to:     DME Arranged:    DME Agency:     HH Arranged:    HH Agency:     Status of Service:  In process, will continue to follow  Medicare Important Message Given:    Date Medicare IM Given:    Medicare IM give by:    Date Additional Medicare IM Given:    Additional Medicare Important Message give by:     If discussed at Long Length of Stay Meetings, dates discussed:    Additional Comments:  Anda KraftRobarge, Denim Start C, RN 01/14/2016, 1:45 PM [phone]

## 2016-01-14 NOTE — Progress Notes (Signed)
*  PRELIMINARY RESULTS* Vascular Ultrasound Lower extremity venous duplex has been completed.  Preliminary findings: No evidence of DVT in visualized veins or baker's cyst.   Farrel DemarkJill Eunice, RDMS, RVT  01/14/2016, 3:48 PM

## 2016-01-14 NOTE — Consult Note (Signed)
Maurice Paul is an 70 y.o. male referred by Dr Jerral Ralph   Chief Complaint: Subacute ARF HPI: 70yo WM admitted test for volume overload.  Denies any known hx renal insufficiency.  No hx gross hematuria, no NSAID's, no obstructive type Sxs, no FH renal ds.  Denies HTN though listed on his PMH.  Says he was dx with "pre diabetes" and put on trajenta but subsequently stopped it.  Scr 7/16 was .74, 11/26/15 1.08, 12/03/15 1.35. On admission Scr 2.82 and today 2.78.  Renal US shows Rt 13.9CM, small cyst no hydro, Lt 12.8 no hydro, Nl echogenicity.  He has been on lisinopril and aldactone PTA.  No hypotensive episodes.  I/O have been incomplete.  His Hg and plt ct have been low since at least February though Hg lower now.  Past Medical History  Diagnosis Date  . PVD (peripheral vascular disease) (HCC)   . Tobacco chew use     for 30 years  . Hypertension   . SDH (subdural hematoma) (HCC) 03/2014    fall  . DVT (deep venous thrombosis) (HCC)     not on anticoagulation due to hematuria  . Stasis dermatitis   . Cirrhosis of liver (HCC)   . Diabetes mellitus, type 2 [facility]     Past Surgical History  Procedure Laterality Date  . Cyst removal trunk    . Tonsillectomy    . Upper gi endoscopy  12/2015    Family History  Problem Relation Age of Onset  . Stroke Father   FH neg for renal ds  Social History:  reports that he quit smoking about 17 years ago. He has quit using smokeless tobacco. His smokeless tobacco use included Chew. He reports that he drinks alcohol. He reports that he does not use illicit drugs.  Allergies:  Allergies  Allergen Reactions  . Sulfa Antibiotics     Hives  . Onglyza [Saxagliptin] Rash    Medications Prior to Admission  Medication Sig Dispense Refill  . amLODipine (NORVASC) 2.5 MG tablet Take 2.5 mg by mouth daily.    . cephALEXin (KEFLEX) 500 MG capsule Take 500 mg by mouth 2 (two) times daily.    . furosemide (LASIX) 20 MG tablet Take 20 mg by mouth daily.     Marland Kitchen lisinopril (PRINIVIL,ZESTRIL) 10 MG tablet Take 10 mg by mouth daily.    Marland Kitchen spironolactone (ALDACTONE) 100 MG tablet Take 100 mg by mouth daily.    Marland Kitchen triamcinolone (KENALOG) 0.025 % cream     . ONGLYZA 2.5 MG TABS tablet        Lab Results: UA: ND   Recent Labs  01/13/16 1824 01/14/16 0633  WBC 7.4 6.9  HGB 11.0* 9.6*  HCT 33.1* 30.3*  PLT 104* 104*   BMET  Recent Labs  01/13/16 1824 01/14/16 0025 01/14/16 0633  NA 133* 130* 134*  K 5.2* 4.7 5.0  CL 96* 96* 96*  CO2 GLUCOSE 126* 127* 118*  BUN 26* 26* 27*  CREATININE 2.82* 2.79* 2.78*  CALCIUM 10.0 9.9 9.3   LFT  Recent Labs  01/14/16 0633  PROT 7.3  ALBUMIN 3.4*  AST 41  ALT 23  ALKPHOS 56  BILITOT 2.1*   Dg Chest 2 View  01/13/2016  CLINICAL DATA:  Shortness of Breath EXAM: CHEST  2 VIEW COMPARISON:  01/08/2014 FINDINGS: Borderline cardiomegaly. Central mild vascular congestion without convincing pulmonary edema. There is small bilateral pleural effusion with bilateral basilar atelectasis or infiltrate.  IMPRESSION: Central vascular congestion without convincing pulmonary edema. Small bilateral pleural effusion with bilateral basilar atelectasis or infiltrate. Electronically Signed   By: Natasha MeadLiviu  Pop M.D.   On: 01/13/2016 18:53   [place] Renal  01/14/2016  CLINICAL DATA:  Chronic renal disease EXAM: RENAL / URINARY TRACT ULTRASOUND COMPLETE COMPARISON:  CT abdomen and pelvis December 01, 2014 FINDINGS: Right Kidney: Length: 13.9 cm. Echogenicity and renal cortical thickness are within normal limits. No perinephric fluid or hydronephrosis visualized. There is a cyst arising from the lower pole of the right kidney measuring 1.8 x 1.9 x 1.9 cm. No sonographically demonstrable calculus or ureterectasis. Left Kidney: Length: 12.8 cm. Echogenicity and renal cortical thickness are within normal limits. No mass, perinephric fluid, or hydronephrosis visualized. No sonographically demonstrable calculus or ureterectasis.  Bladder: Appears normal for degree of bladder distention. Incidental note is made of a small splenule adjacent to the spleen. A small amount of ascites is noted near the liver edge. IMPRESSION: Small right renal cyst.  Kidneys otherwise appear unremarkable. Small amount of ascites near the liver edge. Small accessory spleen noted incidentally. Electronically Signed   By: Bretta BangWilliam  Woodruff III M.D.   On: 01/14/2016 10:37   [place] Abdomen Limited  01/14/2016  CLINICAL DATA:  Hepatic cirrhosis with abdominal distention EXAM: LIMITED ABDOMEN ULTRASOUND FOR ASCITES TECHNIQUE: Limited ultrasound survey for ascites was performed in all four abdominal quadrants. COMPARISON:  CT abdomen and pelvis December 01, 2014 FINDINGS: There is rather minimal ascites adjacent to the liver. Peristalsing bowel is seen elsewhere. The contour of the liver is nodular with increased liver echogenicity consistent with underlying hepatic cirrhosis. IMPRESSION: Rather minimal amount of ascites.  Hepatic cirrhosis. Electronically Signed   By: Bretta BangWilliam  Woodruff III M.D.   On: 01/14/2016 10:31    ROS: No change in vision + SOB No CP No change in bowels No dysuria or change in UO No new arthritic CO  PHYSICAL EXAM: Blood pressure 100/37, pulse 66, temperature 97.7 F (36.5 C), temperature source Oral, resp. rate 18, height 5\' 10"  (1.778 m), weight 152.5 kg (336 lb 3.2 oz), SpO2 92 %. HEENT: PERRLA, EOMI.  Sclera non icteric NECK:Mild JVD LUNGS:Decreased BS bases, clear CARDIAC:RRR wo MRG ZOX:WRUEAABD:Obese, + BS NTND + subQ edema EXT:4+ edema with chronic venous stasis changes NEURO:CNI M&SI, Ox3 no asterixis  Assessment: 1. Subacute renal insufficiency that I suspect is mostly hemodynamically mediated due to liver ds and ACE +/- Rt Ht failure 2. Volume overload 3. Cirrhosis 4. DM 5.  Anemia and thrombocytopenia raises issue of TTP  PLAN: 1. UA and further eval based on that 2. Avoid ACE/ARB for now 3. Leave off  aldactone 4. Will adjust IV lasix based on response 5. Daily Scr 6. PBS for schistocytes 7. Daily CBC   Michall Noffke T 01/14/2016, 11:53 AM

## 2016-01-15 ENCOUNTER — Inpatient Hospital Stay (HOSPITAL_COMMUNITY): Payer: Medicare Other

## 2016-01-15 LAB — CBC
HEMATOCRIT: 30 % — AB (ref 39.0–52.0)
HEMOGLOBIN: 9.7 g/dL — AB (ref 13.0–17.0)
MCH: 34.2 pg — ABNORMAL HIGH (ref 26.0–34.0)
MCHC: 32.3 g/dL (ref 30.0–36.0)
MCV: 105.6 fL — ABNORMAL HIGH (ref 78.0–100.0)
Platelets: 96 10*3/uL — ABNORMAL LOW (ref 150–400)
RBC: 2.84 MIL/uL — ABNORMAL LOW (ref 4.22–5.81)
RDW: 15 % (ref 11.5–15.5)
WBC: 6.8 10*3/uL (ref 4.0–10.5)

## 2016-01-15 LAB — URINE MICROSCOPIC-ADD ON
RBC / HPF: NONE SEEN RBC/hpf (ref 0–5)
WBC UA: NONE SEEN WBC/hpf (ref 0–5)

## 2016-01-15 LAB — GLUCOSE, CAPILLARY
GLUCOSE-CAPILLARY: 119 mg/dL — AB (ref 65–99)
GLUCOSE-CAPILLARY: 114 mg/dL — AB (ref 65–99)
GLUCOSE-CAPILLARY: 105 mg/dL — AB (ref 65–99)
Glucose-Capillary: 103 mg/dL — ABNORMAL HIGH (ref 65–99)

## 2016-01-15 LAB — URINALYSIS, ROUTINE W REFLEX MICROSCOPIC
GLUCOSE, UA: 100 mg/dL — AB
Hgb urine dipstick: NEGATIVE
Ketones, ur: 15 mg/dL — AB
LEUKOCYTES UA: NEGATIVE
NITRITE: NEGATIVE
PH: 5.5 (ref 5.0–8.0)
Protein, ur: >300 mg/dL — AB
SPECIFIC GRAVITY, URINE: 1.024 (ref 1.005–1.030)

## 2016-01-15 LAB — IRON AND TIBC
IRON: 91 ug/dL (ref 45–182)
SATURATION RATIOS: 38 % (ref 17.9–39.5)
TIBC: 242 ug/dL — AB (ref 250–450)
UIBC: 151 ug/dL

## 2016-01-15 LAB — ECHOCARDIOGRAM COMPLETE
HEIGHTINCHES: 70 in
Weight: 5342.4 oz

## 2016-01-15 LAB — RENAL FUNCTION PANEL
ALBUMIN: 3.4 g/dL — AB (ref 3.5–5.0)
Anion gap: 11 (ref 5–15)
BUN: 31 mg/dL — ABNORMAL HIGH (ref 6–20)
CHLORIDE: 100 mmol/L — AB (ref 101–111)
CO2: 26 mmol/L (ref 22–32)
Calcium: 9.4 mg/dL (ref 8.9–10.3)
Creatinine, Ser: 3.14 mg/dL — ABNORMAL HIGH (ref 0.61–1.24)
GFR, EST AFRICAN AMERICAN: 21 mL/min — AB (ref >60)
GFR, EST NON AFRICAN AMERICAN: 18 mL/min — AB (ref >60)
GLUCOSE: 110 mg/dL — AB (ref 65–99)
POTASSIUM: 5.4 mmol/L — AB (ref 3.5–5.1)
Phosphorus: 5 mg/dL — ABNORMAL HIGH (ref 2.5–4.6)
SODIUM: 137 mmol/L (ref 135–145)

## 2016-01-15 LAB — HEMOGLOBIN A1C
Hgb A1c MFr Bld: 5.9 % — ABNORMAL HIGH (ref 4.8–5.6)
MEAN PLASMA GLUCOSE: 123 mg/dL

## 2016-01-15 LAB — PROTEIN / CREATININE RATIO, URINE
Creatinine, Urine: 336.34 mg/dL
PROTEIN CREATININE RATIO: 3.1 mg/mg{creat} — AB (ref 0.00–0.15)
TOTAL PROTEIN, URINE: 1044 mg/dL

## 2016-01-15 LAB — VITAMIN B12: VITAMIN B 12: 406 pg/mL (ref 180–914)

## 2016-01-15 LAB — FERRITIN: FERRITIN: 374 ng/mL — AB (ref 24–336)

## 2016-01-15 LAB — RETICULOCYTES
RBC.: 2.84 MIL/uL — ABNORMAL LOW (ref 4.22–5.81)
RETIC CT PCT: 1.3 % (ref 0.4–3.1)
Retic Count, Absolute: 36.9 10*3/uL (ref 19.0–186.0)

## 2016-01-15 LAB — FOLATE: Folate: 9.2 ng/mL (ref >5.9)

## 2016-01-15 LAB — HAPTOGLOBIN: HAPTOGLOBIN: 85 mg/dL (ref 34–200)

## 2016-01-15 MED ORDER — LORAZEPAM 0.5 MG PO TABS
0.5000 mg | ORAL_TABLET | Freq: Once | ORAL | Status: AC
  Administered 2016-01-15: 0.5 mg via ORAL
  Filled 2016-01-15: qty 1

## 2016-01-15 MED ORDER — SODIUM POLYSTYRENE SULFONATE 15 GM/60ML PO SUSP
30.0000 g | Freq: Once | ORAL | Status: AC
  Administered 2016-01-15: 30 g via ORAL
  Filled 2016-01-15: qty 120

## 2016-01-15 NOTE — Progress Notes (Signed)
Subjective:  No complaints today of chest pain or shortness of breath.  Renal function has worsened since yesterday.  He began around 4 pounds.  No echo was done yesterday and asked it to be done early today.  Objective:  Vital Signs in the last 24 hours: BP 119/55 mmHg  Pulse 79  Temp(Src) 98.5 F (36.9 C) (Oral)  Resp 18  Ht 5\' 10"  (1.778 m)  Wt 151.456 kg (333 lb 14.4 oz)  BMI 47.91 kg/m2  SpO2 93%  Physical Exam: Morbidly obese white male currently in no acute distress Lungs:  Clear  Cardiac:  Regular rhythm, normal S1 and S2, no S3, no murmur Abdomen: Quite distended Extremities:  4+ edema noted with brawny erythema extending all the way up the lower extremities  Intake/Output from previous day: 04/07 0701 - 04/08 0700 In: 50 [P.O.:50] Out: 501 [Urine:500; Stool:1] Weight Filed Weights   01/13/16 2313 01/14/16 0440 01/15/16 0542  Weight: 152.953 kg (337 lb 3.2 oz) 152.5 kg (336 lb 3.2 oz) 151.456 kg (333 lb 14.4 oz)    Lab Results: Basic Metabolic Panel:  Recent Labs  16/07/9603/07/17 0633 01/15/16 0345  NA 134* 137  K 5.0 5.4*  CL 96* 100*  CO2 27 26  GLUCOSE 118* 110*  BUN 27* 31*  CREATININE 2.78* 3.14*    CBC:  Recent Labs  01/14/16 0633 01/15/16 0345  WBC 6.9 6.8  NEUTROABS 5.2  --   HGB 9.6* 9.7*  HCT 30.3* 30.0*  MCV 105.2* 105.6*  PLT 104* 96*   PROTIME: Lab Results  Component Value Date   INR 1.46 01/14/2016   INR 1.10 12/24/2013    Telemetry: Normal sinus rhythm  Assessment/Plan:  1.  Marked lower extremity edema that is likely multifactorial.  At this point unclear whether it is due to right heart failure, cirrhosis, or significant chronic venous stasis or lymphedema. 2.  Prior diagnosis of cirrhosis questionable etiology but evidently has refused liver biopsy 3.  Diabetes mellitus 4.  Morbid obesity 5.  Thrombocytopenia, macrocytosis, mildly low albumin and evidence of cirrhosis of which could be markers of cirrhosis 6.  Renal  failureThat is worsening continued to be managed by nephrologists  7.  History of DVT previously at the time of subarachnoid hemorrhage 8.  History of traumatic subdural hemorrhage  Recommendations:  I will review the echo today.  We can continue his diuresis today but need to be careful in light of his kidney dysfunction.   Darden PalmerW. Spencer Tilley, Jr.  MD [facility]  01/15/2016, 11:03 AM

## 2016-01-15 NOTE — Progress Notes (Signed)
S: No new CO except flatulance O:BP 119/55 mmHg  Pulse 79  Temp(Src) 98.5 F (36.9 C) (Oral)  Resp 18  Ht 5\' 10"  (1.778 m)  Wt 151.456 kg (333 lb 14.4 oz)  BMI 47.91 kg/m2  SpO2 93%  Intake/Output Summary (Last 24 hours) at 01/15/16 0838 Last data filed at 01/14/16 1844  Gross per 24 hour  Intake      0 ml  Output    501 ml  Net   -501 ml   Weight change: -2.948 kg (-6 lb 8 oz) ZOX:WRUEAGen:awake and alert CVS:RRR Resp:Decreased BS bases Abd:+ BS NTND + subQ edema Ext: 4+ edema NEURO: CNI, OX3, no asterixis   . aspirin EC  325 mg Oral Daily  . carvedilol  3.125 mg Oral BID WC  . enoxaparin (LOVENOX) injection  40 mg Subcutaneous Q24H  . furosemide  80 mg Intravenous Q12H  . insulin aspart  0-5 Units Subcutaneous QHS  . insulin aspart  0-9 Units Subcutaneous TID WC  . lactulose  10 g Oral BID  . pantoprazole  40 mg Oral Daily  . sodium chloride flush  3 mL Intravenous Q12H  . sodium polystyrene  30 g Oral Once   Dg Chest 2 View  01/13/2016  CLINICAL DATA:  Shortness of Breath EXAM: CHEST  2 VIEW COMPARISON:  01/08/2014 FINDINGS: Borderline cardiomegaly. Central mild vascular congestion without convincing pulmonary edema. There is small bilateral pleural effusion with bilateral basilar atelectasis or infiltrate. IMPRESSION: Central vascular congestion without convincing pulmonary edema. Small bilateral pleural effusion with bilateral basilar atelectasis or infiltrate. Electronically Signed   By: Natasha MeadLiviu  Pop M.D.   On: 01/13/2016 18:53   [place] Renal  01/14/2016  CLINICAL DATA:  Chronic renal disease EXAM: RENAL / URINARY TRACT ULTRASOUND COMPLETE COMPARISON:  CT abdomen and pelvis December 01, 2014 FINDINGS: Right Kidney: Length: 13.9 cm. Echogenicity and renal cortical thickness are within normal limits. No perinephric fluid or hydronephrosis visualized. There is a cyst arising from the lower pole of the right kidney measuring 1.8 x 1.9 x 1.9 cm. No sonographically demonstrable calculus or  ureterectasis. Left Kidney: Length: 12.8 cm. Echogenicity and renal cortical thickness are within normal limits. No mass, perinephric fluid, or hydronephrosis visualized. No sonographically demonstrable calculus or ureterectasis. Bladder: Appears normal for degree of bladder distention. Incidental note is made of a small splenule adjacent to the spleen. A small amount of ascites is noted near the liver edge. IMPRESSION: Small right renal cyst.  Kidneys otherwise appear unremarkable. Small amount of ascites near the liver edge. Small accessory spleen noted incidentally. Electronically Signed   By: Bretta BangWilliam  Woodruff III M.D.   On: 01/14/2016 10:37   [place] Abdomen Limited  01/14/2016  CLINICAL DATA:  Hepatic cirrhosis with abdominal distention EXAM: LIMITED ABDOMEN ULTRASOUND FOR ASCITES TECHNIQUE: Limited ultrasound survey for ascites was performed in all four abdominal quadrants. COMPARISON:  CT abdomen and pelvis December 01, 2014 FINDINGS: There is rather minimal ascites adjacent to the liver. Peristalsing bowel is seen elsewhere. The contour of the liver is nodular with increased liver echogenicity consistent with underlying hepatic cirrhosis. IMPRESSION: Rather minimal amount of ascites.  Hepatic cirrhosis. Electronically Signed   By: Bretta BangWilliam  Woodruff III M.D.   On: 01/14/2016 10:31   BMET    Component Value Date/Time   NA 137 01/15/2016 0345   K 5.4* 01/15/2016 0345   CL 100* 01/15/2016 0345   CO2 26 01/15/2016 0345   GLUCOSE 110* 01/15/2016 0345   BUN 31*  01/15/2016 0345   CREATININE 3.14* 01/15/2016 0345   CALCIUM 9.4 01/15/2016 0345   GFRNONAA 18* 01/15/2016 0345   GFRAA 21* 01/15/2016 0345   CBC    Component Value Date/Time   WBC 6.8 01/15/2016 0345   RBC 2.84* 01/15/2016 0345   RBC 2.84* 01/15/2016 0345   HGB 9.7* 01/15/2016 0345   HCT 30.0* 01/15/2016 0345   PLT 96* 01/15/2016 0345   MCV 105.6* 01/15/2016 0345   MCH 34.2* 01/15/2016 0345   MCHC 32.3 01/15/2016 0345   RDW 15.0  01/15/2016 0345   LYMPHSABS 0.9 01/14/2016 0633   MONOABS 0.6 01/14/2016 0633   EOSABS 0.2 01/14/2016 0633   BASOSABS 0.0 01/14/2016 1610     Assessment: 1. Subacute renal insuff probably related to hemodynamic changes due to liver ds and ACE +/- Rt heart failure.  Thrombocytopenia/anemia raises ? of TTP.  I don't see that pathology has looked at PBS ?? 2. Volume overload 3. Cirrhosis 4. DM 5. Mild hyperkalemia  Plan: 1.  Consider asking hematology to see pt to evaluate for schistocytes 2. Cont diuresis 3. Await echo 4. Daily labs 5. I discussed placement of a foley if he cannot save his urine.  Spoke to nurse about getting UA 6. Change to renal diet   Maurice Paul T

## 2016-01-15 NOTE — Progress Notes (Signed)
Patient voided 50 ml urine and urine specimen sent to lab.   Bladder scan showed zero urine in bladder post void ,however patient abd so large unable to get a good read. MD called and made aware . Patient voices  no c/o noted. Vss A&ox4. Will continue to monitor patient.

## 2016-01-15 NOTE — Progress Notes (Signed)
Dr Arlean HoppingSchertz called informed of patient only voided 50 ml on this shift so far. Bladder scan completed with zero urine  found in bladder however patient abd so distended unable to attain a good read from bladder scan. Order received to place foley.

## 2016-01-15 NOTE — Progress Notes (Signed)
  Echocardiogram 2D Echocardiogram has been performed.  Delcie RochENNINGTON, Mariene Dickerman 01/15/2016, 9:21 AM

## 2016-01-15 NOTE — Progress Notes (Addendum)
PATIENT DETAILS Name: Maurice BaizeWalter S Paul Age: 70 y.o. Sex: male Date of Birth: 07/18/1946 Admit Date: 01/13/2016 Admitting Physician Rolly SalterPranav M Patel, MD ZDG:[id number]:WONG,FRANCIS PATRICK, MD  Subjective: Still with significant leg swelling.  Assessment/Plan: Principal Problem: Anasarca: Suspect this is multifactorial-unclear if he has Right sided failure,he does have a history of cirrhosis. Await echocardiogram, continue diuresis with IV furosemide.  Active Problems: DGL:OVFIEARF:[facility]-it appears that ARF started around 2 months back.Renal following, suspicion for hemodynamic mediated renal failure. Renal ultrasound negative for hydronephrosis.Repeat UA on 4/7 neg for proteinuria.  Liver cirrhosis-cryptogenic: Apparently has had extensive workup with [facility]-per patient workup has been negative. He has refused a liver biopsy in the past. No significant ascites seen on abdominal ultrasound.  Hyperkalemia: Kayexalate X 1-follow lytes tomorrow  Mild hyponatremia: Resolved-Secondary to hypervolemia, should improve with diuresis.  Minimally elevated troponins: Trend is flat, not consistent with ACS. Likely demand ischemia or false positive elevation in a setting of CKD. Await echo  Thrombocytopenia: Likely secondary to liver cirrhosis. Spoke with Dr Myrle ShengSherill who reviewed labs, chart over the phone-did not think patient had a TTP, he thinks that this is from liver cirrhoses as well.  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  Spoke with Dr Myrle ShengSherill who reviewed labs, chart over the phone-did not think patient had a TTP, he thinks that this is from liver cirrhoses as well.  Chronic venous stasis dermatitis: See no evidence of cellulitis, discontinue Keflex. Lower extremity Dopplers negative for DVT  Hypertension: Continue Coreg, will continue to hold lisinopril, amlodipine and Aldactone. Follow.  Type 2 diabetes: Hold all oral  hypoglycemic agents, CBG stable with SSI.  History of traumatic subarachnoid hemorrhage/subdural hemorrhage March 2015  History of left peroneal DVT 2015-provoked-during prolonged hospitalization due to traumatic SAH/SDH  Disposition: Remain inpatient  Antimicrobial agents  See below  Anti-infectives    Start     Dose/Rate Route Frequency Ordered Stop   01/13/16 2200  cephALEXin (KEFLEX) capsule 500 mg  Status:  Discontinued     500 mg Oral 2 times daily 01/13/16 2148 01/14/16 1100      DVT Prophylaxis: Prophylactic Lovenox-watch platelets  Code Status: Full code  Family Communication None at bedside  Procedures: None  CONSULTS:  cardiology and nephrology  Time spent 30 minutes-Greater than 50% of this time was spent in counseling, explanation of diagnosis, planning of further management, and coordination of care.  MEDICATIONS: Scheduled Meds: . aspirin EC  325 mg Oral Daily  . carvedilol  3.125 mg Oral BID WC  . enoxaparin (LOVENOX) injection  40 mg Subcutaneous Q24H  . furosemide  80 mg Intravenous Q12H  . insulin aspart  0-5 Units Subcutaneous QHS  . insulin aspart  0-9 Units Subcutaneous TID WC  . lactulose  10 g Oral BID  . pantoprazole  40 mg Oral Daily  . sodium chloride flush  3 mL Intravenous Q12H   Continuous Infusions:  PRN Meds:.sodium chloride, acetaminophen, ondansetron (ZOFRAN) IV, sodium chloride flush    PHYSICAL EXAM: Vital signs in last 24 hours: Filed Vitals:   01/14/16 1701 01/14/16 2019 01/15/16 0054 01/15/16 0542  BP: 107/59 115/59 102/48 119/55  Pulse:  67 68 79  Temp:  98.1 F (36.7 C) 98 F (36.7 C) 98.5 F (36.9 C)  TempSrc:  Oral Oral Oral  Resp:  18 18 18   Height:      Weight:  151.456 kg (333 lb 14.4 oz)  SpO2:  93% 91% 93%    Weight change: -2.948 kg (-6 lb 8 oz) Filed Weights   01/13/16 2313 01/14/16 0440 01/15/16 0542  Weight: 152.953 kg (337 lb 3.2 oz) 152.5 kg (336 lb 3.2 oz) 151.456 kg (333 lb 14.4 oz)    Body mass index is 47.91 kg/(m^2).   Gen Exam: Awake and alert with clear speech.   Neck: Supple, No JVD.  Chest: B/L Clear.   CVS: S1 S2 Regular, no murmurs.  Abdomen: soft, BS +, non tender, distended.  Extremities: +++ edema, lower extremities warm to touch. Chronic venous stasis changes bilaterally Neurologic: Non Focal.  Skin: No Rash.   Wounds: N/A.    Intake/Output from previous day:  Intake/Output Summary (Last 24 hours) at 01/15/16 1129 Last data filed at 01/15/16 0900  Gross per 24 hour  Intake    240 ml  Output    101 ml  Net    139 ml    LAB RESULTS: CBC  Recent Labs Lab 01/13/16 1824 01/14/16 0633 01/15/16 0345  WBC 7.4 6.9 6.8  HGB 11.0* 9.6* 9.7*  HCT 33.1* 30.3* 30.0*  PLT 104* 104* 96*  MCV 106.1* 105.2* 105.6*  MCH 35.3* 33.3 34.2*  MCHC 33.2 31.7 32.3  RDW 14.9 14.9 15.0  LYMPHSABS  --  0.9  --   MONOABS  --  0.6  --   EOSABS  --  0.2  --   BASOSABS  --  0.0  --     Chemistries   Recent Labs Lab 01/13/16 1824 01/14/16 0025 01/14/16 0633 01/15/16 0345  NA 133* 130* 134* 137  K 5.2* 4.7 5.0 5.4*  CL 96* 96* 96* 100*  CO2 GLUCOSE 126* 127* 118* 110*  BUN 26* 26* 27* 31*  CREATININE 2.82* 2.79* 2.78* 3.14*  CALCIUM 10.0 9.9 9.3 9.4    CBG:  Recent Labs Lab 01/13/16 2341 01/14/16 1121 01/14/16 1656 01/14/16 2111 01/15/16 0547  GLUCAP 127* 121* 114* 114* 103*    GFR Estimated Creatinine Clearance: 31.9 mL/min (by C-G formula based on Cr of 3.14).  Coagulation profile  Recent Labs Lab 01/14/16 0025  INR 1.46    Cardiac Enzymes  Recent Labs Lab 01/13/16 2236 01/14/16 0259 01/14/16 0633  TROPONINI 0.25* 0.30* 0.25*    Invalid input(s): POCBNP No results for input(s): DDIMER in the last 72 hours.  Recent Labs  01/14/16 0025  HGBA1C 5.9*   No results for input(s): CHOL, HDL, LDLCALC, TRIG, CHOLHDL, LDLDIRECT in the last 72 hours.  Recent Labs  01/14/16 0633  TSH 1.810    Recent  Labs  01/14/16 1807 01/15/16 0345  VITAMINB12  --  406  FOLATE  --  9.2  FERRITIN  --  374*  TIBC  --  242*  IRON  --  91  RETICCTPCT 1.2 1.3   No results for input(s): LIPASE, AMYLASE in the last 72 hours.  Urine Studies No results for input(s): UHGB, CRYS in the last 72 hours.  Invalid input(s): UACOL, UAPR, USPG, UPH, UTP, UGL, UKET, UBIL, UNIT, UROB, ULEU, UEPI, UWBC, URBC, UBAC, CAST, UCOM, BILUA  MICROBIOLOGY: No results found for this or any previous visit (from the past 240 hour(s)).  RADIOLOGY STUDIES/RESULTS: Dg Chest 2 View  01/13/2016  CLINICAL DATA:  Shortness of Breath EXAM: CHEST  2 VIEW COMPARISON:  01/08/2014 FINDINGS: Borderline cardiomegaly. Central mild vascular congestion without convincing pulmonary edema. There is small  bilateral pleural effusion with bilateral basilar atelectasis or infiltrate. IMPRESSION: Central vascular congestion without convincing pulmonary edema. Small bilateral pleural effusion with bilateral basilar atelectasis or infiltrate. Electronically Signed   By: Natasha Mead M.D.   On: 01/13/2016 18:53   US Renal  01/14/2016  CLINICAL DATA:  Chronic renal disease EXAM: RENAL / URINARY TRACT ULTRASOUND COMPLETE COMPARISON:  CT abdomen and pelvis December 01, 2014 FINDINGS: Right Kidney: Length: 13.9 cm. Echogenicity and renal cortical thickness are within normal limits. No perinephric fluid or hydronephrosis visualized. There is a cyst arising from the lower pole of the right kidney measuring 1.8 x 1.9 x 1.9 cm. No sonographically demonstrable calculus or ureterectasis. Left Kidney: Length: 12.8 cm. Echogenicity and renal cortical thickness are within normal limits. No mass, perinephric fluid, or hydronephrosis visualized. No sonographically demonstrable calculus or ureterectasis. Bladder: Appears normal for degree of bladder distention. Incidental note is made of a small splenule adjacent to the spleen. A small amount of ascites is noted near the liver  edge. IMPRESSION: Small right renal cyst.  Kidneys otherwise appear unremarkable. Small amount of ascites near the liver edge. Small accessory spleen noted incidentally. Electronically Signed   By: Bretta Bang III M.D.   On: 01/14/2016 10:37   US Abdomen Limited  01/14/2016  CLINICAL DATA:  Hepatic cirrhosis with abdominal distention EXAM: LIMITED ABDOMEN ULTRASOUND FOR ASCITES TECHNIQUE: Limited ultrasound survey for ascites was performed in all four abdominal quadrants. COMPARISON:  CT abdomen and pelvis December 01, 2014 FINDINGS: There is rather minimal ascites adjacent to the liver. Peristalsing bowel is seen elsewhere. The contour of the liver is nodular with increased liver echogenicity consistent with underlying hepatic cirrhosis. IMPRESSION: Rather minimal amount of ascites.  Hepatic cirrhosis. Electronically Signed   By: Bretta Bang III M.D.   On: 01/14/2016 10:31    Jeoffrey Massed, MD  [facility] Pager:[phone]  If 7PM-7AM, please contact night-coverage [email or web address] Password [email or web address] 01/15/2016, 11:29 AM   LOS: 2 days

## 2016-01-15 NOTE — Evaluation (Signed)
Physical Therapy Evaluation Patient Details Name: Maurice Paul MRN: [id number] DOB: 05/27/1946 Today's Date: 01/15/2016   History of Present Illness  Maurice Paul is a 70 y.o. male with Past medical history of peripheral vascular disease, essential hypertension, chronic stasis dermatitis, type 2 diabetes mellitus not on any medication, cirrhosis of the liver nonalcoholic, history of DVT. Admitted for principle problems of anasarca, ARF, Liver cirrhosis-cryptogenic, hyperkalemia, and thrombocytopenia  Clinical Impression  Pt admitted with above diagnosis. Pt currently with functional limitations due to the deficits listed below (see PT Problem List). Demonstrates minor instability with gait, lightly touching furniture in room and rail in hallway for support, declines RW. SpO2 94% on room air at rest, down to 86% while ambulating on room air. Improved with seated rest break and pursed lip breathing into mid 90s in less than 3 minutes. Pt will benefit from skilled PT to increase their independence and safety with mobility to allow discharge to the venue listed below.       Follow Up Recommendations Outpatient PT    Equipment Recommendations  None recommended by PT    Recommendations for Other Services       Precautions / Restrictions Precautions Precaution Comments: monitor O2 Restrictions Weight Bearing Restrictions: No      Mobility  Bed Mobility               General bed mobility comments: sitting in chair eating lunch  Transfers Overall transfer level: Needs assistance Equipment used: None Transfers: Sit to/from Stand Sit to Stand: Supervision         General transfer comment: supervision for safety. Mild sway noted but able to self correct. NO dizziness reported.  Ambulation/Gait Ambulation/Gait assistance: Supervision Ambulation Distance (Feet): 200 Feet Assistive device: None (declines RW) Gait Pattern/deviations: Step-through pattern;Decreased stride  length;Wide base of support Gait velocity: decreased Gait velocity interpretation: Below normal speed for age/gender General Gait Details: Some sway noted, intermittently touching furniture or rail in hallway for support. No overt loss of balance noted however provided close supervision for safety during bout. SpO2 down to 86% on room air with 3/4 dyspnea. Cues for energy conservation, pursed lip breathing and awareness of balance. Required one standing break.  Stairs            Wheelchair Mobility    Modified Rankin (Stroke Patients Only)       Balance Overall balance assessment: Needs assistance Sitting-balance support: No upper extremity supported;Feet supported Sitting balance-Leahy Scale: Normal     Standing balance support: No upper extremity supported Standing balance-Leahy Scale: Good                               Pertinent Vitals/Pain Pain Assessment: No/denies pain    Home Living Family/patient expects to be discharged to:: Private residence (Simultaneous filing. User may not have seen previous data.) Living Arrangements: Spouse/significant other (Simultaneous filing. User may not have seen previous data.) Available Help at Discharge: Family;Available 24 hours/day Type of Home: House Home Access: Stairs to enter Entrance Stairs-Rails: Left Entrance Stairs-Number of Steps: 3 Home Layout: Two level Home Equipment: Cane - single point;Shower seat;Grab bars - tub/shower      Prior Function Level of Independence: Independent         Comments: furniture walks. Goes into community without device     Hand Dominance   Dominant Hand: Right    Extremity/Trunk Assessment   Upper Extremity Assessment: Defer to  OT evaluation           Lower Extremity Assessment: RLE deficits/detail;LLE deficits/detail RLE Deficits / Details: anasarca LLE Deficits / Details: anasarca     Communication   Communication: No difficulties  Cognition  Arousal/Alertness: Awake/alert Behavior During Therapy: WFL for tasks assessed/performed Overall Cognitive Status: Within Functional Limits for tasks assessed                      General Comments General comments (skin integrity, edema, etc.): SpO2 at rest 94% on room air, ambulating 86% on room air. Back to mid 90s with seated rest and pursed lip breathing in <3 minutes.    Exercises        Assessment/Plan    PT Assessment Patient needs continued PT services  PT Diagnosis Difficulty walking;Abnormality of gait;Generalized weakness   PT Problem List Decreased strength;Decreased activity tolerance;Decreased balance;Decreased mobility;Decreased knowledge of use of DME;Cardiopulmonary status limiting activity;Obesity  PT Treatment Interventions DME instruction;Gait training;Stair training;Functional mobility training;Therapeutic activities;Therapeutic exercise;Balance training;Patient/family education   PT Goals (Current goals can be found in the Care Plan section) Acute Rehab PT Goals Patient Stated Goal: Get better PT Goal Formulation: With patient Time For Goal Achievement: 01/29/16 Potential to Achieve Goals: Good    Frequency Min 3X/week   Barriers to discharge        Co-evaluation               End of Session Equipment Utilized During Treatment: Gait belt Activity Tolerance: Patient tolerated treatment well Patient left: in chair;with call bell/phone within reach Nurse Communication: Mobility status         Time: [id number] PT Time Calculation (min) (ACUTE ONLY): 15 min   Charges:   PT Evaluation $PT Eval Low Complexity: 1 Procedure     PT G CodesBerton Mount:        Valita Righter S 01/15/2016, 3:00 PM  Sunday SpillersLogan Secor Lapw[place], [place]PT [phone]

## 2016-01-16 ENCOUNTER — Inpatient Hospital Stay (HOSPITAL_COMMUNITY): Payer: Medicare Other

## 2016-01-16 ENCOUNTER — Inpatient Hospital Stay (HOSPITAL_COMMUNITY): Payer: Medicare Other | Admitting: Certified Registered"

## 2016-01-16 ENCOUNTER — Encounter (HOSPITAL_COMMUNITY)
Admission: EM | Disposition: A | Discharge status: Hospice | Payer: Self-pay | Source: Home / Self Care | Attending: Emergency Medicine

## 2016-01-16 DIAGNOSIS — N185 Chronic kidney disease, stage 5: Secondary | ICD-10-CM

## 2016-01-16 HISTORY — PX: INSERTION OF DIALYSIS CATHETER: SHX1324

## 2016-01-16 LAB — GLUCOSE, CAPILLARY
GLUCOSE-CAPILLARY: 101 mg/dL — AB (ref 65–99)
GLUCOSE-CAPILLARY: 108 mg/dL — AB (ref 65–99)
GLUCOSE-CAPILLARY: 129 mg/dL — AB (ref 65–99)
GLUCOSE-CAPILLARY: 134 mg/dL — AB (ref 65–99)
Glucose-Capillary: 114 mg/dL — ABNORMAL HIGH (ref 65–99)

## 2016-01-16 LAB — CBC
HCT: 28.9 % — ABNORMAL LOW (ref 39.0–52.0)
Hemoglobin: 9.9 g/dL — ABNORMAL LOW (ref 13.0–17.0)
MCH: 36.4 pg — ABNORMAL HIGH (ref 26.0–34.0)
MCHC: 34.3 g/dL (ref 30.0–36.0)
MCV: 106.3 fL — ABNORMAL HIGH (ref 78.0–100.0)
PLATELETS: 105 10*3/uL — AB (ref 150–400)
RBC: 2.72 MIL/uL — AB (ref 4.22–5.81)
RDW: 15.4 % (ref 11.5–15.5)
WBC: 6.7 10*3/uL (ref 4.0–10.5)

## 2016-01-16 LAB — RENAL FUNCTION PANEL
ALBUMIN: 3.3 g/dL — AB (ref 3.5–5.0)
Anion gap: 12 (ref 5–15)
BUN: 37 mg/dL — ABNORMAL HIGH (ref 6–20)
CALCIUM: 9.3 mg/dL (ref 8.9–10.3)
CO2: 26 mmol/L (ref 22–32)
CREATININE: 4.36 mg/dL — AB (ref 0.61–1.24)
Chloride: 97 mmol/L — ABNORMAL LOW (ref 101–111)
GFR, EST AFRICAN AMERICAN: 14 mL/min — AB (ref >60)
GFR, EST NON AFRICAN AMERICAN: 12 mL/min — AB (ref >60)
Glucose, Bld: 111 mg/dL — ABNORMAL HIGH (ref 65–99)
PHOSPHORUS: 5.4 mg/dL — AB (ref 2.5–4.6)
Potassium: 5.2 mmol/L — ABNORMAL HIGH (ref 3.5–5.1)
SODIUM: 135 mmol/L (ref 135–145)

## 2016-01-16 LAB — SURGICAL PCR SCREEN
MRSA, PCR: NEGATIVE
Staphylococcus aureus: NEGATIVE

## 2016-01-16 LAB — HIV ANTIBODY (ROUTINE TESTING W REFLEX): HIV SCREEN 4TH GENERATION: NONREACTIVE

## 2016-01-16 SURGERY — INSERTION OF DIALYSIS CATHETER
Anesthesia: General

## 2016-01-16 MED ORDER — FENTANYL CITRATE (PF) 100 MCG/2ML IJ SOLN: 25.0000 ug | INTRAMUSCULAR | Status: DC | PRN

## 2016-01-16 MED ORDER — HEPARIN SODIUM (PORCINE) 1000 UNIT/ML IJ SOLN
INTRAMUSCULAR | Status: DC | PRN
  Administered 2016-01-16: 3.4 mL via INTRAVENOUS

## 2016-01-16 MED ORDER — SODIUM POLYSTYRENE SULFONATE 15 GM/60ML PO SUSP
45.0000 g | Freq: Once | ORAL | Status: AC
  Administered 2016-01-16: 45 g via ORAL

## 2016-01-16 MED ORDER — MIDAZOLAM HCL 2 MG/2ML IJ SOLN
INTRAMUSCULAR | Status: AC
  Filled 2016-01-16: qty 2

## 2016-01-16 MED ORDER — SODIUM CHLORIDE 0.9 % IV SOLN
INTRAVENOUS | Status: DC | PRN
  Administered 2016-01-16: 60 mL

## 2016-01-16 MED ORDER — PROPOFOL 10 MG/ML IV BOLUS
INTRAVENOUS | Status: DC | PRN
  Administered 2016-01-16: 25 mg via INTRAVENOUS

## 2016-01-16 MED ORDER — CEFAZOLIN SODIUM-DEXTROSE 2-4 GM/100ML-% IV SOLN
2.0000 g | INTRAVENOUS | Status: DC
  Filled 2016-01-16: qty 100

## 2016-01-16 MED ORDER — ONDANSETRON HCL 4 MG/2ML IJ SOLN
INTRAMUSCULAR | Status: DC | PRN
  Administered 2016-01-16: 4 mg via INTRAVENOUS

## 2016-01-16 MED ORDER — LIDOCAINE-EPINEPHRINE (PF) 1 %-1:200000 IJ SOLN
INTRAMUSCULAR | Status: DC | PRN
  Administered 2016-01-16: 10 mL

## 2016-01-16 MED ORDER — FENTANYL CITRATE (PF) 250 MCG/5ML IJ SOLN
INTRAMUSCULAR | Status: AC
  Filled 2016-01-16: qty 5

## 2016-01-16 MED ORDER — CEFAZOLIN SODIUM 1 G IJ SOLR
INTRAMUSCULAR | Status: AC
  Filled 2016-01-16: qty 30

## 2016-01-16 MED ORDER — OXYCODONE-ACETAMINOPHEN 5-325 MG PO TABS
1.0000 | ORAL_TABLET | ORAL | Status: DC | PRN
  Administered 2016-01-17: 2 via ORAL

## 2016-01-16 MED ORDER — CEFAZOLIN SODIUM 1-5 GM-% IV SOLN
1.0000 g | INTRAVENOUS | Status: DC
  Filled 2016-01-16: qty 50

## 2016-01-16 MED ORDER — MIDAZOLAM HCL 2 MG/2ML IJ SOLN
INTRAMUSCULAR | Status: DC | PRN
  Administered 2016-01-16: 2 mg via INTRAVENOUS

## 2016-01-16 MED ORDER — PROPOFOL 10 MG/ML IV BOLUS
INTRAVENOUS | Status: AC
  Filled 2016-01-16: qty 20

## 2016-01-16 MED ORDER — CEFAZOLIN SODIUM 1 G IJ SOLR
INTRAMUSCULAR | Status: DC | PRN
  Administered 2016-01-16: 3 g via INTRAMUSCULAR

## 2016-01-16 MED ORDER — FENTANYL CITRATE (PF) 250 MCG/5ML IJ SOLN
INTRAMUSCULAR | Status: DC | PRN
  Administered 2016-01-16: 50 ug via INTRAVENOUS

## 2016-01-16 MED ORDER — SODIUM CHLORIDE 0.9 % IV SOLN
INTRAVENOUS | Status: DC | PRN
  Administered 2016-01-16: 15:00:00 via INTRAVENOUS

## 2016-01-16 MED ORDER — ONDANSETRON HCL 4 MG/2ML IJ SOLN
INTRAMUSCULAR | Status: AC
  Filled 2016-01-16: qty 2

## 2016-01-16 SURGICAL SUPPLY — 39 items
BAG DECANTER FOR FLEXI CONT (MISCELLANEOUS) ×3 IMPLANT
BIOPATCH RED 1 DISK 7.0 (GAUZE/BANDAGES/DRESSINGS) ×2 IMPLANT
BIOPATCH RED 1IN DISK 7.0MM (GAUZE/BANDAGES/DRESSINGS) ×1
CATH PALINDROME RT-P 15FX19CM (CATHETERS) IMPLANT
CATH PALINDROME RT-P 15FX23CM (CATHETERS) ×3 IMPLANT
CATH PALINDROME RT-P 15FX28CM (CATHETERS) ×3 IMPLANT
CATH PALINDROME RT-P 15FX55CM (CATHETERS) IMPLANT
CHLORAPREP W/TINT 26ML (MISCELLANEOUS) ×3 IMPLANT
COVER PROBE W GEL 5X96 (DRAPES) ×3 IMPLANT
DERMABOND ADVANCED (GAUZE/BANDAGES/DRESSINGS) ×2
DERMABOND ADVANCED .7 DNX12 (GAUZE/BANDAGES/DRESSINGS) ×1 IMPLANT
DEVICE TORQUE H2O (MISCELLANEOUS) ×3 IMPLANT
DRAPE C-ARM 42X72 X-RAY (DRAPES) ×3 IMPLANT
DRAPE CHEST BREAST 15X10 FENES (DRAPES) ×3 IMPLANT
GAUZE SPONGE 2X2 8PLY STRL LF (GAUZE/BANDAGES/DRESSINGS) ×1 IMPLANT
GAUZE SPONGE 4X4 16PLY XRAY LF (GAUZE/BANDAGES/DRESSINGS) ×3 IMPLANT
GLOVE BIO SURGEON STRL SZ7.5 (GLOVE) ×3 IMPLANT
GLOVE BIOGEL PI IND STRL 8 (GLOVE) ×1 IMPLANT
GLOVE BIOGEL PI INDICATOR 8 (GLOVE) ×2
GOWN STRL REUS W/ TWL LRG LVL3 (GOWN DISPOSABLE) ×2 IMPLANT
GOWN STRL REUS W/TWL LRG LVL3 (GOWN DISPOSABLE) ×4
GUIDEWIRE ANGLED .035X150CM (WIRE) ×3 IMPLANT
KIT BASIN OR (CUSTOM PROCEDURE TRAY) ×3 IMPLANT
KIT ROOM TURNOVER OR (KITS) ×3 IMPLANT
NEEDLE 18GX1X1/2 (RX/OR ONLY) (NEEDLE) ×3 IMPLANT
NEEDLE 22X1 1/2 (OR ONLY) (NEEDLE) ×3 IMPLANT
NEEDLE HYPO 25GX1X1/2 BEV (NEEDLE) ×3 IMPLANT
NS IRRIG 1000ML POUR BTL (IV SOLUTION) IMPLANT
PACK SURGICAL SETUP 50X90 (CUSTOM PROCEDURE TRAY) ×3 IMPLANT
PAD ARMBOARD 7.5X6 YLW CONV (MISCELLANEOUS) ×6 IMPLANT
SPONGE GAUZE 2X2 STER 10/PKG (GAUZE/BANDAGES/DRESSINGS) ×2
SUT ETHILON 3 0 PS 1 (SUTURE) ×3 IMPLANT
SUT VICRYL 4-0 PS2 18IN ABS (SUTURE) ×3 IMPLANT
SYR 20CC LL (SYRINGE) ×6 IMPLANT
SYR 5ML LL (SYRINGE) ×6 IMPLANT
SYR CONTROL 10ML LL (SYRINGE) ×3 IMPLANT
SYRINGE 10CC LL (SYRINGE) ×3 IMPLANT
TAPE CLOTH SURG 4X10 WHT LF (GAUZE/BANDAGES/DRESSINGS) ×3 IMPLANT
WATER STERILE IRR 1000ML POUR (IV SOLUTION) IMPLANT

## 2016-01-16 NOTE — Anesthesia Preprocedure Evaluation (Addendum)
Anesthesia Evaluation  Patient identified by MRN, date of birth, ID band Patient awake    Reviewed: Allergy & Precautions, NPO status , Patient's Chart, lab work & pertinent test results  History of Anesthesia Complications Negative for: history of anesthetic complications  Airway Mallampati: II  TM Distance: >3 FB Neck ROM: Full    Dental  (+) Edentulous Upper, Edentulous Lower   Pulmonary neg shortness of breath, neg sleep apnea, neg COPD, neg recent URI, former smoker,    breath sounds clear to auscultation       Cardiovascular hypertension, Pt. on medications + Peripheral Vascular Disease and +CHF   Rhythm:Regular     Neuro/Psych Previous subdural hematoma negative psych ROS   GI/Hepatic negative GI ROS, (+) Cirrhosis       ,   Endo/Other  diabetes, Type obesity  Renal/GU ARFRenal disease     Musculoskeletal   Abdominal   Peds  Hematology   Anesthesia Other Findings   Reproductive/Obstetrics                            Anesthesia Physical Anesthesia Plan  ASA: IV  Anesthesia Plan: MAC   Post-op Pain Management:    Induction: Intravenous  Airway Management Planned: Nasal Cannula, Natural Airway and Simple Face Mask  Additional Equipment: None  Intra-op Plan:   Post-operative Plan:   Informed Consent: I have reviewed the patients History and Physical, chart, labs and discussed the procedure including the risks, benefits and alternatives for the proposed anesthesia with the patient or authorized representative who has indicated his/her understanding and acceptance.   Dental advisory given  Plan Discussed with: CRNA and Surgeon  Anesthesia Plan Comments:         Anesthesia Quick Evaluation

## 2016-01-16 NOTE — Interval H&P Note (Signed)
History and Physical Interval Note:  01/16/2016 2:38 PM  Maurice BaizeWalter S Lehrman  has presented today for surgery, with the diagnosis of ESRD  The various methods of treatment have been discussed with the patient and family. After consideration of risks, benefits and other options for treatment, the patient has consented to  Procedure(s): INSERTION OF DIALYSIS CATHETER (N/A) as a surgical intervention .  The patient's history has been reviewed, patient examined, no change in status, stable for surgery.  I have reviewed the patient's chart and labs.  Questions were answered to the patient's satisfaction.     Waverly Ferrariickson, Christopher

## 2016-01-16 NOTE — Op Note (Signed)
    NAME: Maurice Paul   MRN: [id number] DOB: 09/07/1946    DATE OF OPERATION: 01/16/2016  PREOP DIAGNOSIS: renal failure  POSTOP DIAGNOSIS: same  PROCEDURE: attempted placement of right IJ catheter, placement of left IJ 28 cm tunnel dialysis catheter  SURGEON: Di Kindlehristopher S. Edilia Boickson, MD, FACS  ASSIST: none  ANESTHESIA: local with sedation   EBL: minimal  INDICATIONS: Maurice BaizeWalter S Jarrells is a 70 y.o. male he was to begin dialysis and I was asked to place a catheter.  FINDINGS: the right IJ appeared to be patent but I was unable to thread the wire. For this reason I placed the catheter on the left side.  TECHNIQUE: The patient was taken to the operating room and sedated by anesthesia. The neck and upper chest were prepped and draped in usual sterile fashion. Under ultrasound guidance, after the skin was anesthetized, the right IJ was cannulated. The wire would not thread. I attempted an angled Glidewire but this too appeared to be curling and the IJ may be occluded. I therefore selected a left-sided approach.  Under ultrasound guidance, after the skin was anesthetized, the left IJ was cannulated and a guidewire introduced into the superior vena cava under fluoroscopic control. The tract over the wire was dilated. The dilator and peel-away sheath were transected over the wire and the dilator removed. The 28 cm catheter was threaded over the wire down into the right atrium and the peel-away sheath and wire were removed. The exit site for the catheter was selected and the skin anesthetized between the 2 areas. The cath was brought to the tunnel cut to appropriate length and the distal ports were attached. Both ports withdrew easily with an flushed with a saline filled with concentrated heparin. The catheter was secured at its exit site with 3-0 nylon suture. The IJ cannulation site was closed with a 4-0 subcuticular stitch. Sterile dressing was applied. Patient tolerated the procedure well and was  transferred to the recovery room in stable condition. All needle and sponge counts were correct.  Waverly Ferrarihristopher Dickson, MD, FACS [facility]  DATE OF DICTATION:   01/16/2016

## 2016-01-16 NOTE — Progress Notes (Signed)
S: No new CO  O:BP 110/54 mmHg  Pulse 70  Temp(Src) 98.2 F (36.8 C) (Oral)  Resp 18  Ht  (1.778 m)  Wt 151.864 kg (334 lb 12.8 oz)  BMI 48.04 kg/m2  SpO2 91%  Intake/Output Summary (Last 24 hours) at 01/16/16 0758 Last data filed at 01/16/16 0538  Gross per 24 hour  Intake    360 ml  Output    101 ml  Net    259 ml   Weight change: 0.408 kg (14.4 oz) ZOX:WRUEA and alert CVS:RRR Resp:Decreased BS bases Abd:+ BS NTND + subQ edema Ext: 4+ edema NEURO: CNI, OX3, no asterixis Foley with blood tinged UO   . aspirin EC  325 mg Oral Daily  . carvedilol  3.125 mg Oral BID WC  . enoxaparin (LOVENOX) injection  40 mg Subcutaneous Q24H  . furosemide  80 mg Intravenous Q12H  . insulin aspart  0-5 Units Subcutaneous QHS  . insulin aspart  0-9 Units Subcutaneous TID WC  . lactulose  10 g Oral BID  . pantoprazole  40 mg Oral Daily  . sodium chloride flush  3 mL Intravenous Q12H  . sodium polystyrene  45 g Oral Once   US Renal  01/14/2016  CLINICAL DATA:  Chronic renal disease EXAM: RENAL / URINARY TRACT ULTRASOUND COMPLETE COMPARISON:  CT abdomen and pelvis December 01, 2014 FINDINGS: Right Kidney: Length: 13.9 cm. Echogenicity and renal cortical thickness are within normal limits. No perinephric fluid or hydronephrosis visualized. There is a cyst arising from the lower pole of the right kidney measuring 1.8 x 1.9 x 1.9 cm. No sonographically demonstrable calculus or ureterectasis. Left Kidney: Length: 12.8 cm. Echogenicity and renal cortical thickness are within normal limits. No mass, perinephric fluid, or hydronephrosis visualized. No sonographically demonstrable calculus or ureterectasis. Bladder: Appears normal for degree of bladder distention. Incidental note is made of a small splenule adjacent to the spleen. A small amount of ascites is noted near the liver edge. IMPRESSION: Small right renal cyst.  Kidneys otherwise appear unremarkable. Small amount of ascites near the liver  edge. Small accessory spleen noted incidentally. Electronically Signed   By: Bretta Bang III M.D.   On: 01/14/2016 10:37   US Abdomen Limited  01/14/2016  CLINICAL DATA:  Hepatic cirrhosis with abdominal distention EXAM: LIMITED ABDOMEN ULTRASOUND FOR ASCITES TECHNIQUE: Limited ultrasound survey for ascites was performed in all four abdominal quadrants. COMPARISON:  CT abdomen and pelvis December 01, 2014 FINDINGS: There is rather minimal ascites adjacent to the liver. Peristalsing bowel is seen elsewhere. The contour of the liver is nodular with increased liver echogenicity consistent with underlying hepatic cirrhosis. IMPRESSION: Rather minimal amount of ascites.  Hepatic cirrhosis. Electronically Signed   By: Bretta Bang III M.D.   On: 01/14/2016 10:31   BMET    Component Value Date/Time   NA 135 01/16/2016 0311   K 5.2* 01/16/2016 0311   CL 97* 01/16/2016 0311   CO2 26 01/16/2016 0311   GLUCOSE 111* 01/16/2016 0311   BUN 37* 01/16/2016 0311   CREATININE 4.36* 01/16/2016 0311   CALCIUM 9.3 01/16/2016 0311   GFRNONAA 12* 01/16/2016 0311   GFRAA 14* 01/16/2016 0311   CBC    Component Value Date/Time   WBC 6.7 01/16/2016 0311   RBC 2.72* 01/16/2016 0311   RBC 2.84* 01/15/2016 0345   HGB 9.9* 01/16/2016 0311   HCT 28.9* 01/16/2016 0311   PLT 105* 01/16/2016 0311   MCV 106.3* 01/16/2016 5409  MCH 36.4* 01/16/2016 0311   MCHC 34.3 01/16/2016 0311   RDW 15.4 01/16/2016 0311   LYMPHSABS 0.9 01/14/2016 0633   MONOABS 0.6 01/14/2016 0633   EOSABS 0.2 01/14/2016 0633   BASOSABS 0.0 01/14/2016 0633     Assessment: 1. Subacute renal insuff probably related to hemodynamic changes due to liver ds and ACE +/- Rt heart failure.   2. Volume overload 3. Cirrhosis 4. DM 5. Mild hyperkalemia  Plan: 1. Will make arrangements for perm cath.  Spoke to [facility] and Dr Edilia Boickson will put in today so will plan HD tomorrow.   Hematology reviewed things and did not think TTP/HUS.  His UA  from yest is probably not accurate due to so little UO and UA from 01/14/16 shows no proteinuria 2.  Recheck labs in AM   Doshia Dalia T

## 2016-01-16 NOTE — Progress Notes (Signed)
Subjective:  No complaints today of chest pain or shortness of breath.  Renal function continues to worsen.  Plans are now being made for dialysis.  Urine output has been somewhat poor.  The etiology of all this is unclear.  His echocardiogram shows preserved LV systolic function.  His pulmonary pressures appeared up and there was a question of septal flattening.  His IVC was also dilated Roda ShuttersXu does have some right heart failure.    Objective:  Vital Signs in the last 24 hours: BP 110/54 mmHg  Pulse 70  Temp(Src) 98.2 F (36.8 C) (Oral)  Resp 18  Ht 5\' 10"  (1.778 m)  Wt 151.864 kg (334 lb 12.8 oz)  BMI 48.04 kg/m2  SpO2 91%  Physical Exam: Morbidly obese white male currently in no acute distress Lungs:  Clear  Cardiac:  Regular rhythm, normal S1 and S2, no S3, no murmur Abdomen: Quite distended Extremities:  4+ edema noted with brawny erythema extending all the way up the lower extremities  Intake/Output from previous day: 04/08 0701 - 04/09 0700 In: 360 [P.O.:360] Out: 101 [Urine:100; Stool:1] Weight Filed Weights   01/14/16 0440 01/15/16 0542 01/16/16 0538  Weight: 152.5 kg (336 lb 3.2 oz) 151.456 kg (333 lb 14.4 oz) 151.864 kg (334 lb 12.8 oz)    Lab Results: Basic Metabolic Panel:  Recent Labs  40/98/1103/05/26 0345 01/16/16 0311  NA 137 135  K 5.4* 5.2*  CL 100* 97*  CO2 26 26  GLUCOSE 110* 111*  BUN 31* 37*  CREATININE 3.14* 4.36*    CBC:  Recent Labs  01/14/16 0633 01/15/16 0345 01/16/16 0311  WBC 6.9 6.8 6.7  NEUTROABS 5.2  --   --   HGB 9.6* 9.7* 9.9*  HCT 30.3* 30.0* 28.9*  MCV 105.2* 105.6* 106.3*  PLT 104* 96* 105*   PROTIME: Lab Results  Component Value Date   INR 1.46 01/14/2016   INR 1.10 12/24/2013    Telemetry: Normal sinus rhythm  Assessment/Plan:  1.  Marked lower extremity edema that is likely multifactorial.  At this point unclear whether it is due to right heart failure, cirrhosis, or significant chronic venous stasis or  lymphedema. 2.  Prior diagnosis of cirrhosis questionable etiology but evidently has refused liver biopsy 3. Subacute renal failure progressive  4.  Right heart failure unclear etiology.  Unclear whether this is due to morbid obesity, subacute pulmonary emboli, or sleep apnea.  Getting at this diagnosis will be difficult.   5.  Thrombocytopenia, macrocytosis, mildly low albumin and evidence of cirrhosis of which could be markers of cirrhosis 7.  History of DVT previously at the time of subarachnoid hemorrhage 8.  History of traumatic subdural hemorrhage  Recommendations:  Echocardiogram reviewed and discussed with Dr. Briant CedarMattingly.  I agree with the need for dialysis.  Difficult to manage volume status in the presence of acute renal failure.  We'll continue to follow with you.  It may be worthwhile empirically anticoagulating.  Darden PalmerW. Spencer Tilley, Jr.  MD [facility] Cardiology  01/16/2016, 10:11 AM

## 2016-01-16 NOTE — Consult Note (Signed)
[facility]  Patient name: Maurice Paul MRN: [id number] DOB: September 13, 1946 Sex: male  REASON FOR CONSULT: Needs hemodialysis catheter to begin dialysis. Consult is from Dr. Fidela Salisbury.  HPI: Maurice Paul is a 70 y.o. male, who was admitted on 01/13/2016 with weight gain. He was seen in consultation by nephrology with subacute renal failure. Was unaware of any previous history of renal insufficiency. It was felt that this possibly was hemodynamically mediated due to liver disease and his medications. We have been asked to place a catheter so that he can begin dialysis.    Past Medical History  Diagnosis Date  . PVD (peripheral vascular disease) (HCC)   . Tobacco chew use     for 30 years  . Hypertension   . SDH (subdural hematoma) (HCC) 03/2014    fall  . DVT (deep venous thrombosis) (HCC)     not on anticoagulation due to hematuria  . Stasis dermatitis   . Cirrhosis of liver (HCC)   . Diabetes mellitus, type 2 (HCC)     Family History  Problem Relation Age of Onset  . Stroke Father     SOCIAL HISTORY: Social History   Social History  . Marital Status: Married    Spouse Name: Graciella Belton  . Number of Children: 3  . Years of Education: 12+   Occupational History  . Retired     Social History Main Topics  . Smoking status: Former Smoker    Quit date: 10/09/1998  . Smokeless tobacco: Former Neurosurgeon    Types: Chew  . Alcohol Use: 0.0 oz/week    0 Standard drinks or equivalent per week     Comment: Occasional  . Drug Use: No  . Sexual Activity: Not on file   Other Topics Concern  . Not on file   Social History Narrative   Lives at home with wife.    Right handed.    Caffeine use:  Drinks 2L coke zero per day.        Allergies  Allergen Reactions  . Sulfa Antibiotics     Hives  . Onglyza [Saxagliptin] Rash    Current Facility-Administered Medications  Medication Dose Route Frequency Provider Last Rate Last Dose  . 0.9 %   sodium chloride infusion  250 mL Intravenous PRN Rolly Salter, MD      . acetaminophen (TYLENOL) tablet 650 mg  650 mg Oral Q4H PRN Rolly Salter, MD      . aspirin EC tablet 325 mg  325 mg Oral Daily Rolly Salter, MD   325 mg at 01/15/16 0941  . carvedilol (COREG) tablet 3.125 mg  3.125 mg Oral BID WC Rolly Salter, MD   3.125 mg at 01/15/16 1700  . enoxaparin (LOVENOX) injection 40 mg  40 mg Subcutaneous Q24H Maretta Bees, MD   40 mg at 01/15/16 1200  . furosemide (LASIX) injection 80 mg  80 mg Intravenous Q12H Maretta Bees, MD   80 mg at 01/16/16 0649  . insulin aspart (novoLOG) injection 0-5 Units  0-5 Units Subcutaneous QHS Rolly Salter, MD   0 Units at 01/13/16 2200  . insulin aspart (novoLOG) injection 0-9 Units  0-9 Units Subcutaneous TID WC Rolly Salter, MD   0 Units at 01/14/16 [phone]  . lactulose (CHRONULAC) 10 GM/15ML solution 10 g  10 g Oral BID Rolly Salter, MD   10 g at 01/15/16 2253  . ondansetron (ZOFRAN)  injection 4 mg  4 mg Intravenous Q6H PRN Rolly Salter, MD      . pantoprazole (PROTONIX) EC tablet 40 mg  40 mg Oral Daily Rolly Salter, MD   40 mg at 01/15/16 0941  . sodium chloride flush (NS) 0.9 % injection 3 mL  3 mL Intravenous Q12H Rolly Salter, MD   3 mL at 01/15/16 2257  . sodium chloride flush (NS) 0.9 % injection 3 mL  3 mL Intravenous PRN Rolly Salter, MD      . sodium polystyrene (KAYEXALATE) 15 GM/60ML suspension 45 g  45 g Oral Once Shanker Levora Dredge, MD        REVIEW OF SYSTEMS:   denotes positive finding,  denotes negative finding Cardiac  Comments:  Chest pain or chest pressure:    Shortness of breath upon exertion: XX   Short of breath when lying flat:    Irregular heart rhythm:        Vascular    Pain in calf, thigh, or hip brought on by ambulation:    Pain in feet at night that wakes you up from your sleep:     Blood clot in your veins: X   Leg swelling:  X       Pulmonary    Oxygen at home:    Productive cough:      Wheezing:         Neurologic    Sudden weakness in arms or legs:     Sudden numbness in arms or legs:     Sudden onset of difficulty speaking or slurred speech:    Temporary loss of vision in one eye:     Problems with dizziness:         Gastrointestinal    Blood in stool:     Vomited blood:         Genitourinary    Burning when urinating:     Blood in urine: X       Psychiatric    Major depression:         Hematologic    Bleeding problems:    Problems with blood clotting too easily:        Skin    Rashes or ulcers: X Stasis dermatitis      Constitutional    Fever or chills:      PHYSICAL EXAM: Filed Vitals:   01/15/16 0542 01/15/16 1230 01/15/16 2027 01/16/16 0538  BP: 119/55 116/56 103/57 110/54  Pulse: 79 74 67 70  Temp: 98.5 F (36.9 C) 97.5 F (36.4 C) 98.3 F (36.8 C) 98.2 F (36.8 C)  TempSrc: Oral Oral Oral Oral  Resp: Height:      Weight: 333 lb 14.4 oz (151.456 kg)   334 lb 12.8 oz (151.864 kg)  SpO2: 93% 92% 91% 91%    GENERAL: The patient is a well-nourished male, in no acute distress. The vital signs are documented above. CARDIAC: There is a regular rate and rhythm.  VASCULAR: he has significant bilateral lower extreme swelling with hyperpigmentation bilaterally consistent with chronic venous insufficiency. PULMONARY: There is good air exchange bilaterally without wheezing or rales. ABDOMEN: Soft and non-tender with normal pitched bowel sounds.  MUSCULOSKELETAL: There are no major deformities or cyanosis. NEUROLOGIC: No focal weakness or paresthesias are detected. SKIN: There are no ulcers or rashes noted. PSYCHIATRIC: The patient has a normal affect.  DATA:  Dressings 4.36. GFR is 12. Hemoglobin is  9.9. Platelet count is 105,000. INR is 1.46.  MEDICAL ISSUES:  SUBACUTE RENAL FAILURE: We have been asked to place a tunneled dialysis catheter today.  I have discussed the indications for placement of a tunneled dialysis  catheter. I have also discussed the potential complications, including but not limited to arterial or venous injury, infection, or pneumothorax. All of the patients questions were answered and they are agreeable to proceed.    Maurice Paul, Kapil Petropoulos [facility] Beeper: [phone]

## 2016-01-16 NOTE — H&P (View-Only) (Signed)
 [facility] [place]  Patient name: Maurice Paul MRN: [id number] DOB: 12/01/1945 Sex: male  REASON FOR CONSULT: Needs hemodialysis catheter to begin dialysis. Consult is from Dr. Mike Mattingly.  HPI: Maurice Paul is a 69 y.o. male, who was admitted on 01/13/2016 with weight gain. He was seen in consultation by nephrology with subacute renal failure. Was unaware of any previous history of renal insufficiency. It was felt that this possibly was hemodynamically mediated due to liver disease and his medications. We have been asked to place a catheter so that he can begin dialysis.    Past Medical History  Diagnosis Date  . PVD (peripheral vascular disease) (HCC)   . Tobacco chew use     for 30 years  . Hypertension   . SDH (subdural hematoma) (HCC) 03/2014    fall  . DVT (deep venous thrombosis) (HCC)     not on anticoagulation due to hematuria  . Stasis dermatitis   . Cirrhosis of liver (HCC)   . Diabetes mellitus, type 2 (HCC)     Family History  Problem Relation Age of Onset  . Stroke Father     SOCIAL HISTORY: Social History   Social History  . Marital Status: Married    Spouse Name: Dianne  . Number of Children: 3  . Years of Education: 12+   Occupational History  . Retired     Social History Main Topics  . Smoking status: Former Smoker    Quit date: 10/09/1998  . Smokeless tobacco: Former User    Types: Chew  . Alcohol Use: 0.0 oz/week    0 Standard drinks or equivalent per week     Comment: Occasional  . Drug Use: No  . Sexual Activity: Not on file   Other Topics Concern  . Not on file   Social History Narrative   Lives at home with wife.    Right handed.    Caffeine use:  Drinks 2L coke zero per day.        Allergies  Allergen Reactions  . Sulfa Antibiotics     Hives  . Onglyza [Saxagliptin] Rash    Current Facility-Administered Medications  Medication Dose Route Frequency Provider Last Rate Last Dose  . 0.9 %   sodium chloride infusion  250 mL Intravenous PRN Pranav M Patel, MD      . acetaminophen (TYLENOL) tablet 650 mg  650 mg Oral Q4H PRN Pranav M Patel, MD      . aspirin EC tablet 325 mg  325 mg Oral Daily Pranav M Patel, MD   325 mg at 01/15/16 0941  . carvedilol (COREG) tablet 3.125 mg  3.125 mg Oral BID WC Pranav M Patel, MD   3.125 mg at 01/15/16 1700  . enoxaparin (LOVENOX) injection 40 mg  40 mg Subcutaneous Q24H Shanker M Ghimire, MD   40 mg at 01/15/16 1200  . furosemide (LASIX) injection 80 mg  80 mg Intravenous Q12H Shanker M Ghimire, MD   80 mg at 01/16/16 0649  . insulin aspart (novoLOG) injection 0-5 Units  0-5 Units Subcutaneous QHS Pranav M Patel, MD   0 Units at 01/13/16 2200  . insulin aspart (novoLOG) injection 0-9 Units  0-9 Units Subcutaneous TID WC Pranav M Patel, MD   0 Units at 01/14/16 0828  . lactulose (CHRONULAC) 10 GM/15ML solution 10 g  10 g Oral BID Pranav M Patel, MD   10 g at 01/15/16 2253  . ondansetron (ZOFRAN)   injection 4 mg  4 mg Intravenous Q6H PRN Pranav M Patel, MD      . pantoprazole (PROTONIX) EC tablet 40 mg  40 mg Oral Daily Pranav M Patel, MD   40 mg at 01/15/16 0941  . sodium chloride flush (NS) 0.9 % injection 3 mL  3 mL Intravenous Q12H Pranav M Patel, MD   3 mL at 01/15/16 2257  . sodium chloride flush (NS) 0.9 % injection 3 mL  3 mL Intravenous PRN Pranav M Patel, MD      . sodium polystyrene (KAYEXALATE) 15 GM/60ML suspension 45 g  45 g Oral Once Shanker M Ghimire, MD        REVIEW OF SYSTEMS:  [X] denotes positive finding, [ ] denotes negative finding Cardiac  Comments:  Chest pain or chest pressure:    Shortness of breath upon exertion: XX   Short of breath when lying flat:    Irregular heart rhythm:        Vascular    Pain in calf, thigh, or hip brought on by ambulation:    Pain in feet at night that wakes you up from your sleep:     Blood clot in your veins: X   Leg swelling:  X       Pulmonary    Oxygen at home:    Productive cough:      Wheezing:         Neurologic    Sudden weakness in arms or legs:     Sudden numbness in arms or legs:     Sudden onset of difficulty speaking or slurred speech:    Temporary loss of vision in one eye:     Problems with dizziness:         Gastrointestinal    Blood in stool:     Vomited blood:         Genitourinary    Burning when urinating:     Blood in urine: X       Psychiatric    Major depression:         Hematologic    Bleeding problems:    Problems with blood clotting too easily:        Skin    Rashes or ulcers: X Stasis dermatitis      Constitutional    Fever or chills:      PHYSICAL EXAM: Filed Vitals:   01/15/16 0542 01/15/16 1230 01/15/16 2027 01/16/16 0538  BP: 119/55 116/56 103/57 110/54  Pulse: 79 74 67 70  Temp: 98.5 F (36.9 C) 97.5 F (36.4 C) 98.3 F (36.8 C) 98.2 F (36.8 C)  TempSrc: Oral Oral Oral Oral  Resp: 18 18 18 18  Height:      Weight: 333 lb 14.4 oz (151.456 kg)   334 lb 12.8 oz (151.864 kg)  SpO2: 93% 92% 91% 91%    GENERAL: The patient is a well-nourished male, in no acute distress. The vital signs are documented above. CARDIAC: There is a regular rate and rhythm.  VASCULAR: he has significant bilateral lower extreme swelling with hyperpigmentation bilaterally consistent with chronic venous insufficiency. PULMONARY: There is good air exchange bilaterally without wheezing or rales. ABDOMEN: Soft and non-tender with normal pitched bowel sounds.  MUSCULOSKELETAL: There are no major deformities or cyanosis. NEUROLOGIC: No focal weakness or paresthesias are detected. SKIN: There are no ulcers or rashes noted. PSYCHIATRIC: The patient has a normal affect.  DATA:  Dressings 4.36. GFR is 12. Hemoglobin is   9.9. Platelet count is 105,000. INR is 1.46.  MEDICAL ISSUES:  SUBACUTE RENAL FAILURE: We have been asked to place a tunneled dialysis catheter today.  I have discussed the indications for placement of a tunneled dialysis  catheter. I have also discussed the potential complications, including but not limited to arterial or venous injury, infection, or pneumothorax. All of the patients questions were answered and they are agreeable to proceed.    Maurice Paul [facility] [place] Beeper: [phone]     

## 2016-01-16 NOTE — Transfer of Care (Signed)
Immediate Anesthesia Transfer of Care Note  Patient: Maurice Paul  Procedure(s) Performed: Procedure(s): INSERTION OF DIALYSIS CATHETER ULTRASOUND GUIDED (N/A)  Patient Location: PACU  Anesthesia Type MAC  Level of Consciousness: awake  Airway & Oxygen Therapy: Patient Spontanous Breathing and Patient connected to nasal cannula oxygen  Post-op Assessment: Report given to RN and Post -op Vital signs reviewed and stable  Post vital signs: Reviewed and stable  Last Vitals:  Filed Vitals:   01/16/16 0538 01/16/16 1223  BP: 110/54 101/44  Pulse: 70 86  Temp: 36.8 C 36.4 C  Resp: 18 18    Complications: No apparent anesthesia complications

## 2016-01-16 NOTE — Anesthesia Procedure Notes (Signed)
Procedure Name: MAC Date/Time: 01/16/2016 2:53 PM Performed by: Alanda AmassFRIEDMAN, Cinque Begley A Pre-anesthesia Checklist: Patient identified, Timeout performed, Emergency Drugs available, Suction available and Patient being monitored Patient Re-evaluated:Patient Re-evaluated prior to inductionOxygen Delivery Method: Simple face mask

## 2016-01-16 NOTE — Progress Notes (Signed)
PATIENT DETAILS Name: Maurice Paul Age: 70 y.o. Sex: male Date of Birth: [birth year] Admit Date: 01/13/2016 Admitting Physician Rolly Salter, MD RUE:[id number] PATRICK, MD  Subjective: Still with significant leg swelling.  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 Marland Kitchen Unfortunately no response to high dosing of IV diuretics, now oliguric, nephrology plans on starting dialysis once hemodialysis catheter is inserted. 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  Active Problems: NWG:NFAOZ review of labs from [facility]-it appears that ARF started around 2 months back.Renal following, suspicion for hemodynamic mediated renal failure. Renal ultrasound negative for hydronephrosis.Unfortunately, now oliguric, nephrology planning on hemodialysis.  Liver cirrhosis-cryptogenic: Apparently has had extensive workup with [facility]-per patient workup has been negative. He has refused a liver biopsy in the past. No significant ascites seen on abdominal ultrasound.  Hyperkalemia: Kayexalate X 1-follow lytes tomorrow  Mild hyponatremia: Resolved-Secondary to hypervolemia, should improve with diuresis.  Minimally elevated troponins: Trend is flat, not consistent with ACS. Likely demand ischemia or false positive elevation in a setting of CKD. Await echo  Thrombocytopenia: Likely secondary to liver cirrhosis. Spoke with Dr Myrle Sheng on 4/8 who reviewed labs, chart over the phone-did not think patient had a TTP, he thinks that this is from liver cirrhoses as well.  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  Spoke with Dr Myrle Sheng on 4/8 who reviewed labs, chart over the phone-did not think patient had a TTP, he thinks that this is from liver cirrhoses as well.  Chronic venous stasis dermatitis:  See no evidence of cellulitis, discontinue Keflex. Lower extremity Dopplers negative for DVT  Hypertension: Continue Coreg, will continue to hold lisinopril, amlodipine and Aldactone. Follow.  Type 2 diabetes: Hold all oral hypoglycemic agents, CBG stable with SSI.  History of traumatic subarachnoid hemorrhage/subdural hemorrhage March 2015  History of left peroneal DVT 2015-provoked-during prolonged hospitalization due to traumatic SAH/SDH  Disposition: Remain inpatient  Antimicrobial agents  See below  Anti-infectives    Start     Dose/Rate Route Frequency Ordered Stop   01/13/16 2200  cephALEXin (KEFLEX) capsule 500 mg  Status:  Discontinued     500 mg Oral 2 times daily 01/13/16 2148 01/14/16 1100      DVT Prophylaxis: Prophylactic Lovenox-watch platelets  Code Status: Full code  Family Communication None at bedside  Procedures: None  CONSULTS:  cardiology and nephrology  Time spent 30 minutes-Greater than 50% of this time was spent in counseling, explanation of diagnosis, planning of further management, and coordination of care.  MEDICATIONS: Scheduled Meds: . aspirin EC  325 mg Oral Daily  . carvedilol  3.125 mg Oral BID WC  . enoxaparin (LOVENOX) injection  40 mg Subcutaneous Q24H  . furosemide  80 mg Intravenous Q12H  . insulin aspart  0-5 Units Subcutaneous QHS  . insulin aspart  0-9 Units Subcutaneous TID WC  . lactulose  10 g Oral BID  . pantoprazole  40 mg Oral Daily  . sodium chloride flush  3 mL Intravenous Q12H  . sodium polystyrene  45 g Oral Once   Continuous Infusions:  PRN Meds:.sodium chloride, acetaminophen, ondansetron (ZOFRAN) IV, sodium chloride flush    PHYSICAL EXAM: Vital signs in last 24 hours: Filed Vitals:   01/15/16 1230 01/15/16 2027 01/16/16 0538 01/16/16 1223  BP: 116/56 103/57 110/54 101/44  Pulse: 74 67  70 86  Temp: 97.5 F (36.4 C) 98.3 F (36.8 C) 98.2 F (36.8 C) 97.5 F (36.4 C)  TempSrc: Oral Oral Oral  Oral  Resp: 18 18 18 18   Height:      Weight:   151.864 kg (334 lb 12.8 oz)   SpO2: 92% 91% 91% 94%    Weight change: 0.408 kg (14.4 oz) Filed Weights   01/14/16 0440 01/15/16 0542 01/16/16 0538  Weight: 152.5 kg (336 lb 3.2 oz) 151.456 kg (333 lb 14.4 oz) 151.864 kg (334 lb 12.8 oz)   Body mass index is 48.04 kg/(m^2).   Gen Exam: Awake and alert with clear speech.   Neck: Supple, No JVD.  Chest: B/L Clear.   CVS: S1 S2 Regular, no murmurs.  Abdomen: soft, BS +, non tender, distended.  Extremities: +++ edema, lower extremities warm to touch. Chronic venous stasis changes bilaterally Neurologic: Non Focal.  Skin: No Rash.   Wounds: N/A.    Intake/Output from previous day:  Intake/Output Summary (Last 24 hours) at 01/16/16 1344 Last data filed at 01/16/16 0900  Gross per 24 hour  Intake    240 ml  Output    101 ml  Net    139 ml    LAB RESULTS: CBC  Recent Labs Lab 01/13/16 1824 01/14/16 0633 01/15/16 0345 01/16/16 0311  WBC 7.4 6.9 6.8 6.7  HGB 11.0* 9.6* 9.7* 9.9*  HCT 33.1* 30.3* 30.0* 28.9*  PLT 104* 104* 96* 105*  MCV 106.1* 105.2* 105.6* 106.3*  MCH 35.3* 33.3 34.2* 36.4*  MCHC 33.2 31.7 32.3 34.3  RDW 14.9 14.9 15.0 15.4  LYMPHSABS  --  0.9  --   --   MONOABS  --  0.6  --   --   EOSABS  --  0.2  --   --   BASOSABS  --  0.0  --   --     Chemistries   Recent Labs Lab 01/13/16 1824 01/14/16 0025 01/14/16 0633 01/15/16 0345 01/16/16 0311  NA 133* 130* 134* 137 135  K 5.2* 4.7 5.0 5.4* 5.2*  CL 96* 96* 96* 100* 97*  CO2 26 26 27 26 26   GLUCOSE 126* 127* 118* 110* 111*  BUN 26* 26* 27* 31* 37*  CREATININE 2.82* 2.79* 2.78* 3.14* 4.36*  CALCIUM 10.0 9.9 9.3 9.4 9.3    CBG:  Recent Labs Lab 01/15/16 1135 01/15/16 1657 01/15/16 2136 01/16/16 0710 01/16/16 1156  GLUCAP 119* 114* 105* 101* 114*    GFR Estimated Creatinine Clearance: 23 mL/min (by C-G formula based on Cr of 4.36).  Coagulation profile  Recent Labs Lab  01/14/16 0025  INR 1.46    Cardiac Enzymes  Recent Labs Lab 01/13/16 2236 01/14/16 0259 01/14/16 0633  TROPONINI 0.25* 0.30* 0.25*    Invalid input(s): POCBNP No results for input(s): DDIMER in the last 72 hours.  Recent Labs  01/14/16 0025  HGBA1C 5.9*   No results for input(s): CHOL, HDL, LDLCALC, TRIG, CHOLHDL, LDLDIRECT in the last 72 hours.  Recent Labs  01/14/16 0633  TSH 1.810    Recent Labs  01/14/16 1807 01/15/16 0345  VITAMINB12  --  406  FOLATE  --  9.2  FERRITIN  --  374*  TIBC  --  242*  IRON  --  91  RETICCTPCT 1.2 1.3   No results for input(s): LIPASE, AMYLASE in the last 72 hours.  Urine Studies No results for input(s): UHGB, CRYS in the last 72 hours.  Invalid  input(s): UACOL, UAPR, USPG, UPH, UTP, UGL, UKET, UBIL, UNIT, UROB, ULEU, UEPI, UWBC, URBC, UBAC, CAST, UCOM, BILUA  MICROBIOLOGY: No results found for this or any previous visit (from the past 240 hour(s)).  RADIOLOGY STUDIES/RESULTS: Dg Chest 2 View  01/13/2016  CLINICAL DATA:  Shortness of Breath EXAM: CHEST  2 VIEW COMPARISON:  01/08/2014 FINDINGS: Borderline cardiomegaly. Central mild vascular congestion without convincing pulmonary edema. There is small bilateral pleural effusion with bilateral basilar atelectasis or infiltrate. IMPRESSION: Central vascular congestion without convincing pulmonary edema. Small bilateral pleural effusion with bilateral basilar atelectasis or infiltrate. Electronically Signed   By: Natasha Mead M.D.   On: 01/13/2016 18:53   US Renal  01/14/2016  CLINICAL DATA:  Chronic renal disease EXAM: RENAL / URINARY TRACT ULTRASOUND COMPLETE COMPARISON:  CT abdomen and pelvis December 01, 2014 FINDINGS: Right Kidney: Length: 13.9 cm. Echogenicity and renal cortical thickness are within normal limits. No perinephric fluid or hydronephrosis visualized. There is a cyst arising from the lower pole of the right kidney measuring 1.8 x 1.9 x 1.9 cm. No sonographically  demonstrable calculus or ureterectasis. Left Kidney: Length: 12.8 cm. Echogenicity and renal cortical thickness are within normal limits. No mass, perinephric fluid, or hydronephrosis visualized. No sonographically demonstrable calculus or ureterectasis. Bladder: Appears normal for degree of bladder distention. Incidental note is made of a small splenule adjacent to the spleen. A small amount of ascites is noted near the liver edge. IMPRESSION: Small right renal cyst.  Kidneys otherwise appear unremarkable. Small amount of ascites near the liver edge. Small accessory spleen noted incidentally. Electronically Signed   By: Bretta Bang III M.D.   On: 01/14/2016 10:37   US Abdomen Limited  01/14/2016  CLINICAL DATA:  Hepatic cirrhosis with abdominal distention EXAM: LIMITED ABDOMEN ULTRASOUND FOR ASCITES TECHNIQUE: Limited ultrasound survey for ascites was performed in all four abdominal quadrants. COMPARISON:  CT abdomen and pelvis December 01, 2014 FINDINGS: There is rather minimal ascites adjacent to the liver. Peristalsing bowel is seen elsewhere. The contour of the liver is nodular with increased liver echogenicity consistent with underlying hepatic cirrhosis. IMPRESSION: Rather minimal amount of ascites.  Hepatic cirrhosis. Electronically Signed   By: Bretta Bang III M.D.   On: 01/14/2016 10:31    Jeoffrey Massed, MD  [facility] Pager:[phone]  If 7PM-7AM, please contact night-coverage [email or web address] Password [email or web address] 01/16/2016, 1:44 PM   LOS: 3 days

## 2016-01-16 NOTE — Evaluation (Signed)
Occupational Therapy Evaluation Patient Details Name: Maurice Paul MRN: [id number] DOB: 01/04/1946 Today's Date: 01/16/2016    History of Present Illness Maurice Paul is a 70 y.o. male with Past medical history of peripheral vascular disease, essential hypertension, chronic stasis dermatitis, type 2 diabetes mellitus not on any medication, cirrhosis of the liver nonalcoholic, history of DVT. Admitted for principle problems of anasarca, ARF, Liver cirrhosis-cryptogenic, hyperkalemia, and thrombocytopenia   Clinical Impression   Pt was independent in self care and ambulated without a device. He stood to take a shower and reports difficulty rising from low surfaces prior to admission. Pt presents with generalized weakness and decreased activity tolerance. He requires assistance for LB bathing and dressing and supervision for standing grooming and toileting. Will follow acutely.    Follow Up Recommendations  No OT follow up    Equipment Recommendations       Recommendations for Other Services       Precautions / Restrictions Precautions Precautions: Fall Precaution Comments: monitor O2 Restrictions Weight Bearing Restrictions: No      Mobility Bed Mobility Overal bed mobility: Needs Assistance Bed Mobility: Sit to Supine       Sit to supine: Min assist   General bed mobility comments: assisted LEs up into bed  Transfers Overall transfer level: Needs assistance Equipment used: None Transfers: Sit to/from Stand Sit to Stand: Supervision         General transfer comment: for safety, pt stands momentarily prior to ambulating    Balance     Sitting balance-Leahy Scale: Normal       Standing balance-Leahy Scale: Good                              ADL Overall ADL's : Needs assistance/impaired Eating/Feeding: Independent;Sitting   Grooming: Wash/dry hands;Standing;Supervision/safety   Upper Body Bathing: Set up;Sitting   Lower Body Bathing:  Moderate assistance;Sit to/from stand   Upper Body Dressing : Set up;Sitting   Lower Body Dressing: Moderate assistance;Sit to/from stand   Toilet Transfer: Supervision/safety;Ambulation;Comfort height toilet;Grab bars   Toileting- Clothing Manipulation and Hygiene: Minimal assistance;Sit to/from stand       Functional mobility during ADLs: Supervision/safety (stabilizes on sink, door knob, footboard of bed) General ADL Comments: Pt with decreased activity tolerance.     Vision     Perception     Praxis      Pertinent Vitals/Pain Pain Assessment: No/denies pain     Hand Dominance Right   Extremity/Trunk Assessment Upper Extremity Assessment Upper Extremity Assessment: Generalized weakness   Lower Extremity Assessment Lower Extremity Assessment: Generalized weakness (edematous LEs)       Communication Communication Communication: No difficulties   Cognition Arousal/Alertness: Awake/alert Behavior During Therapy: WFL for tasks assessed/performed Overall Cognitive Status: Within Functional Limits for tasks assessed                     General Comments       Exercises       Shoulder Instructions      Home Living Family/patient expects to be discharged to:: Private residence Living Arrangements: Spouse/significant other Available Help at Discharge: Family;Available 24 hours/day Type of Home: House Home Access: Stairs to enter Entergy CorporationEntrance Stairs-Number of Steps: 3 Entrance Stairs-Rails: Left Home Layout: Two level Alternate Level Stairs-Number of Steps: flight Alternate Level Stairs-Rails: Left Bathroom Shower/Tub: Producer, television/film/videoWalk-in shower   Bathroom Toilet: Standard     Home Equipment: [employer] Service[employer] CompanyCane -  single point;Grab bars - tub/shower;Grab bars - toilet   Additional Comments: pt uses bathroom which has not been modified, pulls up from toilet using vanity, reports having broken vanities.      Prior Functioning/Environment Level of Independence: Independent         Comments: furniture walks. Goes into community without device    OT Diagnosis: Generalized weakness   OT Problem List: Decreased strength;Decreased activity tolerance;Impaired balance (sitting and/or standing);Decreased knowledge of use of DME or AE;Decreased safety awareness;Cardiopulmonary status limiting activity;Obesity;Increased edema   OT Treatment/Interventions: Self-care/ADL training;DME and/or AE instruction;Energy conservation;Patient/family education;Balance training    OT Goals(Current goals can be found in the care plan section) Acute Rehab OT Goals Patient Stated Goal: Get better OT Goal Formulation: With patient Time For Goal Achievement: 01/30/16 Potential to Achieve Goals: Good ADL Goals Pt Will Perform Grooming: with modified independence;standing Pt Will Perform Lower Body Bathing: with modified independence;with adaptive equipment;sit to/from stand Pt Will Perform Lower Body Dressing: with modified independence;with adaptive equipment;sit to/from stand Pt Will Transfer to Toilet: with modified independence;ambulating Pt Will Perform Toileting - Clothing Manipulation and hygiene: with modified independence;sit to/from stand Pt Will Perform Tub/Shower Transfer: Shower transfer;with supervision;ambulating;shower seat Additional ADL Goal #1: Pt will generalize energy conservation strategies in ADL independently.  OT Frequency: Min 2X/week   Barriers to D/C:            Co-evaluation              End of Session Equipment Utilized During Treatment: Gait belt  Activity Tolerance: Patient limited by fatigue Patient left: in bed;with call bell/phone within reach   Time: 1610-9604 OT Time Calculation (min): 15 min Charges:  OT General Charges $OT Visit: 1 Procedure OT Evaluation $OT Eval Moderate Complexity: 1 Procedure G-Codes:    Evern Bio 01/16/2016, 9:46 AM  [phone]

## 2016-01-16 NOTE — Anesthesia Postprocedure Evaluation (Signed)
Anesthesia Post Note  Patient: Maurice Paul  Procedure(s) Performed: Procedure(s) (LRB): INSERTION OF DIALYSIS CATHETER ULTRASOUND GUIDED (N/A)  Patient location during evaluation: PACU Anesthesia Type: MAC Level of consciousness: awake Pain management: pain level controlled Vital Signs Assessment: post-procedure vital signs reviewed and stable Respiratory status: spontaneous breathing and respiratory function stable Cardiovascular status: stable Postop Assessment: no signs of nausea or vomiting Anesthetic complications: no    Last Vitals:  Filed Vitals:   01/16/16 1711 01/16/16 1715  BP: 94/44   Pulse: 56 60  Temp:  36.6 C  Resp: 19 18    Last Pain:  Filed Vitals:   01/16/16 1715  PainSc: 0-No pain                 [name]

## 2016-01-17 ENCOUNTER — Inpatient Hospital Stay (HOSPITAL_COMMUNITY): Payer: Medicare Other

## 2016-01-17 DIAGNOSIS — I5033 Acute on chronic diastolic (congestive) heart failure: Secondary | ICD-10-CM

## 2016-01-17 DIAGNOSIS — R570 Cardiogenic shock: Secondary | ICD-10-CM

## 2016-01-17 LAB — PROTIME-INR
INR: 1.57 — AB (ref 0.00–1.49)
PROTHROMBIN TIME: 18.8 s — AB (ref 11.6–15.2)

## 2016-01-17 LAB — BLOOD GAS, ARTERIAL
Acid-base deficit: 1.4 mmol/L (ref 0.0–2.0)
Bicarbonate: 26.6 mEq/L — ABNORMAL HIGH (ref 20.0–24.0)
DRAWN BY: 275531
O2 CONTENT: 2 L/min
O2 SAT: 88.4 %
PH ART: 7.153 — AB (ref 7.350–7.450)
Patient temperature: 98.6
TCO2: 29 mmol/L (ref 0–100)
pCO2 arterial: 79 mmHg (ref 35.0–45.0)
pO2, Arterial: 61.6 mmHg — ABNORMAL LOW (ref 80.0–100.0)

## 2016-01-17 LAB — COMPREHENSIVE METABOLIC PANEL
ALT: 22 U/L (ref 17–63)
ANION GAP: 13 (ref 5–15)
AST: 41 U/L (ref 15–41)
Albumin: 3.5 g/dL (ref 3.5–5.0)
Alkaline Phosphatase: 57 U/L (ref 38–126)
BUN: 40 mg/dL — ABNORMAL HIGH (ref 6–20)
CALCIUM: 9 mg/dL (ref 8.9–10.3)
CHLORIDE: 99 mmol/L — AB (ref 101–111)
CO2: 26 mmol/L (ref 22–32)
Creatinine, Ser: 5.65 mg/dL — ABNORMAL HIGH (ref 0.61–1.24)
GFR calc non Af Amer: 9 mL/min — ABNORMAL LOW (ref >60)
GFR, EST AFRICAN AMERICAN: 11 mL/min — AB (ref >60)
Glucose, Bld: 135 mg/dL — ABNORMAL HIGH (ref 65–99)
Potassium: 6 mmol/L — ABNORMAL HIGH (ref 3.5–5.1)
SODIUM: 138 mmol/L (ref 135–145)
Total Bilirubin: 1.5 mg/dL — ABNORMAL HIGH (ref 0.3–1.2)
Total Protein: 7.7 g/dL (ref 6.5–8.1)

## 2016-01-17 LAB — CBC WITH DIFFERENTIAL/PLATELET
BASOS PCT: 0 %
Basophils Absolute: 0 10*3/uL (ref 0.0–0.1)
EOS ABS: 0 10*3/uL (ref 0.0–0.7)
Eosinophils Relative: 0 %
HCT: 33.9 % — ABNORMAL LOW (ref 39.0–52.0)
Hemoglobin: 10.7 g/dL — ABNORMAL LOW (ref 13.0–17.0)
Lymphocytes Relative: 6 %
Lymphs Abs: 1 10*3/uL (ref 0.7–4.0)
MCH: 35.3 pg — AB (ref 26.0–34.0)
MCHC: 31.6 g/dL (ref 30.0–36.0)
MCV: 111.9 fL — ABNORMAL HIGH (ref 78.0–100.0)
MONO ABS: 0.8 10*3/uL (ref 0.1–1.0)
Monocytes Relative: 5 %
NEUTROS ABS: 14.5 10*3/uL — AB (ref 1.7–7.7)
Neutrophils Relative %: 89 %
PLATELETS: 91 10*3/uL — AB (ref 150–400)
RBC: 3.03 MIL/uL — ABNORMAL LOW (ref 4.22–5.81)
RDW: 15.9 % — ABNORMAL HIGH (ref 11.5–15.5)
WBC: 16.3 10*3/uL — ABNORMAL HIGH (ref 4.0–10.5)

## 2016-01-17 LAB — URINALYSIS, ROUTINE W REFLEX MICROSCOPIC
Glucose, UA: NEGATIVE mg/dL
KETONES UR: 15 mg/dL — AB
NITRITE: POSITIVE — AB
PH: 6.5 (ref 5.0–8.0)
Protein, ur: >300 mg/dL — AB
SPECIFIC GRAVITY, URINE: 1.037 — AB (ref 1.005–1.030)

## 2016-01-17 LAB — CBC
HCT: 33.7 % — ABNORMAL LOW (ref 39.0–52.0)
HCT: 33.5 % — ABNORMAL LOW (ref 39.0–52.0)
HEMOGLOBIN: 10.3 g/dL — AB (ref 13.0–17.0)
Hemoglobin: 10.6 g/dL — ABNORMAL LOW (ref 13.0–17.0)
MCH: 33.7 pg (ref 26.0–34.0)
MCH: 35.2 pg — ABNORMAL HIGH (ref 26.0–34.0)
MCHC: 30.6 g/dL (ref 30.0–36.0)
MCHC: 31.6 g/dL (ref 30.0–36.0)
MCV: 110.1 fL — ABNORMAL HIGH (ref 78.0–100.0)
MCV: 111.3 fL — AB (ref 78.0–100.0)
PLATELETS: 117 10*3/uL — AB (ref 150–400)
PLATELETS: 96 10*3/uL — AB (ref 150–400)
RBC: 3.06 MIL/uL — AB (ref 4.22–5.81)
RBC: 3.01 MIL/uL — AB (ref 4.22–5.81)
RDW: 15.9 % — ABNORMAL HIGH (ref 11.5–15.5)
RDW: 15.9 % — AB (ref 11.5–15.5)
WBC: 8.2 10*3/uL (ref 4.0–10.5)
WBC: 18.2 10*3/uL — AB (ref 4.0–10.5)

## 2016-01-17 LAB — POCT I-STAT 3, ART BLOOD GAS (G3+)
Acid-base deficit: 2 mmol/L (ref 0.0–2.0)
Bicarbonate: 28.1 mEq/L — ABNORMAL HIGH (ref 20.0–24.0)
O2 Saturation: 91 %
PCO2 ART: 75.3 mmHg — AB (ref 35.0–45.0)
PO2 ART: 77 mmHg — AB (ref 80.0–100.0)
Patient temperature: 97.6
TCO2: 30 mmol/L (ref 0–100)
pH, Arterial: 7.177 — CL (ref 7.350–7.450)

## 2016-01-17 LAB — GLUCOSE, CAPILLARY
GLUCOSE-CAPILLARY: 129 mg/dL — AB (ref 65–99)
Glucose-Capillary: 120 mg/dL — ABNORMAL HIGH (ref 65–99)
Glucose-Capillary: 150 mg/dL — ABNORMAL HIGH (ref 65–99)
Glucose-Capillary: 115 mg/dL — ABNORMAL HIGH (ref 65–99)

## 2016-01-17 LAB — URINE MICROSCOPIC-ADD ON: Squamous Epithelial / LPF: NONE SEEN

## 2016-01-17 LAB — HEPATITIS B SURFACE ANTIGEN
HEP B S AG: NEGATIVE
Hepatitis B Surface Ag: NEGATIVE

## 2016-01-17 LAB — RENAL FUNCTION PANEL
ALBUMIN: 3.7 g/dL (ref 3.5–5.0)
ANION GAP: 12 (ref 5–15)
Albumin: 3.7 g/dL (ref 3.5–5.0)
Anion gap: 12 (ref 5–15)
BUN: 43 mg/dL — ABNORMAL HIGH (ref 6–20)
BUN: 40 mg/dL — AB (ref 6–20)
CALCIUM: 9.1 mg/dL (ref 8.9–10.3)
CALCIUM: 8.8 mg/dL — AB (ref 8.9–10.3)
CHLORIDE: 96 mmol/L — AB (ref 101–111)
CO2: 26 mmol/L (ref 22–32)
CO2: 27 mmol/L (ref 22–32)
CREATININE: 5.67 mg/dL — AB (ref 0.61–1.24)
CREATININE: 5.71 mg/dL — AB (ref 0.61–1.24)
Chloride: 96 mmol/L — ABNORMAL LOW (ref 101–111)
GFR calc Af Amer: 10 mL/min — ABNORMAL LOW (ref >60)
GFR, EST AFRICAN AMERICAN: 10 mL/min — AB (ref >60)
GFR, EST NON AFRICAN AMERICAN: 9 mL/min — AB (ref >60)
GFR, EST NON AFRICAN AMERICAN: 9 mL/min — AB (ref >60)
Glucose, Bld: 134 mg/dL — ABNORMAL HIGH (ref 65–99)
Glucose, Bld: 148 mg/dL — ABNORMAL HIGH (ref 65–99)
PHOSPHORUS: 7.5 mg/dL — AB (ref 2.5–4.6)
PHOSPHORUS: 8.2 mg/dL — AB (ref 2.5–4.6)
Potassium: 6.4 mmol/L (ref 3.5–5.1)
Potassium: 6.2 mmol/L (ref 3.5–5.1)
SODIUM: 134 mmol/L — AB (ref 135–145)
Sodium: 135 mmol/L (ref 135–145)

## 2016-01-17 LAB — PROCALCITONIN: Procalcitonin: 0.36 ng/mL

## 2016-01-17 LAB — LACTIC ACID, PLASMA
LACTIC ACID, VENOUS: 1.9 mmol/L (ref 0.5–2.0)
LACTIC ACID, VENOUS: 2.1 mmol/L — AB (ref 0.5–2.0)

## 2016-01-17 LAB — AMMONIA
AMMONIA: 41 umol/L — AB (ref 9–35)
Ammonia: 36 umol/L — ABNORMAL HIGH (ref 9–35)

## 2016-01-17 LAB — PHOSPHORUS: PHOSPHORUS: 8.2 mg/dL — AB (ref 2.5–4.6)

## 2016-01-17 LAB — D-DIMER, QUANTITATIVE: D-Dimer, Quant: 15.39 ug/mL-FEU — ABNORMAL HIGH (ref 0.00–0.50)

## 2016-01-17 LAB — TROPONIN I: TROPONIN I: 0.29 ng/mL — AB (ref <0.031)

## 2016-01-17 LAB — FIBRINOGEN: FIBRINOGEN: 236 mg/dL (ref 204–475)

## 2016-01-17 MED ORDER — SODIUM CHLORIDE 0.9 % IV SOLN: INTRAVENOUS | Status: DC | PRN

## 2016-01-17 MED ORDER — PRISMASOL BGK 4/2.5 32-4-2.5 MEQ/L IV SOLN
INTRAVENOUS | Status: DC
  Administered 2016-01-17 – 2016-01-25 (×13): via INTRAVENOUS_CENTRAL
  Filled 2016-01-17 (×16): qty 5000

## 2016-01-17 MED ORDER — NOREPINEPHRINE BITARTRATE 1 MG/ML IV SOLN
2.0000 ug/min | INTRAVENOUS | Status: DC
  Administered 2016-01-17: 10 ug/min via INTRAVENOUS
  Filled 2016-01-17: qty 4

## 2016-01-17 MED ORDER — LACTULOSE ENEMA
300.0000 mL | Freq: Once | ORAL | Status: DC
  Filled 2016-01-17: qty 300

## 2016-01-17 MED ORDER — SODIUM CHLORIDE 0.9 % IV BOLUS (SEPSIS)
500.0000 mL | Freq: Once | INTRAVENOUS | Status: AC
  Administered 2016-01-17: 500 mL via INTRAVENOUS

## 2016-01-17 MED ORDER — FENTANYL CITRATE (PF) 100 MCG/2ML IJ SOLN
INTRAMUSCULAR | Status: AC
  Filled 2016-01-17: qty 2

## 2016-01-17 MED ORDER — VANCOMYCIN HCL IN DEXTROSE 1-5 GM/200ML-% IV SOLN: 1000.0000 mg | INTRAVENOUS | Status: DC

## 2016-01-17 MED ORDER — NALOXONE HCL 0.4 MG/ML IJ SOLN
0.4000 mg | INTRAMUSCULAR | Status: DC | PRN
  Administered 2016-01-17: 0.4 mg via INTRAVENOUS

## 2016-01-17 MED ORDER — NALOXONE HCL 0.4 MG/ML IJ SOLN
INTRAMUSCULAR | Status: AC
  Administered 2016-01-17: 0.4 mg via INTRAVENOUS
  Filled 2016-01-17: qty 1

## 2016-01-17 MED ORDER — PRISMASOL BGK 4/2.5 32-4-2.5 MEQ/L IV SOLN
INTRAVENOUS | Status: DC
  Administered 2016-01-17 – 2016-01-25 (×16): via INTRAVENOUS_CENTRAL
  Filled 2016-01-17 (×15): qty 5000

## 2016-01-17 MED ORDER — HEPARIN SODIUM (PORCINE) 1000 UNIT/ML DIALYSIS
20.0000 [IU]/kg | INTRAMUSCULAR | Status: DC | PRN
  Administered 2016-01-25: 3000 [IU] via INTRAVENOUS_CENTRAL
  Filled 2016-01-17: qty 3

## 2016-01-17 MED ORDER — METHYLPREDNISOLONE SODIUM SUCC 40 MG IJ SOLR
40.0000 mg | Freq: Four times a day (QID) | INTRAMUSCULAR | Status: DC
  Administered 2016-01-17 – 2016-01-18 (×3): 40 mg via INTRAVENOUS
  Filled 2016-01-17 (×4): qty 1

## 2016-01-17 MED ORDER — VANCOMYCIN HCL 10 G IV SOLR
1500.0000 mg | INTRAVENOUS | Status: DC
  Administered 2016-01-18: 1500 mg via INTRAVENOUS
  Filled 2016-01-17 (×2): qty 1500

## 2016-01-17 MED ORDER — VANCOMYCIN HCL 500 MG IV SOLR
500.0000 mg | Freq: Once | INTRAVENOUS | Status: AC
  Administered 2016-01-17: 500 mg via INTRAVENOUS
  Filled 2016-01-17: qty 500

## 2016-01-17 MED ORDER — HEPARIN (PORCINE) 2000 UNITS/L FOR CRRT
INTRAVENOUS_CENTRAL | Status: DC | PRN
  Filled 2016-01-17: qty 1000

## 2016-01-17 MED ORDER — OXYCODONE-ACETAMINOPHEN 5-325 MG PO TABS
ORAL_TABLET | ORAL | Status: AC
  Administered 2016-01-17: 2 via ORAL
  Filled 2016-01-17: qty 2

## 2016-01-17 MED ORDER — HEPARIN SODIUM (PORCINE) 1000 UNIT/ML DIALYSIS
1000.0000 [IU] | INTRAMUSCULAR | Status: DC | PRN
Start: 2016-01-17 — End: 2016-01-25
  Filled 2016-01-17: qty 6

## 2016-01-17 MED ORDER — PIPERACILLIN-TAZOBACTAM IN DEX 2-0.25 GM/50ML IV SOLN
2.2500 g | Freq: Three times a day (TID) | INTRAVENOUS | Status: DC
  Filled 2016-01-17 (×4): qty 50

## 2016-01-17 MED ORDER — SODIUM CHLORIDE 0.9 % IV BOLUS (SEPSIS)
250.0000 mL | Freq: Once | INTRAVENOUS | Status: AC
  Administered 2016-01-17: 250 mL via INTRAVENOUS

## 2016-01-17 MED ORDER — HEPARIN BOLUS VIA INFUSION (CRRT)
1000.0000 [IU] | INTRAVENOUS | Status: DC | PRN
  Filled 2016-01-17: qty 1000

## 2016-01-17 MED ORDER — PRISMASOL BGK 4/2.5 32-4-2.5 MEQ/L IV SOLN
INTRAVENOUS | Status: DC
  Administered 2016-01-17 – 2016-01-18 (×5): via INTRAVENOUS_CENTRAL
  Filled 2016-01-17 (×11): qty 5000

## 2016-01-17 MED ORDER — SODIUM CHLORIDE 0.9 % IJ SOLN
250.0000 [IU]/h | INTRAMUSCULAR | Status: DC
  Administered 2016-01-17: 250 [IU]/h via INTRAVENOUS_CENTRAL
  Administered 2016-01-17: 1000 [IU]/h via INTRAVENOUS_CENTRAL
  Administered 2016-01-18: 1050 [IU]/h via INTRAVENOUS_CENTRAL
  Filled 2016-01-17 (×3): qty 2

## 2016-01-17 MED ORDER — VASOPRESSIN 20 UNIT/ML IV SOLN
0.0300 [IU]/min | INTRAVENOUS | Status: DC
  Administered 2016-01-17 – 2016-01-19 (×3): 0.04 [IU]/min via INTRAVENOUS
  Filled 2016-01-17 (×3): qty 2

## 2016-01-17 MED ORDER — PANTOPRAZOLE SODIUM 40 MG IV SOLR
40.0000 mg | INTRAVENOUS | Status: DC
  Administered 2016-01-17 – 2016-01-24 (×8): 40 mg via INTRAVENOUS
  Filled 2016-01-17 (×8): qty 40

## 2016-01-17 MED ORDER — HEPARIN (PORCINE) IN NACL 100-0.45 UNIT/ML-% IJ SOLN: 2000.0000 [IU]/h | INTRAMUSCULAR | Status: DC

## 2016-01-17 MED ORDER — VANCOMYCIN HCL 10 G IV SOLR
2000.0000 mg | INTRAVENOUS | Status: AC
  Administered 2016-01-17: 2000 mg via INTRAVENOUS
  Filled 2016-01-17 (×2): qty 2000

## 2016-01-17 MED ORDER — NOREPINEPHRINE BITARTRATE 1 MG/ML IV SOLN
2.0000 ug/min | INTRAVENOUS | Status: DC
  Administered 2016-01-17: 15 ug/min via INTRAVENOUS
  Administered 2016-01-18: 29 ug/min via INTRAVENOUS
  Administered 2016-01-18 – 2016-01-19 (×2): 25 ug/min via INTRAVENOUS
  Filled 2016-01-17 (×4): qty 16

## 2016-01-17 MED ORDER — PIPERACILLIN-TAZOBACTAM IN DEX 2-0.25 GM/50ML IV SOLN
2.2500 g | Freq: Four times a day (QID) | INTRAVENOUS | Status: DC
  Administered 2016-01-17 – 2016-01-20 (×11): 2.25 g via INTRAVENOUS
  Filled 2016-01-17 (×13): qty 50

## 2016-01-17 MED ORDER — HEPARIN SODIUM (PORCINE) 1000 UNIT/ML DIALYSIS: 20.0000 [IU]/kg | INTRAMUSCULAR | Status: DC | PRN

## 2016-01-17 MED ORDER — IPRATROPIUM-ALBUTEROL 0.5-2.5 (3) MG/3ML IN SOLN
3.0000 mL | RESPIRATORY_TRACT | Status: DC
  Administered 2016-01-17 – 2016-01-18 (×5): 3 mL via RESPIRATORY_TRACT
  Filled 2016-01-17 (×5): qty 3

## 2016-01-17 NOTE — Progress Notes (Signed)
ANTICOAGULATION CONSULT NOTE - Initial Consult  Pharmacy Consult for Heparin + Vanco/Zosyn Indication: r/o PE, aspiration PNA  Allergies  Allergen Reactions  . Sulfa Antibiotics     Hives  . Onglyza [Saxagliptin] Rash    Patient Measurements: Height: 5\' 10"  (177.8 cm) Weight: (!) 334 lb 3.5 oz (151.6 kg) IBW/kg (Calculated) : 73 Heparin Dosing Weight:  109 kg  Vital Signs: Temp: 97.5 F (36.4 C) (04/10 1231) Temp Source: Oral (04/10 1231) BP: 88/45 mmHg (04/10 1418) Pulse Rate: 56 (04/10 1418)  Labs:  Recent Labs  01/15/16 0345 01/16/16 0311 01/17/16 0237  HGB 9.7* 9.9* 10.3*  HCT 30.0* 28.9* 33.7*  PLT 96* 105* 117*  CREATININE 3.14* 4.36* 5.67*    Estimated Creatinine Clearance: 17.6 mL/min (by C-G formula based on Cr of 5.67).   Medical History: Past Medical History  Diagnosis Date  . PVD (peripheral vascular disease) (HCC)   . Tobacco chew use     for 30 years  . Hypertension   . SDH (subdural hematoma) (HCC) 03/2014    fall  . DVT (deep venous thrombosis) (HCC)     not on anticoagulation due to hematuria  . Stasis dermatitis   . Cirrhosis of liver (HCC)   . Diabetes mellitus, type 2 [facility](HCC)     Assessment:  70 year old male with history of cryptogenic liver cirrhosis (refused biopsy), hypertension, type 2 diabetes who presented with worsening renal failure and anasarca. Anasarca thought to be secondary to Hospital course has been complicated by development of an anuria/oliguric renal failure requiring dialysis (HD started 4/10).  Anticoagulation: h/o DVT 2015. LE dopplers negative per Dr. Jerral RalphGhimire note. Ddimer 15.39  Infectious Disease: Chronic venous stasis dermatitis, stop Keflex. Start Vanco/Zosyn for aspiration PNA. Dose for new ESRD  4/10 Vanco>> 4/10 Zosyn>>   Goal of Therapy:  Heparin level 0.3-0.7 units/ml Monitor platelets by anticoagulation protocol: Yes  Vancomycin level <15 prior to re-dosing post-HD.   Plan:  IV heparin (no  bolus per Ghimire) at 2000 units/hr F/u VQ scan. Size? Zosyn 2.25g IV q8hr Vancomycin 2g IV load, then 1g with HD    Jackalyn Haith S. Merilynn Finlandobertson, PharmD, BCPS Clinical Staff Pharmacist Pager [phone]  Misty Stanleyobertson, Gatsby Chismar Stillinger 01/17/2016,2:37 PM

## 2016-01-17 NOTE — Progress Notes (Signed)
CRITICAL VALUE ALERT  Critical value received:  Lactic Acid 2.1  Date of notification:  01/17/2016  Time of notification:  1914  Critical value read back:Yes.    Nurse who received alert:  Louie BunSummer Ha Placeres, RN  MD notified (1st page):  Dr. Mikey BussingHoffman  Time of first page:  1914  MD notified (2nd page):  Time of second page:  Responding MD:  Dr. Mikey BussingHoffman  Time MD responded:  [phone]

## 2016-01-17 NOTE — Progress Notes (Addendum)
PCCM Attending Note: Patient evaluated at bedside. Hypercarbic and hypotensive. Patient more alert after Duoneb treatment. Answering questions appropriately. Patient is aware of year and president as well as person. Starting Solu-Medrol IV q6hr as well. No wheezing but distant breath sounds. Adding Vasopressin for BP support. Patient oozing from central line & thrombocytopenic. Holding systemic heparin at this time & checking stat serum coags. Continuing duonebs q4hr. Holding off on intubation at this time with close monitoring. Arterial line placed for close monitoring of BP. Starting on CVVHD.  Donna ChristenJennings E. Jamison NeighborNestor, M.D. [facility] Pager:  [phone] After 3pm or if no response, call [phone] 9:18 PM 01/17/2016

## 2016-01-17 NOTE — Progress Notes (Signed)
Patient with a decline in clinical status over the day- decreased MS and  BP in the low 80's - WBC up to 18 K- BP not responsive to fluid after HD AND K is back up again to 6.2.  Given his hemodynamics I suspect we need to do CRRT- have informed CCM and nursing- will put in orders - keep even- use low dose heparin.   Seidy Labreck A

## 2016-01-17 NOTE — Progress Notes (Addendum)
PATIENT DETAILS Name: Maurice Paul Age: 70 y.o. Sex: male Date of Birth: 11-11-1945 Admit Date: 01/13/2016 Admitting Physician Rolly Salter, MD ZOX:[id number] PATRICK, MD  Brief narrative: 70 year old male with history of cryptogenic liver cirrhosis (refused biopsy), hypertension, type 2 diabetes who presented with worsening renal failure and anasarca. Anasarca thought to be secondary to right heart failure and liver cirrhosis. Hospital course has been complicated by development of an anuria/oliguric renal failure requiring dialysis (HD started 4/10).  Subjective: Became hypoxic last night requiring significant amount of oxygen-oxygen now being tapered off after dialysis. Vomiting 1 this morning. Seems slow but answering all questions appropriately.  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 Unfortunately no response to high dosing of IV diuretics, now oliguric with hyperkalemia-hemodialysis started on 4/10. 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 Awaiting VQ scan, but lower extremity Doppler was negative.  Active Problems: Oliguric XBJ:YNWGN review of labs from [facility]-it appears that ARF started around 2 months back.Renal following, suspicion for hemodynamic mediated renal failure. Renal ultrasound negative for hydronephrosis.Unfortunately, now oliguric with hyperkalemia, underwent hemodialysis on 4/10.  Acute hypoxic respiratory failure: Suspect secondary to pulmonary edema in a setting of oliguria/worsening renal function. Check chest x-ray. Initially was on 6 L, but after dialysis he has been tapered down to 3 L. Lower extremity Dopplers were negative for DVT, awaiting VQ scan to rule out chronic pulmonary embolism.  Right-sided heart failure: Etiology unclear-not sure this is from OSA/OHS or  chronic pulmonary embolism-Cardiology following, planning on a VQ scan to rule out chronic PE. Cardiology following.  Liver cirrhosis-cryptogenic: Apparently has had extensive workup with [facility]-per patient workup has been negative. He has refused a liver biopsy in the past. No significant ascites seen on abdominal ultrasound.  Hyperkalemia: Given Kayexalate over the past few days, however continues to have persistent hyperkalemia-this is secondary to worsening renal function. Underwent hemodialysis today, repeat electrolytes tomorrow.  Mild hyponatremia: Resolved-Secondary to hypervolemia, should improve with diuresis.  Minimally elevated troponins: Trend is flat, not consistent with ACS. Likely demand ischemia or false positive elevation in a setting of CKD. Await echo  Thrombocytopenia: Likely secondary to liver cirrhosis. Spoke with Dr Myrle Sheng on 4/8 who reviewed labs, chart over the phone-did not think patient had a TTP, he thinks that this is from liver cirrhoses as well.  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  Spoke with Dr Myrle Sheng on 4/8 who reviewed labs, chart over the phone-did not think patient had a TTP, he thinks that this is from liver cirrhoses as well.  Chronic venous stasis dermatitis: See no evidence of cellulitis, discontinue Keflex. Lower extremity Dopplers negative for DVT  Hypertension: All antihypertensives on hold-as HD started . Follow.  Type 2 diabetes: Hold all oral hypoglycemic agents, CBG stable with SSI.  History of traumatic subarachnoid hemorrhage/subdural hemorrhage March 2015  History of left peroneal DVT 2015-provoked-during prolonged hospitalization due to traumatic SAH/SDH  Disposition: Remain inpatient  Antimicrobial agents  See below  Anti-infectives    Start     Dose/Rate Route Frequency Ordered Stop   01/17/16 0600  ceFAZolin (ANCEF) IVPB 1 g/50 mL premix  Status:  Discontinued    Comments:  Send with pt  to OR   1 g 100 mL/hr over 30 Minutes Intravenous To [facility] 01/16/16 2027 01/16/16 2134  01/17/16 0600  ceFAZolin (ANCEF) IVPB 2g/100 mL premix    Comments:  Send with pt to OR   2 g 200 mL/hr over 30 Minutes Intravenous To [facility] 01/16/16 2134 01/18/16 0600   01/17/16 0000  ceFAZolin (ANCEF) IVPB 1 g/50 mL premix  Status:  Discontinued    Comments:  Send with pt to OR   1 g 100 mL/hr over 30 Minutes Intravenous On call 01/16/16 1728 01/16/16 2027   01/13/16 2200  cephALEXin (KEFLEX) capsule 500 mg  Status:  Discontinued     500 mg Oral 2 times daily 01/13/16 2148 01/14/16 1100      DVT Prophylaxis: Prophylactic Lovenox-watch platelets  Code Status: Full code  Family Communication None at bedside  Procedures: None  CONSULTS:  cardiology and nephrology  Time spent 30 minutes-Greater than 50% of this time was spent in counseling, explanation of diagnosis, planning of further management, and coordination of care.  MEDICATIONS: Scheduled Meds: . aspirin EC  325 mg Oral Daily  .  ceFAZolin (ANCEF) IV  2 g Intravenous To SS-Surg  . enoxaparin (LOVENOX) injection  40 mg Subcutaneous Q24H  . insulin aspart  0-5 Units Subcutaneous QHS  . insulin aspart  0-9 Units Subcutaneous TID WC  . lactulose  10 g Oral BID  . pantoprazole  40 mg Oral Daily  . sodium chloride flush  3 mL Intravenous Q12H   Continuous Infusions:  PRN Meds:.sodium chloride, acetaminophen, ondansetron (ZOFRAN) IV, oxyCODONE-acetaminophen, sodium chloride flush    PHYSICAL EXAM: Vital signs in last 24 hours: Filed Vitals:   01/17/16 0930 01/17/16 0941 01/17/16 0945 01/17/16 1026  BP: 98/37 99/35 100/38 126/101  Pulse: 68 71 72   Temp:  97.6 F (36.4 C)    TempSrc:      Resp:  17    Height:      Weight:  151.6 kg (334 lb 3.5 oz)    SpO2:  95%  97%    Weight change:  Filed Weights   01/16/16 0538 01/17/16 0709 01/17/16 0941  Weight: 151.864 kg (334 lb 12.8 oz) 153.6 kg  (338 lb 10 oz) 151.6 kg (334 lb 3.5 oz)   Body mass index is 47.96 kg/(m^2).   Gen Exam: Awake and alert with clear speech.   Neck: Supple, No JVD.  Chest: B/L Clear.  No rales or rhonchi CVS: S1 S2 Regular, no murmurs.  Abdomen: soft, BS +, non tender, distended.  Extremities: ++ edema, lower extremities warm to touch. Chronic venous stasis changes bilaterally Neurologic: Non Focal.  Skin: No Rash.   Wounds: N/A.    Intake/Output from previous day:  Intake/Output Summary (Last 24 hours) at 01/17/16 1216 Last data filed at 01/17/16 0945  Gross per 24 hour  Intake    630 ml  Output   2080 ml  Net  -1450 ml    LAB RESULTS: CBC  Recent Labs Lab 01/13/16 1824 01/14/16 0633 01/15/16 0345 01/16/16 0311 01/17/16 0237  WBC 7.4 6.9 6.8 6.7 8.2  HGB 11.0* 9.6* 9.7* 9.9* 10.3*  HCT 33.1* 30.3* 30.0* 28.9* 33.7*  PLT 104* 104* 96* 105* 117*  MCV 106.1* 105.2* 105.6* 106.3* 110.1*  MCH 35.3* 33.3 34.2* 36.4* 33.7  MCHC 33.2 31.7 32.3 34.3 30.6  RDW 14.9 14.9 15.0 15.4 15.9*  LYMPHSABS  --  0.9  --   --   --   MONOABS  --  0.6  --   --   --   EOSABS  --  0.2  --   --   --   BASOSABS  --  0.0  --   --   --     Chemistries   Recent Labs Lab 01/14/16 0025 01/14/16 0633 01/15/16 0345 01/16/16 0311 01/17/16 0237  NA 130* 134* 137 135 134*  K 4.7 5.0 5.4* 5.2* 6.4*  CL 96* 96* 100* 97* 96*  CO2 26 27 26 26 26   GLUCOSE 127* 118* 110* 111* 134*  BUN 26* 27* 31* 37* 43*  CREATININE 2.79* 2.78* 3.14* 4.36* 5.67*  CALCIUM 9.9 9.3 9.4 9.3 9.1    CBG:  Recent Labs Lab 01/16/16 1156 01/16/16 1601 01/16/16 1833 01/16/16 2058 01/17/16 0534  GLUCAP 114* 108* 129* 134* 120*    GFR Estimated Creatinine Clearance: 17.6 mL/min (by C-G formula based on Cr of 5.67).  Coagulation profile  Recent Labs Lab 01/14/16 0025  INR 1.46    Cardiac Enzymes  Recent Labs Lab 01/13/16 2236 01/14/16 0259 01/14/16 0633  TROPONINI 0.25* 0.30* 0.25*    Invalid input(s):  POCBNP  Recent Labs  01/17/16 1134  DDIMER 15.39*   No results for input(s): HGBA1C in the last 72 hours. No results for input(s): CHOL, HDL, LDLCALC, TRIG, CHOLHDL, LDLDIRECT in the last 72 hours. No results for input(s): TSH, T4TOTAL, T3FREE, THYROIDAB in the last 72 hours.  Invalid input(s): FREET3  Recent Labs  01/14/16 1807 01/15/16 0345  VITAMINB12  --  406  FOLATE  --  9.2  FERRITIN  --  374*  TIBC  --  242*  IRON  --  91  RETICCTPCT 1.2 1.3   No results for input(s): LIPASE, AMYLASE in the last 72 hours.  Urine Studies No results for input(s): UHGB, CRYS in the last 72 hours.  Invalid input(s): UACOL, UAPR, USPG, UPH, UTP, UGL, UKET, UBIL, UNIT, UROB, ULEU, UEPI, UWBC, URBC, UBAC, CAST, UCOM, BILUA  MICROBIOLOGY: Recent Results (from the past 240 hour(s))  Surgical pcr screen     Status: None   Collection Time: 01/16/16 12:14 PM  Result Value Ref Range Status   MRSA, PCR NEGATIVE NEGATIVE Final   Staphylococcus aureus NEGATIVE NEGATIVE Final    Comment:        The Xpert SA Assay (FDA approved for NASAL specimens in patients over 1 years of age), is one component of a comprehensive surveillance program.  Test performance has been validated by [facility] for patients greater than or equal to 52 year old. It is not intended to diagnose infection nor to guide or monitor treatment.     RADIOLOGY STUDIES/RESULTS: Dg Chest 2 View  01/13/2016  CLINICAL DATA:  Shortness of Breath EXAM: CHEST  2 VIEW COMPARISON:  01/08/2014 FINDINGS: Borderline cardiomegaly. Central mild vascular congestion without convincing pulmonary edema. There is small bilateral pleural effusion with bilateral basilar atelectasis or infiltrate. IMPRESSION: Central vascular congestion without convincing pulmonary edema. Small bilateral pleural effusion with bilateral basilar atelectasis or infiltrate. Electronically Signed   By: Natasha Mead M.D.   On: 01/13/2016 18:53   US Renal  01/14/2016   CLINICAL DATA:  Chronic renal disease EXAM: RENAL / URINARY TRACT ULTRASOUND COMPLETE COMPARISON:  CT abdomen and pelvis December 01, 2014 FINDINGS: Right Kidney: Length: 13.9 cm. Echogenicity and renal cortical thickness are within normal limits. No perinephric fluid or hydronephrosis visualized. There is a cyst arising from the lower pole of the right kidney measuring 1.8 x 1.9 x 1.9 cm. No sonographically demonstrable calculus or ureterectasis. Left Kidney: Length: 12.8  cm. Echogenicity and renal cortical thickness are within normal limits. No mass, perinephric fluid, or hydronephrosis visualized. No sonographically demonstrable calculus or ureterectasis. Bladder: Appears normal for degree of bladder distention. Incidental note is made of a small splenule adjacent to the spleen. A small amount of ascites is noted near the liver edge. IMPRESSION: Small right renal cyst.  Kidneys otherwise appear unremarkable. Small amount of ascites near the liver edge. Small accessory spleen noted incidentally. Electronically Signed   By: Bretta Bang III M.D.   On: 01/14/2016 10:37   US Abdomen Limited  01/14/2016  CLINICAL DATA:  Hepatic cirrhosis with abdominal distention EXAM: LIMITED ABDOMEN ULTRASOUND FOR ASCITES TECHNIQUE: Limited ultrasound survey for ascites was performed in all four abdominal quadrants. COMPARISON:  CT abdomen and pelvis December 01, 2014 FINDINGS: There is rather minimal ascites adjacent to the liver. Peristalsing bowel is seen elsewhere. The contour of the liver is nodular with increased liver echogenicity consistent with underlying hepatic cirrhosis. IMPRESSION: Rather minimal amount of ascites.  Hepatic cirrhosis. Electronically Signed   By: Bretta Bang III M.D.   On: 01/14/2016 10:31   Dg Chest Port 1 View  01/16/2016  CLINICAL DATA:  Status post dialysis catheter insertion EXAM: PORTABLE CHEST 1 VIEW COMPARISON:  01/13/2016 FINDINGS: Left chest wall dialysis catheter is noted with  tip in the projection of the right atria. No pneumothorax is identified. There is mild cardiac enlargement. Decreased lung volumes. No pleural effusion or edema. No airspace consolidation. IMPRESSION: 1. No complicating features identified after dialysis catheter insertion. 2. Resolution of pulmonary edema and right effusion. Electronically Signed   By: Signa Kell M.D.   On: 01/16/2016 16:25   Dg Fluoro Guide Cv Line-no Report  01/16/2016  CLINICAL DATA:  FLOURO GUIDE CV LINE Fluoroscopy was utilized by the requesting physician.  No radiographic interpretation.    Jeoffrey Massed, MD  [facility] Pager:[phone]  If 7PM-7AM, please contact night-coverage [email or web address] Password [email or web address] 01/17/2016, 12:16 PM   LOS: 4 days

## 2016-01-17 NOTE — Progress Notes (Signed)
Subjective:   Seen in HD for first HD treatment- s/p PC placed 4/9- was hypoxic upon arrival to HD unit- on non rebreather - now sats up- slow mentally - only 75 of urine recorded last 24 hours Objective Vital signs in last 24 hours: Filed Vitals:   01/17/16 0711 01/17/16 0730 01/17/16 0800 01/17/16 0830  BP: 101/54 100/38 106/38 99/43  Pulse: 74 76 74 74  Temp: 97.5 F (36.4 C)     TempSrc: Oral     Resp: 19 19    Height:      Weight:      SpO2:  92% 92% 92%   Weight change:   Intake/Output Summary (Last 24 hours) at 01/17/16 0854 Last data filed at 01/17/16 0557  Gross per 24 hour  Intake    750 ml  Output     80 ml  Net    670 ml    Assessment/ Plan: Pt is a 70 y.o. yo male who was admitted on 01/13/2016 with volume overload - thought due to cirrhosis and right sided heart failure- had fairly normal creatinine in Feb but progressive renal failure since- had to initiate HD today due to oligoanuria and climbing creatinine Assessment/Plan: 1. Renal- subacute picture of renal failure with most notably volume overload refractory to diuretics- req initiation of HD today.  U/S normal- he does have proteinuria at 3 grams but no hematuria- low plts but heme not susicious for TTP/HUS type picture.  Working diagnosis right now is hemodynamically mediated kidney injury.  Will watch UOP and labs closely- plan for HD tomorrow as well - hopefully he will recover renal function ?  2. HTN/vol- will stop coreg to give us a little more perfusion pressure  3. Anemia- hgb over 10 at the moment- iron is OK - no ESA yet 4. Hyperkalemia- drinking OJ- changed to 1 K bath today- will plan for HD tomorrow as well  5. Hypoxia- supposedly fairly clear CXR- cards to work up for possible PE     Maurice Paul A    Labs: Basic Metabolic Panel:  Recent Labs Lab 01/15/16 0345 01/16/16 0311 01/17/16 0237  NA 137 135 134*  K 5.4* 5.2* 6.4*  CL 100* 97* 96*  CO2 26 26 26   GLUCOSE 110* 111* 134*   BUN 31* 37* 43*  CREATININE 3.14* 4.36* 5.67*  CALCIUM 9.4 9.3 9.1  PHOS 5.0* 5.4* 7.5*   Liver Function Tests:  Recent Labs Lab 01/14/16 0025 01/14/16 0633 01/15/16 0345 01/16/16 0311 01/17/16 0237  AST 46* 41  --   --   --   ALT 24 23  --   --   --   ALKPHOS 64 56  --   --   --   BILITOT 2.1* 2.1*  --   --   --   PROT 8.2* 7.3  --   --   --   ALBUMIN 3.7 3.4* 3.4* 3.3* 3.7   No results for input(s): LIPASE, AMYLASE in the last 168 hours.  Recent Labs Lab 01/14/16 0020  AMMONIA 66*   CBC:  Recent Labs Lab 01/13/16 1824 01/14/16 [id number] 01/15/16 0345 01/16/16 0311 01/17/16 0237  WBC 7.4 6.9 6.8 6.7 8.2  NEUTROABS  --  5.2  --   --   --   HGB 11.0* 9.6* 9.7* 9.9* 10.3*  HCT 33.1* 30.3* 30.0* 28.9* 33.7*  MCV 106.1* 105.2* 105.6* 106.3* 110.1*  PLT 104* 104* 96* 105* 117*   Cardiac Enzymes:  Recent Labs  Lab 01/13/16 2236 01/14/16 0259 01/14/16 0633  TROPONINI 0.25* 0.30* 0.25*   CBG:  Recent Labs Lab 01/16/16 1156 01/16/16 1601 01/16/16 1833 01/16/16 2058 01/17/16 0534  GLUCAP 114* 108* 129* 134* 120*    Iron Studies:  Recent Labs  01/15/16 0345  IRON 91  TIBC 242*  FERRITIN 374*   Studies/Results: Dg Chest Port 1 View  01/16/2016  CLINICAL DATA:  Status post dialysis catheter insertion EXAM: PORTABLE CHEST 1 VIEW COMPARISON:  01/13/2016 FINDINGS: Left chest wall dialysis catheter is noted with tip in the projection of the right atria. No pneumothorax is identified. There is mild cardiac enlargement. Decreased lung volumes. No pleural effusion or edema. No airspace consolidation. IMPRESSION: 1. No complicating features identified after dialysis catheter insertion. 2. Resolution of pulmonary edema and right effusion. Electronically Signed   By: Signa Kell M.D.   On: 01/16/2016 16:25   Dg Fluoro Guide Cv Line-no Report  01/16/2016  CLINICAL DATA:  FLOURO GUIDE CV LINE Fluoroscopy was utilized by the requesting physician.  No radiographic  interpretation.   Medications: Infusions:    Scheduled Medications: . aspirin EC  325 mg Oral Daily  . carvedilol  3.125 mg Oral BID WC  .  ceFAZolin (ANCEF) IV  2 g Intravenous To SS-Surg  . enoxaparin (LOVENOX) injection  40 mg Subcutaneous Q24H  . insulin aspart  0-5 Units Subcutaneous QHS  . insulin aspart  0-9 Units Subcutaneous TID WC  . lactulose  10 g Oral BID  . pantoprazole  40 mg Oral Daily  . sodium chloride flush  3 mL Intravenous Q12H    have reviewed scheduled and prn medications.  Physical Exam: General: not really clear mentally  Heart: RRR Lungs: no rales Abdomen: obese Extremities: pitting edema Dialysis Access: left PC placed 4/9 Foley with bright red blood ?trauma    01/17/2016,8:54 AM  LOS: 4 days

## 2016-01-17 NOTE — Procedures (Signed)
Patient was seen on dialysis and the procedure was supervised.  BFR 200  Via PC BP is  99/43.   Patient appears to be tolerating treatment well  Maurice Paul A 01/17/2016

## 2016-01-17 NOTE — Consult Note (Signed)
PULMONARY / CRITICAL CARE MEDICINE   Name: Maurice Paul MRN: [id number] DOB: Apr 29, 1946    ADMISSION DATE:  01/13/2016 CONSULTATION DATE:  01/17/16  REFERRING MD:  Dr. 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    The patient reported on admission that he had gained 4-5 lbs a week for the month prior to admission.  He also had been treated for LE cellulitis with antibiotics and una-boots without significant relief (most recently has been treated with Keflex).  Patient also reported an obvious swelling in his lower extremities and abdominal distention.  Initial ER evaluation concerning for congestive heart failure, elevated troponin and acute kidney injury. Labs-NA 133, K5.2, CR 2.82, troponin 0.25, WBC 7.4, Hgb 11.0, MCV 106.1 and platelets 104. Initial chest x-ray with vascular congestion but no frank pulmonary edema, small bilateral pleural effusions. The patient was admitted per [facility] hospitalist for further evaluation.  The patient was evaluated by nephrology and felt to have subacute renal insufficiency in the setting of liver disease, ACE inhibitor use and right heart failure. He ultimately underwent placement of a PermCath per Dr. Edilia Bo on 4/9.  He also was evaluated by Dr. Donnie Aho of cardiology for right heart failure of unclear etiology (right heart failure vs cirrhosis vs OSA/OHS vs PE or combination).    Of note, the patient recently had an EGD completed by Dr. Ewing Schlein on 3/29 which showed evidence of gastritis but no other  abnormality.  He previously was treated with Lasix and Aldactone.  The patient was noted to have altered mental status a.m. of 4/10.  During dialysis he was treated with narcotics for pain. After return from HD to the medical floor he was noted to be more altered and an ABG was obtained which reflected hypercarbic respiratory failure (acute on chronic). PCCM consulted for evaluation.    Patient's wife denies known history of snoring or periods of apnea. 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  PAST MEDICAL HISTORY :  He  has a past medical history of PVD (peripheral vascular disease) (HCC); Tobacco chew use; Hypertension; SDH (subdural hematoma) (HCC) (03/2014); DVT (deep venous thrombosis) (HCC); Stasis dermatitis; Cirrhosis of liver (HCC); and Diabetes mellitus, type 2 (HCC).  PAST SURGICAL HISTORY: He  has past surgical history that includes Cyst removal trunk; Tonsillectomy; and Upper gi endoscopy (12/2015).  Allergies  Allergen Reactions  . Sulfa Antibiotics     Hives  . Onglyza [Saxagliptin] Rash    No current facility-administered medications on file prior to encounter.   Current Outpatient Prescriptions on File Prior to Encounter  Medication Sig  . lisinopril (PRINIVIL,ZESTRIL) 10 MG tablet Take 10 mg by mouth daily.    FAMILY HISTORY:  His indicated that his mother is deceased. He indicated that his father is deceased.   SOCIAL HISTORY: He  reports that he quit smoking about 17 years ago. He has quit using smokeless tobacco. His smokeless tobacco use included Chew. He reports that he drinks alcohol.  He reports that he does not use illicit drugs.  REVIEW OF SYSTEMS:  POSITIVES IN BOLD Gen: Denies fever, chills, weight change, fatigue, night sweats HEENT: Denies blurred vision, double vision, hearing loss, tinnitus, sinus congestion, rhinorrhea, sore throat, neck stiffness, dysphagia PULM: Denies shortness of breath, cough, sputum production, hemoptysis,  wheezing CV: Denies chest pain, edema, orthopnea, paroxysmal nocturnal dyspnea, palpitations GI: Denies abdominal pain, nausea, vomiting, diarrhea, hematochezia, melena, constipation, change in bowel habits GU: Denies dysuria, hematuria, polyuria, oliguria, urethral discharge Endocrine: Denies hot or cold intolerance, polyuria, polyphagia or appetite change Derm: Denies rash, dry skin, scaling or peeling skin change Heme: Denies easy bruising, bleeding, bleeding gums Neuro: Denies headache, numbness, weakness, slurred speech, loss of memory or consciousness  SUBJECTIVE:    VITAL SIGNS: BP 88/45 mmHg  Pulse 56  Temp(Src) 97.5 F (36.4 C) (Oral)  Resp 20  Ht  (1.778 m)  Wt 334 lb 3.5 oz (151.6 kg)  BMI 47.96 kg/m2  SpO2 96%  HEMODYNAMICS:    VENTILATOR SETTINGS:    INTAKE / OUTPUT: I/O last 3 completed shifts: In: 750 [P.O.:600; I.V.:150] Out: 130 [Urine:125; Blood:5]  PHYSICAL EXAMINATION: General:  Obese male lying in bed, NAD  Neuro:  Awakens to voice, drowsy, oriented to self / wife / place, generalized weakness, MAE HEENT:  MM pink/moist, unable to appreciate JVD, L perm cath with bruising at site Cardiovascular:  s1s2 rrr, no m/r/g, palpable peripheral pulses Lungs:  Even/non-labored, lungs bilaterally diminished  Abdomen:  Obese/soft, bsx4 hypoactive  Musculoskeletal:  No acute deformities  Skin:  Warm/dry, BLE edema, chronic venous stasis   LABS:  BMET  Recent Labs Lab 01/15/16 0345 01/16/16 0311 01/17/16 0237  NA 137 135 134*  K 5.4* 5.2* 6.4*  CL 100* 97* 96*  CO2 BUN 31* 37* 43*  CREATININE 3.14* 4.36* 5.67*  GLUCOSE 110* 111* 134*    Electrolytes  Recent Labs Lab 01/15/16 0345 01/16/16 0311 01/17/16 0237  CALCIUM 9.4 9.3 9.1  PHOS 5.0* 5.4* 7.5*    CBC  Recent Labs Lab 01/16/16 0311 01/17/16 0237 01/17/16 1423  WBC 6.7 8.2 18.2*  HGB 9.9* 10.3* 10.6*  HCT 28.9* 33.7* 33.5*  PLT 105* 117* 96*     Coag's  Recent Labs Lab 01/14/16 0025  INR 1.46    Sepsis Markers No results for input(s): LATICACIDVEN, PROCALCITON, O2SATVEN in the last 168 hours.  ABG  Recent Labs Lab 01/17/16 1435  PHART 7.153*  PCO2ART 79.0*  PO2ART 61.6*    Liver Enzymes  Recent Labs Lab 01/14/16 0025 01/14/16 0633 01/15/16 0345 01/16/16 0311 01/17/16 0237  AST 46* 41  --   --   --   ALT 24 23  --   --   --   ALKPHOS 64 56  --   --   --   BILITOT 2.1* 2.1*  --   --   --   ALBUMIN 3.7 3.4* 3.4* 3.3* 3.7    Cardiac Enzymes  Recent Labs Lab 01/13/16 2236 01/14/16 0259 01/14/16 0633  TROPONINI 0.25* 0.30* 0.25*    Glucose  Recent Labs Lab 01/16/16 1156 01/16/16 1601 01/16/16 1833 01/16/16 2058 01/17/16 0534 01/17/16 1227  GLUCAP 114* 108* 129* 134* 120* 150*    Imaging Dg Chest Port 1 View  01/17/2016  CLINICAL DATA:  Shortness of breath.  Altered mental status. EXAM: PORTABLE CHEST 1 VIEW COMPARISON:  01/16/2016 FINDINGS: Left jugular dialysis catheter terminates over the right atrium, unchanged. The cardiac  silhouette remains enlarged. Lung volumes are diminished, slightly more so than on the prior study. There is pulmonary vascular congestion with mild left greater than right basilar opacity. No sizable pleural effusion or pneumothorax is identified. IMPRESSION: Cardiomegaly and hypoinflation with pulmonary vascular congestion and mild bibasilar opacities which may reflect atelectasis and/or mild edema. Electronically Signed   By: Sebastian AcheAllen  Grady M.D.   On: 01/17/2016 13:17   Dg Chest Port 1 View  01/16/2016  CLINICAL DATA:  Status post dialysis catheter insertion EXAM: PORTABLE CHEST 1 VIEW COMPARISON:  01/13/2016 FINDINGS: Left chest wall dialysis catheter is noted with tip in the projection of the right atria. No pneumothorax is identified. There is mild cardiac enlargement. Decreased lung volumes. No pleural effusion or edema. No airspace consolidation. IMPRESSION: 1. No  complicating features identified after dialysis catheter insertion. 2. Resolution of pulmonary edema and right effusion. Electronically Signed   By: Signa Kellaylor  Stroud M.D.   On: 01/16/2016 16:25   Dg Fluoro Guide Cv Line-no Report  01/16/2016  CLINICAL DATA:  FLOURO GUIDE CV LINE Fluoroscopy was utilized by the requesting physician.  No radiographic interpretation.     STUDIES:  4/07  LE Doppler >> no evidence of DVT bilaterally 4/07  ABD US >> normal amount of ascites, hepatic cirrhosis 4/07  Renal US >> small right renal cyst, otherwise kidneys unremarkable 4/08  ECHO >> EF 60-65%, normal wall motion, mild systolic flattening of ventricular septum c/w RV pressure overload, mild MR, LA moderately dilated, RV mildly dilated, RA mod to severely dilated, moderate TR, PA Peak 45 mm Hg  CULTURES: BCx2 4/10 >>  UA 4/10 >>  UC 4/10 >>  Sputum 4/10 >>   ANTIBIOTICS: Vanco 4/10 >>  Zosyn 4/10 >>   SIGNIFICANT EVENTS: 4/06  Admit with weight gain, swelling   LINES/TUBES:   DISCUSSION: 70 year old male admitted 4/6 with dyspnea and weight gain. Found to have subacute renal failure & CHF - ? In setting of liver disease, ? Decompensated cor pulmonale.  He underwent placement of a PermCath on 4/9 (local + sedation).  During dialysis (2 L removed) on 4/10 he also received narcotics for pain.  ? If acute mental status changes are related to medications in the setting of poor renal/hepatic clearance.   He later developed nausea and vomiting. Concern for possible aspiration but no overt infiltrate post vomiting on CXR.   ASSESSMENT / PLAN:  PULMONARY A: Acute on chronic hypercarbic respiratory failure - suspect element of OHS/OSA Pulmonary hypertension - PA peak 45 on echo, large right heart border noted on chest x-ray ? Decompensated cor pulmonale Concern for aspiration - in setting of nausea/vomiting ? OSA / OHS P:   Oxygen to support saturations > 90% No BiPAP with history of vomiting If  patient decompensates will need intubation Pulmonary hygiene-IS, mobilize as able Monitor serial CXR's with history of vomiting VQ scan pending, lower extremity dopplers negative Will need outpatient sleep study  CARDIOVASCULAR A:  Hypertension PVD / chronic venous stasis P:  Transfer to ICU for monitoring Cardiology following, appreciate input Fluid balance per nephrology Hold home antihypertensives  RENAL A:   Acute Renal Failure - noted approximately 2 months prior to admission, on ACE, renal ultrasound negative for hydronephrosis.  Oliguria with hyperkalemia, HD initiated 4/10 Hyperkalemia P:   Hemodialysis per nephrology Trend BMP / UOP Replace electrolytes as indicated PermCath care  GASTROINTESTINAL A:   Morbid obesity Nausea / vomiting  P:   Nothing by mouth  Place OGT  for bowel rest / decompression  See ID If lactic acid elevated, consider CT abdomen evaluation  HEMATOLOGIC A:   Macrocytic anemia  Thrombocytopenia  Leukocytosis  R/O PE - unable to complete CTA due to CAD, lower extremity doppler negative P:  Trend CBC, platelets Monitor for bleeding  Empiric Heparin drip per pharmacy  INFECTIOUS A:   R/O SBP  P:   Repeat labs now - CBC, BMP, lactic acid Assess cultures as above  Empiric vancomycin and Zosyn  Monitor fever curve / WBC   ENDOCRINE A:   Diabetes  P:   CBG every 4 with sliding scale insulin   NEUROLOGIC A:   Acute encephalopathy - suspect metabolic with narcotics plus acute on chronic kidney disease.  Sedation for PermCath on 4/9, ? Clearance  P:   RASS goal: n/a Minimize sedating medications as able When necessary low-dose fentanyl for pain   FAMILY  - Updates: Wife updated at bedside 4/10 per Dr. Jerral Ralph & Dr. Isaiah Serge  - Inter-disciplinary family meet or Palliative Care meeting due by: 4/17     Canary Brim, NP-C [facility] Pgr: [phone] or if no answer [phone] 01/17/2016, 3:12 PM

## 2016-01-17 NOTE — Progress Notes (Signed)
Pharmacy Antibiotic Note  Maurice Paul is a 70 y.o. male admitted on 01/13/2016 with pneumonia.  Pharmacy has been consulted for Vancomycin/Zosyn dosing in CRRT  History of cryptogenic liver cirrhosis (refused biopsy), HTN, type 2 diabetes who presented with worsening renal failure and anasarca. Anasarca thought to be secondary to Hospital course has been complicated by development of an anuria/oliguric renal failure requiring dialysis (CRRT started 4/10 evening).  Plan: 1. Give additional vancomycin 500 mg IV to complete loading dose (total of 2500 mg load), followed by  2. Vancomycin 1500 IV q24 hours while on CRRT.  Goal trough 15-20 mcg/mL. 3. Zosyn 2.25 grams q 6 hours while on CRRT 4. Monitor clinical progress, c/s, renal fx, abx plan/LOT, VT@SS  as indicated    Height: 5\' 10"  (177.8 cm) Weight: (!) 334 lb 3.5 oz (151.6 kg) IBW/kg (Calculated) : 73  Temp (24hrs), Avg:97.6 F (36.4 C), Min:97.5 F (36.4 C), Max:97.8 F (36.6 C)   Recent Labs Lab 01/15/16 0345 01/16/16 0311 01/17/16 0237 01/17/16 1423 01/17/16 1819 01/17/16 1838  WBC 6.8 6.7 8.2 18.2*  --  16.3*  CREATININE 3.14* 4.36* 5.67* 5.71*  --  5.65*  LATICACIDVEN  --   --   --  1.9 2.1*  --     Estimated Creatinine Clearance: 17.7 mL/min (by C-G formula based on Cr of 5.65).    Allergies  Allergen Reactions  . Sulfa Antibiotics     Hives  . Onglyza [Saxagliptin] Rash    Antimicrobials this admission: 4/10 Vanco>> 4/10 Zosyn>>  Dose adjustments this admission: Adjusting starting regimens for CRRT  Microbiology results: 4/10 BCx: sent 4/10 UCx: sent  4/9 MRSA PCR: neg   Thank you for allowing us to participate in this patients care. Signe Coltonya C Ashlei Chinchilla, PharmD Pager: [phone] 01/17/2016 9:05 PM

## 2016-01-17 NOTE — Progress Notes (Addendum)
CRITICAL VALUE ALERT  Critical value received:  Potassium 6.2  Date of notification:  01/17/16   Time of notification:  1330  Critical value read back: yes  Nurse who received alert:  Jilda PandaBethany Beza Steppe  MD notified (1st page):  Dr. Isaiah SergeMannam  Time of first page:  Notified in person at [phone]

## 2016-01-17 NOTE — Progress Notes (Signed)
Pt belongings at bedside include glasses and upper and lower dentures.

## 2016-01-17 NOTE — Procedures (Signed)
Central Venous Catheter Insertion Procedure Note Maurice BaizeWalter S Paul [id number] 07/31/1946  Procedure: Insertion of Central Venous Catheter Indications: Assessment of intravascular volume, Drug and/or fluid administration and Frequent blood sampling  Procedure Details Consent: Risks of procedure as well as the alternatives and risks of each were explained to the (patient/caregiver).  Consent for procedure obtained. Time Out: Verified patient identification, verified procedure, site/side was marked, verified correct patient position, special equipment/implants available, medications/allergies/relevent history reviewed, required imaging and test results available.  Performed  Maximum sterile technique was used including antiseptics, cap, gloves, gown, hand hygiene, mask and sheet. Skin prep: Chlorhexidine; local anesthetic administered A antimicrobial bonded/coated triple lumen catheter was placed in the right internal jugular vein using the Seldinger technique. Ultrasound guidance used.Yes.   Catheter placed to 17 cm. Blood aspirated via all 3 ports and then flushed x 3. Line sutured x 2 and dressing applied.  Evaluation Blood flow good Complications: No apparent complications Patient did tolerate procedure well. Chest X-ray ordered to verify placement.  CXR: pending.  Joneen RoachPaul Delia Slatten, AGACNP-BC [facility] Pager [phone] or [phone]  01/17/2016 6:56 PM

## 2016-01-17 NOTE — Progress Notes (Signed)
1000 Pt arrived back from HD. Pt appears lethargic, answers questions appropriately, sitting at bedside. Vitals taken.  1045 Making rounds on pt. Pt laying in bed resting comfortably.  1130 pt wife arrives and at bedside. Pt laying in bed, still lethargic. Elevated Ddimer result noted. Helle Rapid Response RN called regarding concerns for pt. Rapid Response Helle at bedside to evaluate pt. Narcan given. 1230 Dr. Jerral RalphGhimire paged regarding pt vitals, hypotensive. Pt still lethargic and oxygen saturations in low. New orders given 250 NS bolus given. Dr. Jerral RalphGhimire at bedside to evaluate pt. Transfer orders written. Will continue to monitor.

## 2016-01-17 NOTE — Progress Notes (Signed)
Subjective:  Seen today in dialysis.  Has oliguric renal failure.  Somewhat sleepy and lethargic at present. Having low oxygen sates.    Objective:  Vital Signs in the last 24 hours: BP 99/43 mmHg  Pulse 74  Temp(Src) 97.5 F (36.4 C) (Oral)  Resp 19  Ht 5\' 10"  (1.778 m)  Wt 153.6 kg (338 lb 10 oz)  BMI 48.59 kg/m2  SpO2 92%  Physical Exam: Morbidly obese white male currently in no acute distress, lethargic Lungs:  Clear  Cardiac:  Regular rhythm, normal S1 and S2, no S3, no murmur Abdomen: Quite distended Extremities:  4+ edema noted with brawny erythema extending all the way up the lower extremities  Intake/Output from previous day: 04/09 0701 - 04/10 0700 In: 750 [P.O.:600; I.V.:150] Out: 80 [Urine:75; Blood:5] Weight Filed Weights   01/15/16 0542 01/16/16 0538 01/17/16 0709  Weight: 151.456 kg (333 lb 14.4 oz) 151.864 kg (334 lb 12.8 oz) 153.6 kg (338 lb 10 oz)    Lab Results: Basic Metabolic Panel:  Recent Labs  78/29/5603/06/26 0311 01/17/16 0237  NA 135 134*  K 5.2* 6.4*  CL 97* 96*  CO2 26 26  GLUCOSE 111* 134*  BUN 37* 43*  CREATININE 4.36* 5.67*    CBC:  Recent Labs  01/16/16 0311 01/17/16 0237  WBC 6.7 8.2  HGB 9.9* 10.3*  HCT 28.9* 33.7*  MCV 106.3* 110.1*  PLT 105* 117*   PROTIME: Lab Results  Component Value Date   INR 1.46 01/14/2016   INR 1.10 12/24/2013    Telemetry: Normal sinus rhythm  Assessment/Plan:  1.  Marked lower extremity edema that is likely multifactorial.  At this point unclear whether it is due to right heart failure, cirrhosis, or significant chronic venous stasis or lymphedema. It is likely a combination of all of them.  2.  Prior diagnosis of cirrhosis questionable etiology but evidently has refused liver biopsy 3. Subacute renal failure progressive now on dialysis. 4.  Right heart failure unclear etiology.  Unclear whether this is due to morbid obesity, subacute pulmonary emboli, or sleep apnea.  Getting at this  diagnosis will be difficult.   5.  Thrombocytopenia, macrocytosis, mildly low albumin and evidence of cirrhosis of which could be markers of cirrhosis 7.  History of DVT previously at the time of subarachnoid hemorrhage 8.  History of traumatic subdural hemorrhage  Recommendations:  He is dialyzing at present but with low sats.  Discussed with Dr. Kathrene BongoGoldsborough.  Hesitant to use contrast to look for PE with acute renal failure.  A V/Q scan might be helpful. But may be difficult to interpret due to his size.   Darden PalmerW. Spencer Alvine Mostafa, Jr.  MD [facility]  01/17/2016, 9:04 AM

## 2016-01-17 NOTE — Progress Notes (Signed)
PCCM INTERVAL PROGRESS NOTE  Mr Maurice Paul has been transferred to ICU for further management of respiratory failure today. He has had worsening lethargy and hypotension since that time. Patient was much more awake earlier. While he still arouses to verbal, his responses are less oriented. Hyperkalemia also continued with K 6.2 despite HD earlier today.  He has been seen by Dr. Kathrene BongoGoldsborough who recommends CRRT at this time. Although concern with borderline low BP.   Plan: - Updated wife Maurice Paul, will place CVL and start pressors - Remains an intubation risk, will check ABG to assess adequacy of ventilation.   - CRRT per nephrology  Additional critical care time described in this note = 25 mins  Maurice Paul, AGACNP-BC [facility] Pager [phone] or [phone]  01/17/2016 6:20 PM

## 2016-01-17 NOTE — Progress Notes (Signed)
Critical ABG values given to P. Hoffman, NP. 

## 2016-01-17 NOTE — Progress Notes (Signed)
Dialysis treatment completed.  2500 mL ultrafiltrated.  2000 mL net fluid removal.  Patient status unchanged. Lung sounds diminished to ausculation in all fields. Generalized edema. Cardiac: NSR.  Cleansed LIJ catheter with chlorhexidine.  Disconnected lines and flushed ports with saline per protocol.  Ports locked with heparin and capped per protocol.    Report given to bedside, RN Bethany.

## 2016-01-17 NOTE — Progress Notes (Signed)
Remains encephalopathic, hypoxic and hypotensive after HD He easily awakes-but if left alone-drowses off back to sleep-no response to narcan Vomited- 2-3 times today-last only 20 minutes of so agon  Suspect that he has aspiration PNA ABG shows hypercarbia WBC increased to 18 k Awaiting lytes,abd xray  Hesitant to use BIPAP given vomting  Given concern for chronic PE was scheduled for a VQ Scan-unable to complete as unstable at present  For now Empiric IV Heparin Empiric Vanco/Zosyn Hold off moving to SDU till seen by PCCM Await PCCM recommendations Will follow Discussed with spouse at bedside

## 2016-01-17 NOTE — Progress Notes (Signed)
PT Cancellation Note  Patient Details Name: Maurice BaizeWalter S Hinderliter MRN: [id number] DOB: 07/09/1946   Cancelled Treatment:    Reason Eval/Treat Not Completed: Patient at procedure or test/unavailable. Pt at HD. PT to return as able.   [name], [name] 01/17/2016, 7:56 AM   Lewis ShockAshly Kendal Ghazarian, PT, DPT Pager #: [phone] Office #: [phone]

## 2016-01-17 NOTE — Significant Event (Signed)
Rapid Response Event Note  Overview: Time Called: 1231 Arrival Time: 1235 Event Type: Neurologic, Respiratory  Initial Focused Assessment: RN called because patient is more lethargic than he was yesterday. Recently finished his 1 st dialysis treatment. Yesterday he had a HD Catheter placed in the OR BP 86/34 SR 65  RR 16 O2 sat 99% on 2l [place] Lung sounds decreased in the bases.  Interventions: After a little stimulation he was awake and conversant but still alittle delirious. 1 dose of narcan given IV, no change in mental status Labs ordered. Will continue to monitor 1400  Patient alseep, and again difficult to arouse.  After verbal stimulation able to converse but again a little delirious Dr Jerral RalphGhimire at bedside to assess patient BP 88/45  250cc NS bolus Patient vomiting bile ABG done Ph 7.15 pCO2 79 P02 61 bicarb 26 Dr Isaiah SergeMannam at bedside to assess patient NG tube placed: 125 cc green/brown bile BP 70/50 in left arm 83/50 in right arm, no change in neuro status, responds to verbal stim desat to 77% when he fell asleep O2 sats improved to 95% when woken. Pt transported to 2M05 via bed with O2 via [place]  Event Summary: Name of Physician Notified: Ghimire at [id number]  Name of Consulting Physician Notified: CCM/ Mannam at 1500  Outcome: Transferred (Comment)  Event End Time: 1700  Marcellina MillinLayton, Maurice Paul

## 2016-01-17 NOTE — Procedures (Signed)
Arterial Catheter Insertion Procedure Note Jewel BaizeWalter S Sharman [id number] 08/04/1946  Procedure: Insertion of Arterial Catheter  Indications: Blood pressure monitoring  Procedure Details Consent: Unable to obtain consent because of altered level of consciousness. Time Out: Verified patient identification, verified procedure, site/side was marked, verified correct patient position, special equipment/implants available, medications/allergies/relevent history reviewed, required imaging and test results available.  Performed  Maximum sterile technique was used including antiseptics, cap, gloves, gown, hand hygiene, mask and sheet. Skin prep: Chlorhexidine; local anesthetic administered 20 gauge catheter was inserted into right radial artery using the Seldinger technique.  Evaluation Blood flow good; BP tracing good. Complications: No apparent complications.   Adela PortsBrown, Reene Harlacher Henry 01/17/2016

## 2016-01-18 ENCOUNTER — Encounter (HOSPITAL_COMMUNITY): Payer: Self-pay | Admitting: Vascular Surgery

## 2016-01-18 DIAGNOSIS — R6521 Severe sepsis with septic shock: Secondary | ICD-10-CM

## 2016-01-18 DIAGNOSIS — K7460 Unspecified cirrhosis of liver: Secondary | ICD-10-CM

## 2016-01-18 DIAGNOSIS — A419 Sepsis, unspecified organism: Secondary | ICD-10-CM

## 2016-01-18 LAB — HEPATITIS B CORE ANTIBODY, TOTAL: Hep B Core Total Ab: NEGATIVE

## 2016-01-18 LAB — CBC
HCT: 33.4 % — ABNORMAL LOW (ref 39.0–52.0)
Hemoglobin: 10.8 g/dL — ABNORMAL LOW (ref 13.0–17.0)
MCH: 35.3 pg — ABNORMAL HIGH (ref 26.0–34.0)
MCHC: 32.3 g/dL (ref 30.0–36.0)
MCV: 109.2 fL — ABNORMAL HIGH (ref 78.0–100.0)
Platelets: 151 10*3/uL (ref 150–400)
RBC: 3.06 MIL/uL — ABNORMAL LOW (ref 4.22–5.81)
RDW: 15.9 % — AB (ref 11.5–15.5)
WBC: 17.8 10*3/uL — ABNORMAL HIGH (ref 4.0–10.5)

## 2016-01-18 LAB — POCT ACTIVATED CLOTTING TIME
ACTIVATED CLOTTING TIME: 162 s
ACTIVATED CLOTTING TIME: 173 s
ACTIVATED CLOTTING TIME: 178 s
ACTIVATED CLOTTING TIME: 188 s
ACTIVATED CLOTTING TIME: 188 s
ACTIVATED CLOTTING TIME: 193 s
ACTIVATED CLOTTING TIME: 178 s
ACTIVATED CLOTTING TIME: 167 s
ACTIVATED CLOTTING TIME: 178 s
Activated Clotting Time: 147 seconds
Activated Clotting Time: 167 seconds
Activated Clotting Time: 178 seconds
Activated Clotting Time: 178 seconds
Activated Clotting Time: 183 seconds
Activated Clotting Time: 183 seconds
Activated Clotting Time: 255 seconds
Activated Clotting Time: 188 seconds

## 2016-01-18 LAB — RENAL FUNCTION PANEL
ANION GAP: 14 (ref 5–15)
Albumin: 3.8 g/dL (ref 3.5–5.0)
Albumin: 3.8 g/dL (ref 3.5–5.0)
Anion gap: 15 (ref 5–15)
BUN: 39 mg/dL — AB (ref 6–20)
BUN: 36 mg/dL — AB (ref 6–20)
CALCIUM: 8.7 mg/dL — AB (ref 8.9–10.3)
CHLORIDE: 98 mmol/L — AB (ref 101–111)
CHLORIDE: 96 mmol/L — AB (ref 101–111)
CO2: 22 mmol/L (ref 22–32)
CO2: 24 mmol/L (ref 22–32)
CREATININE: 5.04 mg/dL — AB (ref 0.61–1.24)
Calcium: 8.5 mg/dL — ABNORMAL LOW (ref 8.9–10.3)
Creatinine, Ser: 4.74 mg/dL — ABNORMAL HIGH (ref 0.61–1.24)
GFR calc Af Amer: 12 mL/min — ABNORMAL LOW (ref >60)
GFR calc Af Amer: 13 mL/min — ABNORMAL LOW (ref >60)
GFR calc non Af Amer: 10 mL/min — ABNORMAL LOW (ref >60)
GFR, EST NON AFRICAN AMERICAN: 11 mL/min — AB (ref >60)
GLUCOSE: 162 mg/dL — AB (ref 65–99)
GLUCOSE: 171 mg/dL — AB (ref 65–99)
POTASSIUM: 5.8 mmol/L — AB (ref 3.5–5.1)
Phosphorus: 6.9 mg/dL — ABNORMAL HIGH (ref 2.5–4.6)
Phosphorus: 6.3 mg/dL — ABNORMAL HIGH (ref 2.5–4.6)
Potassium: 6.6 mmol/L (ref 3.5–5.1)
SODIUM: 135 mmol/L (ref 135–145)
Sodium: 134 mmol/L — ABNORMAL LOW (ref 135–145)

## 2016-01-18 LAB — GLUCOSE, CAPILLARY
GLUCOSE-CAPILLARY: 131 mg/dL — AB (ref 65–99)
GLUCOSE-CAPILLARY: 162 mg/dL — AB (ref 65–99)
Glucose-Capillary: 155 mg/dL — ABNORMAL HIGH (ref 65–99)
Glucose-Capillary: 148 mg/dL — ABNORMAL HIGH (ref 65–99)
Glucose-Capillary: 171 mg/dL — ABNORMAL HIGH (ref 65–99)
Glucose-Capillary: 179 mg/dL — ABNORMAL HIGH (ref 65–99)

## 2016-01-18 LAB — MAGNESIUM: Magnesium: 2.4 mg/dL (ref 1.7–2.4)

## 2016-01-18 LAB — HEPATITIS B SURFACE ANTIBODY,QUALITATIVE: HEP B S AB: NONREACTIVE

## 2016-01-18 LAB — POTASSIUM: Potassium: 5.9 mmol/L — ABNORMAL HIGH (ref 3.5–5.1)

## 2016-01-18 MED ORDER — IPRATROPIUM-ALBUTEROL 0.5-2.5 (3) MG/3ML IN SOLN
3.0000 mL | Freq: Four times a day (QID) | RESPIRATORY_TRACT | Status: DC
  Administered 2016-01-18 – 2016-01-25 (×26): 3 mL via RESPIRATORY_TRACT
  Filled 2016-01-18 (×26): qty 3

## 2016-01-18 MED ORDER — LACTULOSE 10 GM/15ML PO SOLN
20.0000 g | Freq: Two times a day (BID) | ORAL | Status: DC
  Administered 2016-01-18 – 2016-01-19 (×3): 20 g
  Filled 2016-01-18 (×3): qty 30

## 2016-01-18 MED ORDER — POTASSIUM CHLORIDE 2 MEQ/ML IV SOLN
INTRAVENOUS | Status: DC
  Administered 2016-01-18 (×2): via INTRAVENOUS_CENTRAL
  Filled 2016-01-18 (×7): qty 5000

## 2016-01-18 MED ORDER — HYDROCORTISONE NA SUCCINATE PF 100 MG IJ SOLR
50.0000 mg | Freq: Four times a day (QID) | INTRAMUSCULAR | Status: DC
  Administered 2016-01-18 – 2016-01-27 (×36): 50 mg via INTRAVENOUS
  Filled 2016-01-18 (×15): qty 1
  Filled 2016-01-18: qty 2
  Filled 2016-01-18 (×21): qty 1

## 2016-01-18 MED ORDER — PRISMASOL BGK 0/2.5 32-2.5 MEQ/L IV SOLN
INTRAVENOUS | Status: DC
  Administered 2016-01-18 – 2016-01-20 (×12): via INTRAVENOUS_CENTRAL
  Filled 2016-01-18 (×16): qty 5000

## 2016-01-18 NOTE — Progress Notes (Signed)
Subjective:  Events of yesterday reviewed.  Hypotension lethargy noted.  D dimer elevated.  Moved to unit. Currently on dialysis at bedside. Some emesis yesterday, none today    Objective:  Vital Signs in the last 24 hours: BP 123/35 mmHg  Pulse 79  Temp(Src) 97.8 F (36.6 C) (Oral)  Resp 15  Ht 5\' 10"  (1.778 m)  Wt 155 kg (341 lb 11.4 oz)  BMI 49.03 kg/m2  SpO2 92%  Physical Exam: Morbidly obese white male currently in no acute distress, more wake than yesterday Lungs:  Reduced BS  Cardiac:  Regular rhythm, normal S1 and S2, no S3, no murmur Abdomen: Quite distended Extremities:  4+ edema noted with brawny erythema extending all the way up the lower extremities  Intake/Output from previous day: 04/10 0701 - 04/11 0700 In: 617.5 [I.V.:517.5; IV Piggyback:100] Out: 2853 [Urine:25] Weight Filed Weights   01/17/16 0941 01/17/16 1800 01/18/16 0500  Weight: 151.6 kg (334 lb 3.5 oz) 153.9 kg (339 lb 4.6 oz) 155 kg (341 lb 11.4 oz)    Lab Results: Basic Metabolic Panel:  Recent Labs  16/07/9603/10/17 1838 01/18/16 0550 01/18/16 0744  NA 138 135  --   K 6.0* 6.6* 5.9*  CL 99* 98*  --   CO2 26 22  --   GLUCOSE 135* 162*  --   BUN 40* 39*  --   CREATININE 5.65* 5.04*  --     CBC:  Recent Labs  01/17/16 1838 01/18/16 0550  WBC 16.3* 17.8*  NEUTROABS 14.5*  --   HGB 10.7* 10.8*  HCT 33.9* 33.4*  MCV 111.9* 109.2*  PLT 91* 151   PROTIME: Lab Results  Component Value Date   INR 1.57* 01/17/2016   INR 1.46 01/14/2016   INR 1.10 12/24/2013    Telemetry: Normal sinus rhythm  Assessment/Plan:  1.  Right heart failure and hypotension with pul hypertension.  D dimer is up and wonder about PE as a cause.  2.  Prior diagnosis of cirrhosis questionable etiology but evidently has refused liver biopsy 3. Subacute renal failure progressive now on dialysis. 4.  Right heart failure unclear etiology.  Unclear whether this is due to morbid obesity, subacute pulmonary emboli,  or sleep apnea.  Could have PE with d dimer up. 5.  Thrombocytopenia, macrocytosis, mildly low albumin and evidence of cirrhosis of which could be markers of cirrhosis 7.  History of DVT previously at the time of subarachnoid hemorrhage 8.  History of traumatic subdural hemorrhage  Recommendations:  Empiric anticoagulation and treatment of other comorbid conditions.  Seriously ill at this time.    Darden PalmerW. Spencer Tilley, Jr.  MD [facility]  01/18/2016, 8:45 AM

## 2016-01-18 NOTE — Progress Notes (Signed)
Called Renal and made them aware pts afternoon K is 5.8 despite earlier changed in Dialysate. New orders received for 0/2.5 bags. Will continue to monitor.

## 2016-01-18 NOTE — Progress Notes (Signed)
During pts bath A-line became dislodged. A-line removed and pressure held. Dressing placed. MD notified. No new orders to reinsert a-line. Will continue to monitor.

## 2016-01-18 NOTE — Progress Notes (Signed)
Critical: Potassium 6.6  Eddie NorthJennifer Meagen Limones, RN  Notified, Dr. Lynda RainwaterGoldsborogh    Repulled sample as concern for hemolysis.

## 2016-01-18 NOTE — Progress Notes (Signed)
PULMONARY / CRITICAL CARE MEDICINE   Name: Maurice Paul MRN: [id number] DOB: 12-04-1945    ADMISSION DATE:  01/13/2016 CONSULTATION DATE:  01/17/16  REFERRING MD:  Dr. 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    The patient reported on admission that he had gained 4-5 lbs a week for the month prior to admission.  He also had been treated for LE cellulitis with antibiotics and una-boots without significant relief (most recently has been treated with Keflex).  Patient also reported an obvious swelling in his lower extremities and abdominal distention.  Initial ER evaluation concerning for congestive heart failure, elevated troponin and acute kidney injury. Labs-NA 133, K5.2, CR 2.82, troponin 0.25, WBC 7.4, Hgb 11.0, MCV 106.1 and platelets 104. Initial chest x-ray with vascular congestion but no frank pulmonary edema, small bilateral pleural effusions. The patient was admitted per [facility] hospitalist for further evaluation.  The patient was evaluated by nephrology and felt to have subacute renal insufficiency in the setting of liver disease, ACE inhibitor use and right heart failure. He ultimately underwent placement of a PermCath per Dr. Edilia Bo on 4/9.  He also was evaluated by Dr. Donnie Aho of cardiology for right heart failure of unclear etiology (right heart failure vs cirrhosis vs OSA/OHS vs PE or combination).    Of note, the patient recently had an EGD completed by Dr. Ewing Schlein on 3/29 which showed evidence of gastritis but no other  abnormality.  He previously was treated with Lasix and Aldactone.  The patient was noted to have altered mental status a.m. of 4/10.  During dialysis he was treated with narcotics for pain. After return from HD to the medical floor he was noted to be more altered and an ABG was obtained which reflected hypercarbic respiratory failure (acute on chronic). PCCM consulted for evaluation.    Patient's wife denies known history of snoring or periods of apnea. 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  SUBJECTIVE:  Remains on pressors CVVHD running A bit more awake this am   VITAL SIGNS: BP 100/40 mmHg  Pulse 74  Temp(Src) 97.8 F (36.6 C) (Oral)  Resp 13  Ht  (1.778 m)  Wt 155 kg (341 lb 11.4 oz)  BMI 49.03 kg/m2  SpO2 94%  HEMODYNAMICS:    VENTILATOR SETTINGS:    INTAKE / OUTPUT: I/O last 3 completed shifts: In: 1097.5 [P.O.:480; I.V.:517.5; IV Piggyback:100] Out: 2853 [Urine:25; Other:2828]  PHYSICAL EXAMINATION: General:  Obese male lying in bed, NAD  Neuro:  Awakens to voice, drowsy, oriented to self / wife / place, generalized weakness, MAE HEENT:  MM pink/moist, unable to appreciate JVD, L perm cath with bruising at site Cardiovascular:  s1s2 rrr, no m/r/g, palpable peripheral pulses Lungs:  Even/non-labored, lungs bilaterally diminished  Abdomen:  Obese/soft, bsx4 hypoactive  Musculoskeletal:  No acute deformities  Skin:  Warm/dry, BLE edema, chronic venous stasis   LABS:  BMET  Recent Labs Lab 01/17/16 1423 01/17/16 1838 01/18/16  0550 01/18/16 0744  NA 135 138 135  --   K 6.2* 6.0* 6.6* 5.9*  CL 96* 99* 98*  --   CO2 27 26 22   --   BUN 40* 40* 39*  --   CREATININE 5.71* 5.65* 5.04*  --   GLUCOSE 148* 135* 162*  --     Electrolytes  Recent Labs Lab 01/17/16 1423 01/17/16 1819 01/17/16 1838 01/18/16 0550  CALCIUM 8.8*  --  9.0 8.7*  MG  --   --   --  2.4  PHOS 8.2* 8.2*  --  6.9*    CBC  Recent Labs Lab  01/17/16 1423 01/17/16 1838 01/18/16 0550  WBC 18.2* 16.3* 17.8*  HGB 10.6* 10.7* 10.8*  HCT 33.5* 33.9* 33.4*  PLT 96* 91* 151    Coag's  Recent Labs Lab 01/14/16 0025 01/17/16 2320  INR 1.46 1.57*    Sepsis Markers  Recent Labs Lab 01/17/16 1423 01/17/16 1819  LATICACIDVEN 1.9 2.1*  PROCALCITON 0.36  --     ABG  Recent Labs Lab 01/17/16 1435 01/17/16 1831  PHART 7.153* 7.177*  PCO2ART 79.0* 75.3*  PO2ART 61.6* 77.0*    Liver Enzymes  Recent Labs Lab 01/14/16 0025 01/14/16 0633  01/17/16 1423 01/17/16 1838 01/18/16 0550  AST 46* 41  --   --  41  --   ALT 24 23  --   --  22  --   ALKPHOS 64 56  --   --  57  --   BILITOT 2.1* 2.1*  --   --  1.5*  --   ALBUMIN 3.7 3.4*  < > 3.7 3.5 3.8  < > = values in this interval not displayed.  Cardiac Enzymes  Recent Labs Lab 01/14/16 0259 01/14/16 0633 01/17/16 1838  TROPONINI 0.30* 0.25* 0.29*    Glucose  Recent Labs Lab 01/17/16 1227 01/17/16 1638 01/17/16 2018 01/17/16 2329 01/18/16 0745 01/18/16 1151  GLUCAP 150* 115* 129* 131* 162* 155*    Imaging Dg Chest Port 1 View  01/17/2016  CLINICAL DATA:  Central catheter placement EXAM: PORTABLE CHEST 1 VIEW COMPARISON:  Study obtained earlier in the day FINDINGS: There is a new right jugular catheter with the tip near the junction of the right jugular vein and superior vena cava. The left cyst dual-lumen central catheter tip is at the cavoatrial junction. Nasogastric tube tip and side port are below the diaphragm. Note that a portion of the lateral right hemithorax is not visualized. No pneumothorax is appreciable in visualized regions. There is no edema or consolidation in visualized regions. There is stable mild atelectasis in left base. Heart is mildly enlarged with pulmonary vascularity within normal limits. No adenopathy. IMPRESSION: Note that a portion of the lateral right hemi thorax is not visualized. The new right jugular catheter tip is near  the junction of the right jugular vein and superior vena cava. No pneumothorax evident in the regions which are visualized. Other tubes and catheters as described. Mild left base atelectasis. Visualized lungs elsewhere clear. Stable cardiomegaly. Electronically Signed   By: Bretta BangWilliam  Woodruff III M.D.   On: 01/17/2016 19:14   Dg Abd Portable 2v  01/17/2016  CLINICAL DATA:  Vomiting EXAM: PORTABLE ABDOMEN - 2 VIEW COMPARISON:  CT abdomen 12/01/2014 FINDINGS: Image quality is suboptimal due to large patient size Normal bowel gas pattern. Negative for obstruction or ileus. Limited decubitus evaluation without definite free air. Central venous catheter tip in the right atrium. IMPRESSION: Limited study  due to patient size.  No acute abnormality. Electronically Signed   By: Marlan Palau M.D.   On: 01/17/2016 15:21     STUDIES:  4/07  LE Doppler >> no evidence of DVT bilaterally 4/07  ABD [place] >> normal amount of ascites, hepatic cirrhosis 4/07  Renal US >> small right renal cyst, otherwise kidneys unremarkable 4/08  ECHO >> EF 60-65%, normal wall motion, mild systolic flattening of ventricular septum c/w RV pressure overload, mild MR, LA moderately dilated, RV mildly dilated, RA mod to severely dilated, moderate TR, PA Peak 45 mm Hg  CULTURES: BCx2 4/10 >>  UA 4/10 >>  UC 4/10 >>  Sputum 4/10 >>   ANTIBIOTICS: Vanco 4/10 >>  Zosyn 4/10 >>   SIGNIFICANT EVENTS: 4/06  Admit with weight gain, swelling   LINES/TUBES: R IJ CVC 4/10 >>   DISCUSSION: 70 year old male admitted 4/6 with dyspnea and weight gain. Found to have subacute renal failure & CHF - ? In setting of liver disease, ? Decompensated cor pulmonale.  He underwent placement of a PermCath on 4/9 (local + sedation).  During dialysis (2 L removed) on 4/10 he also received narcotics for pain.  ? If acute mental status changes are related to medications in the setting of poor renal/hepatic clearance.   He later developed nausea and  vomiting. Concern for possible aspiration but no overt infiltrate post vomiting on CXR.   ASSESSMENT / PLAN:  PULMONARY A: Acute on chronic hypercarbic respiratory failure - suspect element of OHS/OSA Pulmonary hypertension - PA peak 45 on echo, large right heart border noted on chest x-ray ? Decompensated cor pulmonale Concern for aspiration - in setting of nausea/vomiting ? OSA / OHS P:   Oxygen to support saturations > 90% If patient decompensates will need intubation Pulmonary hygiene-IS, mobilize as able Monitor serial CXR's with history of vomiting, check 4/12 Lower extremity dopplers negative, defer V/q scan for now as he is on CVVHD.  Will need outpatient sleep study  CARDIOVASCULAR A:  Shock, presumed hypovolemic and septic. ? Contribution liver disease, R heart failure Hypertension PVD / chronic venous stasis P:  Cardiology following, appreciate input Fluid balance per nephrology, appears to be total body volume overloaded.  Hold home antihypertensives Check CVP Titrate norepi for MAP > 65 Abx empiric as below  RENAL A:   Acute Renal Failure - noted approximately 2 months prior to admission, on ACE, renal ultrasound negative for hydronephrosis.  Oliguria with hyperkalemia, HD initiated 4/10 Hyperkalemia P:   Hemodialysis per nephrology, goal transition to IHD when feasible Trend BMP / UOP Replace electrolytes as indicated PermCath care  GASTROINTESTINAL A:   Morbid obesity Nausea / vomiting  P:   Nothing by mouth  Place OGT for bowel rest / decompression  See ID  HEMATOLOGIC A:   Macrocytic anemia  Thrombocytopenia  Leukocytosis  R/O PE - unable to complete CTA due to CAD, lower extremity doppler negative P:  Trend CBC, platelets Monitor for bleeding  Heparin gtt held due to oozing from CVC site, consider start next 24h as we have not fully eliminated PE from the ddx  INFECTIOUS A:   Sepsis Possible SBP  P:   Assess cultures as above   Empiric vancomycin and Zosyn  Monitor fever curve / WBC   ENDOCRINE A:   Diabetes  P:   CBG every 4 with sliding scale insulin   NEUROLOGIC A:   Acute encephalopathy - suspect metabolic with narcotics plus acute on chronic kidney  and liver disease.  Sedation for PermCath on 4/9, ? Clearance  P:   RASS goal: n/a Minimize sedating medications as able When necessary low-dose fentanyl for pain   FAMILY  - Updates: Wife updated at bedside 4/10 per Dr. Jerral Ralph & Dr. Isaiah Serge  - Inter-disciplinary family meet or Palliative Care meeting due by: 4/17   Independent CC time 35 minutes   Levy Pupa, MD, PhD 01/18/2016, 1:37 PM [facility] [phone] or if no answer [phone]

## 2016-01-18 NOTE — Progress Notes (Signed)
PT Cancellation Note  Patient Details Name: Maurice Paul MRN: [id number] DOB: 11/14/1945   Cancelled Treatment:    Reason Eval/Treat Not Completed: Patient not medically ready Pt now on CRRT and just  started on pressors. Will hold PT today and follow up as appropriate.   Blake DivineShauna A Malashia Kamaka 01/18/2016, 1:07 PM Mylo RedShauna Miley Blanchett, PT, DPT [phone]

## 2016-01-18 NOTE — Progress Notes (Signed)
OT Cancellation Note  Patient Details Name: Jewel BaizeWalter S Mcclaine MRN: [id number] DOB: 10/19/1945   Cancelled Treatment:    Reason Eval/Treat Not Completed: Medical issues which prohibited therapy (CRRT continuous, rapid response 4/10 , bed level only, ) Pt starting pressors 11 PM 01/17/16. OT to hold treatment today and recheck 01/19/16 for more appropriate time to progress patient.   Felecia ShellingJones, Patria Warzecha B   Caymen Dubray, Brynn   OTR/L Pager: [phone] Office: [phone] .  01/18/2016, 10:51 AM

## 2016-01-18 NOTE — Progress Notes (Signed)
Subjective:   Events noted- became hypotensive - needed pressors- not intubated- I put him on CRRT started last night at 11 PM- K supposedly 6.6 this AM but with slight hemolysis  Objective Vital signs in last 24 hours: Filed Vitals:   01/18/16 0500 01/18/16 0515 01/18/16 0530 01/18/16 0545  BP: 105/37 111/39 112/42 108/46  Pulse: 73 74 79 78  Temp:      TempSrc:      Resp: Height:      Weight:      SpO2: 92% 93% 93% 93%   Weight change:   Intake/Output Summary (Last 24 hours) at 01/18/16 0644 Last data filed at 01/18/16 0600  Gross per 24 hour  Intake  533.1 ml  Output   2808 ml  Net -2274.9 ml    Assessment/ Plan: Pt is a 70 y.o. yo male who was admitted on 01/13/2016 with volume overload - thought due to cirrhosis and right sided heart failure- had fairly normal creatinine in Feb but progressive renal failure since- had to initiate HD due to oligoanuria and climbing creatinine then with clinical decline now on CRRT  Assessment/Plan: 1. Renal- subacute picture of renal failure with most notably volume overload refractory to diuretics- req initiation of HD 4/10- then needed CRRT due to hemodynamic instability.  U/S normal- he does have proteinuria at 3 grams but no hematuria- low plts but heme not susicious for TTP/HUS type picture.  Working diagnosis right now is hemodynamically mediated kidney injury?   Now with clinical decline of unknown etiology- requiring CRRT.  Given gross hematuria will flush foley to see if any UOP 2. HTN/vol- now on pressors 3. Anemia- hgb over 10 at the moment- iron is OK - no ESA yet 4. Hyperkalemia- drinking OJ- HD on 1 K bath yest- has been on CRRT on 4 K bath overnight- will recheck K STAT this AM and if still high will change dialysate to zero K 5. Hypoxia- supposedly fairly clear CXR- cards to work up for possible PE- has not required intubation which is actually pretty surprising 6. Sepsis- thought to be etiology of decline- on  zosyn/vanc     Jolina Symonds A    Labs: Basic Metabolic Panel:  Recent Labs Lab 01/17/16 1423 01/17/16 1819 01/17/16 1838 01/18/16 0550  NA 135  --  138 135  K 6.2*  --  6.0* 6.6*  CL 96*  --  99* 98*  CO2 27  --  26 22  GLUCOSE 148*  --  135* 162*  BUN 40*  --  40* 39*  CREATININE 5.71*  --  5.65* 5.04*  CALCIUM 8.8*  --  9.0 8.7*  PHOS 8.2* 8.2*  --  6.9*   Liver Function Tests:  Recent Labs Lab 01/14/16 0025 01/14/16 0633  01/17/16 1423 01/17/16 1838 01/18/16 0550  AST 46* 41  --   --  41  --   ALT 24 23  --   --  22  --   ALKPHOS 64 56  --   --  57  --   BILITOT 2.1* 2.1*  --   --  1.5*  --   PROT 8.2* 7.3  --   --  7.7  --   ALBUMIN 3.7 3.4*  < > 3.7 3.5 3.8  < > = values in this interval not displayed. No results for input(s): LIPASE, AMYLASE in the last 168 hours.  Recent Labs Lab 01/14/16 0020 01/17/16 1423 01/17/16 1839  AMMONIA 66* 41* 36*   CBC:  Recent Labs Lab 01/14/16 0633  01/16/16 0311 01/17/16 0237 01/17/16 1423 01/17/16 1838 01/18/16 0550  WBC 6.9  < > 6.7 8.2 18.2* 16.3* 17.8*  NEUTROABS 5.2  --   --   --   --  14.5*  --   HGB 9.6*  < > 9.9* 10.3* 10.6* 10.7* 10.8*  HCT 30.3*  < > 28.9* 33.7* 33.5* 33.9* 33.4*  MCV 105.2*  < > 106.3* 110.1* 111.3* 111.9* 109.2*  PLT 104*  < > 105* 117* 96* 91* 151  < > = values in this interval not displayed. Cardiac Enzymes:  Recent Labs Lab 01/13/16 2236 01/14/16 0259 01/14/16 0633 01/17/16 1838  TROPONINI 0.25* 0.30* 0.25* 0.29*   CBG:  Recent Labs Lab 01/17/16 0534 01/17/16 1227 01/17/16 1638 01/17/16 2018 01/17/16 2329  GLUCAP 120* 150* 115* 129* 131*    Iron Studies: No results for input(s): IRON, TIBC, TRANSFERRIN, FERRITIN in the last 72 hours. Studies/Results: Dg Chest Port 1 View  01/17/2016  CLINICAL DATA:  Central catheter placement EXAM: PORTABLE CHEST 1 VIEW COMPARISON:  Study obtained earlier in the day FINDINGS: There is a new right jugular catheter  with the tip near the junction of the right jugular vein and superior vena cava. The left cyst dual-lumen central catheter tip is at the cavoatrial junction. Nasogastric tube tip and side port are below the diaphragm. Note that a portion of the lateral right hemithorax is not visualized. No pneumothorax is appreciable in visualized regions. There is no edema or consolidation in visualized regions. There is stable mild atelectasis in left base. Heart is mildly enlarged with pulmonary vascularity within normal limits. No adenopathy. IMPRESSION: Note that a portion of the lateral right hemi thorax is not visualized. The new right jugular catheter tip is near the junction of the right jugular vein and superior vena cava. No pneumothorax evident in the regions which are visualized. Other tubes and catheters as described. Mild left base atelectasis. Visualized lungs elsewhere clear. Stable cardiomegaly. Electronically Signed   By: Bretta BangWilliam  Woodruff III M.D.   On: 01/17/2016 19:14   Dg Chest Port 1 View  01/17/2016  CLINICAL DATA:  Shortness of breath.  Altered mental status. EXAM: PORTABLE CHEST 1 VIEW COMPARISON:  01/16/2016 FINDINGS: Left jugular dialysis catheter terminates over the right atrium, unchanged. The cardiac silhouette remains enlarged. Lung volumes are diminished, slightly more so than on the prior study. There is pulmonary vascular congestion with mild left greater than right basilar opacity. No sizable pleural effusion or pneumothorax is identified. IMPRESSION: Cardiomegaly and hypoinflation with pulmonary vascular congestion and mild bibasilar opacities which may reflect atelectasis and/or mild edema. Electronically Signed   By: Sebastian AcheAllen  Grady M.D.   On: 01/17/2016 13:17   Dg Chest Port 1 View  01/16/2016  CLINICAL DATA:  Status post dialysis catheter insertion EXAM: PORTABLE CHEST 1 VIEW COMPARISON:  01/13/2016 FINDINGS: Left chest wall dialysis catheter is noted with tip in the projection of the  right atria. No pneumothorax is identified. There is mild cardiac enlargement. Decreased lung volumes. No pleural effusion or edema. No airspace consolidation. IMPRESSION: 1. No complicating features identified after dialysis catheter insertion. 2. Resolution of pulmonary edema and right effusion. Electronically Signed   By: Signa Kellaylor  Stroud M.D.   On: 01/16/2016 16:25   Dg Abd Portable 2v  01/17/2016  CLINICAL DATA:  Vomiting EXAM: PORTABLE ABDOMEN - 2 VIEW COMPARISON:  CT abdomen 12/01/2014 FINDINGS: Image quality is suboptimal  due to large patient size Normal bowel gas pattern. Negative for obstruction or ileus. Limited decubitus evaluation without definite free air. Central venous catheter tip in the right atrium. IMPRESSION: Limited study due to patient size.  No acute abnormality. Electronically Signed   By: Marlan Palau M.D.   On: 01/17/2016 15:21   Dg Fluoro Guide Cv Line-no Report  01/16/2016  CLINICAL DATA:  FLOURO GUIDE CV LINE Fluoroscopy was utilized by the requesting physician.  No radiographic interpretation.   Medications: Infusions: . heparin 10,000 units/ 20 mL infusion syringe 1,010 Units/hr (01/18/16 0618)  . norepinephrine (LEVOPHED) Adult infusion 29 mcg/min (01/18/16 0417)  . dialysis replacement fluid (prismasate) 300 mL/hr at 01/17/16 2044  . dialysis replacement fluid (prismasate) 300 mL/hr at 01/17/16 2039  . dialysate (PRISMASATE) 1,500 mL/hr at 01/18/16 0319  . vasopressin (PITRESSIN) infusion - *FOR SHOCK* 0.04 Units/min (01/17/16 2109)    Scheduled Medications: . insulin aspart  0-5 Units Subcutaneous QHS  . insulin aspart  0-9 Units Subcutaneous TID WC  . ipratropium-albuterol  3 mL Nebulization Q4H  . lactulose  300 mL Rectal Once  . methylPREDNISolone (SOLU-MEDROL) injection  40 mg Intravenous Q6H  . pantoprazole (PROTONIX) IV  40 mg Intravenous Q24H  . piperacillin-tazobactam (ZOSYN)  IV  2.25 g Intravenous Q6H  . sodium chloride flush  3 mL Intravenous  Q12H  . vancomycin  1,500 mg Intravenous Q24H    have reviewed scheduled and prn medications.  Physical Exam: General: confused Heart: RRR Lungs: no rales Abdomen: obese Extremities: pitting edema Dialysis Access: left PC placed 4/9 Foley with bright red blood ?trauma    01/18/2016,6:44 AM  LOS: 5 days

## 2016-01-18 NOTE — Care Management Important Message (Signed)
Important Message  Patient Details  Name: Maurice Paul MRN: [id number] Date of Birth: 11/05/1945   Medicare Important Message Given:  Other (see comment)    Bedford Winsor Abena 01/18/2016, 11:24 AM

## 2016-01-19 LAB — GLUCOSE, CAPILLARY
GLUCOSE-CAPILLARY: 160 mg/dL — AB (ref 65–99)
GLUCOSE-CAPILLARY: 160 mg/dL — AB (ref 65–99)
GLUCOSE-CAPILLARY: 170 mg/dL — AB (ref 65–99)
Glucose-Capillary: 180 mg/dL — ABNORMAL HIGH (ref 65–99)
Glucose-Capillary: 180 mg/dL — ABNORMAL HIGH (ref 65–99)
Glucose-Capillary: 170 mg/dL — ABNORMAL HIGH (ref 65–99)
Glucose-Capillary: 192 mg/dL — ABNORMAL HIGH (ref 65–99)

## 2016-01-19 LAB — CBC
HCT: 31.4 % — ABNORMAL LOW (ref 39.0–52.0)
Hemoglobin: 10.2 g/dL — ABNORMAL LOW (ref 13.0–17.0)
MCH: 35.5 pg — ABNORMAL HIGH (ref 26.0–34.0)
MCHC: 32.5 g/dL (ref 30.0–36.0)
MCV: 109.4 fL — ABNORMAL HIGH (ref 78.0–100.0)
Platelets: 98 K/uL — ABNORMAL LOW (ref 150–400)
RBC: 2.87 MIL/uL — ABNORMAL LOW (ref 4.22–5.81)
RDW: 16.3 % — ABNORMAL HIGH (ref 11.5–15.5)
WBC: 8.7 K/uL (ref 4.0–10.5)

## 2016-01-19 LAB — RENAL FUNCTION PANEL
ALBUMIN: 3.5 g/dL (ref 3.5–5.0)
Albumin: 3.6 g/dL (ref 3.5–5.0)
Anion gap: 12 (ref 5–15)
Anion gap: 11 (ref 5–15)
BUN: 33 mg/dL — ABNORMAL HIGH (ref 6–20)
BUN: 33 mg/dL — AB (ref 6–20)
CO2: 25 mmol/L (ref 22–32)
CO2: 26 mmol/L (ref 22–32)
CREATININE: 3.83 mg/dL — AB (ref 0.61–1.24)
Calcium: 8.3 mg/dL — ABNORMAL LOW (ref 8.9–10.3)
Calcium: 8.1 mg/dL — ABNORMAL LOW (ref 8.9–10.3)
Chloride: 99 mmol/L — ABNORMAL LOW (ref 101–111)
Chloride: 98 mmol/L — ABNORMAL LOW (ref 101–111)
Creatinine, Ser: 4.09 mg/dL — ABNORMAL HIGH (ref 0.61–1.24)
GFR calc Af Amer: 16 mL/min — ABNORMAL LOW
GFR calc Af Amer: 17 mL/min — ABNORMAL LOW (ref >60)
GFR calc non Af Amer: 13 mL/min — ABNORMAL LOW
GFR, EST NON AFRICAN AMERICAN: 15 mL/min — AB (ref >60)
Glucose, Bld: 186 mg/dL — ABNORMAL HIGH (ref 65–99)
Glucose, Bld: 187 mg/dL — ABNORMAL HIGH (ref 65–99)
PHOSPHORUS: 5 mg/dL — AB (ref 2.5–4.6)
Phosphorus: 5.8 mg/dL — ABNORMAL HIGH (ref 2.5–4.6)
Potassium: 5.2 mmol/L — ABNORMAL HIGH (ref 3.5–5.1)
Potassium: 4.7 mmol/L (ref 3.5–5.1)
SODIUM: 135 mmol/L (ref 135–145)
Sodium: 136 mmol/L (ref 135–145)

## 2016-01-19 LAB — URINE CULTURE: Culture: NO GROWTH

## 2016-01-19 LAB — APTT: aPTT: 33 seconds (ref 24–37)

## 2016-01-19 LAB — PROCALCITONIN: Procalcitonin: 0.53 ng/mL

## 2016-01-19 LAB — MAGNESIUM: MAGNESIUM: 2.4 mg/dL (ref 1.7–2.4)

## 2016-01-19 LAB — HEPARIN LEVEL (UNFRACTIONATED): Heparin Unfractionated: 0.55 IU/mL (ref 0.30–0.70)

## 2016-01-19 MED ORDER — LACTULOSE 10 GM/15ML PO SOLN
30.0000 g | Freq: Three times a day (TID) | ORAL | Status: DC
  Administered 2016-01-19 – 2016-01-23 (×11): 30 g
  Filled 2016-01-19 (×12): qty 45

## 2016-01-19 MED ORDER — HEPARIN (PORCINE) IN NACL 100-0.45 UNIT/ML-% IJ SOLN
1550.0000 [IU]/h | INTRAMUSCULAR | Status: DC
  Administered 2016-01-19 – 2016-01-20 (×2): 1750 [IU]/h via INTRAVENOUS
  Filled 2016-01-19 (×4): qty 250

## 2016-01-19 MED ORDER — NOREPINEPHRINE BITARTRATE 1 MG/ML IV SOLN
2.0000 ug/min | INTRAVENOUS | Status: DC
  Administered 2016-01-20 – 2016-01-21 (×2): 5 ug/min via INTRAVENOUS
  Administered 2016-01-21: 10 ug/min via INTRAVENOUS
  Administered 2016-01-22: 1 ug/min via INTRAVENOUS
  Administered 2016-01-22: 0.5 ug/min via INTRAVENOUS
  Administered 2016-01-24: 5 ug/min via INTRAVENOUS
  Administered 2016-01-24: 2.5 ug/min via INTRAVENOUS
  Filled 2016-01-19 (×6): qty 4

## 2016-01-19 NOTE — Progress Notes (Signed)
ANTICOAGULATION CONSULT NOTE - Initial Consult  Pharmacy Consult for heparin Indication: VTE treatment  Allergies  Allergen Reactions  . Sulfa Antibiotics     Hives  . Onglyza [Saxagliptin] Rash    Patient Measurements: Height: 5\' 10"  (177.8 cm) Weight: (!) 340 lb 13.3 oz (154.6 kg) IBW/kg (Calculated) : 73 Heparin Dosing Weight: 110 kg  Vital Signs: Temp: 98.9 F (37.2 C) (04/12 0800) Temp Source: Oral (04/12 0800) BP: 124/46 mmHg (04/12 1030) Pulse Rate: 73 (04/12 1030)  Labs:  Recent Labs  01/17/16 1838 01/17/16 2320 01/18/16 0550 01/18/16 1511 01/19/16 0525  HGB 10.7*  --  10.8*  --  10.2*  HCT 33.9*  --  33.4*  --  31.4*  PLT 91*  --  151  --  98*  APTT  --   --   --   --  33  LABPROT  --  18.8*  --   --   --   INR  --  1.57*  --   --   --   CREATININE 5.65*  --  5.04* 4.74* 4.09*  TROPONINI 0.29*  --   --   --   --     Estimated Creatinine Clearance: 24.7 mL/min (by C-G formula based on Cr of 4.09).   Medical History: Past Medical History  Diagnosis Date  . PVD (peripheral vascular disease) (HCC)   . Tobacco chew use     for 30 years  . Hypertension   . SDH (subdural hematoma) (HCC) 03/2014    fall  . DVT (deep venous thrombosis) (HCC)     not on anticoagulation due to hematuria  . Stasis dermatitis   . Cirrhosis of liver (HCC)   . Diabetes mellitus, type 2 [facility]HCC)     Assessment:  70 y/o M with hx of cirrhosis now with weight gain and shortness of breath. Pharmacy is consulted to start heparin for hx of DVT and potential PE. Will dose cautiously after bleed from CVC site. Hgb  10.2, plts 98. 110 kg dosing weight.  Goal of Therapy:  HL 0.3 - 0.7 Monitor platelets by anticoag policy: Yes   Plan:  Heparin gtt 1750 units/hr NO bolus 8 hr HL Daily HL, CBC Monitor for S&S of bleed  Sandi CarneNick Verbena Boeding, PharmD Pharmacy Resident Pager: [phone] 01/19/2016,11:04 AM

## 2016-01-19 NOTE — Progress Notes (Signed)
ANTICOAGULATION CONSULT NOTE  Pharmacy Consult for heparin Indication: VTE treatment  Allergies  Allergen Reactions  . Sulfa Antibiotics     Hives  . Onglyza [Saxagliptin] Rash    Patient Measurements: Height: 5\' 10"  (177.8 cm) Weight: (!) 340 lb 13.3 oz (154.6 kg) IBW/kg (Calculated) : 73 Heparin Dosing Weight: 110 kg  Vital Signs: Temp: 97.9 F (36.6 C) (04/12 1957) Temp Source: Oral (04/12 1957) BP: 113/54 mmHg (04/12 2000) Pulse Rate: 68 (04/12 2000)  Labs:  Recent Labs  01/17/16 1838 01/17/16 2320 01/18/16 0550 01/18/16 1511 01/19/16 0525 01/19/16 1533 01/19/16 2007  HGB 10.7*  --  10.8*  --  10.2*  --   --   HCT 33.9*  --  33.4*  --  31.4*  --   --   PLT 91*  --  151  --  98*  --   --   APTT  --   --   --   --  33  --   --   LABPROT  --  18.8*  --   --   --   --   --   INR  --  1.57*  --   --   --   --   --   HEPARINUNFRC  --   --   --   --   --   --  0.55  CREATININE 5.65*  --  5.04* 4.74* 4.09* 3.83*  --   TROPONINI 0.29*  --   --   --   --   --   --     Estimated Creatinine Clearance: 26.4 mL/min (by C-G formula based on Cr of 3.83).   Medical History: Past Medical History  Diagnosis Date  . PVD (peripheral vascular disease) (HCC)   . Tobacco chew use     for 30 years  . Hypertension   . SDH (subdural hematoma) (HCC) 03/2014    fall  . DVT (deep venous thrombosis) (HCC)     not on anticoagulation due to hematuria  . Stasis dermatitis   . Cirrhosis of liver (HCC)   . Diabetes mellitus, type 2 [facility](HCC)     Assessment:  70 y/o M with hx of cirrhosis now with weight gain and shortness of breath. Pharmacy is consulted to start heparin for hx of DVT and potential PE. Will dose cautiously after bleed from CVC site. Hgb  10.2, plts 98. 110 kg dosing weight. Heparin level therapeutic at 0.55, will continue current rate and check confirmatory level in the morning  Goal of Therapy:  Heparin Level 0.3 - 0.7 Monitor platelets by anticoag policy: Yes   Plan:  Continue Heparin gtt 1750 units/hr NO bolus 8 hr HL Daily HL, CBC Monitor for S&S of bleed   Thank you for allowing us to participate in this patients care. Signe Coltonya C Clariza Sickman, PharmD Pager: [phone] 01/19/2016,8:46 PM

## 2016-01-19 NOTE — Progress Notes (Signed)
OT Cancellation Note  Patient Details Name: Maurice Paul MRN: [id number] DOB: 06/17/1946   Cancelled Treatment:    Reason Eval/Treat Not Completed: Medical issues which prohibited therapy - Pt on pressors and CRRT.  Will check back on Friday to determine appropriateness of OT at that time.   Angelene GiovanniConarpe, Maurice Paul M  Naser Schuld [place], OTR/L [phone]  01/19/2016, 11:11 AM

## 2016-01-19 NOTE — Progress Notes (Signed)
PT Cancellation Note  Patient Details Name: Maurice Paul MRN: [id number] DOB: 07/27/1946   Cancelled Treatment:    Reason Eval/Treat Not Completed: Patient not medically ready.  Spoke with RN and pt currently lethargic, on Levophed, and on CRRT.  Will hold PT and mobility at this time and f/u as appropriate.     Sunny SchleinRitenour, Jaelle Campanile F, [place]PT [phone] 01/19/2016, 9:41 AM

## 2016-01-19 NOTE — Progress Notes (Signed)
Pt throughout the evening got progressively confused.  Constant reassurance did not calm patient.  Patient very agitated and paranoid.  Continuing to reorient patient to time, situation and place.  Wife called and updated.  Wife spoke with patient.

## 2016-01-19 NOTE — Progress Notes (Signed)
Subjective:   Remains confused overnight- CRRT running well- finally have K down some with zero K dialysate.  Still on good dose pressors  Objective Vital signs in last 24 hours: Filed Vitals:   01/19/16 0600 01/19/16 0615 01/19/16 0630 01/19/16 0645  BP: 132/56 134/56 134/51 131/55  Pulse: 79 78 79 77  Temp:      TempSrc:      Resp: Height:      Weight:      SpO2: 97% 100% 96% 90%   Weight change: 1 kg (2 lb 3.3 oz)  Intake/Output Summary (Last 24 hours) at 01/19/16 0720 Last data filed at 01/19/16 0700  Gross per 24 hour  Intake 1954.67 ml  Output   1979 ml  Net -24.33 ml    Assessment/ Plan: Pt is a 70 y.o. yo male who was admitted on 01/13/2016 with volume overload - thought due to cirrhosis and right sided heart failure- had fairly normal creatinine in Feb but progressive renal failure since- had to initiate HD due to oligoanuria and climbing creatinine then with clinical decline now on CRRT  Assessment/Plan: 1. Renal- subacute picture of renal failure with most notably volume overload refractory to diuretics- req initiation of HD 4/10- then needed CRRT due to hemodynamic instability.  U/S normal- he does have proteinuria at 3 grams but no hematuria- low plts but heme not susicious for TTP/HUS type picture.  Working diagnosis right now is hemodynamically mediated kidney injury?   Now with clinical decline of unknown etiology- requiring CRRT.  Given gross hematuria will flush foley to see if any UOP- but none 2. HTN/vol- now on pressors- given CVP in the 20's and pitting edema will try for some volume removal  3. Anemia- hgb over 10 at the moment- iron is OK - no ESA yet 4. Hyperkalemia- has been persistent- now finally making a move in the right direction- cont to follow 5. Hypoxia- supposedly fairly clear CXR- cards to work up for possible PE- has not required intubation which is actually pretty surprising 6. Sepsis- thought to be etiology of decline- on zosyn/vanc -  WBC better     Maurice Paul A    Labs: Basic Metabolic Panel:  Recent Labs Lab 01/18/16 0550 01/18/16 0744 01/18/16 1511 01/19/16 0525  NA 135  --  134* 136  K 6.6* 5.9* 5.8* 5.2*  CL 98*  --  96* 99*  CO2 22  --  24 25  GLUCOSE 162*  --  171* 186*  BUN 39*  --  36* 33*  CREATININE 5.04*  --  4.74* 4.09*  CALCIUM 8.7*  --  8.5* 8.3*  PHOS 6.9*  --  6.3* 5.8*   Liver Function Tests:  Recent Labs Lab 01/14/16 0025 01/14/16 0633  01/17/16 1838 01/18/16 0550 01/18/16 1511 01/19/16 0525  AST 46* 41  --  41  --   --   --   ALT 24 23  --  22  --   --   --   ALKPHOS 64 56  --  57  --   --   --   BILITOT 2.1* 2.1*  --  1.5*  --   --   --   PROT 8.2* 7.3  --  7.7  --   --   --   ALBUMIN 3.7 3.4*  < > 3.5 3.8 3.8 3.6  < > = values in this interval not displayed. No results for input(s): LIPASE, AMYLASE in the  last 168 hours.  Recent Labs Lab 01/14/16 0020 01/17/16 1423 01/17/16 1839  AMMONIA 66* 41* 36*   CBC:  Recent Labs Lab 01/14/16 0633  01/17/16 0237 01/17/16 1423 01/17/16 1838 01/18/16 0550 01/19/16 0525  WBC 6.9  < > 8.2 18.2* 16.3* 17.8* 8.7  NEUTROABS 5.2  --   --   --  14.5*  --   --   HGB 9.6*  < > 10.3* 10.6* 10.7* 10.8* 10.2*  HCT 30.3*  < > 33.7* 33.5* 33.9* 33.4* 31.4*  MCV 105.2*  < > 110.1* 111.3* 111.9* 109.2* 109.4*  PLT 104*  < > 117* 96* 91* 151 98*  < > = values in this interval not displayed. Cardiac Enzymes:  Recent Labs Lab 01/13/16 2236 01/14/16 0259 01/14/16 0633 01/17/16 1838  TROPONINI 0.25* 0.30* 0.25* 0.29*   CBG:  Recent Labs Lab 01/18/16 1509 01/18/16 1956 01/18/16 2228 01/19/16 0016 01/19/16 0356  GLUCAP 148* 171* 179* 170* 180*    Iron Studies: No results for input(s): IRON, TIBC, TRANSFERRIN, FERRITIN in the last 72 hours. Studies/Results: Dg Chest Port 1 View  01/17/2016  CLINICAL DATA:  Central catheter placement EXAM: PORTABLE CHEST 1 VIEW COMPARISON:  Study obtained earlier in the day  FINDINGS: There is a new right jugular catheter with the tip near the junction of the right jugular vein and superior vena cava. The left cyst dual-lumen central catheter tip is at the cavoatrial junction. Nasogastric tube tip and side port are below the diaphragm. Note that a portion of the lateral right hemithorax is not visualized. No pneumothorax is appreciable in visualized regions. There is no edema or consolidation in visualized regions. There is stable mild atelectasis in left base. Heart is mildly enlarged with pulmonary vascularity within normal limits. No adenopathy. IMPRESSION: Note that a portion of the lateral right hemi thorax is not visualized. The new right jugular catheter tip is near the junction of the right jugular vein and superior vena cava. No pneumothorax evident in the regions which are visualized. Other tubes and catheters as described. Mild left base atelectasis. Visualized lungs elsewhere clear. Stable cardiomegaly. Electronically Signed   By: Bretta BangWilliam  Woodruff III M.D.   On: 01/17/2016 19:14   Dg Chest Port 1 View  01/17/2016  CLINICAL DATA:  Shortness of breath.  Altered mental status. EXAM: PORTABLE CHEST 1 VIEW COMPARISON:  01/16/2016 FINDINGS: Left jugular dialysis catheter terminates over the right atrium, unchanged. The cardiac silhouette remains enlarged. Lung volumes are diminished, slightly more so than on the prior study. There is pulmonary vascular congestion with mild left greater than right basilar opacity. No sizable pleural effusion or pneumothorax is identified. IMPRESSION: Cardiomegaly and hypoinflation with pulmonary vascular congestion and mild bibasilar opacities which may reflect atelectasis and/or mild edema. Electronically Signed   By: Sebastian AcheAllen  Grady M.D.   On: 01/17/2016 13:17   Dg Abd Portable 2v  01/17/2016  CLINICAL DATA:  Vomiting EXAM: PORTABLE ABDOMEN - 2 VIEW COMPARISON:  CT abdomen 12/01/2014 FINDINGS: Image quality is suboptimal due to large patient  size Normal bowel gas pattern. Negative for obstruction or ileus. Limited decubitus evaluation without definite free air. Central venous catheter tip in the right atrium. IMPRESSION: Limited study due to patient size.  No acute abnormality. Electronically Signed   By: Marlan Palauharles  Clark M.D.   On: 01/17/2016 15:21   Medications: Infusions: . norepinephrine (LEVOPHED) Adult infusion 23 mcg/min (01/19/16 0717)  . dialysate (PRISMASATE) 1,500 mL/hr at 01/19/16 0557  . dialysis replacement fluid (  prismasate) 300 mL/hr at 01/19/16 0610  . dialysis replacement fluid (prismasate) 300 mL/hr at 01/18/16 1339  . vasopressin (PITRESSIN) infusion - *FOR SHOCK* 0.04 Units/min (01/19/16 0700)    Scheduled Medications: . hydrocortisone sod succinate (SOLU-CORTEF) inj  50 mg Intravenous Q6H  . insulin aspart  0-5 Units Subcutaneous QHS  . insulin aspart  0-9 Units Subcutaneous TID WC  . ipratropium-albuterol  3 mL Nebulization QID  . lactulose  20 g Per Tube BID  . lactulose  300 mL Rectal Once  . pantoprazole (PROTONIX) IV  40 mg Intravenous Q24H  . piperacillin-tazobactam (ZOSYN)  IV  2.25 g Intravenous Q6H  . sodium chloride flush  3 mL Intravenous Q12H  . vancomycin  1,500 mg Intravenous Q24H    have reviewed scheduled and prn medications.  Physical Exam: General: confused Heart: RRR Lungs: no rales Abdomen: obese Extremities: pitting edema Dialysis Access: left PC placed 4/9    01/19/2016,7:20 AM  LOS: 6 days

## 2016-01-19 NOTE — Progress Notes (Signed)
Subjective:  Continues on hemodialysis.  He is lethargic but able to converse.  Requiring pressors. Not SOB   Objective:  Vital Signs in the last 24 hours: BP 114/44 mmHg  Pulse 73  Temp(Src) 98.9 F (37.2 C) (Oral)  Resp 15  Ht 5\' 10"  (1.778 m)  Wt 154.6 kg (340 lb 13.3 oz)  BMI 48.90 kg/m2  SpO2 93%  Physical Exam: Lethargic, but awake  obese white male currently in no acute distress Lungs:  Reduced BS  Cardiac:  Regular rhythm, normal S1 and S2, no S3, no murmur Abdomen: Quite distended Extremities:  4+ edema noted with brawny erythema extending all the way up the lower extremities  Intake/Output from previous day: 04/11 0701 - 04/12 0700 In: 1954.7 [I.V.:1204.7; NG/GT:50; IV Piggyback:700] Out: 1979 [Emesis/NG output:50] Weight Filed Weights   01/17/16 1800 01/18/16 0500 01/19/16 0428  Weight: 153.9 kg (339 lb 4.6 oz) 155 kg (341 lb 11.4 oz) 154.6 kg (340 lb 13.3 oz)    Lab Results: Basic Metabolic Panel:  Recent Labs  16/07/9603/11/17 1511 01/19/16 0525  NA 134* 136  K 5.8* 5.2*  CL 96* 99*  CO2 24 25  GLUCOSE 171* 186*  BUN 36* 33*  CREATININE 4.74* 4.09*    CBC:  Recent Labs  01/17/16 1838 01/18/16 0550 01/19/16 0525  WBC 16.3* 17.8* 8.7  NEUTROABS 14.5*  --   --   HGB 10.7* 10.8* 10.2*  HCT 33.9* 33.4* 31.4*  MCV 111.9* 109.2* 109.4*  PLT 91* 151 98*   PROTIME: Lab Results  Component Value Date   INR 1.57* 01/17/2016   INR 1.46 01/14/2016   INR 1.10 12/24/2013    Telemetry: Normal sinus rhythm  Assessment/Plan:  1.  Right heart failure and hypotension with pul hypertension.  D dimer is up and PE not eliminated completely although legs negative for DVT. 2.  Prior diagnosis of cirrhosis questionable etiology but evidently has refused liver biopsy 3. Subacute renal failure progressive now on dialysis. 4.  Right heart failure unclear etiology.  Unclear whether this is due to morbid obesity, subacute pulmonary emboli, or sleep apnea.   5.   Thrombocytopenia, macrocytosis, mildly low albumin and evidence of cirrhosis of which could be markers of cirrhosis 7.  History of DVT previously at the time of subarachnoid hemorrhage 8.  History of traumatic subdural hemorrhage 9.  Currently off of heparin due to oozing/hematuria  Recommendations:  Remains seriously ill on pressors.  Nothing to add other than consideration of PE as contributing factor.     Darden PalmerW. Spencer Christophe Rising, Jr.  MD [facility] HealthFACC Cardiology  01/19/2016, 8:51 AM

## 2016-01-19 NOTE — Progress Notes (Signed)
PULMONARY / CRITICAL CARE MEDICINE   Name: Maurice Paul MRN: [id number] DOB: 09/12/1946    ADMISSION DATE:  01/13/2016 CONSULTATION DATE:  01/17/16  REFERRING MD:  Dr. 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    The patient reported on admission that he had gained 4-5 lbs a week for the month prior to admission.  He also had been treated for LE cellulitis with antibiotics and una-boots without significant relief (most recently has been treated with Keflex).  Patient also reported an obvious swelling in his lower extremities and abdominal distention.  Initial ER evaluation concerning for congestive heart failure, elevated troponin and acute kidney injury. Labs-NA 133, K5.2, CR 2.82, troponin 0.25, WBC 7.4, Hgb 11.0, MCV 106.1 and platelets 104. Initial chest x-ray with vascular congestion but no frank pulmonary edema, small bilateral pleural effusions. The patient was admitted per [facility] hospitalist for further evaluation.  The patient was evaluated by nephrology and felt to have subacute renal insufficiency in the setting of liver disease, ACE inhibitor use and right heart failure. He ultimately underwent placement of a PermCath per Dr. Edilia Boickson on 4/9.  He also was evaluated by Dr. Donnie Ahoilley of cardiology for right heart failure of unclear etiology (right heart failure vs cirrhosis vs OSA/OHS vs PE or combination).    Of note, the patient recently had an EGD completed by Dr. Ewing SchleinMagod on 3/29 which showed evidence of gastritis but no other  abnormality.  He previously was treated with Lasix and Aldactone.  The patient was noted to have altered mental status a.m. of 4/10.  During dialysis he was treated with narcotics for pain. After return from HD to the medical floor he was noted to be more altered and an ABG was obtained which reflected hypercarbic respiratory failure (acute on chronic). PCCM consulted for evaluation.    Patient's wife denies known history of snoring or periods of apnea. 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  SUBJECTIVE:  Remains on pressors CVVHD running Awake and alert but still confused  VITAL SIGNS: BP 130/58 mmHg  Pulse 75  Temp(Src) 98.9 F (37.2 C) (Oral)  Resp 17  Ht 5\' 10"  (1.778 m)  Wt 340 lb 13.3 oz (154.6 kg)  BMI 48.90 kg/m2  SpO2 95%  HEMODYNAMICS: CVP:  [26 mmHg] 26 mmHg  VENTILATOR SETTINGS:    INTAKE / OUTPUT: I/O last 3 completed shifts: In: 2562.2 [I.V.:1712.2; NG/GT:50; IV Piggyback:800] Out: 2832 [Urine:25; Emesis/NG output:50; Other:2757]  PHYSICAL EXAMINATION: General:  Obese male lying in bed, NAD Neuro:  Awake and alert, oriented to person and place, moving all 4 extremities , mild asterixis  HEENT:  MM pink/moist, unable to appreciate JVD, L perm cath present, Right IJ with dried blood, supplemental O2 via [place] Cardiovascular:  s1s2 rrr, no m/r/g, palpable peripheral pulses Lungs:  Mild rales at bases, no wheezing Abdomen:  Obese/soft, distended but not tense, bsx4 hypoactive  Musculoskeletal:  No acute deformities  Skin:  Warm/dry, BLE 3+  edema, chronic venous stasis dermatitis  LABS:  BMET  Recent Labs Lab 01/18/16 0550 01/18/16 0744 01/18/16 1511 01/19/16 0525  NA 135  --  134* 136  K 6.6* 5.9* 5.8* 5.2*  CL 98*  --  96* 99*  CO2 22  --  24 25  BUN 39*  --  36* 33*  CREATININE 5.04*  --  4.74* 4.09*  GLUCOSE 162*  --  171* 186*    Electrolytes  Recent Labs Lab 01/18/16 0550 01/18/16 1511 01/19/16 0525  CALCIUM 8.7*  8.5* 8.3*  MG 2.4  --  2.4  PHOS 6.9* 6.3* 5.8*    CBC  Recent Labs Lab 01/17/16 1838 01/18/16 0550 01/19/16 0525  WBC 16.3* 17.8* 8.7  HGB 10.7* 10.8* 10.2*  HCT 33.9* 33.4* 31.4*  PLT 91* 151 98*    Coag's  Recent Labs Lab 01/14/16 0025 01/17/16 2320 01/19/16 0525  APTT  --   --  33  INR 1.46 1.57*  --     Sepsis Markers  Recent Labs Lab 01/17/16 1423 01/17/16 1819 01/19/16 0525  LATICACIDVEN 1.9 2.1*  --   PROCALCITON 0.36  --  0.53    ABG  Recent Labs Lab 01/17/16 1435 01/17/16 1831  PHART 7.153* 7.177*  PCO2ART 79.0* 75.3*  PO2ART 61.6* 77.0*    Liver Enzymes  Recent Labs Lab 01/14/16 0025 01/14/16 0633  01/17/16 1838 01/18/16 0550 01/18/16 1511 01/19/16 0525  AST 46* 41  --  41  --   --   --   ALT 24 23  --  22  --   --   --   ALKPHOS 64 56  --  57  --   --   --   BILITOT 2.1* 2.1*  --  1.5*  --   --   --   ALBUMIN 3.7 3.4*  < > 3.5 3.8 3.8 3.6  < > = values in this interval not displayed.  Cardiac Enzymes  Recent Labs Lab 01/14/16 0259 01/14/16 0633 01/17/16 1838  TROPONINI 0.30* 0.25* 0.29*    Glucose  Recent Labs Lab 01/18/16 1151 01/18/16 1509 01/18/16 1956 01/18/16 2228 01/19/16 0016 01/19/16 0356  GLUCAP 155* 148* 171* 179* 170* 180*    Imaging No results found.   STUDIES:  4/07  LE Doppler >> no evidence of DVT bilaterally 4/07  ABD [place] >> normal amount of ascites, hepatic cirrhosis 4/07  Renal US >> small right renal cyst, otherwise kidneys unremarkable 4/08  ECHO >> EF 60-65%, normal wall motion, mild systolic flattening of ventricular septum c/w RV pressure overload, mild MR, LA moderately dilated, RV mildly dilated, RA mod to severely dilated, moderate TR, PA Peak 45 mm Hg  CULTURES: BCx2 4/10 >> NGTD UA 4/10 >> TNTC RBC, 6-30 WBC, Nitrite neg UC 4/10 >> NG at 24 hours Sputum not collected  ANTIBIOTICS: Vanco 4/10 >> 4/12 Zosyn 4/10 >>   SIGNIFICANT EVENTS: 4/06  Admit with weight gain,  swelling   LINES/TUBES: R IJ CVC 4/10 >>  Portacath 4/9>>  DISCUSSION: 70 year old male admitted 4/6 with dyspnea and weight gain. Found to have subacute renal failure & CHF - ? In setting of liver disease, ? Decompensated cor pulmonale.  He underwent placement of a PermCath on 4/9 (local + sedation).  During dialysis (2 L removed) on 4/10 he also received narcotics for pain.  ? If acute mental status changes are related to medications in the setting of poor renal/hepatic clearance.   He later developed  nausea and vomiting. Concern for possible aspiration but no overt infiltrate post vomiting on CXR.   ASSESSMENT / PLAN:  PULMONARY A: Acute on chronic hypercarbic respiratory failure - suspect element of OHS/OSA Pulmonary hypertension - PA peak 45 on echo, large right heart border noted on chest x-ray ? Decompensated cor pulmonale,  Concern for aspiration - in setting of nausea/vomiting ? OSA / OHS P:   Oxygen to support saturations > 90% currently on 2L supplemental O2 via [place] If patient decompensates will need intubation Pulmonary hygiene-IS, mobilize as able Monitor serial CXR's with history of vomiting, check 4/12 Lower extremity dopplers negative, defer V/q scan for now as he is on CVVHD.  Will need outpatient sleep study  CARDIOVASCULAR A:  Shock, presumed hypovolemic and septic. ? Contribution liver disease, R heart failure Hypertension PVD / chronic venous stasis P:  Cardiology following, appreciate input Fluid balance per nephrology, appears to be total body volume overloaded with CVP of 26.   MAP goal > 65 can start to trite pressors down Abx empiric as below  RENAL A:   Acute Renal Failure - noted approximately 2 months prior to admission, on ACE, renal ultrasound negative for hydronephrosis.  Oliguria with hyperkalemia, HD initiated 4/10 Hyperkalemia- improving P:   Hemodialysis per nephrology, goal transition to IHD when feasible Trend BMP / UOP Replace  electrolytes as indicated PermCath care  GASTROINTESTINAL A:   Morbid obesity Nausea / vomiting  R/O Portal Venous Thrombis P:   Nothing by mouth  See ID U/S with Duplex to eval for portal venous thrombosis  HEMATOLOGIC A:   Macrocytic anemia  Thrombocytopenia  Leukocytosis - resolved with broad spectrum ABx R/O PE - unable to complete CTA due to CAD, lower extremity doppler negative P:  Trend CBC, platelets Monitor for bleeding  Heparin gtt held due to oozing from CVC site, restart heparin without bolus  INFECTIOUS A:   Sepsis- possible urinary source Possible SBP  P:   Assess cultures as above  D/C Vanc today with no positive cultures Continue Zosyn, consider change to Ceftriaxone tomorrow. Monitor fever curve / WBC   ENDOCRINE A:   Diabetes  P:   CBG every 4 with sliding scale insulin   NEUROLOGIC A:   Acute encephalopathy - suspect metabolic with narcotics plus acute on chronic kidney and liver disease.  Sedation for PermCath on 4/9 some concern for Hepatic encephalopathy  P:   RASS goal: n/a Minimize sedating medications as able When necessary low-dose fentanyl for pain Increase lactulose to 30g TID, want to titrate to 2-3 loose BM per day   FAMILY  - Updates: Wife updated at bedside 4/10 per Dr. Jerral Ralph & Dr. Isaiah Serge  - Inter-disciplinary family meet or Palliative Care meeting due by: 4/17   Gust Rung, DO PGY-3 IMTS 01/19/2016, 7:57 AM [facility] [phone] or if no answer [phone]

## 2016-01-20 ENCOUNTER — Inpatient Hospital Stay (HOSPITAL_COMMUNITY): Payer: Medicare Other

## 2016-01-20 DIAGNOSIS — K7460 Unspecified cirrhosis of liver: Secondary | ICD-10-CM | POA: Diagnosis present

## 2016-01-20 DIAGNOSIS — I272 Other secondary pulmonary hypertension: Secondary | ICD-10-CM

## 2016-01-20 DIAGNOSIS — J9621 Acute and chronic respiratory failure with hypoxia: Secondary | ICD-10-CM

## 2016-01-20 DIAGNOSIS — N179 Acute kidney failure, unspecified: Secondary | ICD-10-CM

## 2016-01-20 DIAGNOSIS — J9620 Acute and chronic respiratory failure, unspecified whether with hypoxia or hypercapnia: Secondary | ICD-10-CM | POA: Diagnosis present

## 2016-01-20 DIAGNOSIS — R188 Other ascites: Secondary | ICD-10-CM | POA: Diagnosis present

## 2016-01-20 LAB — CBC
HCT: 29.6 % — ABNORMAL LOW (ref 39.0–52.0)
Hemoglobin: 9.4 g/dL — ABNORMAL LOW (ref 13.0–17.0)
MCH: 34.6 pg — AB (ref 26.0–34.0)
MCHC: 31.8 g/dL (ref 30.0–36.0)
MCV: 108.8 fL — AB (ref 78.0–100.0)
PLATELETS: 79 10*3/uL — AB (ref 150–400)
RBC: 2.72 MIL/uL — AB (ref 4.22–5.81)
RDW: 16.4 % — AB (ref 11.5–15.5)
WBC: 7.7 10*3/uL (ref 4.0–10.5)

## 2016-01-20 LAB — CBC WITH DIFFERENTIAL/PLATELET
Basophils Absolute: 0 10*3/uL (ref 0.0–0.1)
Basophils Relative: 0 %
Eosinophils Absolute: 0 10*3/uL (ref 0.0–0.7)
Eosinophils Relative: 0 %
HEMATOCRIT: 29.3 % — AB (ref 39.0–52.0)
HEMOGLOBIN: 9.5 g/dL — AB (ref 13.0–17.0)
LYMPHS ABS: 0.9 10*3/uL (ref 0.7–4.0)
Lymphocytes Relative: 10 %
MCH: 35.2 pg — AB (ref 26.0–34.0)
MCHC: 32.4 g/dL (ref 30.0–36.0)
MCV: 108.5 fL — ABNORMAL HIGH (ref 78.0–100.0)
MONOS PCT: 7 %
Monocytes Absolute: 0.7 10*3/uL (ref 0.1–1.0)
NEUTROS ABS: 7.8 10*3/uL — AB (ref 1.7–7.7)
NEUTROS PCT: 83 %
Platelets: 79 10*3/uL — ABNORMAL LOW (ref 150–400)
RBC: 2.7 MIL/uL — AB (ref 4.22–5.81)
RDW: 16.7 % — ABNORMAL HIGH (ref 11.5–15.5)
WBC: 9.5 10*3/uL (ref 4.0–10.5)

## 2016-01-20 LAB — GLUCOSE, CAPILLARY
GLUCOSE-CAPILLARY: 136 mg/dL — AB (ref 65–99)
Glucose-Capillary: 140 mg/dL — ABNORMAL HIGH (ref 65–99)
Glucose-Capillary: 128 mg/dL — ABNORMAL HIGH (ref 65–99)
Glucose-Capillary: 138 mg/dL — ABNORMAL HIGH (ref 65–99)

## 2016-01-20 LAB — APTT

## 2016-01-20 LAB — RENAL FUNCTION PANEL
Albumin: 3.4 g/dL — ABNORMAL LOW (ref 3.5–5.0)
Albumin: 3.4 g/dL — ABNORMAL LOW (ref 3.5–5.0)
Anion gap: 11 (ref 5–15)
Anion gap: 11 (ref 5–15)
BUN: 30 mg/dL — AB (ref 6–20)
BUN: 27 mg/dL — AB (ref 6–20)
CALCIUM: 8 mg/dL — AB (ref 8.9–10.3)
CHLORIDE: 99 mmol/L — AB (ref 101–111)
CO2: 25 mmol/L (ref 22–32)
CO2: 26 mmol/L (ref 22–32)
CREATININE: 3.58 mg/dL — AB (ref 0.61–1.24)
Calcium: 8.1 mg/dL — ABNORMAL LOW (ref 8.9–10.3)
Chloride: 98 mmol/L — ABNORMAL LOW (ref 101–111)
Creatinine, Ser: 3.37 mg/dL — ABNORMAL HIGH (ref 0.61–1.24)
GFR calc Af Amer: 20 mL/min — ABNORMAL LOW (ref >60)
GFR calc non Af Amer: 16 mL/min — ABNORMAL LOW (ref >60)
GFR calc non Af Amer: 17 mL/min — ABNORMAL LOW (ref >60)
GFR, EST AFRICAN AMERICAN: 18 mL/min — AB (ref >60)
GLUCOSE: 160 mg/dL — AB (ref 65–99)
GLUCOSE: 158 mg/dL — AB (ref 65–99)
POTASSIUM: 4.1 mmol/L (ref 3.5–5.1)
Phosphorus: 4.4 mg/dL (ref 2.5–4.6)
Phosphorus: 3.9 mg/dL (ref 2.5–4.6)
Potassium: 4.1 mmol/L (ref 3.5–5.1)
SODIUM: 134 mmol/L — AB (ref 135–145)
Sodium: 136 mmol/L (ref 135–145)

## 2016-01-20 LAB — HEPARIN LEVEL (UNFRACTIONATED): HEPARIN UNFRACTIONATED: 0.83 [IU]/mL — AB (ref 0.30–0.70)

## 2016-01-20 LAB — MAGNESIUM: MAGNESIUM: 2.6 mg/dL — AB (ref 1.7–2.4)

## 2016-01-20 MED ORDER — DEXTROSE 5 % IV SOLN
2.0000 g | INTRAVENOUS | Status: DC
  Administered 2016-01-20 – 2016-01-24 (×5): 2 g via INTRAVENOUS
  Filled 2016-01-20 (×6): qty 2

## 2016-01-20 MED ORDER — PRISMASOL BGK 4/2.5 32-4-2.5 MEQ/L IV SOLN
INTRAVENOUS | Status: DC
  Administered 2016-01-20 – 2016-01-25 (×29): via INTRAVENOUS_CENTRAL
  Filled 2016-01-20 (×46): qty 5000

## 2016-01-20 MED ORDER — DARBEPOETIN ALFA 100 MCG/0.5ML IJ SOSY
100.0000 ug | PREFILLED_SYRINGE | INTRAMUSCULAR | Status: DC
  Administered 2016-01-20: 100 ug via SUBCUTANEOUS
  Filled 2016-01-20 (×4): qty 0.5

## 2016-01-20 MED ORDER — STERILE WATER FOR INJECTION IV SOLN
INTRAVENOUS | Status: DC
  Filled 2016-01-20 (×2): qty 850

## 2016-01-20 MED ORDER — THROMBI-PAD 3"X3" EX PADS
1.0000 | MEDICATED_PAD | Freq: Once | CUTANEOUS | Status: AC
Start: 2016-01-20 — End: 2016-01-21
  Administered 2016-01-21: 1 via TOPICAL
  Filled 2016-01-20 (×2): qty 1

## 2016-01-20 NOTE — Progress Notes (Signed)
PULMONARY / CRITICAL CARE MEDICINE   Name: Maurice Paul MRN: [id number] DOB: 05/04/1946    ADMISSION DATE:  01/13/2016 CONSULTATION DATE:  01/17/16  REFERRING MD:  Dr. 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    The patient reported on admission that he had gained 4-5 lbs a week for the month prior to admission.  He also had been treated for LE cellulitis with antibiotics and una-boots without significant relief (most recently has been treated with Keflex).  Patient also reported an obvious swelling in his lower extremities and abdominal distention.  Initial ER evaluation concerning for congestive heart failure, elevated troponin and acute kidney injury. Labs-NA 133, K5.2, CR 2.82, troponin 0.25, WBC 7.4, Hgb 11.0, MCV 106.1 and platelets 104. Initial chest x-ray with vascular congestion but no frank pulmonary edema, small bilateral pleural effusions. The patient was admitted per [facility] hospitalist for further evaluation.  The patient was evaluated by nephrology and felt to have subacute renal insufficiency in the setting of liver disease, ACE inhibitor use and right heart failure. He ultimately underwent placement of a PermCath per Dr. Edilia Bo on 4/9.  He also was evaluated by Dr. Donnie Aho of cardiology for right heart failure of unclear etiology (right heart failure vs cirrhosis vs OSA/OHS vs PE or combination).    Of note, the patient recently had an EGD completed by Dr. Ewing Schlein on 3/29 which showed evidence of gastritis but no other  abnormality.  He previously was treated with Lasix and Aldactone.  The patient was noted to have altered mental status a.m. of 4/10.  During dialysis he was treated with narcotics for pain. After return from HD to the medical floor he was noted to be more altered and an ABG was obtained which reflected hypercarbic respiratory failure (acute on chronic). PCCM consulted for evaluation.    Patient's wife denies known history of snoring or periods of apnea. 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  SUBJECTIVE:  Reports feeling cold, he is on CVVHD, no other complaints Had 1 BM per nursing  VITAL SIGNS: BP 113/47 mmHg  Pulse 70  Temp(Src) 97.3 F (36.3 C) (Oral)  Resp 24  Ht  (1.778 m)  Wt 328 lb 11.3 oz (149.1 kg)  BMI 47.16 kg/m2  SpO2 98%  HEMODYNAMICS: CVP:  [27 mmHg] 27 mmHg  VENTILATOR SETTINGS:    INTAKE / OUTPUT: I/O last 3 completed shifts: In: 3276.7 [P.O.:650; I.V.:1581.7; NG/GT:245; IV Piggyback:800] Out: 4280 [Emesis/NG output:50; Other:4230]  PHYSICAL EXAMINATION: General:  Obese male lying in bed, NAD wrapped in blankets Neuro:  Awake and alert, oriented to person and place, moving all 4 extremities, mild asterixis  HEENT:  MM pink/moist, unable to appreciate JVD, L perm cath present, Right IJ with dried blood, supplemental O2 via [place] Cardiovascular:  s1s2 rrr, no m/r/g, palpable peripheral pulses Lungs:  Mild rales at bilateral bases, no wheezing Abdomen:  Obese/soft, distended but not tense, bsx4 hypoactive  Musculoskeletal:  No acute  deformities  Skin:  Warm/dry, BLE 3+ edema, chronic venous stasis dermatitis  LABS:  BMET  Recent Labs Lab 01/19/16 0525 01/19/16 1533 01/20/16 0417  NA 136 135 134*  K 5.2* 4.7 4.1  CL 99* 98* 98*  CO2 25 26 25   BUN 33* 33* 30*  CREATININE 4.09* 3.83* 3.58*  GLUCOSE 186* 187* 160*    Electrolytes  Recent Labs Lab 01/18/16 0550  01/19/16 0525 01/19/16 1533 01/20/16 0417  CALCIUM 8.7*   < > 8.3* 8.1* 8.0*  MG 2.4  --  2.4  --  2.6*  PHOS 6.9*  < > 5.8* 5.0* 4.4  < > = values in this interval not displayed.  CBC  Recent Labs Lab 01/18/16 0550 01/19/16 0525 01/20/16 0417  WBC 17.8* 8.7 7.7  HGB 10.8* 10.2* 9.4*  HCT 33.4* 31.4* 29.6*  PLT 151 98* 79*    Coag's  Recent Labs Lab 01/14/16 0025 01/17/16 2320 01/19/16 0525 01/20/16 0417  APTT  --   --  33 >200*  INR 1.46 1.57*  --   --     Sepsis Markers  Recent Labs Lab 01/17/16 1423 01/17/16 1819 01/19/16 0525  LATICACIDVEN 1.9 2.1*  --   PROCALCITON 0.36  --  0.53    ABG  Recent Labs Lab 01/17/16 1435 01/17/16 1831  PHART 7.153* 7.177*  PCO2ART 79.0* 75.3*  PO2ART 61.6* 77.0*    Liver Enzymes  Recent Labs Lab 01/14/16 0025 01/14/16 0633  01/17/16 1838  01/19/16 0525 01/19/16 1533 01/20/16 0417  AST 46* 41  --  41  --   --   --   --   ALT 24 23  --  22  --   --   --   --   ALKPHOS 64 56  --  57  --   --   --   --   BILITOT 2.1* 2.1*  --  1.5*  --   --   --   --   ALBUMIN 3.7 3.4*  < > 3.5  < > 3.6 3.5 3.4*  < > = values in this interval not displayed.  Cardiac Enzymes  Recent Labs Lab 01/14/16 0259 01/14/16 0633 01/17/16 1838  TROPONINI 0.30* 0.25* 0.29*    Glucose  Recent Labs Lab 01/19/16 0356 01/19/16 0743 01/19/16 1124 01/19/16 1535 01/19/16 1952 01/19/16 2213  GLUCAP 180* 180* 170* 160* 192* 160*    Imaging Dg Chest Port 1 View  01/20/2016  CLINICAL DATA:  Respiratory failure. EXAM: PORTABLE CHEST 1 VIEW COMPARISON:  01/17/2016. FINDINGS: Right IJ line, left IJ dual-lumen catheter, NG tube in stable position. Persistent cardiomegaly with persistent mild interstitial prominence and bilateral effusions. Findings consistent with congestive heart failure. No interim change. No pneumothorax. IMPRESSION: 1. Lines and tubes in stable position. 2. Persistent cardiomegaly with bilateral from interstitial prominence and bilateral effusions, no interim change.  Findings consistent with mild congestive heart failure. Electronically Signed   By: Maisie Fus  Register   On: 01/20/2016 07:46     STUDIES:  4/07  LE Doppler >> no evidence of DVT bilaterally 4/07  ABD [place] >> normal amount of ascites, hepatic cirrhosis 4/07  Renal US >> small right renal cyst, otherwise kidneys unremarkable 4/08  ECHO >> EF 60-65%, normal wall motion, mild systolic flattening of ventricular septum c/w RV pressure overload, mild MR, LA moderately dilated, RV mildly dilated, RA mod to severely dilated, moderate TR, PA Peak 45 mm Hg 4/13 [place] Abd Doppler: Patent portal veins, perihepatic  ascites.   CULTURES: BCx2 4/10 >> NGTD UA 4/10 >> TNTC RBC, 6-30 WBC, Nitrite neg UC 4/10 >> NG at 24 hours Sputum not collected  ANTIBIOTICS: Vanco 4/10 >> 4/12 Zosyn 4/10 >> 4/13 Ceftriaxone 4/13>>  SIGNIFICANT EVENTS: 4/06  Admit with weight gain, swelling   LINES/TUBES: R IJ CVC 4/10 >>  Portacath 4/9>>  DISCUSSION: 70 year old male admitted 4/6 with dyspnea and weight gain. Found to have subacute renal failure & CHF - ? In setting of liver disease, ? Decompensated cor pulmonale.  He underwent placement of a PermCath on 4/9 (local + sedation).  During dialysis (2 L removed) on 4/10 he also received narcotics for pain.  ? If acute mental status changes are related to medications in the setting of poor renal/hepatic clearance.   He later developed nausea and vomiting. Concern for possible aspiration but no overt infiltrate post vomiting on CXR.   ASSESSMENT / PLAN:  PULMONARY A: Acute on chronic hypercarbic respiratory failure -  Pulmonary hypertension - PA peak 45 on echo, large right heart border noted on chest x-ray ? Decompensated cor pulmonale,  ? Hepatopulmonary syndrome ? OSA / OHS P:   Oxygen to support saturations > 90% currently on 1.5L supplemental O2 via [place] If patient decompensates will need intubation Pulmonary hygiene-IS, mobilize as able Monitor serial CXR's with  history of vomiting Lower extremity dopplers negative, defer V/q scan for now as he is on CVVHD.  Will need outpatient sleep study Will need Contrast Echo with bubble study to eval for Hepatopulmonary syndrome Volume removal as allowed.  CARDIOVASCULAR A:  Shock, presumed septic. ? Contribution liver disease, R heart failure Hypertension PVD / chronic venous stasis P:  Cardiology following, appreciate input Fluid balance per nephrology, appears to be total body volume overloaded with CVP of 27.   MAP goal > 65 can start to trite pressors down Abx empiric as below  RENAL A:   Acute Renal Failure - noted approximately 2 months prior to admission, on ACE, renal ultrasound negative for hydronephrosis.  Oliguria with hyperkalemia, HD initiated 4/10 Hyperkalemia- improving P:   Hemodialysis per nephrology, goal transition to IHD when feasible, unclear of long term goals with dialysis if no renal improvement Trend BMP / UOP Replace electrolytes as indicated PermCath care  GASTROINTESTINAL A:   Morbid obesity Nausea / vomiting  P:   Nothing by mouth  See ID U/S with Duplex shows no portal venous thrombus  HEMATOLOGIC A:   Macrocytic anemia  Thrombocytopenia  Leukocytosis - resolved with broad spectrum ABx R/O PE - unable to complete CTA due to CAD, lower extremity doppler negative P:  Trend CBC, platelets Monitor for bleeding  Continue heparin drip for now, cannot rule out PE >>> was called by nursing around 1pm bleeding from previous ART line and central line, D/C heparin drip.    INFECTIOUS A:   Sepsis- possible urinary source Possible SBP  P:   Assess cultures as above  change to Ceftriaxone will cover SBP or urinary source. Monitor fever curve / WBC - has trended down  ENDOCRINE A:   Diabetes  P:   CBG every 4 with sliding scale insulin   NEUROLOGIC A:   Acute encephalopathy - suspect metabolic with narcotics plus acute on chronic kidney and liver disease.   Sedation for PermCath on 4/9 some concern for Hepatic encephalopathy  P:   RASS goal: n/a Minimize sedating medications as able When necessary low-dose fentanyl for pain lactulose to 30g TID, want to  titrate to 2-3 loose BM per day   FAMILY  - Updates: Wife updated at bedside 4/13, had long conversation about GOC.  He has not filled out an Advance directive although had planned to.  A few days ago he had told her he would not want to "be hooked up to machines" for life support, although she is not sure he was thinking clearly then.  She knows that long term care in the hospital would not be consistent with his GOC and that short term support and potentially intubation and mechanical ventilation would be consistent.  She is not sure about long term dialysis.  We did discuss his code status and that if he were to survive cardiopulmonary resuscitation in his current condition that he would unlikely be able to achieve an independent lifestyle again.  She was distressed by this but does not think that she could bring herself to a decision to limit cardiopulmonary resuscitation at this time.  She would like to speak to their children tonight about that decision.  - Inter-disciplinary family meet or Palliative Care meeting due by: 4/17   Gust Rung, DO PGY-3 IMTS 01/20/2016, 8:48 AM [facility] [phone] or if no answer [phone]

## 2016-01-20 NOTE — Progress Notes (Signed)
Noted that pts central line was oozing blood around insertion site. Also upon further inspection pt had bleeding and a new hematoma on RIGHT wrist where previous removed A-line site that was removed on 01/18/16 around 1000. New light bruising also noted on pts tunneled HD cath site.  Pharmacy and Dr Mikey BussingHoffman notified and made aware. Per Dr Mikey BussingHoffman stop IV Heparin. Pharmacy made aware.  5lb pressure bags placed on both central line and RIGHT wrist where previous A-line had been. Pts RIGHT hand warm to touch, no decrease in feeling/sensation, capillary refill < 3 seconds. Will continue to monitor sites closely.  Wife at bedside and aware.

## 2016-01-20 NOTE — Progress Notes (Signed)
Pharmacy Antibiotic Note  Maurice BaizeWalter S Rafter is a 70 y.o. male admitted on 01/13/2016 with pnemonia.  Pharmacy has been consulted for ceftriaxone dosing. Pt is on CRRT.  Plan: Ceftriaxone IV 2g q24h F/u CRRT plans, C&S, clinical course  Height: 5\' 10"  (177.8 cm) Weight: (!) 328 lb 11.3 oz (149.1 kg) IBW/kg (Calculated) : 73  Temp (24hrs), Avg:97.7 F (36.5 C), Min:97.3 F (36.3 C), Max:97.9 F (36.6 C)   Recent Labs Lab 01/17/16 1423 01/17/16 1819 01/17/16 1838 01/18/16 0550 01/18/16 1511 01/19/16 0525 01/19/16 1533 01/20/16 0417  WBC 18.2*  --  16.3* 17.8*  --  8.7  --  7.7  CREATININE 5.71*  --  5.65* 5.04* 4.74* 4.09* 3.83* 3.58*  LATICACIDVEN 1.9 2.1*  --   --   --   --   --   --     Estimated Creatinine Clearance: 27.7 mL/min (by C-G formula based on Cr of 3.58).    Allergies  Allergen Reactions  . Sulfa Antibiotics     Hives  . Onglyza [Saxagliptin] Rash    Antimicrobials and cultures this admission: 4/10 Vanco>> 4/12 4/10 Zosyn>> 4/12 4/12 ceftriaxone>>  4/10 UCx>>ngtd 4/10 BCx2>> ngtd MRSA PCR neg  Thank you for allowing pharmacy to be a part of this patient's care.  Sandi CarneNick Leanna Hamid, PharmD Pharmacy Resident Pager: [phone] 01/20/2016 11:53 AM

## 2016-01-20 NOTE — Progress Notes (Signed)
ANTICOAGULATION CONSULT NOTE - Follow Up Consult  Pharmacy Consult for Heparin  Indication: Hx VTE, rule out PE  Allergies  Allergen Reactions  . Sulfa Antibiotics     Hives  . Onglyza [Saxagliptin] Rash   Patient Measurements: Height: 5\' 10"  (177.8 cm) Weight: (!) 340 lb 13.3 oz (154.6 kg) IBW/kg (Calculated) : 73  Vital Signs: Temp: 97.9 F (36.6 C) (04/13 0345) Temp Source: Oral (04/13 0345) BP: 101/49 mmHg (04/13 0445) Pulse Rate: 72 (04/13 0445)  Labs:  Recent Labs  01/17/16 1838 01/17/16 2320 01/18/16 0550  01/19/16 0525 01/19/16 1533 01/19/16 2007 01/20/16 0417  HGB 10.7*  --  10.8*  --  10.2*  --   --  9.4*  HCT 33.9*  --  33.4*  --  31.4*  --   --  29.6*  PLT 91*  --  151  --  98*  --   --  79*  APTT  --   --   --   --  33  --   --   --   LABPROT  --  18.8*  --   --   --   --   --   --   INR  --  1.57*  --   --   --   --   --   --   HEPARINUNFRC  --   --   --   --   --   --  0.55 0.83*  CREATININE 5.65*  --  5.04*  < > 4.09* 3.83*  --  3.58*  TROPONINI 0.29*  --   --   --   --   --   --   --   < > = values in this interval not displayed.  Estimated Creatinine Clearance: 28.3 mL/min (by C-G formula based on Cr of 3.58).  Assessment: Heparin level this AM is supra-therapeutic, no issues per RN  Goal of Therapy:  Heparin level 0.3-0.7 units/ml Monitor platelets by anticoagulation protocol: Yes   Plan:  -Decrease heparin to 1550 units/hr -1330 HL  Abran DukeLedford, Karlin Binion 01/20/2016,5:06 AM

## 2016-01-20 NOTE — Progress Notes (Signed)
Subjective:  Continues on hemodialysis. More alert today without SOB.  BP a little better today.     Objective:  Vital Signs in the last 24 hours: BP 104/51 mmHg  Pulse 73  Temp(Src) 97.3 F (36.3 C) (Oral)  Resp 14  Ht 5\' 10"  (1.778 m)  Wt 149.1 kg (328 lb 11.3 oz)  BMI 47.16 kg/m2  SpO2 94%  Physical Exam: Lethargic, but awake  obese white male currently in no acute distress Lungs:  Reduced BS  Cardiac:  Regular rhythm, normal S1 and S2, no S3, no murmur Abdomen: Quite distended Extremities:  4+ edema noted with brawny erythema extending all the way up the lower extremities  Intake/Output from previous day: 04/12 0701 - 04/13 0700 In: 2088.2 [P.O.:650; I.V.:993.2; NG/GT:245; IV Piggyback:200] Out: 3327  Weight Filed Weights   01/18/16 0500 01/19/16 0428 01/20/16 0500  Weight: 155 kg (341 lb 11.4 oz) 154.6 kg (340 lb 13.3 oz) 149.1 kg (328 lb 11.3 oz)    Lab Results: Basic Metabolic Panel:  Recent Labs  16/07/9603/12/17 1533 01/20/16 0417  NA 135 134*  K 4.7 4.1  CL 98* 98*  CO2 26 25  GLUCOSE 187* 160*  BUN 33* 30*  CREATININE 3.83* 3.58*    CBC:  Recent Labs  01/17/16 1838  01/19/16 0525 01/20/16 0417  WBC 16.3*  < > 8.7 7.7  NEUTROABS 14.5*  --   --   --   HGB 10.7*  < > 10.2* 9.4*  HCT 33.9*  < > 31.4* 29.6*  MCV 111.9*  < > 109.4* 108.8*  PLT 91*  < > 98* 79*  < > = values in this interval not displayed. PROTIME: Lab Results  Component Value Date   INR 1.57* 01/17/2016   INR 1.46 01/14/2016   INR 1.10 12/24/2013    Telemetry: Normal sinus rhythm  Assessment/Plan:  1.  Right heart failure and hypotension with pul hypertension.  D dimer is up and PE not eliminated completely although legs negative for DVT. Other possibilities are portal hypotension, obesity hypoventilation 2.  Prior diagnosis of cirrhosis questionable etiology but evidently has refused liver biopsy 3. Subacute renal failure progressive now on dialysis. 4.  Right heart failure  unclear etiology.  Unclear whether this is due to morbid obesity, subacute pulmonary emboli, or sleep apnea.   5.  Thrombocytopenia, macrocytosis, mildly low albumin and evidence of cirrhosis of which could be markers of cirrhosis  Recommendations:  Nothing to add from cardiology viewpoint.  PE still in the background, but difficult to know. No other thoughts at this time.    Darden PalmerW. Spencer Andrian Urbach, Jr.  MD [facility], LPFACC Cardiology  01/20/2016, 9:17 AM

## 2016-01-20 NOTE — Progress Notes (Signed)
Subjective:   Remains confused overnight- CRRT running well- pressors being weaned and have managed to pull a little volume - negative 1.3 liters   Objective Vital signs in last 24 hours: Filed Vitals:   01/20/16 0545 01/20/16 0600 01/20/16 0615 01/20/16 0630  BP: 83/41 80/34 86/34  88/43  Pulse: 66 65 69 67  Temp:      TempSrc:      Resp: 16 12 12 12   Height:      Weight:      SpO2: 94% 93% 93% 93%   Weight change: -5.5 kg (-12 lb 2 oz)  Intake/Output Summary (Last 24 hours) at 01/20/16 0706 Last data filed at 01/20/16 0700  Gross per 24 hour  Intake 2088.2 ml  Output   3327 ml  Net -1238.8 ml    Assessment/ Plan: Pt is a 70 y.o. yo male who was admitted on 01/13/2016 with volume overload - thought due to cirrhosis and right sided heart failure- had fairly normal creatinine in Feb but progressive renal failure since- had to initiate HD due to oligoanuria and climbing creatinine then with clinical decline now on CRRT  Assessment/Plan: 1. Renal- subacute picture of renal failure with most notably volume overload refractory to diuretics- req initiation of HD 4/10- then needed CRRT shortly thereafter due to hemodynamic instability.  U/S normal- he does have proteinuria at 3 grams but no hematuria- low plts but heme not susicious for TTP/HUS type picture.  Working diagnosis right now is hemodynamically mediated kidney injury?   Now with clinical decline of unknown etiology- requiring CRRT.  Anuric 2. HTN/vol- now on pressors but being weaned - given CVP in the 20's and pitting edema will continue some volume removal  3. Anemia- hgb 9.4- iron is OK - will add ESA  4. Hyperkalemia- has been persistent- now finally making a move in the right direction- change to all 4 K bath 5. Hypoxia- supposedly fairly clear CXR- cards to work up for possible PE- has not required intubation which is actually pretty surprising 6. Sepsis- thought to be etiology of decline- on zosyn/vanc - WBC better -  hemodynamics improving     Ara Mano A    Labs: Basic Metabolic Panel:  Recent Labs Lab 01/19/16 0525 01/19/16 1533 01/20/16 0417  NA 136 135 134*  K 5.2* 4.7 4.1  CL 99* 98* 98*  CO2 25 26 25   GLUCOSE 186* 187* 160*  BUN 33* 33* 30*  CREATININE 4.09* 3.83* 3.58*  CALCIUM 8.3* 8.1* 8.0*  PHOS 5.8* 5.0* 4.4   Liver Function Tests:  Recent Labs Lab 01/14/16 0025 01/14/16 0633  01/17/16 1838  01/19/16 0525 01/19/16 1533 01/20/16 0417  AST 46* 41  --  41  --   --   --   --   ALT 24 23  --  22  --   --   --   --   ALKPHOS 64 56  --  57  --   --   --   --   BILITOT 2.1* 2.1*  --  1.5*  --   --   --   --   PROT 8.2* 7.3  --  7.7  --   --   --   --   ALBUMIN 3.7 3.4*  < > 3.5  < > 3.6 3.5 3.4*  < > = values in this interval not displayed. No results for input(s): LIPASE, AMYLASE in the last 168 hours.  Recent Labs Lab 01/14/16 0020 01/17/16 1423  01/17/16 1839  AMMONIA 66* 41* 36*   CBC:  Recent Labs Lab 01/14/16 0633  01/17/16 1423 01/17/16 1838 01/18/16 0550 01/19/16 0525 01/20/16 0417  WBC 6.9  < > 18.2* 16.3* 17.8* 8.7 7.7  NEUTROABS 5.2  --   --  14.5*  --   --   --   HGB 9.6*  < > 10.6* 10.7* 10.8* 10.2* 9.4*  HCT 30.3*  < > 33.5* 33.9* 33.4* 31.4* 29.6*  MCV 105.2*  < > 111.3* 111.9* 109.2* 109.4* 108.8*  PLT 104*  < > 96* 91* 151 98* 79*  < > = values in this interval not displayed. Cardiac Enzymes:  Recent Labs Lab 01/13/16 2236 01/14/16 0259 01/14/16 0633 01/17/16 1838  TROPONINI 0.25* 0.30* 0.25* 0.29*   CBG:  Recent Labs Lab 01/19/16 0743 01/19/16 1124 01/19/16 1535 01/19/16 1952 01/19/16 2213  GLUCAP 180* 170* 160* 192* 160*    Iron Studies: No results for input(s): IRON, TIBC, TRANSFERRIN, FERRITIN in the last 72 hours. Studies/Results: No results found. Medications: Infusions: . heparin 1,550 Units/hr (01/20/16 0700)  . norepinephrine (LEVOPHED) Adult infusion    . dialysate (PRISMASATE) 1,500 mL/hr at  01/20/16 0537  . dialysis replacement fluid (prismasate) 300 mL/hr at 01/19/16 2306  . dialysis replacement fluid (prismasate) 300 mL/hr at 01/18/16 1339  . vasopressin (PITRESSIN) infusion - *FOR SHOCK* Stopped (01/20/16 0200)    Scheduled Medications: . hydrocortisone sod succinate (SOLU-CORTEF) inj  50 mg Intravenous Q6H  . insulin aspart  0-5 Units Subcutaneous QHS  . insulin aspart  0-9 Units Subcutaneous TID WC  . ipratropium-albuterol  3 mL Nebulization QID  . lactulose  30 g Per Tube TID  . lactulose  300 mL Rectal Once  . pantoprazole (PROTONIX) IV  40 mg Intravenous Q24H  . piperacillin-tazobactam (ZOSYN)  IV  2.25 g Intravenous Q6H  . sodium chloride flush  3 mL Intravenous Q12H    have reviewed scheduled and prn medications.  Physical Exam: General: confused Heart: RRR Lungs: no rales Abdomen: obese Extremities: pitting edema Dialysis Access: left PC placed 4/9    01/20/2016,7:06 AM  LOS: 7 days

## 2016-01-21 LAB — RENAL FUNCTION PANEL
ALBUMIN: 3.5 g/dL (ref 3.5–5.0)
ALBUMIN: 3.4 g/dL — AB (ref 3.5–5.0)
ANION GAP: 10 (ref 5–15)
ANION GAP: 12 (ref 5–15)
BUN: 26 mg/dL — AB (ref 6–20)
BUN: 25 mg/dL — ABNORMAL HIGH (ref 6–20)
CALCIUM: 8.3 mg/dL — AB (ref 8.9–10.3)
CO2: 26 mmol/L (ref 22–32)
CO2: 24 mmol/L (ref 22–32)
CREATININE: 3.27 mg/dL — AB (ref 0.61–1.24)
Calcium: 8.1 mg/dL — ABNORMAL LOW (ref 8.9–10.3)
Chloride: 100 mmol/L — ABNORMAL LOW (ref 101–111)
Chloride: 98 mmol/L — ABNORMAL LOW (ref 101–111)
Creatinine, Ser: 3.12 mg/dL — ABNORMAL HIGH (ref 0.61–1.24)
GFR calc Af Amer: 20 mL/min — ABNORMAL LOW (ref >60)
GFR calc Af Amer: 22 mL/min — ABNORMAL LOW (ref >60)
GFR calc non Af Amer: 18 mL/min — ABNORMAL LOW (ref >60)
GFR calc non Af Amer: 19 mL/min — ABNORMAL LOW (ref >60)
GLUCOSE: 154 mg/dL — AB (ref 65–99)
GLUCOSE: 180 mg/dL — AB (ref 65–99)
PHOSPHORUS: 3.5 mg/dL (ref 2.5–4.6)
PHOSPHORUS: 2.9 mg/dL (ref 2.5–4.6)
POTASSIUM: 3.9 mmol/L (ref 3.5–5.1)
Potassium: 4.2 mmol/L (ref 3.5–5.1)
SODIUM: 136 mmol/L (ref 135–145)
Sodium: 134 mmol/L — ABNORMAL LOW (ref 135–145)

## 2016-01-21 LAB — HEPARIN LEVEL (UNFRACTIONATED): Heparin Unfractionated: <0.1 IU/mL — ABNORMAL LOW (ref 0.30–0.70)

## 2016-01-21 LAB — GLUCOSE, CAPILLARY
GLUCOSE-CAPILLARY: 152 mg/dL — AB (ref 65–99)
GLUCOSE-CAPILLARY: 172 mg/dL — AB (ref 65–99)
Glucose-Capillary: 169 mg/dL — ABNORMAL HIGH (ref 65–99)
Glucose-Capillary: 150 mg/dL — ABNORMAL HIGH (ref 65–99)

## 2016-01-21 LAB — CBC
HEMATOCRIT: 30.4 % — AB (ref 39.0–52.0)
HEMOGLOBIN: 9.7 g/dL — AB (ref 13.0–17.0)
MCH: 34.5 pg — AB (ref 26.0–34.0)
MCHC: 31.9 g/dL (ref 30.0–36.0)
MCV: 108.2 fL — AB (ref 78.0–100.0)
Platelets: 81 10*3/uL — ABNORMAL LOW (ref 150–400)
RBC: 2.81 MIL/uL — ABNORMAL LOW (ref 4.22–5.81)
RDW: 16.5 % — AB (ref 11.5–15.5)
WBC: 11.2 10*3/uL — ABNORMAL HIGH (ref 4.0–10.5)

## 2016-01-21 LAB — PROCALCITONIN: PROCALCITONIN: 0.25 ng/mL

## 2016-01-21 LAB — MAGNESIUM: Magnesium: 2.6 mg/dL — ABNORMAL HIGH (ref 1.7–2.4)

## 2016-01-21 LAB — APTT: aPTT: 35 seconds (ref 24–37)

## 2016-01-21 NOTE — Progress Notes (Signed)
OT Cancellation Note  Patient Details Name: Maurice BaizeWalter S Paul MRN: [id number] DOB: 06/17/1946   Cancelled Treatment:    Reason Eval/Treat Not Completed: Patient not medically ready - Pt has had multiple medical issues preventing his ability to participe in OT.  Now ruling out PE, and continues on CRRT.   Will sign off at this time.  Please re-order when medically able to participate  Angelene GiovanniConarpe, Elazar Argabright M  Deyana Wnuk Berwynonarpe, OTR/L [phone]  01/21/2016, 4:14 PM

## 2016-01-21 NOTE — Progress Notes (Signed)
Subjective:  More alert today.  Starting to eat a little now. No SOB or chest pain.      Objective:  Vital Signs in the last 24 hours: BP 102/48 mmHg  Pulse 76  Temp(Src) 97.6 F (36.4 C) (Oral)  Resp 17  Ht 5\' 10"  (1.778 m)  Wt 148.6 kg (327 lb 9.7 oz)  BMI 47.01 kg/m2  SpO2 95%  Physical Exam: Awake obese white male currently in no acute distress Lungs:  Reduced BS  Cardiac:  Regular rhythm, normal S1 and S2, no S3, no murmur Extremities:  4+ edema noted with brawny erythema extending all the way up the lower extremities  Intake/Output from previous day: 04/13 0701 - 04/14 0700 In: 1306 [P.O.:330; I.V.:756; NG/GT:110; IV Piggyback:100] Out: 3472 [Stool:1] Weight Filed Weights   01/19/16 0428 01/20/16 0500 01/21/16 0331  Weight: 154.6 kg (340 lb 13.3 oz) 149.1 kg (328 lb 11.3 oz) 148.6 kg (327 lb 9.7 oz)    Lab Results: Basic Metabolic Panel:  Recent Labs  96/01/5403/13/17 1545 01/21/16 0500  NA 136 136  K 4.1 4.2  CL 99* 100*  CO2 26 26  GLUCOSE 158* 154*  BUN 27* 26*  CREATININE 3.37* 3.27*    CBC:  Recent Labs  01/20/16 1546 01/21/16 0432  WBC 9.5 11.2*  NEUTROABS 7.8*  --   HGB 9.5* 9.7*  HCT 29.3* 30.4*  MCV 108.5* 108.2*  PLT 79* 81*   PROTIME: Lab Results  Component Value Date   INR 1.57* 01/17/2016   INR 1.46 01/14/2016   INR 1.10 12/24/2013    Telemetry: Normal sinus rhythm  Assessment/Plan:  1.  Right heart failure and hypotension with pul hypertension.  D dimer is up and PE not eliminated completely although legs negative for DVT. Other possibilities are portal hypotension, obesity hypoventilation.  Possibility of shunting discussed with patient 2.  Prior diagnosis of cirrhosis questionable etiology  3. Subacute renal failure now on dialysis with some volume removed 4.  Right heart failure unclear etiology.  Unclear whether this is due to morbid obesity, subacute pulmonary emboli, or sleep apnea.   5.  Thrombocytopenia, macrocytosis,  mildly low albumin and evidence of cirrhosis of which could be markers of cirrhosis  Recommendations:  A bubble study is reasonable although he doesn't have severe hypoxemia that you would suspect at this time.    Darden PalmerW. Spencer Tilley, Jr.  MD [facility] Cardiology  01/21/2016, 9:01 AM

## 2016-01-21 NOTE — Care Management Important Message (Signed)
Important Message  Patient Details  Name: Maurice Paul MRN: [id number] Date of Birth: 02/15/1946   Medicare Important Message Given:  Yes    [name] Abena 01/21/2016, 11:01 AM
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
## 2016-01-21 NOTE — Progress Notes (Signed)
PT Cancellation and Discharge Note  Patient Details Name: Jewel BaizeWalter S Raden MRN: [id number] DOB: 10/13/1945   Cancelled Treatment:    Reason Eval/Treat Not Completed: Patient not medically ready.  Noted now ruling out PE.  Will sign off PT at this time and need new order once appropriate for PT and mobility.     Vela Render, Alison MurrayMegan F 01/21/2016, 2:35 PM

## 2016-01-21 NOTE — Progress Notes (Signed)
Subjective:   Pressors appeared to have been weaned- but now back up ? this AM- removed 2 liters with CRRT- clinically a little better  Objective Vital signs in last 24 hours: Filed Vitals:   01/21/16 0400 01/21/16 0504 01/21/16 0600 01/21/16 0700  BP: 103/45 108/49 94/41 129/51  Pulse: 72 75 71 78  Temp: 97.5 F (36.4 C)     TempSrc: Oral     Resp: Height:      Weight:      SpO2: 96% 97% 96% 97%   Weight change: -0.5 kg (-1 lb 1.6 oz)  Intake/Output Summary (Last 24 hours) at 01/21/16 0741 Last data filed at 01/21/16 0700  Gross per 24 hour  Intake 1305.98 ml  Output   3472 ml  Net -2166.02 ml    Assessment/ Plan: Pt is a 70 y.o. yo male who was admitted on 01/13/2016 with volume overload - thought due to cirrhosis and right sided heart failure- had fairly normal creatinine in Feb but progressive renal failure since- had to initiate HD due to oligoanuria and climbing creatinine then with clinical decline now on CRRT  Assessment/Plan: 1. Renal- subacute picture of renal failure with most notably volume overload refractory to diuretics- req initiation of HD 4/10- then needed CRRT shortly thereafter due to hemodynamic instability.  U/S normal- he does have proteinuria at 3 grams but no hematuria- low plts but heme not susicious for TTP/HUS type picture.  Working diagnosis right now is hemodynamically mediated kidney injury?   Now with clinical decline of unknown etiology- requiring CRRT.  Anuric.  Not really that optimistic regarding renal recovery, continue support for now but not sure he is the best long term chronic hemodialysis candidate 2. HTN/vol- now on pressors but being weaned - given CVP in the 20's and pitting edema will continue some volume removal  3. Anemia- hgb 9.4- iron is OK - have added ESA  4. Hyperkalemia- resolved 5. Hypoxia- supposedly fairly clear CXR- cards to work up for possible PE- has not required intubation which is actually pretty surprising 6.  Sepsis- thought to be etiology of decline- on zosyn/vanc - WBC better - hemodynamics improving - deescalate abx per CCM    Alexsa Flaum A    Labs: Basic Metabolic Panel:  Recent Labs Lab 01/20/16 0417 01/20/16 1545 01/21/16 0500  NA 134* 136 136  K 4.1 4.1 4.2  CL 98* 99* 100*  CO2 GLUCOSE 160* 158* 154*  BUN 30* 27* 26*  CREATININE 3.58* 3.37* 3.27*  CALCIUM 8.0* 8.1* 8.3*  PHOS 4.4 3.9 3.5   Liver Function Tests:  Recent Labs Lab 01/17/16 1838  01/20/16 0417 01/20/16 1545 01/21/16 0500  AST 41  --   --   --   --   ALT 22  --   --   --   --   ALKPHOS 57  --   --   --   --   BILITOT 1.5*  --   --   --   --   PROT 7.7  --   --   --   --   ALBUMIN 3.5  < > 3.4* 3.4* 3.5  < > = values in this interval not displayed. No results for input(s): LIPASE, AMYLASE in the last 168 hours.  Recent Labs Lab 01/17/16 1423 01/17/16 1839  AMMONIA 41* 36*   CBC:  Recent Labs Lab 01/17/16 1838 01/18/16 0550 01/19/16 0525 01/20/16 0417 01/20/16 1546  01/21/16 0432  WBC 16.3* 17.8* 8.7 7.7 9.5 11.2*  NEUTROABS 14.5*  --   --   --  7.8*  --   HGB 10.7* 10.8* 10.2* 9.4* 9.5* 9.7*  HCT 33.9* 33.4* 31.4* 29.6* 29.3* 30.4*  MCV 111.9* 109.2* 109.4* 108.8* 108.5* 108.2*  PLT 91* 151 98* 79* 79* 81*   Cardiac Enzymes:  Recent Labs Lab 01/17/16 1838  TROPONINI 0.29*   CBG:  Recent Labs Lab 01/19/16 2213 01/20/16 0750 01/20/16 1209 01/20/16 1523 01/20/16 2029  GLUCAP 160* 140* 128* 138* 136*    Iron Studies: No results for input(s): IRON, TIBC, TRANSFERRIN, FERRITIN in the last 72 hours. Studies/Results: [place] Art/ven Flow Abd Pelv Doppler  01/20/2016  EXAM: DUPLEX ULTRASOUND OF LIVER TECHNIQUE: Color and duplex Doppler ultrasound was performed to evaluate the hepatic in-flow and out-flow vessels. COMPARISON:  None. FINDINGS: Portal Vein Velocities Main:  20-24 cm/sec Right:  20 cm/sec with hepatopetal flow Left:  62 cm/sec with hepatopetal flow  Hepatic Vein Velocities Right:  39 cm/sec Middle:  41 cm/sec Left:  35 cm/sec Hepatic Artery Velocity:  187 cm/sec Splenic Vein Velocity:  Unable to visualize Varices: None visualized Ascites: Perihepatic ascites. Technically challenging examination secondary to patient body habitus and current clinical condition. IMPRESSION: 1. Patent main, left and right portal veins with predominantly hepatopetal flow. 2. Flow velocities are slightly low in the main portal vein which can be a sign of underlying portal hypertension. 3. Small volume perihepatic ascites. 4. Patent hepatic veins and hepatic artery. Electronically Signed   By: Malachy MoanHeath  McCullough M.D.   On: 01/20/2016 11:05   Dg Chest Port 1 View  01/20/2016  CLINICAL DATA:  Respiratory failure. EXAM: PORTABLE CHEST 1 VIEW COMPARISON:  01/17/2016. FINDINGS: Right IJ line, left IJ dual-lumen catheter, NG tube in stable position. Persistent cardiomegaly with persistent mild interstitial prominence and bilateral effusions. Findings consistent with congestive heart failure. No interim change. No pneumothorax. IMPRESSION: 1. Lines and tubes in stable position. 2. Persistent cardiomegaly with bilateral from interstitial prominence and bilateral effusions, no interim change. Findings consistent with mild congestive heart failure. Electronically Signed   By: Maisie Fushomas  Register   On: 01/20/2016 07:46   Medications: Infusions: . norepinephrine (LEVOPHED) Adult infusion 5 mcg/min (01/21/16 0715)  . dialysis replacement fluid (prismasate) 300 mL/hr at 01/20/16 1608  . dialysis replacement fluid (prismasate) 300 mL/hr at 01/20/16 1749  . dialysate (PRISMASATE) 1,500 mL/hr at 01/21/16 0724  . vasopressin (PITRESSIN) infusion - *FOR SHOCK* Stopped (01/20/16 0200)    Scheduled Medications: . cefTRIAXone (ROCEPHIN)  IV  2 g Intravenous Q24H  . darbepoetin (ARANESP) injection - NON-DIALYSIS  100 mcg Subcutaneous Q Thu-1800  . hydrocortisone sod succinate (SOLU-CORTEF) inj   50 mg Intravenous Q6H  . insulin aspart  0-5 Units Subcutaneous QHS  . insulin aspart  0-9 Units Subcutaneous TID WC  . ipratropium-albuterol  3 mL Nebulization QID  . lactulose  30 g Per Tube TID  . lactulose  300 mL Rectal Once  . pantoprazole (PROTONIX) IV  40 mg Intravenous Q24H  . sodium chloride flush  3 mL Intravenous Q12H    have reviewed scheduled and prn medications.  Physical Exam: General: confused Heart: RRR Lungs: no rales Abdomen: obese Extremities: pitting edema Dialysis Access: left PC placed 4/9    01/21/2016,7:41 AM  LOS: 8 days

## 2016-01-22 LAB — RENAL FUNCTION PANEL
ALBUMIN: 3.5 g/dL (ref 3.5–5.0)
ANION GAP: 10 (ref 5–15)
Albumin: 3.4 g/dL — ABNORMAL LOW (ref 3.5–5.0)
Anion gap: 11 (ref 5–15)
BUN: 24 mg/dL — ABNORMAL HIGH (ref 6–20)
BUN: 23 mg/dL — ABNORMAL HIGH (ref 6–20)
CALCIUM: 8.5 mg/dL — AB (ref 8.9–10.3)
CALCIUM: 8.4 mg/dL — AB (ref 8.9–10.3)
CO2: 26 mmol/L (ref 22–32)
CO2: 23 mmol/L (ref 22–32)
CREATININE: 3.23 mg/dL — AB (ref 0.61–1.24)
CREATININE: 3.04 mg/dL — AB (ref 0.61–1.24)
Chloride: 101 mmol/L (ref 101–111)
Chloride: 99 mmol/L — ABNORMAL LOW (ref 101–111)
GFR calc Af Amer: 22 mL/min — ABNORMAL LOW (ref >60)
GFR calc non Af Amer: 19 mL/min — ABNORMAL LOW (ref >60)
GFR, EST AFRICAN AMERICAN: 21 mL/min — AB (ref >60)
GFR, EST NON AFRICAN AMERICAN: 18 mL/min — AB (ref >60)
GLUCOSE: 148 mg/dL — AB (ref 65–99)
Glucose, Bld: 152 mg/dL — ABNORMAL HIGH (ref 65–99)
PHOSPHORUS: 2.5 mg/dL (ref 2.5–4.6)
PHOSPHORUS: 2.6 mg/dL (ref 2.5–4.6)
POTASSIUM: 3.7 mmol/L (ref 3.5–5.1)
Potassium: 3.9 mmol/L (ref 3.5–5.1)
SODIUM: 137 mmol/L (ref 135–145)
SODIUM: 133 mmol/L — AB (ref 135–145)

## 2016-01-22 LAB — CBC
HEMATOCRIT: 29.3 % — AB (ref 39.0–52.0)
Hemoglobin: 9.3 g/dL — ABNORMAL LOW (ref 13.0–17.0)
MCH: 34.2 pg — ABNORMAL HIGH (ref 26.0–34.0)
MCHC: 31.7 g/dL (ref 30.0–36.0)
MCV: 107.7 fL — ABNORMAL HIGH (ref 78.0–100.0)
PLATELETS: 73 10*3/uL — AB (ref 150–400)
RBC: 2.72 MIL/uL — ABNORMAL LOW (ref 4.22–5.81)
RDW: 16.8 % — AB (ref 11.5–15.5)
WBC: 9 10*3/uL (ref 4.0–10.5)

## 2016-01-22 LAB — CULTURE, BLOOD (ROUTINE X 2)
CULTURE: NO GROWTH
CULTURE: NO GROWTH

## 2016-01-22 LAB — GLUCOSE, CAPILLARY
GLUCOSE-CAPILLARY: 141 mg/dL — AB (ref 65–99)
Glucose-Capillary: 152 mg/dL — ABNORMAL HIGH (ref 65–99)
Glucose-Capillary: 137 mg/dL — ABNORMAL HIGH (ref 65–99)
Glucose-Capillary: 141 mg/dL — ABNORMAL HIGH (ref 65–99)
Glucose-Capillary: 131 mg/dL — ABNORMAL HIGH (ref 65–99)

## 2016-01-22 LAB — MAGNESIUM: MAGNESIUM: 2.6 mg/dL — AB (ref 1.7–2.4)

## 2016-01-22 LAB — APTT: APTT: 35 s (ref 24–37)

## 2016-01-22 NOTE — Progress Notes (Signed)
PULMONARY / CRITICAL CARE MEDICINE   Name: Maurice Paul MRN: [id number] DOB: [birth year]    ADMISSION DATE:  01/13/2016 CONSULTATION DATE:  01/17/16  REFERRING MD:  Dr. 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    The patient reported on admission that he had gained 4-5 lbs a week for the month prior to admission.  He also had been treated for LE cellulitis with antibiotics and una-boots without significant relief (most recently has been treated with Keflex).  Patient also reported an obvious swelling in his lower extremities and abdominal distention.  Initial ER evaluation concerning for congestive heart failure, elevated troponin and acute kidney injury. Labs-NA 133, K5.2, CR 2.82, troponin 0.25, WBC 7.4, Hgb 11.0, MCV 106.1 and platelets 104. Initial chest x-ray with vascular congestion but no frank pulmonary edema, small bilateral pleural effusions. The patient was admitted per [facility] hospitalist for further evaluation.  The patient was evaluated by nephrology and felt to have subacute renal insufficiency in the setting of liver disease, ACE inhibitor use and right heart failure. He ultimately underwent placement of a PermCath per Dr. Edilia Bo on 4/9.  He also was evaluated by Dr. Donnie Aho of cardiology for right heart failure of unclear etiology (right heart failure vs cirrhosis vs OSA/OHS vs PE or combination).    Of note, the patient recently had an EGD completed by Dr. Ewing Schlein on 3/29 which showed evidence of gastritis but no other  abnormality.  He previously was treated with Lasix and Aldactone.  The patient was noted to have altered mental status a.m. of 4/10.  During dialysis he was treated with narcotics for pain. After return from HD to the medical floor he was noted to be more altered and an ABG was obtained which reflected hypercarbic respiratory failure (acute on chronic). PCCM consulted for evaluation.    Patient's wife denies known history of snoring or periods of apnea. 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  SUBJECTIVE:  Patient seems more confused today, he is calling out for help   VITAL SIGNS: BP 94/37 mmHg  Pulse 89  Temp(Src) 97.6 F (36.4 C) (Oral)  Resp 12  Ht  (1.778 m)  Wt 144.9 kg (319 lb 7.1 oz)  BMI 45.84 kg/m2  SpO2 100%  HEMODYNAMICS: CVP:  [21 mmHg] 21 mmHg  VENTILATOR SETTINGS:    INTAKE / OUTPUT: I/O last 3 completed shifts: In: 2025.7 [P.O.:975; I.V.:927.7; Other:13; NG/GT:60; IV Piggyback:50] Out: 5551 [Other:5350; Stool:201]  PHYSICAL EXAMINATION: General:  Obese male lying in bed, NAD wrapped in blankets Neuro:  Awake and alert, oriented to person and place, moving all 4 extremities, no appreciated asterixis  HEENT:  MM pink/moist, unable to appreciate JVD, L perm cath present, Right IJ clean with no bleeding, supplemental O2 via [place] Cardiovascular:  s1s2 rrr, no m/r/g, palpable peripheral pulses Lungs:  Mild rales at bilateral bases, no wheezing Abdomen:  Obese/soft, distended but not tense, bsx4 hypoactive  Musculoskeletal:  No acute deformities  Skin:  Warm/dry, BLE 3+ edema, chronic venous stasis dermatitis, hematoma of right wrist  LABS:  BMET  Recent Labs Lab 01/21/16 0500 01/21/16 1555 01/22/16 0512  NA 136 134* 137  K 4.2 3.9 3.9  CL 100* 98* 101  CO2 BUN 26* 25* 24*  CREATININE 3.27* 3.12* 3.23*  GLUCOSE 154* 180* 152*    Electrolytes  Recent Labs Lab 01/20/16 0417  01/21/16 0432 01/21/16 0500  01/21/16 1555 01/22/16 0512 01/22/16 0513  CALCIUM 8.0*  < >  --  8.3* 8.1* 8.5*  --   MG 2.6*  --  2.6*  --   --   --  2.6*  PHOS 4.4  < >  --  3.5 2.9 2.5  --   < > = values in this interval not displayed.  CBC  Recent Labs Lab 01/20/16 1546 01/21/16 0432 01/22/16 0513  WBC 9.5 11.2* 9.0  HGB 9.5* 9.7* 9.3*  HCT 29.3* 30.4* 29.3*  PLT 79* 81* 73*    Coag's  Recent Labs Lab 01/17/16 2320  01/20/16 0417 01/21/16 0432 01/22/16 0513  APTT  --   < > >200* 35 35  INR 1.57*  --   --   --   --   < > = values in this interval not displayed.  Sepsis Markers  Recent Labs Lab 01/17/16 1423 01/17/16 1819 01/19/16 0525 01/21/16 0432  LATICACIDVEN 1.9 2.1*  --   --   PROCALCITON 0.36  --  0.53 0.25    ABG  Recent Labs Lab 01/17/16 1435 01/17/16 1831  PHART 7.153* 7.177*  PCO2ART 79.0* 75.3*  PO2ART 61.6* 77.0*    Liver Enzymes  Recent Labs Lab 01/17/16 1838  01/21/16 0500 01/21/16 1555 01/22/16 0512  AST 41  --   --   --   --   ALT 22  --   --   --   --   ALKPHOS 57  --   --   --   --   BILITOT 1.5*  --   --   --   --   ALBUMIN 3.5  < > 3.5 3.4* 3.4*  < > = values in this interval not displayed.  Cardiac Enzymes  Recent Labs Lab 01/17/16 1838  TROPONINI 0.29*    Glucose  Recent Labs Lab 01/21/16 0805 01/21/16 1119 01/21/16 1548 01/21/16 1949 01/21/16 2326 01/22/16 0800  GLUCAP 152* 172* 169* 150* 152* 137*    Imaging No results found.   STUDIES:  4/07  LE Doppler >> no evidence of DVT bilaterally 4/07  ABD [place] >> normal amount of ascites, hepatic cirrhosis 4/07  Renal US >> small right renal cyst, otherwise kidneys unremarkable 4/08  ECHO >> EF 60-65%, normal wall motion, mild systolic flattening of ventricular septum c/w RV pressure overload, mild MR, LA moderately dilated, RV mildly dilated, RA mod to severely dilated, moderate TR, PA Peak 45 mm Hg 4/13 [place] Abd Doppler: Patent portal veins, perihepatic ascites.    CULTURES: BCx2 4/10 >> NGTD UA 4/10 >> TNTC RBC, 6-30 WBC, Nitrite neg UC 4/10 >> NG Sputum not collected  ANTIBIOTICS: Vanco 4/10 >> 4/12 Zosyn 4/10 >> 4/13 Ceftriaxone 4/13>>  SIGNIFICANT EVENTS: 4/06  Admit with weight gain, swelling   LINES/TUBES: R IJ CVC 4/10 >>  Portacath 4/9>>  DISCUSSION: 70 year old male admitted 4/6 with dyspnea and weight gain. Found to have subacute renal failure & CHF - ? In setting of liver disease, ? Decompensated cor pulmonale.  He underwent placement of a PermCath on 4/9 (local + sedation).  During dialysis (2 L removed) on 4/10 he also received narcotics for pain.  ? If acute mental status changes are related to medications in the setting of poor renal/hepatic clearance.   He later developed nausea and vomiting. Concern for possible aspiration but no overt infiltrate post vomiting on CXR.   ASSESSMENT / PLAN:  PULMONARY A: Acute on chronic hypercarbic respiratory failure -  Pulmonary hypertension - PA peak 45 on echo, large right heart border noted on chest x-ray ? Decompensated cor pulmonale ? Hepatopulmonary syndrome ? OSA / OHS P:   Oxygen to support saturations > 90% currently on 2L supplemental O2 via [employer] Pulmonary hygiene-IS, mobilize as able Lower extremity dopplers negative, doubt PE. Heparin stopped  Will need outpatient sleep study Consider Contrast Echo with bubble study to eval for Hepatopulmonary syndrome. No clear hypoxemia currently. Will defer this to late as is not an acute issue right now Volume removal as allowed  CARDIOVASCULAR A:  Shock, presumed septic. ? Contribution liver disease, R heart failure Hypertension PVD / chronic venous stasis P:  Cardiology following, appreciate input Fluid balance per nephrology, still total body volume overloaded. Has tolerated -8L total Continue to wean norepi, goal to SBP >90 given his cirrhosis in hopes to end pressor support, if still not off pressors who hold off additional  volume removal Abx empiric as below  RENAL A:   Acute Renal Failure - noted approximately 2 months prior to admission, on ACE, renal ultrasound negative for hydronephrosis.  Oliguria with hyperkalemia, HD initiated 4/10- now anuric Hyperkalemia- improving P:   Hemodialysis per nephrology, goal transition to IHD when feasible, unclear of long term goals with dialysis if no renal improvement, has shown no signs of renal recovery. Will need to sort through whether we believe there is hepatorenal physiology here. May not be able to do so while CVVHD is running (he will need fluid challenge, assessment of UOP, etc) Trend BMP / UOP>> no urine output Replace electrolytes as indicated PermCath care  GASTROINTESTINAL A:   Morbid obesity P:   Ok to start some PO's, renal diet See ID U/S with Duplex shows no portal venous thrombus  HEMATOLOGIC A:   Macrocytic anemia  Thrombocytopenia  Leukocytosis - resolved with broad spectrum ABx R/O PE - unable to complete CTA due to CAD, lower extremity doppler negative- unlikely cause at this point and do not feel further workup acutely is needed P:  Trend CBC, platelets Bleeding appears to have resolved SCDs for VTE PPx Heparin gtt d/c'd  INFECTIOUS A:   Sepsis- possible urinary source Possible SBP  P:   Assess cultures as above  Continue Ceftriaxone will cover SBP or urinary source. Day 6 total abx Monitor fever curve / WBC - has trended down  ENDOCRINE A:   Diabetes  P:   CBG every 4 with sliding scale insulin   NEUROLOGIC A:   Acute encephalopathy - suspect metabolic with narcotics plus acute on chronic kidney and liver disease.  Sedation for PermCath on 4/9 and some concern for Hepatic encephalopathy  P:   RASS goal: n/a Minimize sedating medications as able When necessary low-dose fentanyl for pain, try to minimize lactulose to 30g TID, want to titrate to 2-3 loose BM per day (at goal)   FAMILY  - Updates: Updated wife by  phone 4/14   - Inter-disciplinary family meet or Palliative Care meeting due by: 4/17   Independent CC  time 35 minutes   Levy Pupa, MD, PhD 01/22/2016, 10:47 AM [facility] [phone] or if no answer [phone]

## 2016-01-22 NOTE — Progress Notes (Signed)
Subjective:   Pressors weaning slowly-removed 2 liters with CRRT- no UOP - clinically still confused Objective Vital signs in last 24 hours: Filed Vitals:   01/22/16 0545 01/22/16 0615 01/22/16 0630 01/22/16 0700  BP: 106/49 111/38 116/42 92/67  Pulse: 86 80 85 88  Temp:      TempSrc:      Resp: Height:      Weight:      SpO2: 89% 90% 94% 96%   Weight change: -3.7 kg (-8 lb 2.5 oz)  Intake/Output Summary (Last 24 hours) at 01/22/16 0735 Last data filed at 01/22/16 0700  Gross per 24 hour  Intake 1323.62 ml  Output   3752 ml  Net -2428.38 ml    Assessment/ Plan: Pt is a 70 y.o. yo male who was admitted on 01/13/2016 with volume overload - thought due to cirrhosis and right sided heart failure- had fairly normal creatinine in Feb but progressive renal failure since- had to initiate HD due to oligoanuria and climbing creatinine then with clinical decline now on CRRT  Assessment/Plan: 1. Renal- subacute picture of renal failure with most notably volume overload refractory to diuretics- req initiation of HD 4/10- then needed CRRT shortly thereafter due to hemodynamic instability and has been on it since.  U/S normal- he does have proteinuria at 3 grams but no hematuria- low plts but heme not susicious for TTP/HUS type picture.  Working diagnosis right now is hemodynamically mediated kidney injury?   Now with clinical decline of unknown etiology- requiring CRRT.  Anuric.  Not really that optimistic regarding renal recovery, continue support for now but not sure he is the best long term chronic hemodialysis candidate 2. HTN/vol- now on pressors but being weaned - given CVP in the 20's and pitting edema will continue some volume removal  3. Anemia- hgb 9.4- iron is OK - have added ESA  4. Hyperkalemia- resolved 5. Hypoxia- supposedly fairly clear CXR- cards to work up for possible PE- has not required intubation which is actually pretty surprising 6. Sepsis- thought to be etiology  of decline- on zosyn/vanc - WBC better - hemodynamics improving -  abx stopped per CCM    Gianne Shugars A    Labs: Basic Metabolic Panel:  Recent Labs Lab 01/21/16 0500 01/21/16 1555 01/22/16 0512  NA 136 134* 137  K 4.2 3.9 3.9  CL 100* 98* 101  CO2 GLUCOSE 154* 180* 152*  BUN 26* 25* 24*  CREATININE 3.27* 3.12* 3.23*  CALCIUM 8.3* 8.1* 8.5*  PHOS 3.5 2.9 2.5   Liver Function Tests:  Recent Labs Lab 01/17/16 1838  01/21/16 0500 01/21/16 1555 01/22/16 0512  AST 41  --   --   --   --   ALT 22  --   --   --   --   ALKPHOS 57  --   --   --   --   BILITOT 1.5*  --   --   --   --   PROT 7.7  --   --   --   --   ALBUMIN 3.5  < > 3.5 3.4* 3.4*  < > = values in this interval not displayed. No results for input(s): LIPASE, AMYLASE in the last 168 hours.  Recent Labs Lab 01/17/16 1423 01/17/16 1839  AMMONIA 41* 36*   CBC:  Recent Labs Lab 01/17/16 1838  01/19/16 0525 01/20/16 0417 01/20/16 1546 01/21/16 0432 01/22/16 0513  WBC 16.3*  < >  8.7 7.7 9.5 11.2* 9.0  NEUTROABS 14.5*  --   --   --  7.8*  --   --   HGB 10.7*  < > 10.2* 9.4* 9.5* 9.7* 9.3*  HCT 33.9*  < > 31.4* 29.6* 29.3* 30.4* 29.3*  MCV 111.9*  < > 109.4* 108.8* 108.5* 108.2* 107.7*  PLT 91*  < > 98* 79* 79* 81* 73*  < > = values in this interval not displayed. Cardiac Enzymes:  Recent Labs Lab 01/17/16 1838  TROPONINI 0.29*   CBG:  Recent Labs Lab 01/21/16 0805 01/21/16 1119 01/21/16 1548 01/21/16 1949 01/21/16 2326  GLUCAP 152* 172* 169* 150* 152*    Iron Studies: No results for input(s): IRON, TIBC, TRANSFERRIN, FERRITIN in the last 72 hours. Studies/Results: [place] Art/ven Flow Abd Pelv Doppler  01/20/2016  EXAM: DUPLEX ULTRASOUND OF LIVER TECHNIQUE: Color and duplex Doppler ultrasound was performed to evaluate the hepatic in-flow and out-flow vessels. COMPARISON:  None. FINDINGS: Portal Vein Velocities Main:  20-24 cm/sec Right:  20 cm/sec with hepatopetal flow  Left:  62 cm/sec with hepatopetal flow Hepatic Vein Velocities Right:  39 cm/sec Middle:  41 cm/sec Left:  35 cm/sec Hepatic Artery Velocity:  187 cm/sec Splenic Vein Velocity:  Unable to visualize Varices: None visualized Ascites: Perihepatic ascites. Technically challenging examination secondary to patient body habitus and current clinical condition. IMPRESSION: 1. Patent main, left and right portal veins with predominantly hepatopetal flow. 2. Flow velocities are slightly low in the main portal vein which can be a sign of underlying portal hypertension. 3. Small volume perihepatic ascites. 4. Patent hepatic veins and hepatic artery. Electronically Signed   By: Malachy MoanHeath  McCullough M.D.   On: 01/20/2016 11:05   Medications: Infusions: . norepinephrine (LEVOPHED) Adult infusion 1 mcg/min (01/22/16 0416)  . dialysis replacement fluid (prismasate) 300 mL/hr at 01/22/16 0414  . dialysis replacement fluid (prismasate) 300 mL/hr at 01/22/16 0413  . dialysate (PRISMASATE) 1,500 mL/hr at 01/22/16 0414    Scheduled Medications: . cefTRIAXone (ROCEPHIN)  IV  2 g Intravenous Q24H  . darbepoetin (ARANESP) injection - NON-DIALYSIS  100 mcg Subcutaneous Q Thu-1800  . hydrocortisone sod succinate (SOLU-CORTEF) inj  50 mg Intravenous Q6H  . insulin aspart  0-5 Units Subcutaneous QHS  . insulin aspart  0-9 Units Subcutaneous TID WC  . ipratropium-albuterol  3 mL Nebulization QID  . lactulose  30 g Per Tube TID  . lactulose  300 mL Rectal Once  . pantoprazole (PROTONIX) IV  40 mg Intravenous Q24H  . sodium chloride flush  3 mL Intravenous Q12H    have reviewed scheduled and prn medications.  Physical Exam: General: confused Heart: RRR Lungs: no rales Abdomen: obese Extremities: pitting edema Dialysis Access: left PC placed 4/9    01/22/2016,7:35 AM  LOS: 9 days

## 2016-01-23 ENCOUNTER — Inpatient Hospital Stay (HOSPITAL_COMMUNITY): Payer: Medicare Other

## 2016-01-23 LAB — CBC
HEMATOCRIT: 28.8 % — AB (ref 39.0–52.0)
HEMOGLOBIN: 9.2 g/dL — AB (ref 13.0–17.0)
MCH: 34.1 pg — AB (ref 26.0–34.0)
MCHC: 31.9 g/dL (ref 30.0–36.0)
MCV: 106.7 fL — AB (ref 78.0–100.0)
PLATELETS: 63 10*3/uL — AB (ref 150–400)
RBC: 2.7 MIL/uL — ABNORMAL LOW (ref 4.22–5.81)
RDW: 17.1 % — AB (ref 11.5–15.5)
WBC: 8.3 10*3/uL (ref 4.0–10.5)

## 2016-01-23 LAB — GLUCOSE, CAPILLARY
GLUCOSE-CAPILLARY: 142 mg/dL — AB (ref 65–99)
GLUCOSE-CAPILLARY: 167 mg/dL — AB (ref 65–99)
GLUCOSE-CAPILLARY: 169 mg/dL — AB (ref 65–99)
Glucose-Capillary: 147 mg/dL — ABNORMAL HIGH (ref 65–99)
Glucose-Capillary: 186 mg/dL — ABNORMAL HIGH (ref 65–99)

## 2016-01-23 LAB — RENAL FUNCTION PANEL
Albumin: 3.3 g/dL — ABNORMAL LOW (ref 3.5–5.0)
Albumin: 3.6 g/dL (ref 3.5–5.0)
Anion gap: 10 (ref 5–15)
Anion gap: 12 (ref 5–15)
BUN: 22 mg/dL — AB (ref 6–20)
BUN: 18 mg/dL (ref 6–20)
CALCIUM: 8.5 mg/dL — AB (ref 8.9–10.3)
CHLORIDE: 102 mmol/L (ref 101–111)
CHLORIDE: 100 mmol/L — AB (ref 101–111)
CO2: 25 mmol/L (ref 22–32)
CO2: 24 mmol/L (ref 22–32)
CREATININE: 2.88 mg/dL — AB (ref 0.61–1.24)
Calcium: 8.9 mg/dL (ref 8.9–10.3)
Creatinine, Ser: 2.76 mg/dL — ABNORMAL HIGH (ref 0.61–1.24)
GFR calc Af Amer: 25 mL/min — ABNORMAL LOW (ref >60)
GFR calc non Af Amer: 22 mL/min — ABNORMAL LOW (ref >60)
GFR, EST AFRICAN AMERICAN: 24 mL/min — AB (ref >60)
GFR, EST NON AFRICAN AMERICAN: 21 mL/min — AB (ref >60)
GLUCOSE: 182 mg/dL — AB (ref 65–99)
Glucose, Bld: 176 mg/dL — ABNORMAL HIGH (ref 65–99)
POTASSIUM: 3.8 mmol/L (ref 3.5–5.1)
Phosphorus: 2.8 mg/dL (ref 2.5–4.6)
Phosphorus: 2.5 mg/dL (ref 2.5–4.6)
Potassium: 3.9 mmol/L (ref 3.5–5.1)
SODIUM: 137 mmol/L (ref 135–145)
Sodium: 136 mmol/L (ref 135–145)

## 2016-01-23 LAB — MAGNESIUM: MAGNESIUM: 2.5 mg/dL — AB (ref 1.7–2.4)

## 2016-01-23 MED ORDER — WHITE PETROLATUM GEL
Status: AC
  Administered 2016-01-23: 14:00:00
  Filled 2016-01-23: qty 1

## 2016-01-23 MED ORDER — LACTULOSE 10 GM/15ML PO SOLN
30.0000 g | Freq: Two times a day (BID) | ORAL | Status: DC
  Administered 2016-01-23 – 2016-01-25 (×4): 30 g
  Filled 2016-01-23 (×4): qty 45

## 2016-01-23 NOTE — Progress Notes (Signed)
  Cardiology note:  Remains confused. CVP high due to RHF and PAH. On pressors and CVVHD.   Not much else to add at this point.   Please call with questions.  Reuben Knoblock,MD 4:11 PM

## 2016-01-23 NOTE — Progress Notes (Signed)
Subjective:  Not much change overnight-on and off pressors- removed another 2 liters with CRRT- no UOP - clinically still confused Objective Vital signs in last 24 hours: Filed Vitals:   01/23/16 0615 01/23/16 0630 01/23/16 0645 01/23/16 0716  BP: 96/51 98/47 107/55   Pulse: 67 72 75   Temp:      TempSrc:      Resp: 11 11 11    Height:      Weight:      SpO2: 96% 98% 98% 95%   Weight change: -3.4 kg (-7 lb 7.9 oz)  Intake/Output Summary (Last 24 hours) at 01/23/16 16100723 Last data filed at 01/23/16 0700  Gross per 24 hour  Intake 958.01 ml  Output   3300 ml  Net -2341.99 ml    Assessment/ Plan: Pt is a 70 y.o. yo male who was admitted on 01/13/2016 with volume overload - thought due to cirrhosis and right sided heart failure- had fairly normal creatinine in Feb but progressive renal failure since- had to initiate HD due to oligoanuria and climbing creatinine then with clinical decline now on CRRT  Assessment/Plan: 1. Renal- subacute picture of renal failure with most notably volume overload refractory to diuretics- req initiation of HD 4/10- then needed CRRT shortly thereafter due to hemodynamic instability and has been on it since.  U/S normal- he does have proteinuria at 3 grams but no hematuria- low plts but heme not susicious for TTP/HUS type picture.  Working diagnosis right now is hemodynamically mediated kidney injury?   Now with clinical decline of unknown etiology- requiring CRRT.  Anuric.  Not really that optimistic regarding renal recovery, continue support for now but not sure he is the best long term chronic hemodialysis candidate 2. HTN/vol- now on pressors but being weaned - given CVP16 and pitting edema will continue volume removal  3. Anemia- hgb 9.2- iron is OK - have added ESA  4. Hyperkalemia- resolved 5. Hypoxia- supposedly fairly clear CXR- cards to work up for possible PE- has not required intubation which is actually pretty surprising 6. Sepsis- thought to be  etiology of decline- on zosyn/vanc - WBC better - hemodynamics improving -  abx now stopped per CCM    Tyrique Sporn A    Labs: Basic Metabolic Panel:  Recent Labs Lab 01/22/16 0512 01/22/16 1600 01/23/16 0429  NA 137 133* 137  K 3.9 3.7 3.9  CL 101 99* 102  CO2 26 23 25   GLUCOSE 152* 148* 176*  BUN 24* 23* 22*  CREATININE 3.23* 3.04* 2.88*  CALCIUM 8.5* 8.4* 8.5*  PHOS 2.5 2.6 2.8   Liver Function Tests:  Recent Labs Lab 01/17/16 1838  01/22/16 0512 01/22/16 1600 01/23/16 0429  AST 41  --   --   --   --   ALT 22  --   --   --   --   ALKPHOS 57  --   --   --   --   BILITOT 1.5*  --   --   --   --   PROT 7.7  --   --   --   --   ALBUMIN 3.5  < > 3.4* 3.5 3.3*  < > = values in this interval not displayed. No results for input(s): LIPASE, AMYLASE in the last 168 hours.  Recent Labs Lab 01/17/16 1423 01/17/16 1839  AMMONIA 41* 36*   CBC:  Recent Labs Lab 01/17/16 1838  01/20/16 0417 01/20/16 1546 01/21/16 [id number] 01/22/16 [id number] 01/23/16 [id number]  WBC 16.3*  < > 7.7 9.5 11.2* 9.0 8.3  NEUTROABS 14.5*  --   --  7.8*  --   --   --   HGB 10.7*  < > 9.4* 9.5* 9.7* 9.3* 9.2*  HCT 33.9*  < > 29.6* 29.3* 30.4* 29.3* 28.8*  MCV 111.9*  < > 108.8* 108.5* 108.2* 107.7* 106.7*  PLT 91*  < > 79* 79* 81* 73* 63*  < > = values in this interval not displayed. Cardiac Enzymes:  Recent Labs Lab 01/17/16 1838  TROPONINI 0.29*   CBG:  Recent Labs Lab 01/22/16 0800 01/22/16 1154 01/22/16 1600 01/22/16 1942 01/23/16 0022  GLUCAP 137* 141* 141* 131* 167*    Iron Studies: No results for input(s): IRON, TIBC, TRANSFERRIN, FERRITIN in the last 72 hours. Studies/Results: No results found. Medications: Infusions: . norepinephrine (LEVOPHED) Adult infusion 5 mcg/min (01/23/16 0500)  . dialysis replacement fluid (prismasate) 300 mL/hr at 01/22/16 2202  . dialysis replacement fluid (prismasate) 300 mL/hr at 01/22/16 2200  . dialysate (PRISMASATE) 1,500 mL/hr at  01/23/16 0122    Scheduled Medications: . cefTRIAXone (ROCEPHIN)  IV  2 g Intravenous Q24H  . darbepoetin (ARANESP) injection - NON-DIALYSIS  100 mcg Subcutaneous Q Thu-1800  . hydrocortisone sod succinate (SOLU-CORTEF) inj  50 mg Intravenous Q6H  . insulin aspart  0-5 Units Subcutaneous QHS  . insulin aspart  0-9 Units Subcutaneous TID WC  . ipratropium-albuterol  3 mL Nebulization QID  . lactulose  30 g Per Tube TID  . lactulose  300 mL Rectal Once  . pantoprazole (PROTONIX) IV  40 mg Intravenous Q24H  . sodium chloride flush  3 mL Intravenous Q12H    have reviewed scheduled and prn medications.  Physical Exam: General: confused Heart: RRR Lungs: no rales Abdomen: obese Extremities: pitting edema Dialysis Access: left PC placed 4/9    01/23/2016,7:23 AM  LOS: 10 days

## 2016-01-23 NOTE — Progress Notes (Signed)
PULMONARY / CRITICAL CARE MEDICINE   Name: Maurice Paul MRN: [id number] DOB: 12/06/1945    ADMISSION DATE:  01/13/2016 CONSULTATION DATE:  01/17/16  REFERRING MD:  Dr. 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    The patient reported on admission that he had gained 4-5 lbs a week for the month prior to admission.  He also had been treated for LE cellulitis with antibiotics and una-boots without significant relief (most recently has been treated with Keflex).  Patient also reported an obvious swelling in his lower extremities and abdominal distention.  Initial ER evaluation concerning for congestive heart failure, elevated troponin and acute kidney injury. Labs-NA 133, K5.2, CR 2.82, troponin 0.25, WBC 7.4, Hgb 11.0, MCV 106.1 and platelets 104. Initial chest x-ray with vascular congestion but no frank pulmonary edema, small bilateral pleural effusions. The patient was admitted per [facility] hospitalist for further evaluation.  The patient was evaluated by nephrology and felt to have subacute renal insufficiency in the setting of liver disease, ACE inhibitor use and right heart failure. He ultimately underwent placement of a PermCath per Dr. Edilia Boickson on 4/9.  He also was evaluated by Dr. Donnie Ahoilley of cardiology for right heart failure of unclear etiology (right heart failure vs cirrhosis vs OSA/OHS vs PE or combination).    Of note, the patient recently had an EGD completed by Dr. Ewing SchleinMagod on 3/29 which showed evidence of gastritis but no other  abnormality.  He previously was treated with Lasix and Aldactone.  The patient was noted to have altered mental status a.m. of 4/10.  During dialysis he was treated with narcotics for pain. After return from HD to the medical floor he was noted to be more altered and an ABG was obtained which reflected hypercarbic respiratory failure (acute on chronic). PCCM consulted for evaluation.    Patient's wife denies known history of snoring or periods of apnea. 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  SUBJECTIVE:  More awake and alert this morning, remains mildly confused, per nursing has had at least 5 BM over last 24 hours.  VITAL SIGNS: BP 107/55 mmHg  Pulse 75  Temp(Src) 97.8 F (36.6 C) (Oral)  Resp 11  Ht 5\' 10"  (1.778 m)  Wt 141.5 kg (311 lb 15.2 oz)  BMI 44.76 kg/m2  SpO2 95%  HEMODYNAMICS: CVP:  [16 mmHg] 16 mmHg  VENTILATOR SETTINGS:    INTAKE / OUTPUT: I/O last 3 completed shifts: In: 1266.4 [P.O.:525; I.V.:677.4; Other:14; IV Piggyback:50] Out: 4874 [Other:4470; Stool:404]  PHYSICAL EXAMINATION: General:  Obese male lying in bed, NAD wrapped in blankets Neuro:  Awake and alert, oriented to person and place but not time, moving all 4 extremities, no appreciated asterixis  HEENT:  MM pink/moist, unable to appreciate JVD, L perm cath present, Right IJ clean with no bleeding, supplemental O2 via [place] Cardiovascular:  s1s2 rrr, no m/r/g, palpable peripheral pulses Lungs:  Mild rales at bilateral bases, no wheezing Abdomen:  Obese/soft, distended  but not tense, bs hypoactive  Musculoskeletal:  No acute deformities  Skin:  Warm/dry, BLE 3+ edema, chronic venous stasis dermatitis, hematoma of right wrist  LABS:  BMET  Recent Labs Lab 01/22/16 0512 01/22/16 1600 01/23/16 0429  NA 137 133* 137  K 3.9 3.7 3.9  CL 101 99* 102  CO2 26 23 25   BUN 24* 23* 22*  CREATININE 3.23* 3.04* 2.88*  GLUCOSE 152* 148* 176*    Electrolytes  Recent Labs Lab  01/21/16 0432  01/22/16 0512 01/22/16 0513 01/22/16 1600 01/23/16 0429  CALCIUM  --   < > 8.5*  --  8.4* 8.5*  MG 2.6*  --   --  2.6*  --  2.5*  PHOS  --   < > 2.5  --  2.6 2.8  < > = values in this interval not displayed.  CBC  Recent Labs Lab 01/21/16 0432 01/22/16 0513 01/23/16 0429  WBC 11.2* 9.0 8.3  HGB 9.7* 9.3* 9.2*  HCT 30.4* 29.3* 28.8*  PLT 81* 73* 63*    Coag's  Recent Labs Lab 01/17/16 2320  01/20/16 0417 01/21/16 0432 01/22/16 0513  APTT  --   < > >200* 35 35  INR 1.57*  --   --   --   --   < > = values in this interval not displayed.  Sepsis Markers  Recent Labs Lab 01/17/16 1423 01/17/16 1819 01/19/16 0525 01/21/16 0432  LATICACIDVEN 1.9 2.1*  --   --   PROCALCITON 0.36  --  0.53 0.25    ABG  Recent Labs Lab 01/17/16 1435 01/17/16 1831  PHART 7.153* 7.177*  PCO2ART 79.0* 75.3*  PO2ART 61.6* 77.0*    Liver Enzymes  Recent Labs Lab 01/17/16 1838  01/22/16 0512 01/22/16 1600 01/23/16 0429  AST 41  --   --   --   --   ALT 22  --   --   --   --   ALKPHOS 57  --   --   --   --   BILITOT 1.5*  --   --   --   --   ALBUMIN 3.5  < > 3.4* 3.5 3.3*  < > = values in this interval not displayed.  Cardiac Enzymes  Recent Labs Lab 01/17/16 1838  TROPONINI 0.29*    Glucose  Recent Labs Lab 01/21/16 2326 01/22/16 0800 01/22/16 1154 01/22/16 1600 01/22/16 1942 01/23/16 0022  GLUCAP 152* 137* 141* 141* 131* 167*    Imaging Dg Chest Port 1 View  01/23/2016  CLINICAL DATA:  Acute respiratory failure.  Ex-smoker. EXAM: PORTABLE CHEST 1 VIEW COMPARISON:  01/20/2016. FINDINGS: The left jugular double-lumen catheter tips are in the right atrium. The right jugular catheter tip is in the right innominate vein. The nasogastric tube has been removed. Stable enlarged cardiac silhouette. Increased prominence of the pulmonary vasculature. Stable mild prominence of the interstitial markings. No pleural fluid. Thoracic spine degenerative  changes. IMPRESSION: 1. Mildly progressive pulmonary vascular congestion with stable cardiomegaly. 2. Stable mild chronic interstitial lung disease. Electronically Signed   By: Beckie Salts M.D.   On: 01/23/2016 07:25     STUDIES:  4/07  LE Doppler >> no evidence of DVT bilaterally 4/07  ABD [place] >> normal amount of ascites, hepatic cirrhosis 4/07  Renal US >> small right renal cyst, otherwise kidneys unremarkable 4/08  ECHO >> EF 60-65%, normal wall motion, mild systolic flattening of ventricular septum c/w RV pressure overload, mild MR, LA  moderately dilated, RV mildly dilated, RA mod to severely dilated, moderate TR, PA Peak 45 mm Hg 4/13 [place] Abd Doppler: Patent portal veins, perihepatic ascites.   CULTURES: BCx2 4/10 >> NGTD UA 4/10 >> TNTC RBC, 6-30 WBC, Nitrite neg UC 4/10 >> NG Sputum not collected  ANTIBIOTICS: Vanco 4/10 >> 4/12 Zosyn 4/10 >> 4/13 Ceftriaxone 4/13>>  SIGNIFICANT EVENTS: 4/06  Admit with weight gain, swelling   LINES/TUBES: R IJ CVC 4/10 >>  Portacath 4/9>>  DISCUSSION: 70 year old male admitted 4/6 with dyspnea and weight gain. Found to have subacute renal failure & CHF - ? In setting of liver disease, ? Decompensated cor pulmonale.  He underwent placement of a PermCath on 4/9 (local + sedation).  During dialysis (2 L removed) on 4/10 he also received narcotics for pain.  ? If acute mental status changes are related to medications in the setting of poor renal/hepatic clearance.   He later developed nausea and vomiting. Concern for possible aspiration but no overt infiltrate post vomiting on CXR.   ASSESSMENT / PLAN:  PULMONARY A: Acute on chronic hypercarbic respiratory failure -  Pulmonary hypertension - PA peak 45 on echo, large right heart border noted on chest x-ray ? Decompensated cor pulmonale ? Hepatopulmonary syndrome ? OSA / OHS P:   Oxygen to support saturations > 90% currently on 2L supplemental O2 via [place] Pulmonary hygiene-IS, mobilize as  able Lower extremity dopplers negative, doubt PE. Heparin stopped  Will need outpatient sleep study Consider Contrast Echo with bubble study to eval for Hepatopulmonary syndrome. No clear hypoxemia currently. Will defer this to later as is not an acute issue right now Would stop volume removal to be able to remain off pressors  CARDIOVASCULAR A:  Shock, presumed septic. ? Contribution liver disease, R heart failure Hypertension PVD / chronic venous stasis P:  Cardiology following, appreciate input Fluid balance per nephrology, still total body volume overloaded. Has tolerated -9.7L total Continue to wean norepi, goal to SBP >90 given his cirrhosis in hopes to end pressor support, would hold off additional volume removal for now to end pressor support Abx empiric as below  RENAL A:   Acute Renal Failure - noted approximately 2 months prior to admission, on ACE, renal ultrasound negative for hydronephrosis.  Oliguria with hyperkalemia, HD initiated 4/10- now anuric Hyperkalemia- resolved P:   Hemodialysis per nephrology, goal transition to IHD when feasible, unclear of long term goals with dialysis if no renal improvement, has shown no signs of renal recovery. Will need to sort through whether we believe there is hepatorenal physiology here. May not be able to do so while CVVHD is running (he will need fluid challenge, assessment of UOP, etc) Trend BMP / UOP>> no urine output Replace electrolytes as indicated PermCath care  GASTROINTESTINAL A:   Morbid obesity P:   Ok to start some PO's, renal diet See ID U/S with Duplex shows no portal venous thrombus  HEMATOLOGIC A:   Macrocytic anemia  Thrombocytopenia  Leukocytosis - resolved with broad spectrum ABx R/O PE - unable to complete CTA due to CAD, lower extremity doppler negative- unlikely cause at this point and do not feel further workup acutely is needed P:  Trend CBC, platelets Bleeding appears to have resolved SCDs for  VTE PPx Heparin gtt d/c'd  INFECTIOUS A:   Sepsis- possible urinary source Possible SBP  P:   Assess cultures as above  Continue Ceftriaxone will cover SBP or urinary source. Day 6 of 7 total abx  STOP DATE 4/17 Monitor fever curve / WBC - has trended down Will need to decide if this could be SBP as he will then need SBP PPx going forward  ENDOCRINE A:   Diabetes  P:   CBG every 4 with sliding scale insulin   NEUROLOGIC A:   Acute encephalopathy - suspect metabolic with narcotics plus acute on chronic kidney and liver disease.  Sedation for PermCath on 4/9 and some concern for Hepatic encephalopathy  P:   RASS goal: n/a Minimize sedating medications as able When necessary low-dose fentanyl for pain, try to minimize Decrease lactulose to 30g BID, want to titrate to 2-3 loose BM per day    FAMILY  - Updates: Updated wife by phone 4/14   - Inter-disciplinary family meet or Palliative Care meeting due by: 4/17  Gust Rung, DO IMTS PGY-3 01/23/2016, 10:42 AM [facility] [phone] or if no answer [phone]

## 2016-01-24 DIAGNOSIS — R188 Other ascites: Secondary | ICD-10-CM | POA: Diagnosis present

## 2016-01-24 DIAGNOSIS — J9620 Acute and chronic respiratory failure, unspecified whether with hypoxia or hypercapnia: Secondary | ICD-10-CM

## 2016-01-24 DIAGNOSIS — J9600 Acute respiratory failure, unspecified whether with hypoxia or hypercapnia: Secondary | ICD-10-CM | POA: Diagnosis present

## 2016-01-24 LAB — RENAL FUNCTION PANEL
ALBUMIN: 3.5 g/dL (ref 3.5–5.0)
ALBUMIN: 3.3 g/dL — AB (ref 3.5–5.0)
ANION GAP: 9 (ref 5–15)
ANION GAP: 12 (ref 5–15)
BUN: 20 mg/dL (ref 6–20)
BUN: 21 mg/dL — ABNORMAL HIGH (ref 6–20)
CALCIUM: 8.6 mg/dL — AB (ref 8.9–10.3)
CHLORIDE: 103 mmol/L (ref 101–111)
CO2: 26 mmol/L (ref 22–32)
CO2: 22 mmol/L (ref 22–32)
Calcium: 8.7 mg/dL — ABNORMAL LOW (ref 8.9–10.3)
Chloride: 100 mmol/L — ABNORMAL LOW (ref 101–111)
Creatinine, Ser: 2.61 mg/dL — ABNORMAL HIGH (ref 0.61–1.24)
Creatinine, Ser: 2.74 mg/dL — ABNORMAL HIGH (ref 0.61–1.24)
GFR calc Af Amer: 27 mL/min — ABNORMAL LOW (ref >60)
GFR, EST AFRICAN AMERICAN: 25 mL/min — AB (ref >60)
GFR, EST NON AFRICAN AMERICAN: 23 mL/min — AB (ref >60)
GFR, EST NON AFRICAN AMERICAN: 22 mL/min — AB (ref >60)
Glucose, Bld: 174 mg/dL — ABNORMAL HIGH (ref 65–99)
Glucose, Bld: 198 mg/dL — ABNORMAL HIGH (ref 65–99)
PHOSPHORUS: 2.9 mg/dL (ref 2.5–4.6)
PHOSPHORUS: 2.7 mg/dL (ref 2.5–4.6)
POTASSIUM: 4.1 mmol/L (ref 3.5–5.1)
POTASSIUM: 3.9 mmol/L (ref 3.5–5.1)
SODIUM: 134 mmol/L — AB (ref 135–145)
Sodium: 138 mmol/L (ref 135–145)

## 2016-01-24 LAB — CBC
HEMATOCRIT: 31.6 % — AB (ref 39.0–52.0)
HEMOGLOBIN: 10.4 g/dL — AB (ref 13.0–17.0)
MCH: 35.5 pg — ABNORMAL HIGH (ref 26.0–34.0)
MCHC: 32.9 g/dL (ref 30.0–36.0)
MCV: 107.8 fL — ABNORMAL HIGH (ref 78.0–100.0)
Platelets: 67 10*3/uL — ABNORMAL LOW (ref 150–400)
RBC: 2.93 MIL/uL — AB (ref 4.22–5.81)
RDW: 17.5 % — ABNORMAL HIGH (ref 11.5–15.5)
WBC: 12.1 10*3/uL — ABNORMAL HIGH (ref 4.0–10.5)

## 2016-01-24 LAB — GLUCOSE, CAPILLARY
GLUCOSE-CAPILLARY: 169 mg/dL — AB (ref 65–99)
GLUCOSE-CAPILLARY: 200 mg/dL — AB (ref 65–99)
GLUCOSE-CAPILLARY: 173 mg/dL — AB (ref 65–99)
Glucose-Capillary: 204 mg/dL — ABNORMAL HIGH (ref 65–99)

## 2016-01-24 LAB — MAGNESIUM: MAGNESIUM: 2.6 mg/dL — AB (ref 1.7–2.4)

## 2016-01-24 MED ORDER — VASOPRESSIN 20 UNIT/ML IV SOLN
0.0300 [IU]/min | INTRAVENOUS | Status: DC
  Administered 2016-01-24 – 2016-01-25 (×2): 0.03 [IU]/min via INTRAVENOUS
  Filled 2016-01-24 (×2): qty 2

## 2016-01-24 NOTE — Progress Notes (Signed)
PULMONARY / CRITICAL CARE MEDICINE   Name: Maurice Paul MRN: [id number] DOB: 11/10/1945    ADMISSION DATE:  01/13/2016 CONSULTATION DATE:  01/17/16  REFERRING MD:  Dr. 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    The patient reported on admission that he had gained 4-5 lbs a week for the month prior to admission.  He also had been treated for LE cellulitis with antibiotics and una-boots without significant relief (most recently has been treated with Keflex).  Patient also reported an obvious swelling in his lower extremities and abdominal distention.  Initial ER evaluation concerning for congestive heart failure, elevated troponin and acute kidney injury. Labs-NA 133, K5.2, CR 2.82, troponin 0.25, WBC 7.4, Hgb 11.0, MCV 106.1 and platelets 104. Initial chest x-ray with vascular congestion but no frank pulmonary edema, small bilateral pleural effusions. The patient was admitted per [facility] hospitalist for further evaluation.  The patient was evaluated by nephrology and felt to have subacute renal insufficiency in the setting of liver disease, ACE inhibitor use and right heart failure. He ultimately underwent placement of a PermCath per Dr. Edilia Boickson on 4/9.  He also was evaluated by Dr. Donnie Ahoilley of cardiology for right heart failure of unclear etiology (right heart failure vs cirrhosis vs OSA/OHS vs PE or combination).    Of note, the patient recently had an EGD completed by Dr. Ewing SchleinMagod on 3/29 which showed evidence of gastritis but no other  abnormality.  He previously was treated with Lasix and Aldactone.  The patient was noted to have altered mental status a.m. of 4/10.  During dialysis he was treated with narcotics for pain. After return from HD to the medical floor he was noted to be more altered and an ABG was obtained which reflected hypercarbic respiratory failure (acute on chronic). PCCM consulted for evaluation.    Patient's wife denies known history of snoring or periods of apnea. 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  SUBJECTIVE:  Appears less confused this morning, remains on CVVHD and levophed  VITAL SIGNS: BP 105/46 mmHg  Pulse 73  Temp(Src) 97.3 F (36.3 C) (Oral)  Resp 12  Ht 5\' 10"  (1.778 m)  Wt 142.7 kg (314 lb 9.5 oz)  BMI 45.14 kg/m2  SpO2 97%  HEMODYNAMICS: CVP:  [26 mmHg] 26 mmHg  VENTILATOR SETTINGS:    INTAKE / OUTPUT: I/O last 3 completed shifts: In: 1799.8 [P.O.:790; I.V.:948.8; Other:11; IV Piggyback:50] Out: 4098 [JXBJY:[id number] [Other:6277; Stool:452]  PHYSICAL EXAMINATION: General:  Obese male lying in bed, NAD wrapped in blankets Neuro:  Awake and alert, oriented to person and place and time, moving all 4 extremities, no appreciated asterixis  HEENT:  MM pink/moist, unable to appreciate JVD, L perm cath present, Right IJ clean with no bleeding, supplemental O2 via [place] Cardiovascular:  s1s2 no s3 rrr, no m/r/g, palpable peripheral pulses Lungs:  Mild rales at bilateral bases, no wheezing Abdomen:  Obese/soft, distended but not tense, bs hypoactive  Musculoskeletal:  No acute  deformities, BLE 3+ edema SCDs in place Skin:  Warm/dry, , chronic venous stasis dermatitis, extensive ecchymosis of right wrist and left upper chest, no active bleeding  LABS:  BMET  Recent Labs Lab 01/23/16 0429 01/23/16 1600 01/24/16 0420  NA 137 136 138  K 3.9 3.8 4.1  CL 102 100* 103  CO2 BUN 22* 18 20  CREATININE 2.88* 2.76* 2.61*  GLUCOSE 176* 182* 174*     Electrolytes  Recent Labs Lab 01/22/16 0513  01/23/16 0429 01/23/16 1600 01/24/16 0420  CALCIUM  --   < > 8.5* 8.9 8.7*  MG 2.6*  --  2.5*  --  2.6*  PHOS  --   < > 2.8 2.5 2.9  < > = values in this interval not displayed.  CBC  Recent Labs Lab 01/22/16 0513 01/23/16 0429 01/24/16 0420  WBC 9.0 8.3 12.1*  HGB 9.3* 9.2* 10.4*  HCT 29.3* 28.8* 31.6*  PLT 73* 63* 67*    Coag's  Recent Labs Lab 01/17/16 2320  01/20/16 0417 01/21/16 0432 01/22/16 0513  APTT  --   < > >200* 35 35  INR 1.57*  --   --   --   --   < > = values in this interval not displayed.  Sepsis Markers  Recent Labs Lab 01/17/16 1423 01/17/16 1819 01/19/16 0525 01/21/16 0432  LATICACIDVEN 1.9 2.1*  --   --   PROCALCITON 0.36  --  0.53 0.25    ABG  Recent Labs Lab 01/17/16 1435 01/17/16 1831  PHART 7.153* 7.177*  PCO2ART 79.0* 75.3*  PO2ART 61.6* 77.0*    Liver Enzymes  Recent Labs Lab 01/17/16 1838  01/23/16 0429 01/23/16 1600 01/24/16 0420  AST 41  --   --   --   --   ALT 22  --   --   --   --   ALKPHOS 57  --   --   --   --   BILITOT 1.5*  --   --   --   --   ALBUMIN 3.5  < > 3.3* 3.6 3.5  < > = values in this interval not displayed.  Cardiac Enzymes  Recent Labs Lab 01/17/16 1838  TROPONINI 0.29*    Glucose  Recent Labs Lab 01/22/16 1942 01/23/16 0022 01/23/16 0841 01/23/16 1206 01/23/16 1602 01/23/16 2241  GLUCAP 131* 167* 147* 186* 169* 142*    Imaging No results found.   STUDIES:  4/07  LE Doppler >> no evidence of DVT bilaterally 4/07  ABD [place] >> normal amount of ascites, hepatic cirrhosis 4/07  Renal US >> small right renal cyst, otherwise kidneys unremarkable 4/08  ECHO >> EF 60-65%, normal wall motion, mild systolic flattening of ventricular septum c/w RV pressure overload, mild MR, LA moderately dilated, RV mildly dilated, RA mod to severely dilated, moderate TR, PA Peak 45 mm Hg 4/13 [place] Abd Doppler: Patent portal veins, perihepatic  ascites.   CULTURES: BCx2 4/10 >> NGTD UA 4/10 >> TNTC RBC, 6-30 WBC, Nitrite neg UC 4/10 >> NG Sputum not collected  ANTIBIOTICS: Vanco 4/10 >> 4/12 Zosyn 4/10 >> 4/13 Ceftriaxone 4/13>>4/17  SIGNIFICANT EVENTS: 4/06  Admit with weight gain, swelling  4/17- remains in shock , on pressors, cvvhd  LINES/TUBES: R IJ CVC 4/10 >>  Portacath 4/9>>  DISCUSSION: 70 year old male admitted 4/6 with dyspnea and weight gain. Found to have subacute renal failure & CHF - ? In setting of liver disease, ?  Decompensated cor pulmonale.  He underwent placement of a PermCath on 4/9 (local + sedation).  During dialysis (2 L removed) on 4/10 he also received narcotics for pain.  ? If acute mental status changes are related to medications in the setting of poor renal/hepatic clearance.   He later developed nausea and vomiting. Concern for possible aspiration but no overt infiltrate post vomiting on CXR.   ASSESSMENT / PLAN:  PULMONARY A: Acute on chronic hypercarbic respiratory failure -  Pulmonary hypertension - PA peak 45 on echo, large right heart border noted on chest x-ray 0- clinical low suspcion ? Decompensated cor pulmonale ? Hepatopulmonary syndrome ? OSA / OHS P:   Oxygen to support saturations > 90% currently on 2L supplemental O2 via [place] Pulmonary hygiene-IS, mobilize as able Lower extremity dopplers negative, doubt PE. Heparin stopped  Will need outpatient sleep study Consider Contrast Echo with bubble study to eval for Hepatopulmonary syndrome. No clear hypoxemia currently. Will defer this to later as is not an acute issue right now Would like to stop volume removal to be able to remain off pressors, understand that nephrology wants to keep removal of fluid.   CARDIOVASCULAR A:  Shock, presumed septic. ? Contribution liver disease, R heart failure Hypertension PVD / chronic venous stasis P:  Cardiology following, appreciate input Fluid balance per nephrology, still total body  volume overloaded. Has tolerated -13.5L total Continue to wean norepi, goal to SBP >90 given his cirrhosis in hopes to end pressor support Abx empiric as below, end today Get cvp consider addition vasopressin Continued stress roids  consider MAp 55 with good MS  RENAL A:   Acute Renal Failure - noted approximately 2 months prior to admission, on ACE, renal ultrasound negative for hydronephrosis.  Oliguria with hyperkalemia, HD initiated 4/10- now anuric Hyperkalemia- resolved P:   Hemodialysis per nephrology, goal transition to IHD when feasible, unclear of long term goals with dialysis if no renal improvement, has shown no signs of renal recovery. Will need to sort through whether we believe there is hepatorenal physiology here. May not be able to do so while CVVHD is running (he will need fluid challenge, assessment of UOP, etc) Trend BMP / UOP>> no urine output Replace electrolytes as indicated PermCath care  GASTROINTESTINAL A:   Morbid obesity cirhosis P:   Ok to start some PO's, renal diet See ID U/S with Duplex shows no portal venous thrombus consider autoimmune work up   HEMATOLOGIC A:   Macrocytic anemia  Thrombocytopenia due to cirrhosis  Leukocytosis - resolved with broad spectrum ABx R/O PE - unable to complete CTA due to CAD, lower extremity doppler negative- unlikely cause at this point and do not feel further workup acutely is needed P:  Trend CBC, platelets Bleeding has resolved SCDs for VTE PPx  INFECTIOUS A:   Sepsis- possible urinary source Possible SBP  P:   Assess cultures as above  Continue Ceftriaxone will cover SBP or urinary source. Will stop after dose today for total of 7 days Abx Monitor fever curve / WBC - has trended down Will need to decide if this could be SBP as he will then need SBP PPx going forward  ENDOCRINE A:   Diabetes  P:   CBG every 4 with sliding scale insulin   NEUROLOGIC A:   Acute encephalopathy - suspect metabolic  with narcotics plus acute on chronic kidney and liver disease.  Sedation for PermCath on 4/9 and some concern for Hepatic encephalopathy  P:  RASS goal: n/a Minimize sedating medications as able When necessary low-dose fentanyl for pain, try to minimize lactulose to 30g BID, want to titrate to 2-3 loose BM per day   Patient Vitals for the past 24 hrs:  Stool Color  01/23/16 2000 Brown  01/23/16 1400 Brown  01/23/16 1000 [place]     FAMILY  - Updates: Updated wife by phone 4/14   - Inter-disciplinary family meet or Palliative Care meeting due by: 4/17  Gust Rung, DO IMTS PGY-3 01/24/2016, 8:04 AM [facility] [phone] or if no answer [phone]   STAFF NOTE: I, Rory Percy, MD FACP have personally reviewed patient's available data, including medical history, events of note, physical examination and test results as part of my evaluation. I have discussed with resident/NP and other care providers such as pharmacist, RN and RRT. In addition, I personally evaluated patient and elicited key findings of: awake, in chair, no distress, no sig crackles, pcxr mild int prominence but may be from pulm HTN, consider stop cvvhd and observe output and status as far as renal recovery, consider addition vaso, keep stress roids, abx x 7 days for presumed sbp, with cirrhosis and pa htn, would consider MAP 55 with good MS, lactulose maintain, would be okay if we use mono vaso The patient is critically ill with multiple organ systems failure and requires high complexity decision making for assessment and support, frequent evaluation and titration of therapies, application of advanced monitoring technologies and extensive interpretation of multiple databases.   Critical Care Time devoted to patient care services described in this note is 30 Minutes. This time reflects time of care of this signee: Rory Percy, MD FACP. This critical care time does not reflect procedure  time, or teaching time or supervisory time of PA/NP/Med student/Med Resident etc but could involve care discussion time. Rest per NP/medical resident whose note is outlined above and that I agree with   Mcarthur Rossetti. Tyson Alias, MD, FACP Pgr: [phone]  Pulmonary & Critical Care 01/24/2016 11:21 AM

## 2016-01-24 NOTE — Progress Notes (Signed)
Subjective:  Up 3lb overnight. Pressors resumed just before 1am.  No UOP, 4.682L off w/CRRT.  Responding to questions.  Voices no concerns at this time.  Updated family at bedside.  Objective Vital signs in last 24 hours: Filed Vitals:   01/24/16 0800 01/24/16 0804 01/24/16 0849 01/24/16 0900  BP: 113/69   87/37  Pulse: 80   79  Temp:  97.3 F (36.3 C)    TempSrc:  Oral    Resp: 20   14  Height:      Weight:      SpO2: 96%  97% 98%   Weight change: 2 lb 10.3 oz (1.2 kg)  Intake/Output Summary (Last 24 hours) at 01/24/16 1131 Last data filed at 01/24/16 0900  Gross per 24 hour  Intake 1023.6 ml  Output   4260 ml  Net -3236.4 ml    Physical Exam: General: awake, alert, family at bedside, responds to questions Heart: RRR Lungs: saturating well on 2L [place], normal WOB, no rales Abdomen: obese Extremities: 3+ pitting edema of LE Dialysis Access: left PC placed 4/9   Labs: Basic Metabolic Panel:  Recent Labs Lab 01/23/16 0429 01/23/16 1600 01/24/16 0420  NA 137 136 138  K 3.9 3.8 4.1  CL 102 100* 103  CO2 GLUCOSE 176* 182* 174*  BUN 22* 18 20  CREATININE 2.88* 2.76* 2.61*  CALCIUM 8.5* 8.9 8.7*  PHOS 2.8 2.5 2.9   Liver Function Tests:  Recent Labs Lab 01/17/16 1838  01/23/16 0429 01/23/16 1600 01/24/16 0420  AST 41  --   --   --   --   ALT 22  --   --   --   --   ALKPHOS 57  --   --   --   --   BILITOT 1.5*  --   --   --   --   PROT 7.7  --   --   --   --   ALBUMIN 3.5  < > 3.3* 3.6 3.5  < > = values in this interval not displayed. No results for input(s): LIPASE, AMYLASE in the last 168 hours.  Recent Labs Lab 01/17/16 1423 01/17/16 1839  AMMONIA 41* 36*   CBC:  Recent Labs Lab 01/17/16 1838  01/20/16 1546 01/21/16 0432 01/22/16 0513 01/23/16 0429 01/24/16 0420  WBC 16.3*  < > 9.5 11.2* 9.0 8.3 12.1*  NEUTROABS 14.5*  --  7.8*  --   --   --   --   HGB 10.7*  < > 9.5* 9.7* 9.3* 9.2* 10.4*  HCT 33.9*  < > 29.3* 30.4* 29.3*  28.8* 31.6*  MCV 111.9*  < > 108.5* 108.2* 107.7* 106.7* 107.8*  PLT 91*  < > 79* 81* 73* 63* 67*  < > = values in this interval not displayed. Cardiac Enzymes:  Recent Labs Lab 01/17/16 1838  TROPONINI 0.29*   CBG:  Recent Labs Lab 01/23/16 0841 01/23/16 1206 01/23/16 1602 01/23/16 2241 01/24/16 0806  GLUCAP 147* 186* 169* 142* 169*    Iron Studies: No results for input(s): IRON, TIBC, TRANSFERRIN, FERRITIN in the last 72 hours. Studies/Results: Dg Chest Port 1 View  01/23/2016  CLINICAL DATA:  Acute respiratory failure.  Ex-smoker. EXAM: PORTABLE CHEST 1 VIEW COMPARISON:  01/20/2016. FINDINGS: The left jugular double-lumen catheter tips are in the right atrium. The right jugular catheter tip is in the right innominate vein. The nasogastric tube has been removed. Stable enlarged cardiac silhouette. Increased  prominence of the pulmonary vasculature. Stable mild prominence of the interstitial markings. No pleural fluid. Thoracic spine degenerative changes. IMPRESSION: 1. Mildly progressive pulmonary vascular congestion with stable cardiomegaly. 2. Stable mild chronic interstitial lung disease. Electronically Signed   By: Beckie SaltsSteven  Reid M.D.   On: 01/23/2016 07:25   Medications: Infusions: . norepinephrine (LEVOPHED) Adult infusion 5 mcg/min (01/24/16 0059)  . dialysis replacement fluid (prismasate) 300 mL/hr at 01/24/16 0914  . dialysis replacement fluid (prismasate) 300 mL/hr at 01/24/16 0914  . dialysate (PRISMASATE) 1,500 mL/hr at 01/24/16 0914  . vasopressin (PITRESSIN) infusion - *FOR SHOCK*      Scheduled Medications: . cefTRIAXone (ROCEPHIN)  IV  2 g Intravenous Q24H  . darbepoetin (ARANESP) injection - NON-DIALYSIS  100 mcg Subcutaneous Q Thu-1800  . hydrocortisone sod succinate (SOLU-CORTEF) inj  50 mg Intravenous Q6H  . insulin aspart  0-5 Units Subcutaneous QHS  . insulin aspart  0-9 Units Subcutaneous TID WC  . ipratropium-albuterol  3 mL Nebulization QID  .  lactulose  30 g Per Tube BID  . pantoprazole (PROTONIX) IV  40 mg Intravenous Q24H  . sodium chloride flush  3 mL Intravenous Q12H   I have reviewed scheduled and prn medications. Assessment/ Plan: Pt is a 70 y.o. yo male who was admitted on 01/13/2016 with volume overload - thought due to cirrhosis and right sided heart failure- had fairly normal creatinine in Feb but progressive renal failure since- had to initiate HD due to oligoanuria and climbing creatinine then with clinical decline now on CRRT   1. Renal- subacute picture of renal failure with most notably volume overload refractory to diuretics- req initiation of HD 4/10- then needed CRRT shortly thereafter due to hemodynamic instability and has been on it since.  Will hold CRRT today and see how he does.  2. HTN/vol- On and off pressors- continues to have gross pitting edema of LE 3. Anemia- hgb 9.2- iron is OK - have added ESA  4. Hyperkalemia- resolved 5. Hypoxia- saturating 97% on 2L [place]. 6. Sepsis-  WBC slightly increased this morning to 12.1.  On rocephin 2g daily.  Viyaan Champine M. Nadine CountsGottschalk, DO PGY-2, [facility] 01/24/2016,11:31 AM  LOS: 11 days

## 2016-01-24 NOTE — Progress Notes (Signed)
Subjective:  Events of weekend reviewed. He is sitting up at bedside in chair and is is more lucid and conversant.  BP remains low. Not SOB, no chest pain.      Objective:  Vital Signs in the last 24 hours: BP 87/37 mmHg  Pulse 79  Temp(Src) 97.6 F (36.4 C) (Oral)  Resp 14  Ht 5\' 10"  (1.778 m)  Wt 142.7 kg (314 lb 9.5 oz)  BMI 45.14 kg/m2  SpO2 97%  Physical Exam: Awake obese white male currently in no acute distress Lungs:  Reduced BS  Cardiac:  Regular rhythm, normal S1 and S2, no S3, no murmur Extremities:  3+ edema noted with brawny erythema extending all the way up the lower extremities  Intake/Output from previous day: 04/16 0701 - 04/17 0700 In: 1081.2 [P.O.:340; I.V.:691.2; IV Piggyback:50] Out: 4682 [Stool:52] Weight Filed Weights   01/22/16 0500 01/23/16 0434 01/24/16 0500  Weight: 144.9 kg (319 lb 7.1 oz) 141.5 kg (311 lb 15.2 oz) 142.7 kg (314 lb 9.5 oz)    Lab Results: Basic Metabolic Panel:  Recent Labs  16/07/9603/16/17 1600 01/24/16 0420  NA 136 138  K 3.8 4.1  CL 100* 103  CO2 24 26  GLUCOSE 182* 174*  BUN 18 20  CREATININE 2.76* 2.61*    CBC:  Recent Labs  01/23/16 0429 01/24/16 0420  WBC 8.3 12.1*  HGB 9.2* 10.4*  HCT 28.8* 31.6*  MCV 106.7* 107.8*  PLT 63* 67*   PROTIME: Lab Results  Component Value Date   INR 1.57* 01/17/2016   INR 1.46 01/14/2016   INR 1.10 12/24/2013    Telemetry: Normal sinus rhythm  Assessment/Plan:  1.  Right heart failure and hypotension with pul hypertension.  D dimer is up and PE not eliminated completely although legs negative for DVT. Other possibilities are portal hypotension, obesity hypoventilation.  Possibility of shunting discussed with patient 2.  Prior diagnosis of cirrhosis questionable etiology  3. Subacute renal failure now on dialysis with some volume removed 4.  Right heart failure unclear etiology.  Unclear whether this is due to morbid obesity, subacute pulmonary emboli, or sleep apnea.    5.  Thrombocytopenia, macrocytosis, mildly low albumin and evidence of cirrhosis of which could be markers of cirrhosis 6.  Remains crtically ill with a number of decisions pending.  Recommendations:  Let's go ahead and get a bubble study at this time.    Maurice PalmerW. Spencer Rahul Malinak, Jr.  MD [facility] Cardiology  01/24/2016, 1:23 PM

## 2016-01-25 DIAGNOSIS — R768 Other specified abnormal immunological findings in serum: Secondary | ICD-10-CM | POA: Diagnosis present

## 2016-01-25 LAB — HEPATIC FUNCTION PANEL
ALK PHOS: 57 U/L (ref 38–126)
ALT: 34 U/L (ref 17–63)
AST: 45 U/L — ABNORMAL HIGH (ref 15–41)
Albumin: 3.3 g/dL — ABNORMAL LOW (ref 3.5–5.0)
BILIRUBIN DIRECT: 0.4 mg/dL (ref 0.1–0.5)
BILIRUBIN INDIRECT: 1.1 mg/dL — AB (ref 0.3–0.9)
BILIRUBIN TOTAL: 1.5 mg/dL — AB (ref 0.3–1.2)
Total Protein: 6.7 g/dL (ref 6.5–8.1)

## 2016-01-25 LAB — CBC
HCT: 29.7 % — ABNORMAL LOW (ref 39.0–52.0)
HEMOGLOBIN: 9.5 g/dL — AB (ref 13.0–17.0)
MCH: 34.3 pg — AB (ref 26.0–34.0)
MCHC: 32 g/dL (ref 30.0–36.0)
MCV: 107.2 fL — AB (ref 78.0–100.0)
Platelets: 49 10*3/uL — ABNORMAL LOW (ref 150–400)
RBC: 2.77 MIL/uL — AB (ref 4.22–5.81)
RDW: 17.5 % — ABNORMAL HIGH (ref 11.5–15.5)
WBC: 7.8 10*3/uL (ref 4.0–10.5)

## 2016-01-25 LAB — RENAL FUNCTION PANEL
ALBUMIN: 3.2 g/dL — AB (ref 3.5–5.0)
ANION GAP: 11 (ref 5–15)
BUN: 20 mg/dL (ref 6–20)
CO2: 24 mmol/L (ref 22–32)
Calcium: 8.5 mg/dL — ABNORMAL LOW (ref 8.9–10.3)
Chloride: 101 mmol/L (ref 101–111)
Creatinine, Ser: 2.4 mg/dL — ABNORMAL HIGH (ref 0.61–1.24)
GFR calc Af Amer: 30 mL/min — ABNORMAL LOW (ref >60)
GFR calc non Af Amer: 26 mL/min — ABNORMAL LOW (ref >60)
GLUCOSE: 211 mg/dL — AB (ref 65–99)
PHOSPHORUS: 3.5 mg/dL (ref 2.5–4.6)
POTASSIUM: 4 mmol/L (ref 3.5–5.1)
SODIUM: 136 mmol/L (ref 135–145)

## 2016-01-25 LAB — GLUCOSE, CAPILLARY
GLUCOSE-CAPILLARY: 204 mg/dL — AB (ref 65–99)
GLUCOSE-CAPILLARY: 230 mg/dL — AB (ref 65–99)
Glucose-Capillary: 135 mg/dL — ABNORMAL HIGH (ref 65–99)
Glucose-Capillary: 152 mg/dL — ABNORMAL HIGH (ref 65–99)

## 2016-01-25 LAB — MAGNESIUM: Magnesium: 2.5 mg/dL — ABNORMAL HIGH (ref 1.7–2.4)

## 2016-01-25 MED ORDER — LACTULOSE 10 GM/15ML PO SOLN
15.0000 g | Freq: Two times a day (BID) | ORAL | Status: DC
  Administered 2016-01-25 – 2016-01-26 (×3): 15 g
  Filled 2016-01-25 (×5): qty 30

## 2016-01-25 MED ORDER — VASOPRESSIN 20 UNIT/ML IV SOLN
0.0300 [IU]/min | INTRAVENOUS | Status: DC
  Filled 2016-01-25: qty 2

## 2016-01-25 MED ORDER — PANTOPRAZOLE SODIUM 40 MG PO PACK
40.0000 mg | PACK | Freq: Every day | ORAL | Status: AC
  Administered 2016-01-25 – 2016-01-28 (×4): 40 mg
  Filled 2016-01-25 (×4): qty 20

## 2016-01-25 MED ORDER — IPRATROPIUM-ALBUTEROL 0.5-2.5 (3) MG/3ML IN SOLN
3.0000 mL | RESPIRATORY_TRACT | Status: DC | PRN
Start: 2016-01-25 — End: 2016-02-02

## 2016-01-25 NOTE — Progress Notes (Signed)
Subjective:  Down 8lb overnight. Pressors continued overnight.  CRRT not stopped yesterday.  No UOP, 2.323L off w/CRRT.  Patient reports that he is doing well at this time.  Voices no concerns at this time.    Objective Vital signs in last 24 hours: Filed Vitals:   01/25/16 0900 01/25/16 0915 01/25/16 1015 01/25/16 1030  BP: 97/51 112/55 150/56 135/69  Pulse: 83 82 88 88  Temp:      TempSrc:      Resp: 19 19 20 14   Height:      Weight:      SpO2: 96% 93% 98% 97%   Weight change: -8 lb 9.6 oz (-3.9 kg)  Intake/Output Summary (Last 24 hours) at 01/25/16 1126 Last data filed at 01/25/16 0800  Gross per 24 hour  Intake  507.3 ml  Output   1725 ml  Net -1217.7 ml    Physical Exam: General: awake, alert, responds to questions, sitting up at bedside Heart: RRR on monitor Lungs: saturating well on 2L [place], normal WOB Abdomen: obese Extremities: 2-3+ pitting edema of LE Dialysis Access: left PC placed 4/9  Labs: Basic Metabolic Panel:  Recent Labs Lab 01/24/16 0420 01/24/16 1559 01/25/16 0422  NA 138 134* 136  K 4.1 3.9 4.0  CL 103 100* 101  CO2 26 22 24   GLUCOSE 174* 198* 211*  BUN 20 21* 20  CREATININE 2.61* 2.74* 2.40*  CALCIUM 8.7* 8.6* 8.5*  PHOS 2.9 2.7 3.5   Liver Function Tests:  Recent Labs Lab 01/24/16 0420 01/24/16 1559 01/25/16 0422  AST  --   --  45*  ALT  --   --  34  ALKPHOS  --   --  57  BILITOT  --   --  1.5*  PROT  --   --  6.7  ALBUMIN 3.5 3.3* 3.3*  3.2*   No results for input(s): LIPASE, AMYLASE in the last 168 hours. No results for input(s): AMMONIA in the last 168 hours. CBC:  Recent Labs Lab 01/20/16 1546 01/21/16 0432 01/22/16 0513 01/23/16 0429 01/24/16 0420 01/25/16 0422  WBC 9.5 11.2* 9.0 8.3 12.1* 7.8  NEUTROABS 7.8*  --   --   --   --   --   HGB 9.5* 9.7* 9.3* 9.2* 10.4* 9.5*  HCT 29.3* 30.4* 29.3* 28.8* 31.6* 29.7*  MCV 108.5* 108.2* 107.7* 106.7* 107.8* 107.2*  PLT 79* 81* 73* 63* 67* 49*   Cardiac Enzymes: No  results for input(s): CKTOTAL, CKMB, CKMBINDEX, TROPONINI in the last 168 hours. CBG:  Recent Labs Lab 01/24/16 0806 01/24/16 1152 01/24/16 1607 01/24/16 2200 01/25/16 0754  GLUCAP 169* 200* 173* 204* 204*    Iron Studies: No results for input(s): IRON, TIBC, TRANSFERRIN, FERRITIN in the last 72 hours. Studies/Results: No results found. Medications: Infusions: . norepinephrine (LEVOPHED) Adult infusion 2.5 mcg/min (01/24/16 1225)  . vasopressin (PITRESSIN) infusion - *FOR SHOCK* 0.03 Units/min (01/25/16 0908)    Scheduled Medications: . darbepoetin (ARANESP) injection - NON-DIALYSIS  100 mcg Subcutaneous Q Thu-1800  . hydrocortisone sod succinate (SOLU-CORTEF) inj  50 mg Intravenous Q6H  . insulin aspart  0-5 Units Subcutaneous QHS  . insulin aspart  0-9 Units Subcutaneous TID WC  . ipratropium-albuterol  3 mL Nebulization QID  . lactulose  15 g Per Tube BID  . pantoprazole (PROTONIX) IV  40 mg Intravenous Q24H  . sodium chloride flush  3 mL Intravenous Q12H   I have reviewed scheduled and prn medications. Assessment/ Plan: Pt  is a 70 y.o. yo male who was admitted on 01/13/2016 with volume overload - thought due to cirrhosis and right sided heart failure- had fairly normal creatinine in Feb but progressive renal failure since- had to initiate HD due to oligoanuria and climbing creatinine then with clinical decline. Will stop CRRT today  1. Renal- subacute picture of renal failure with most notably volume overload refractory to diuretics- req initiation of HD 4/10- then needed CRRT shortly thereafter due to hemodynamic instability and has been on it since.  Net neg 1.2L, Will STOP CRRT today and see how he does.  2. HTN/vol- On pressors- continues to have pitting edema of bilateral LE 3. Anemia- hgb 9.5, on ESA  4. Hyperkalemia- resolved 5. Hypoxia- saturating 97% on 2L [place]. 6. Sepsis-  Afebrile overnight.  WBC improved today 7.8.  Completed 7 day abx.  Ashly M. Nadine Counts,  DO PGY-2, [facility] 01/25/2016,11:26 AM  LOS: 12 days

## 2016-01-25 NOTE — Progress Notes (Signed)
Inpatient Diabetes Program Recommendations  AACE/ADA: New Consensus Statement on Inpatient Glycemic Control (2015)  Target Ranges:  Prepandial:   less than 140 mg/dL      Peak postprandial:   less than 180 mg/dL (1-2 hours)      Critically ill patients:  140 - 180 mg/dL   Review of Glycemic Control Results for Maurice Paul, Maurice Paul (MRN [id number]) as of 01/25/2016 09:29  Ref. Range 01/24/2016 08:06 01/24/2016 11:52 01/24/2016 16:07 01/24/2016 22:00 01/25/2016 07:54  Glucose-Capillary Latest Ref Range: 65-99 mg/dL 409169 (H) 811200 (H) 914173 (H) 204 (H) 204 (H)   Diabetes history: DM Type 2 Outpatient Diabetes medications: Onglyza 2.5 mg q d Current orders for Inpatient glycemic control: Novolog sensitive correction scale ac & hs  Inpatient Diabetes Program Recommendations:  If steroids are continued, please consider adding Lantus 10 units daily while in the hospital.  Thank you, Maurice FischerJudy E. Teyanna Thielman, RN, MSN, CDE Inpatient Glycemic Control Team Team Pager [phone] (8am-5pm) 01/25/2016 9:31 AM

## 2016-01-25 NOTE — Progress Notes (Signed)
Patient BP cuff changed, BP showing more accurate numbers now. Will continue to attempt weaning pressors as per MD.

## 2016-01-25 NOTE — Care Management Important Message (Signed)
Important Message  Patient Details  Name: Maurice BaizeWalter S Rodda MRN: [id number] Date of Birth: 06/14/1946   Medicare Important Message Given:  Other (see comment)    [name] 01/25/2016, 10:58 AM

## 2016-01-25 NOTE — Progress Notes (Signed)
PULMONARY / CRITICAL CARE MEDICINE   Name: Maurice Paul MRN: [id number] DOB: [birth year]    ADMISSION DATE:  01/13/2016 CONSULTATION DATE:  01/17/16  REFERRING MD:  Dr. 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    The patient reported on admission that he had gained 4-5 lbs a week for the month prior to admission.  He also had been treated for LE cellulitis with antibiotics and una-boots without significant relief (most recently has been treated with Keflex).  Patient also reported an obvious swelling in his lower extremities and abdominal distention.  Initial ER evaluation concerning for congestive heart failure, elevated troponin and acute kidney injury. Labs-NA 133, K5.2, CR 2.82, troponin 0.25, WBC 7.4, Hgb 11.0, MCV 106.1 and platelets 104. Initial chest x-ray with vascular congestion but no frank pulmonary edema, small bilateral pleural effusions. The patient was admitted per [facility] hospitalist for further evaluation.  The patient was evaluated by nephrology and felt to have subacute renal insufficiency in the setting of liver disease, ACE inhibitor use and right heart failure. He ultimately underwent placement of a PermCath per Dr. Edilia Bo on 4/9.  He also was evaluated by Dr. Donnie Aho of cardiology for right heart failure of unclear etiology (right heart failure vs cirrhosis vs OSA/OHS vs PE or combination).    Of note, the patient recently had an EGD completed by Dr. Ewing Schlein on 3/29 which showed evidence of gastritis but no other  abnormality.  He previously was treated with Lasix and Aldactone.  The patient was noted to have altered mental status a.m. of 4/10.  During dialysis he was treated with narcotics for pain. After return from HD to the medical floor he was noted to be more altered and an ABG was obtained which reflected hypercarbic respiratory failure (acute on chronic). PCCM consulted for evaluation.    Patient's wife denies known history of snoring or periods of apnea. 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  SUBJECTIVE:  Remained on CVVHD overnight, this morning a little drowsy but remains oriented, still feeling a little cold.  VITAL SIGNS: BP 93/66 mmHg  Pulse 81  Temp(Src) 97.5 F (36.4 C) (Oral)  Resp 16  Ht  (1.778 m)  Wt 138.8 kg (306 lb)  BMI 43.91 kg/m2  SpO2 98%  HEMODYNAMICS: CVP:  [22 mmHg] 22 mmHg  VENTILATOR SETTINGS:    INTAKE / OUTPUT: I/O last 3 completed shifts: In: 1231.8 [P.O.:290; I.V.:891.8; IV Piggyback:50] Out: 4559 [Other:4509; Stool:50]  PHYSICAL EXAMINATION: General:  Obese male lying in bed, NAD wrapped in blankets Neuro:  Awake and alert, oriented to person and place and time, moving all 4 extremities, no  asterixis  HEENT:  MM pink/moist, unable to appreciate JVD, L perm cath present, Right IJ clean with no bleeding, supplemental O2 via [place] Cardiovascular:  s1s2 no s3 rrr, no m/r/g, palpable peripheral pulses Lungs:  CTA Abdomen:  Obese/soft, distended but not tense, bs hypoactive  Musculoskeletal:  No acute deformities, BLE  3+ edema SCDs in place Skin:  Warm/dry, chronic venous stasis dermatitis, extensive ecchymosis of right wrist and left upper chest, no active bleeding  LABS:  BMET  Recent Labs Lab 01/24/16 0420 01/24/16 1559 01/25/16 0422  NA 138 134* 136  K 4.1 3.9 4.0  CL 103 100* 101  CO2 BUN 20 21* 20  CREATININE 2.61* 2.74* 2.40*  GLUCOSE 174* 198* 211*    Electrolytes  Recent Labs Lab  01/23/16 0429  01/24/16 0420 01/24/16 1559 01/25/16 0422  CALCIUM 8.5*  < > 8.7* 8.6* 8.5*  MG 2.5*  --  2.6*  --  2.5*  PHOS 2.8  < > 2.9 2.7 3.5  < > = values in this interval not displayed.  CBC  Recent Labs Lab 01/23/16 0429 01/24/16 0420 01/25/16 0422  WBC 8.3 12.1* 7.8  HGB 9.2* 10.4* 9.5*  HCT 28.8* 31.6* 29.7*  PLT 63* 67* 49*    Coag's  Recent Labs Lab 01/20/16 0417 01/21/16 0432 01/22/16 0513  APTT >200* 35 35    Sepsis Markers  Recent Labs Lab 01/19/16 0525 01/21/16 0432  PROCALCITON 0.53 0.25    ABG No results for input(s): PHART, PCO2ART, PO2ART in the last 168 hours.  Liver Enzymes  Recent Labs Lab 01/24/16 0420 01/24/16 1559 01/25/16 0422  AST  --   --  45*  ALT  --   --  34  ALKPHOS  --   --  57  BILITOT  --   --  1.5*  ALBUMIN 3.5 3.3* 3.3*  3.2*    Cardiac Enzymes No results for input(s): TROPONINI, PROBNP in the last 168 hours.  Glucose  Recent Labs Lab 01/23/16 1602 01/23/16 2241 01/24/16 0806 01/24/16 1152 01/24/16 1607 01/24/16 2200  GLUCAP 169* 142* 169* 200* 173* 204*    Imaging No results found.   STUDIES:  4/07  LE Doppler >> no evidence of DVT bilaterally 4/07  ABD [place] >> normal amount of ascites, hepatic cirrhosis 4/07  Renal US >> small right renal cyst, otherwise kidneys unremarkable 4/08  ECHO >> EF 60-65%, normal wall motion, mild systolic flattening of ventricular septum c/w RV pressure overload, mild MR, LA moderately dilated, RV mildly dilated, RA mod to severely dilated, moderate TR, PA Peak 45 mm Hg 4/13 [place] Abd Doppler: Patent portal veins, perihepatic ascites.   CULTURES: BCx2 4/10 >> NG  UA 4/10 >> TNTC RBC, 6-30 WBC, Nitrite neg UC 4/10 >> NG Sputum not collected  ANTIBIOTICS: Vanco 4/10 >> 4/12 Zosyn 4/10 >> 4/13 Ceftriaxone 4/13>>4/17  SIGNIFICANT EVENTS: 4/06  Admit with weight gain, swelling  4/17- remains in shock , on pressors,  4/18 cvvhd discontinued  LINES/TUBES: R  IJ CVC 4/10 >>  Portacath 4/9>>  DISCUSSION: 70 year old male admitted 4/6 with dyspnea and weight gain. Found to have subacute renal failure & CHF - ? In setting of liver disease, ? Decompensated cor pulmonale.  He underwent placement of a PermCath on 4/9 (local + sedation).  During dialysis (2 L removed) on 4/10 he also received narcotics for pain.  ? If acute mental status changes are related to medications in the setting of poor renal/hepatic clearance.   He later developed nausea and vomiting. Concern for possible aspiration but no overt infiltrate post vomiting on CXR.   ASSESSMENT / PLAN:  PULMONARY A: Acute on chronic hypercarbic respiratory failure -  Pulmonary hypertension - PA peak 45 on echo, large right heart border noted on chest x-ray 0-  clinical low suspcion ? Decompensated cor pulmonale ? Hepatopulmonary syndrome ? OSA / OHS P:   Oxygen to support saturations > 90% currently on 2L supplemental O2 via [place] Pulmonary hygiene-IS, mobilize as able Lower extremity dopplers negative, doubt PE. Heparin stopped  Will need outpatient sleep study  Contrast Echo with bubble study to eval for Hepatopulmonary syndrome ordered  CARDIOVASCULAR A:  Shock, presumed septic. ? Contribution liver disease, R heart failure Hypertension PVD / chronic venous stasis P:  Cardiology following, appreciate input cvp BID vasopressin titrate to off as goal Continued stress roids MAp 55 with good MS Re assessing cuff on other ext  RENAL A:   Acute Renal Failure P:   Still on CVVHD this morning, renal to discontinue Trend BMP / UOP>> no urine output Consider diuresis once off cvvhd Replace electrolytes as indicated PermCath care  GASTROINTESTINAL A:   Morbid obesity cirhosis P:   Ok to start some PO's, renal diet See ID U/S with Duplex shows no portal venous thrombus consider autoimmune work up- requested notes from GI  HEMATOLOGIC A:   Macrocytic anemia  Thrombocytopenia due  to cirrhosis  Leukocytosis - resolved with broad spectrum ABx R/O PE - unable to complete CTA due to CAD, lower extremity doppler negative- unlikely cause at this point and do not feel further workup acutely is needed P:  Trend CBC, platelets Bleeding has resolved SCDs for VTE PPx  INFECTIOUS A:   Sepsis- possible urinary source versus SBP  P:   Culture negative, completed 7 day course of Abx Monitor fever curve / WBC - has trended down  ENDOCRINE A:   Diabetes  AI P:   CBG every 4 with sliding scale insulin  Stress roids Range 140-180  NEUROLOGIC A:   Acute encephalopathy - suspect metabolic with narcotics plus acute on chronic kidney and liver disease.  Improved P:   RASS goal: n/a Minimize sedating medications as able When necessary low-dose fentanyl for pain, try to minimize lactulose to 30g BID, want to titrate to 2-3 loose BM per day   Patient Vitals for the past 24 hrs:  Stool Color  01/24/16 2000 Brown  01/24/16 1600 Brown  01/24/16 1200 Brown  01/24/16 0800 Brown     FAMILY  - Updates: wife, son and daughter updated at bedside 4/18 by df   - Inter-disciplinary family meet or Palliative Care meeting due by: 4/17  Gust Rung, DO IMTS PGY-3 01/25/2016, 7:09 AM [facility] [phone] or if no answer [phone]   STAFF NOTE: I, Rory Percy, MD FACP have personally reviewed patient's available data, including medical history, events of note, physical examination and test results as part of my evaluation. I have discussed with resident/NP and other care providers such as pharmacist, RN and RRT. In addition, I personally evaluated patient and elicited key findings of: awake, no distress, mild fluid wave, CTA, BP about to dc levophed to amp goal 55 mental status is excellent, hope can dc vasopressin to this same goal, if remains off pressors 24 hr then would reduce stress roids and taper over 4 days, cvvhd to end now, was neg 1.7  liters last 24 hr, may need lasix challenge per renal, HD schedule per renal, await GI outpt work up done for liver dz, ensure sent hep panel if not done as outpt, checking cuff on other ext The patient is critically ill with multiple organ systems failure and requires high complexity decision making for assessment and support, frequent  evaluation and titration of therapies, application of advanced monitoring technologies and extensive interpretation of multiple databases.   Critical Care Time devoted to patient care services described in this note is 30 Minutes. This time reflects time of care of this signee: Rory Percyaniel Feinstein, MD FACP. This critical care time does not reflect procedure time, or teaching time or supervisory time of PA/NP/Med student/Med Resident etc but could involve care discussion time. Rest per NP/medical resident whose note is outlined above and that I agree with   Mcarthur Rossettianiel J. Tyson AliasFeinstein, MD, FACP Pgr: [phone] [place] [facility] 01/25/2016 11:10 AM

## 2016-01-26 ENCOUNTER — Inpatient Hospital Stay (HOSPITAL_COMMUNITY): Payer: Medicare Other

## 2016-01-26 LAB — GLUCOSE, CAPILLARY
GLUCOSE-CAPILLARY: 179 mg/dL — AB (ref 65–99)
Glucose-Capillary: 180 mg/dL — ABNORMAL HIGH (ref 65–99)
Glucose-Capillary: 198 mg/dL — ABNORMAL HIGH (ref 65–99)
Glucose-Capillary: 148 mg/dL — ABNORMAL HIGH (ref 65–99)

## 2016-01-26 LAB — RENAL FUNCTION PANEL
Albumin: 3.1 g/dL — ABNORMAL LOW (ref 3.5–5.0)
Anion gap: 7 (ref 5–15)
BUN: 33 mg/dL — AB (ref 6–20)
CALCIUM: 8.8 mg/dL — AB (ref 8.9–10.3)
CO2: 25 mmol/L (ref 22–32)
CREATININE: 3.54 mg/dL — AB (ref 0.61–1.24)
Chloride: 103 mmol/L (ref 101–111)
GFR calc non Af Amer: 16 mL/min — ABNORMAL LOW (ref >60)
GFR, EST AFRICAN AMERICAN: 19 mL/min — AB (ref >60)
GLUCOSE: 201 mg/dL — AB (ref 65–99)
Phosphorus: 4.3 mg/dL (ref 2.5–4.6)
Potassium: 4.1 mmol/L (ref 3.5–5.1)
SODIUM: 135 mmol/L (ref 135–145)

## 2016-01-26 LAB — MAGNESIUM: Magnesium: 2.7 mg/dL — ABNORMAL HIGH (ref 1.7–2.4)

## 2016-01-26 MED ORDER — MIDODRINE HCL 5 MG PO TABS
10.0000 mg | ORAL_TABLET | Freq: Two times a day (BID) | ORAL | Status: DC | PRN
  Administered 2016-01-31: 10 mg via ORAL
  Filled 2016-01-26 (×2): qty 2

## 2016-01-26 MED ORDER — ALBUMIN HUMAN 25 % IV SOLN: 12.5000 g | Freq: Once | INTRAVENOUS | Status: DC

## 2016-01-26 MED ORDER — SODIUM CHLORIDE 0.9 % IV BOLUS (SEPSIS): 250.0000 mL | Freq: Once | INTRAVENOUS | Status: DC

## 2016-01-26 NOTE — Evaluation (Signed)
Physical Therapy Evaluation Patient Details Name: Maurice Paul MRN: [id number] DOB: 1946/01/23 Today's Date: 01/26/2016   History of Present Illness  pt presents with Anasarca, Acute Renal Failure, Acute on Chronic Respiratory Failure, and Cirrhosis.  pt with hx of HTN, PVD, and DM.    Clinical Impression  Pt motivated to work with therapy today and was able to stand and take steps at EOB.  Pt would benefit from CIR level of therapies at D/C to maximize independence and decrease burden of care prior to return to home with wife.  Will continue to follow.      Follow Up Recommendations CIR    Equipment Recommendations  None recommended by PT    Recommendations for Other Services Rehab consult     Precautions / Restrictions Precautions Precautions: Fall Precaution Comments: monitor O2 and BP Restrictions Weight Bearing Restrictions: No      Mobility  Bed Mobility Overal bed mobility: Needs Assistance Bed Mobility: Supine to Sit;Sit to Supine     Supine to sit: Mod assist;+2 for physical assistance;HOB elevated Sit to supine: Mod assist;+2 for physical assistance   General bed mobility comments: pt does A with bed mobility, but does require A for Bil LEs and trunk.  Transfers Overall transfer level: Needs assistance Equipment used: 2 person hand held assist Transfers: Sit to/from Stand Sit to Stand: Min assist;+2 physical assistance         General transfer comment: Placed chair back in front of pt for UE support once standing.  pt able to come to stand and perform pre-gait activities weight shifting and taking steps in place.    Ambulation/Gait Ambulation/Gait assistance: Min assist;+2 physical assistance Ambulation Distance (Feet):  (Side steps) Assistive device:  (Chair back)       General Gait Details: Side steps towards HOB x4.  pt maintained O2 sats during side steps.    Stairs            Wheelchair Mobility    Modified Rankin (Stroke Patients  Only)       Balance Overall balance assessment: Needs assistance Sitting-balance support: No upper extremity supported;Feet supported Sitting balance-Leahy Scale: Fair     Standing balance support: Bilateral upper extremity supported;During functional activity Standing balance-Leahy Scale: Poor                               Pertinent Vitals/Pain Pain Assessment: No/denies pain    Home Living Family/patient expects to be discharged to:: Private residence Living Arrangements: Spouse/significant other Available Help at Discharge: Family;Available 24 hours/day Type of Home: House Home Access: Stairs to enter Entrance Stairs-Rails: Left Entrance Stairs-Number of Steps: 3 Home Layout: Two level Home Equipment: Cane - single point;Grab bars - tub/shower;Grab bars - toilet Additional Comments: pt uses bathroom which has not been modified, pulls up from toilet using vanity, reports having broken vanities.    Prior Function Level of Independence: Independent         Comments: furniture walks. Goes into community without device     Hand Dominance   Dominant Hand: Right    Extremity/Trunk Assessment   Upper Extremity Assessment: Defer to OT evaluation           Lower Extremity Assessment: Generalized weakness (Bil LE edema)         Communication   Communication: No difficulties  Cognition Arousal/Alertness: Awake/alert Behavior During Therapy: WFL for tasks assessed/performed Overall Cognitive Status: Impaired/Different from baseline  General Comments: pt oriented, but at times talks off topic.  Unclear if this is baseline for pt.      General Comments      Exercises        Assessment/Plan    PT Assessment Patient needs continued PT services  PT Diagnosis Difficulty walking;Generalized weakness   PT Problem List Decreased strength;Decreased activity tolerance;Decreased balance;Decreased mobility;Decreased  coordination;Decreased cognition;Decreased knowledge of use of DME;Cardiopulmonary status limiting activity;Obesity  PT Treatment Interventions DME instruction;Gait training;Stair training;Functional mobility training;Therapeutic activities;Therapeutic exercise;Balance training;Cognitive remediation;Patient/family education   PT Goals (Current goals can be found in the Care Plan section) Acute Rehab PT Goals Patient Stated Goal: Get better PT Goal Formulation: With patient Time For Goal Achievement: 02/09/16 Potential to Achieve Goals: Good    Frequency Min 3X/week   Barriers to discharge        Co-evaluation PT/OT/SLP Co-Evaluation/Treatment: Yes Reason for Co-Treatment: For patient/therapist safety PT goals addressed during session: Mobility/safety with mobility;Balance         End of Session Equipment Utilized During Treatment: Gait belt;Oxygen Activity Tolerance: Patient tolerated treatment well Patient left: in bed;with call bell/phone within reach Nurse Communication: Mobility status         Time: [id number] PT Time Calculation (min) (ACUTE ONLY): 31 min   Charges:   PT Evaluation $PT Eval Moderate Complexity: 1 Procedure     PT G CodesSunny Schlein:        Cederic Mozley F, [place]PT [phone] 01/26/2016, 3:16 PM

## 2016-01-26 NOTE — Progress Notes (Signed)
Rehab Admissions Coordinator Note:  Patient was screened by Clois DupesBoyette, Jett Kulzer Godwin for appropriateness for an Inpatient Acute Rehab Consult per PT and OT recommendations.  At this time, we are recommending Inpatient Rehab consult. Please place order when felt medically appropriate.  Clois DupesBoyette, Jamaiyah Pyle Godwin 01/26/2016, 3:42 PM  I can be reached at [phone].

## 2016-01-26 NOTE — Progress Notes (Signed)
PULMONARY / CRITICAL CARE MEDICINE   Name: Jewel BaizeWalter S Rini MRN: [id number] DOB: 06/29/1946    ADMISSION DATE:  01/13/2016 CONSULTATION DATE:  01/17/16  REFERRING MD:  Dr. 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    The patient reported on admission that he had gained 4-5 lbs a week for the month prior to admission.  He also had been treated for LE cellulitis with antibiotics and una-boots without significant relief (most recently has been treated with Keflex).  Patient also reported an obvious swelling in his lower extremities and abdominal distention.  Initial ER evaluation concerning for congestive heart failure, elevated troponin and acute kidney injury. Labs-NA 133, K5.2, CR 2.82, troponin 0.25, WBC 7.4, Hgb 11.0, MCV 106.1 and platelets 104. Initial chest x-ray with vascular congestion but no frank pulmonary edema, small bilateral pleural effusions. The patient was admitted per [facility] hospitalist for further evaluation.  The patient was evaluated by nephrology and felt to have subacute renal insufficiency in the setting of liver disease, ACE inhibitor use and right heart failure. He ultimately underwent placement of a PermCath per Dr. Edilia Boickson on 4/9.  He also was evaluated by Dr. Donnie Ahoilley of cardiology for right heart failure of unclear etiology (right heart failure vs cirrhosis vs OSA/OHS vs PE or combination).    Of note, the patient recently had an EGD completed by Dr. Ewing SchleinMagod on 3/29 which showed evidence of gastritis but no other  abnormality.  He previously was treated with Lasix and Aldactone.  The patient was noted to have altered mental status a.m. of 4/10.  During dialysis he was treated with narcotics for pain. After return from HD to the medical floor he was noted to be more altered and an ABG was obtained which reflected hypercarbic respiratory failure (acute on chronic). PCCM consulted for evaluation.    Patient's wife denies known history of snoring or periods of apnea. 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  SUBJECTIVE:  No UOP overnight, does feel bladder sensation and thinks he may urinate soon. Vasopressin had to be restarted overnight due to hypotension  VITAL SIGNS: BP 95/42 mmHg  Pulse 73  Temp(Src) 97 F (36.1 C) (Oral)  Resp 14  Ht 5\' 10"  (1.778 m)  Wt 140 kg (308 lb 10.3 oz)  BMI 44.29 kg/m2  SpO2 93%  HEMODYNAMICS: CVP:  [20 mmHg] 20 mmHg  VENTILATOR SETTINGS:    INTAKE / OUTPUT: I/O last 3 completed shifts: In: 1645.9 [P.O.:720; I.V.:925.9] Out: 1132 [Other:1132]  PHYSICAL EXAMINATION: General:  Obese male lying in bed, NAD Neuro:  AAOx4, moving all 4 extremities, no  asterixis  HEENT:  MM pink/moist, unable to appreciate JVD, L perm cath present, Right IJ clean with no bleeding, supplemental O2 via [place] 2L Cardiovascular:  s1s2 no s3 rrr, no m/r/g Lungs:  CTA Abdomen:  Obese/soft, distended but not tense, bs hypoactive  Musculoskeletal:  No acute deformities, BLE 3+ edema SCDs in place Skin:  Warm/dry, chronic venous  stasis dermatitis, improving ecchymosis of right wrist and left upper chest, no active bleeding  LABS:  BMET  Recent Labs Lab 01/24/16 1559 01/25/16 0422 01/26/16 0425  NA 134* 136 135  K 3.9 4.0 4.1  CL 100* 101 103  CO2 BUN 21* 20 33*  CREATININE 2.74* 2.40* 3.54*  GLUCOSE 198* 211* 201*    Electrolytes  Recent Labs Lab 01/24/16 0420 01/24/16 1559 01/25/16 0422 01/26/16 0425  CALCIUM 8.7* 8.6* 8.5* 8.8*  MG  2.6*  --  2.5* 2.7*  PHOS 2.9 2.7 3.5 4.3    CBC  Recent Labs Lab 01/23/16 0429 01/24/16 0420 01/25/16 0422  WBC 8.3 12.1* 7.8  HGB 9.2* 10.4* 9.5*  HCT 28.8* 31.6* 29.7*  PLT 63* 67* 49*    Coag's  Recent Labs Lab 01/20/16 0417 01/21/16 0432 01/22/16 0513  APTT >200* 35 35    Sepsis Markers  Recent Labs Lab 01/21/16 0432  PROCALCITON 0.25    ABG No results for input(s): PHART, PCO2ART, PO2ART in the last 168 hours.  Liver Enzymes  Recent Labs Lab 01/24/16 1559 01/25/16 0422 01/26/16 0425  AST  --  45*  --   ALT  --  34  --   ALKPHOS  --  57  --   BILITOT  --  1.5*  --   ALBUMIN 3.3* 3.3*  3.2* 3.1*    Cardiac Enzymes No results for input(s): TROPONINI, PROBNP in the last 168 hours.  Glucose  Recent Labs Lab 01/24/16 1607 01/24/16 2200 01/25/16 0754 01/25/16 1144 01/25/16 1543 01/25/16 1955  GLUCAP 173* 204* 204* 230* 135* 152*    Imaging No results found.   STUDIES:  4/07  LE Doppler >> no evidence of DVT bilaterally 4/07  ABD [place] >> normal amount of ascites, hepatic cirrhosis 4/07  Renal US >> small right renal cyst, otherwise kidneys unremarkable 4/08  ECHO >> EF 60-65%, normal wall motion, mild systolic flattening of ventricular septum c/w RV pressure overload, mild MR, LA moderately dilated, RV mildly dilated, RA mod to severely dilated, moderate TR, PA Peak 45 mm Hg 4/13 [place] Abd Doppler: Patent portal veins, perihepatic ascites.   CULTURES: BCx2 4/10 >> NG  UA 4/10 >> TNTC RBC, 6-30 WBC, Nitrite neg UC 4/10 >> NG Sputum not collected  ANTIBIOTICS: Vanco 4/10 >> 4/12 Zosyn 4/10 >> 4/13 Ceftriaxone 4/13>>4/17  SIGNIFICANT EVENTS: 4/06  Admit with weight gain, swelling  4/17- remains in shock , on pressors,  4/18 cvvhd discontinued 4/19- vaso still needed  LINES/TUBES: R IJ CVC 4/10 >>  Portacath 4/9>>  DISCUSSION: 70 year old male admitted 4/6 with dyspnea and weight gain. Found to have subacute renal failure &  CHF - ? In setting of liver disease, ? Decompensated cor pulmonale.  He underwent placement of a PermCath on 4/9 (local + sedation).  During dialysis (2 L removed) on 4/10 he also received narcotics for pain.  ? If acute mental status changes are related to medications in the setting of poor renal/hepatic clearance.   He later developed nausea and vomiting. Concern for possible aspiration but no overt infiltrate post vomiting on CXR.   ASSESSMENT / PLAN:  PULMONARY A: Acute on chronic hypercarbic respiratory failure -  Pulmonary hypertension - PA peak 45 on echo, large right heart border noted on chest x-ray 0- clinical low suspcion ? Decompensated cor pulmonale ? Hepatopulmonary syndrome ? OSA / OHS P:   Oxygen to support saturations > 90% currently on 2L supplemental  O2 via  Pulmonary hygiene-IS, mobilize as able Lower extremity dopplers negative, doubt PE. Heparin stopped  Will need outpatient sleep study  Contrast Echo with bubble study to eval for Hepatopulmonary syndrome ordered not yet completed  CARDIOVASCULAR A:  Shock, presumed septic. ? Contribution liver disease, R heart failure Hypertension PVD / chronic venous stasis PA 45 P:  cvp BID vasopressin titrate to off as goal Continued stress roids, titrate off after off pressors, keep MAp 55 with good MS Unclear volume status? 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 in notes (ordered), ASMA positive, considering autoimmune hepatitis per notes but patient declined biopsy, will attempt to contact Dr Ewing Schlein today  HEMATOLOGIC A:   Macrocytic anemia  Thrombocytopenia due to cirrhosis   Leukocytosis - resolved with broad spectrum ABx R/O PE - unable to complete CTA due to CAD, lower extremity doppler negative- unlikely cause at this point and do not feel further workup acutely is needed P:  Trend CBC, platelets, needed in am  Bleeding has resolved SCDs for VTE PPx  INFECTIOUS A:   Sepsis- possible urinary source versus SBP  P:   Culture negative, completed 7 day course of Abx Monitor fever curve / WBC - has trended down Low threshold repeat BC  ENDOCRINE A:   Diabetes  AI P:   CBG every 4 with sliding scale insulin  Stress roids Range 140-180  NEUROLOGIC A:   Acute encephalopathy - suspect metabolic with narcotics plus acute on chronic kidney and liver disease.  Improved P:   RASS goal: n/a Minimize sedating medications as able When necessary low-dose fentanyl for pain, try to minimize lactulose to 15g BID, want to titrate to 2-3 loose BM per day   Patient Vitals for the past 24 hrs:  Stool Color Emesis Occurrence  01/25/16 2300 Brown -  01/25/16 1947 Brown -  01/25/16 1700 - 1  01/25/16 1500 - 1     FAMILY  - Updates: wife, son and daughter updated at bedside 4/18 by df   - Inter-disciplinary family meet or Palliative Care meeting due by: 4/17  Gust Rung, DO IMTS PGY-3 01/26/2016, 8:39 AM [facility] [phone] or if no answer [phone]  STAFF NOTE: I, Rory Percy, MD FACP have personally reviewed patient's available data, including medical history, events of note, physical examination and test results as part of my evaluation. I have discussed with resident/NP and other care providers such as pharmacist, RN and RRT. In addition, I personally evaluated patient and elicited key findings of: awake, ascites unchanged, remains on pressors vasopressin, get clear sight noninvasive monitor A line, bladder scan, maintain stress roids, would favor a bolus challenge and re assess BP response, if this was sbp and residual  shock would benefit from albumin , will consider , await further hepatitis / GI work up, will aldo assess fluid responsive nature with noninvasive monitor, maintain diet, hd per renal, bladder scan The patient is critically ill with multiple organ systems failure and requires high complexity decision making for assessment and support, frequent evaluation and titration of therapies, application of advanced monitoring technologies and extensive  interpretation of multiple databases.   Critical Care Time devoted to patient care services described in this note is30 Minutes. This time reflects time of care of this signee: Rory Percy, MD FACP. This critical care time does not reflect procedure time, or teaching time or supervisory time of PA/NP/Med student/Med Resident etc but could involve care discussion time. Rest per NP/medical resident whose note is outlined above and that I agree with   Mcarthur Rossetti. Tyson Alias, MD, FACP Pgr: [phone] [place] 01/26/2016 9:25 AM

## 2016-01-26 NOTE — Progress Notes (Signed)
Echocardiogram 2D Echocardiogram with bubble study has been performed.  Maurice Paul, Maurice Paul 01/26/2016, 11:51 AM

## 2016-01-26 NOTE — Evaluation (Signed)
Occupational Therapy Evaluation Patient Details Name: Maurice Paul MRN: [id number] DOB: 12-03-1945 Today's Date: 01/26/2016    History of Present Illness pt presents with Anasarca, Acute Renal Failure, Acute on Chronic Respiratory Failure, and Cirrhosis.  pt with hx of HTN, PVD, and DM.     Clinical Impression   PT admitted with above statement. Pt currently with functional limitiations due to the deficits listed below (see OT problem list). Pt motivated to participate and abl to stand this session.  Pt will benefit from skilled OT to increase their independence and safety with adls and balance to allow discharge CIR. Pt reports previous CIR admission.      Follow Up Recommendations  CIR    Equipment Recommendations  None recommended by OT    Recommendations for Other Services       Precautions / Restrictions Precautions Precautions: Fall Precaution Comments: monitor O2 and BP Restrictions Weight Bearing Restrictions: No      Mobility Bed Mobility Overal bed mobility: Needs Assistance Bed Mobility: Supine to Sit;Sit to Supine     Supine to sit: Mod assist;+2 for physical assistance;HOB elevated Sit to supine: Mod assist;+2 for physical assistance   General bed mobility comments: pt does A with bed mobility, but does require A for Bil LEs and trunk.  Transfers Overall transfer level: Needs assistance Equipment used: 2 person hand held assist Transfers: Sit to/from Stand Sit to Stand: Min assist;+2 physical assistance         General transfer comment: Placed chair back in front of pt for UE support once standing.  pt able to come to stand and perform pre-gait activities weight shifting and taking steps in place.      Balance Overall balance assessment: Needs assistance Sitting-balance support: No upper extremity supported;Feet supported Sitting balance-Leahy Scale: Fair     Standing balance support: Bilateral upper extremity supported;During functional  activity Standing balance-Leahy Scale: Poor                              ADL Overall ADL's : Needs assistance/impaired     Grooming: Wash/dry face;Set up;Sitting                   Toilet Transfer: Minimal assistance;+2 for physical assistance   Toileting- Clothing Manipulation and Hygiene: Total assistance         General ADL Comments: Pt eager to participate.      Vision Vision Assessment?: No apparent visual deficits   Perception     Praxis      Pertinent Vitals/Pain Pain Assessment: No/denies pain     Hand Dominance Right   Extremity/Trunk Assessment Upper Extremity Assessment Upper Extremity Assessment: Generalized weakness   Lower Extremity Assessment Lower Extremity Assessment: Generalized weakness   Cervical / Trunk Assessment Cervical / Trunk Assessment: Kyphotic   Communication Communication Communication: No difficulties   Cognition Arousal/Alertness: Awake/alert Behavior During Therapy: WFL for tasks assessed/performed Overall Cognitive Status: Impaired/Different from baseline                 General Comments: pt oriented, but at times talks off topic.  Unclear if this is baseline for pt.     General Comments       Exercises       Shoulder Instructions      Home Living Family/patient expects to be discharged to:: Private residence Living Arrangements: Spouse/significant other Available Help at Discharge: Family;Available 24 hours/day Type of Home:  House Home Access: Stairs to enter Secretary/administratorntrance Stairs-Number of Steps: 3 Entrance Stairs-Rails: Left Home Layout: Two level Alternate Level Stairs-Number of Steps: flight Alternate Level Stairs-Rails: Left Bathroom Shower/Tub: Producer, television/film/videoWalk-in shower   Bathroom Toilet: Standard Bathroom Accessibility: No   Home Equipment: Cane - single point;Grab bars - tub/shower;Grab bars - toilet   Additional Comments: pt uses bathroom which has not been modified, pulls up from toilet  using vanity, reports having broken vanities.      Prior Functioning/Environment Level of Independence: Independent        Comments: furniture walks. Goes into community without device    OT Diagnosis: Generalized weakness   OT Problem List: Decreased strength;Decreased activity tolerance;Impaired balance (sitting and/or standing);Decreased knowledge of use of DME or AE;Decreased safety awareness;Cardiopulmonary status limiting activity;Obesity;Increased edema   OT Treatment/Interventions: Self-care/ADL training;DME and/or AE instruction;Energy conservation;Patient/family education;Balance training;Therapeutic exercise;Therapeutic activities    OT Goals(Current goals can be found in the care plan section) Acute Rehab OT Goals Patient Stated Goal: Get better OT Goal Formulation: With patient Time For Goal Achievement: 01/30/16 Potential to Achieve Goals: Good  OT Frequency: Min 2X/week   Barriers to D/C:            Co-evaluation PT/OT/SLP Co-Evaluation/Treatment: Yes Reason for Co-Treatment: Complexity of the patient's impairments (multi-system involvement);Necessary to address cognition/behavior during functional activity;For patient/therapist safety PT goals addressed during session: Mobility/safety with mobility;Balance OT goals addressed during session: ADL's and self-care;Strengthening/ROM      End of Session Equipment Utilized During Treatment: Gait belt;Oxygen Nurse Communication: Mobility status;Precautions  Activity Tolerance: Patient limited by fatigue Patient left: in bed;with call bell/phone within reach   Time: 1000-1030 OT Time Calculation (min): 30 min Charges:  OT General Charges $OT Visit: 1 Procedure OT Evaluation $OT Eval High Complexity: 1 Procedure G-Codes:    Maurice Paul, Maurice Paul 01/26/2016, 3:26 PM   Maurice Paul, Maurice Paul   OTR/L Pager: [phone] Office: [phone] .

## 2016-01-26 NOTE — Progress Notes (Signed)
Subjective:  The bubble study has not been done yet. CVVH was stopped yesterday.  No UOP yet.  Some nausea.  NO chest pain or SOB.      Objective:  Vital Signs in the last 24 hours: BP 95/42 mmHg  Pulse 73  Temp(Src) 97 F (36.1 C) (Oral)  Resp 14  Ht 5\' 10"  (1.778 m)  Wt 140 kg (308 lb 10.3 oz)  BMI 44.29 kg/m2  SpO2 93%  Physical Exam: Awake obese white male currently in no acute distress Lungs:  Reduced BS  Cardiac:  Regular rhythm, normal S1 and S2, no S3, no murmur Extremities:  3+ edema noted with brawny erythema extending all the way up the lower extremities  Intake/Output from previous day: 04/18 0701 - 04/19 0700 In: 1390.3 [P.O.:720; I.V.:670.3] Out: 75  Weight Filed Weights   01/24/16 0500 01/25/16 0414 01/26/16 0500  Weight: 142.7 kg (314 lb 9.5 oz) 138.8 kg (306 lb) 140 kg (308 lb 10.3 oz)    Lab Results: Basic Metabolic Panel:  Recent Labs  11/91/4704/18/17 0422 01/26/16 0425  NA 136 135  K 4.0 4.1  CL 101 103  CO2 24 25  GLUCOSE 211* 201*  BUN 20 33*  CREATININE 2.40* 3.54*    CBC:  Recent Labs  01/24/16 0420 01/25/16 0422  WBC 12.1* 7.8  HGB 10.4* 9.5*  HCT 31.6* 29.7*  MCV 107.8* 107.2*  PLT 67* 49*   PROTIME: Lab Results  Component Value Date   INR 1.57* 01/17/2016   INR 1.46 01/14/2016   INR 1.10 12/24/2013    Telemetry: Normal sinus rhythm  Assessment/Plan:  1.  Right heart failure and hypotension with pul hypertension.   2.  Prior diagnosis of cirrhosis questionable etiology  3. Subacute renal failure currently off of CVVH awaiting whther renal function recovers. 4.  Right heart failure unclear etiology.  Unclear whether this is due to morbid obesity with obesity hypoventilation,  subacute pulmonary emboli, or sleep apnea.   5.  Thrombocytopenia, macrocytosis, mildly low albumin and evidence of cirrhosis of which could be markers of cirrhosis  Recommendations:  I talked with ECHO again and asked them to get the bubble study.     Darden PalmerW. Spencer Tilley, Jr.  MD [facility]  01/26/2016, 9:03 AM

## 2016-01-26 NOTE — Progress Notes (Signed)
Bladder scan performed> 560 cc urine indicated on monitor. Dr Tyson Aliasfeinstein made aware ordered to cath patient. Bloody urine (old blood) with clots noted and cath immediately drained 600 cc. Md ordered to irrigate foley q6 hours

## 2016-01-26 NOTE — Progress Notes (Signed)
[facility] ROUNDING NOTE   Subjective:   Interval History:  CRRT stopped , bladder outlet obstruction   Objective:  Vital signs in last 24 hours:  Temp:  [97 F (36.1 C)-98.3 F (36.8 C)] 97 F (36.1 C) (04/19 0751) Pulse Rate:  [68-90] 73 (04/19 0700) Resp:  [13-28] 14 (04/19 0700) BP: (89-150)/(35-119) 95/42 mmHg (04/19 0700) SpO2:  [88 %-98 %] 93 % (04/19 0700) Weight:  [140 kg (308 lb 10.3 oz)] 140 kg (308 lb 10.3 oz) (04/19 0500)  Weight change: 1.2 kg (2 lb 10.3 oz) Filed Weights   01/24/16 0500 01/25/16 0414 01/26/16 0500  Weight: 142.7 kg (314 lb 9.5 oz) 138.8 kg (306 lb) 140 kg (308 lb 10.3 oz)    Intake/Output: I/O last 3 completed shifts: In: 1645.9 [P.O.:720; I.V.:925.9] Out: 1132 [Other:1132]   Intake/Output this shift:     CVS- RRR RS- CTA ABD- BS present soft non-distended EXT- 3 + edema   Basic Metabolic Panel:  Recent Labs Lab 01/22/16 0513  01/23/16 0429 01/23/16 1600 01/24/16 0420 01/24/16 1559 01/25/16 0422 01/26/16 0425  NA  --   < > 137 136 138 134* 136 135  K  --   < > 3.9 3.8 4.1 3.9 4.0 4.1  CL  --   < > 102 100* 103 100* 101 103  CO2  --   < > GLUCOSE  --   < > 176* 182* 174* 198* 211* 201*  BUN  --   < > 22* 18 20 21* 20 33*  CREATININE  --   < > 2.88* 2.76* 2.61* 2.74* 2.40* 3.54*  CALCIUM  --   < > 8.5* 8.9 8.7* 8.6* 8.5* 8.8*  MG 2.6*  --  2.5*  --  2.6*  --  2.5* 2.7*  PHOS  --   < > 2.8 2.5 2.9 2.7 3.5 4.3  < > = values in this interval not displayed.  Liver Function Tests:  Recent Labs Lab 01/23/16 1600 01/24/16 0420 01/24/16 1559 01/25/16 0422 01/26/16 0425  AST  --   --   --  45*  --   ALT  --   --   --  34  --   ALKPHOS  --   --   --  57  --   BILITOT  --   --   --  1.5*  --   PROT  --   --   --  6.7  --   ALBUMIN 3.6 3.5 3.3* 3.3*  3.2* 3.1*   No results for input(s): LIPASE, AMYLASE in the last 168 hours. No results for input(s): AMMONIA in the last 168  hours.  CBC:  Recent Labs Lab 01/20/16 1546 01/21/16 0432 01/22/16 0513 01/23/16 0429 01/24/16 0420 01/25/16 0422  WBC 9.5 11.2* 9.0 8.3 12.1* 7.8  NEUTROABS 7.8*  --   --   --   --   --   HGB 9.5* 9.7* 9.3* 9.2* 10.4* 9.5*  HCT 29.3* 30.4* 29.3* 28.8* 31.6* 29.7*  MCV 108.5* 108.2* 107.7* 106.7* 107.8* 107.2*  PLT 79* 81* 73* 63* 67* 49*    Cardiac Enzymes: No results for input(s): CKTOTAL, CKMB, CKMBINDEX, TROPONINI in the last 168 hours.  BNP: Invalid input(s): POCBNP  CBG:  Recent Labs Lab 01/25/16 0754 01/25/16 1144 01/25/16 1543 01/25/16 1955 01/26/16 0747  GLUCAP 204* 230* 135* 152* 180*    Microbiology: Results for orders placed or performed during the  hospital encounter of 01/13/16  Surgical pcr screen     Status: None   Collection Time: 01/16/16 12:14 PM  Result Value Ref Range Status   MRSA, PCR NEGATIVE NEGATIVE Final   Staphylococcus aureus NEGATIVE NEGATIVE Final    Comment:        The Xpert SA Assay (FDA approved for NASAL specimens in patients over 72 years of age), is one component of a comprehensive surveillance program.  Test performance has been validated by [facility] for patients greater than or equal to 72 year old. It is not intended to diagnose infection nor to guide or monitor treatment.   Urine culture     Status: None   Collection Time: 01/17/16  4:01 PM  Result Value Ref Range Status   Specimen Description URINE, CATHETERIZED  Final   Special Requests NONE  Final   Culture NO GROWTH 2 DAYS  Final   Report Status 01/19/2016 FINAL  Final  Culture, blood (Routine X 2) w Reflex to ID Panel     Status: None   Collection Time: 01/17/16  5:50 PM  Result Value Ref Range Status   Specimen Description BLOOD RIGHT HAND  Final   Special Requests IN PEDIATRIC BOTTLE 1CC  Final   Culture NO GROWTH 5 DAYS  Final   Report Status 01/22/2016 FINAL  Final  Culture, blood (Routine X 2) w Reflex to ID Panel     Status: None    Collection Time: 01/17/16  6:01 PM  Result Value Ref Range Status   Specimen Description BLOOD LEFT HAND  Final   Special Requests IN PEDIATRIC BOTTLE 1CC  Final   Culture NO GROWTH 5 DAYS  Final   Report Status 01/22/2016 FINAL  Final    Coagulation Studies: No results for input(s): LABPROT, INR in the last 72 hours.  Urinalysis: No results for input(s): COLORURINE, LABSPEC, PHURINE, GLUCOSEU, HGBUR, BILIRUBINUR, KETONESUR, PROTEINUR, UROBILINOGEN, NITRITE, LEUKOCYTESUR in the last 72 hours.  Invalid input(s): APPERANCEUR    Imaging: No results found.   Medications:   . norepinephrine (LEVOPHED) Adult infusion Stopped (01/25/16 1200)  . vasopressin (PITRESSIN) infusion - *FOR SHOCK* 0.03 Units/min (01/26/16 0600)   . albumin human  12.5 g Intravenous Once  . darbepoetin (ARANESP) injection - NON-DIALYSIS  100 mcg Subcutaneous Q Thu-1800  . hydrocortisone sod succinate (SOLU-CORTEF) inj  50 mg Intravenous Q6H  . insulin aspart  0-5 Units Subcutaneous QHS  . insulin aspart  0-9 Units Subcutaneous TID WC  . lactulose  15 g Per Tube BID  . pantoprazole sodium  40 mg Per Tube QHS  . sodium chloride flush  3 mL Intravenous Q12H   sodium chloride, Place/Maintain arterial line **AND** sodium chloride, ipratropium-albuterol, naLOXone (NARCAN)  injection, ondansetron (ZOFRAN) IV, sodium chloride flush  Assessment/ Plan:  1. Right heart failure and hypotension with pul hypertension.  2. Prior diagnosis of cirrhosis questionable etiology  3. Subacute renal failure currently off of CVVH awaiting  Following renal function 4. Right heart failure unclear etiology. Unclear whether this is due to morbid obesity with obesity hypoventilation, subacute pulmonary emboli, or sleep apnea.  5. Thrombocytopenia, macrocytosis, mildly low albumin and evidence of cirrhosis of which could be markers of cirrhosis 6. Bladder outlet obstruction will leave in follow    LOS: 13 Maurice Paul  W @TODAY @9 :58 AM

## 2016-01-27 DIAGNOSIS — R5381 Other malaise: Secondary | ICD-10-CM

## 2016-01-27 LAB — GLUCOSE, CAPILLARY
GLUCOSE-CAPILLARY: 166 mg/dL — AB (ref 65–99)
GLUCOSE-CAPILLARY: 158 mg/dL — AB (ref 65–99)
GLUCOSE-CAPILLARY: 157 mg/dL — AB (ref 65–99)
GLUCOSE-CAPILLARY: 146 mg/dL — AB (ref 65–99)

## 2016-01-27 LAB — RENAL FUNCTION PANEL
ALBUMIN: 3.3 g/dL — AB (ref 3.5–5.0)
Anion gap: 11 (ref 5–15)
BUN: 47 mg/dL — AB (ref 6–20)
CALCIUM: 8.8 mg/dL — AB (ref 8.9–10.3)
CO2: 23 mmol/L (ref 22–32)
CREATININE: 4.45 mg/dL — AB (ref 0.61–1.24)
Chloride: 100 mmol/L — ABNORMAL LOW (ref 101–111)
GFR calc Af Amer: 14 mL/min — ABNORMAL LOW (ref >60)
GFR, EST NON AFRICAN AMERICAN: 12 mL/min — AB (ref >60)
GLUCOSE: 156 mg/dL — AB (ref 65–99)
PHOSPHORUS: 5 mg/dL — AB (ref 2.5–4.6)
POTASSIUM: 3.8 mmol/L (ref 3.5–5.1)
SODIUM: 134 mmol/L — AB (ref 135–145)

## 2016-01-27 LAB — CBC
HCT: 29 % — ABNORMAL LOW (ref 39.0–52.0)
HEMOGLOBIN: 9.4 g/dL — AB (ref 13.0–17.0)
MCH: 34.4 pg — AB (ref 26.0–34.0)
MCHC: 32.4 g/dL (ref 30.0–36.0)
MCV: 106.2 fL — ABNORMAL HIGH (ref 78.0–100.0)
Platelets: 47 10*3/uL — ABNORMAL LOW (ref 150–400)
RBC: 2.73 MIL/uL — ABNORMAL LOW (ref 4.22–5.81)
RDW: 17.9 % — AB (ref 11.5–15.5)
WBC: 11.2 10*3/uL — ABNORMAL HIGH (ref 4.0–10.5)

## 2016-01-27 LAB — MAGNESIUM: MAGNESIUM: 2.6 mg/dL — AB (ref 1.7–2.4)

## 2016-01-27 MED ORDER — HYDROCORTISONE NA SUCCINATE PF 100 MG IJ SOLR
50.0000 mg | Freq: Two times a day (BID) | INTRAMUSCULAR | Status: DC
  Administered 2016-01-27 – 2016-01-28 (×2): 50 mg via INTRAVENOUS
  Filled 2016-01-27 (×2): qty 2

## 2016-01-27 MED ORDER — SODIUM CHLORIDE 0.9% FLUSH
10.0000 mL | INTRAVENOUS | Status: DC | PRN
  Administered 2016-01-27 – 2016-01-28 (×3): 30 mL
  Administered 2016-01-29: 10 mL
  Administered 2016-01-29: 20 mL
  Administered 2016-01-29 – 2016-02-02 (×4): 30 mL
  Filled 2016-01-27 (×9): qty 40

## 2016-01-27 MED ORDER — LACTULOSE 10 GM/15ML PO SOLN
20.0000 g | Freq: Two times a day (BID) | ORAL | Status: DC
  Administered 2016-01-27 – 2016-02-01 (×11): 20 g via ORAL
  Filled 2016-01-27 (×11): qty 30

## 2016-01-27 NOTE — Progress Notes (Signed)
Rehab admissions - i met with patient, his wife and his daughter in the ICU earlier today.  I gave wife rehab booklets and talked about inpatient rehab.  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  I have called and faxed information to [facility] requesting acute inpatient rehab admission.  I will follow up after I hear back from insurance case manager.  Call me for questions.  #[phone]

## 2016-01-27 NOTE — Progress Notes (Signed)
[facility] ROUNDING NOTE   Subjective:   Interval History:  CRRT stopped 4/19.  Has bladder outlet obstruction.  Patient reports that he is feeling ok.  Hoping to be transferred to floor soon.  Objective:  Vital signs in last 24 hours:  Temp:  [97.4 F (36.3 C)-98.1 F (36.7 C)] 97.4 F (36.3 C) (04/20 1112) Pulse Rate:  [63-81] 75 (04/20 1000) Resp:  [10-23] 15 (04/20 1000) BP: (92-133)/(41-58) 119/55 mmHg (04/20 1000) SpO2:  [93 %-100 %] 100 % (04/20 1000) Weight:  [303 lb 2.1 oz (137.5 kg)] 303 lb 2.1 oz (137.5 kg) (04/20 0100)  Weight change: -5 lb 8.2 oz (-2.5 kg) Filed Weights   01/25/16 0414 01/26/16 0500 01/27/16 0100  Weight: 306 lb (138.8 kg) 308 lb 10.3 oz (140 kg) 303 lb 2.1 oz (137.5 kg)    Intake/Output: I/O last 3 completed shifts: In: 1744.3 [P.O.:840; I.V.:704.3; Other:200] Out: 307 [Urine:307]   Intake/Output this shift:  Total I/O In: 30 [I.V.:30] Out: 45 [Urine:45]  GEN- awake, alert, conversive, wife at bedside CVS- RRR RS- Normal WOB on 2L [place].   ABD- +BS, present soft non-distended GU- foley catheter in place, draining red urine EXT- 3 + LE edema, right forearm with diffuse bruising  Basic Metabolic Panel:  Recent Labs Lab 01/23/16 0429  01/24/16 0420 01/24/16 1559 01/25/16 0422 01/26/16 0425 01/27/16 0515  NA 137  < > 138 134* 136 135 134*  K 3.9  < > 4.1 3.9 4.0 4.1 3.8  CL 102  < > 103 100* 101 103 100*  CO2 25  < > 26 22 24 25 23   GLUCOSE 176*  < > 174* 198* 211* 201* 156*  BUN 22*  < > 20 21* 20 33* 47*  CREATININE 2.88*  < > 2.61* 2.74* 2.40* 3.54* 4.45*  CALCIUM 8.5*  < > 8.7* 8.6* 8.5* 8.8* 8.8*  MG 2.5*  --  2.6*  --  2.5* 2.7* 2.6*  PHOS 2.8  < > 2.9 2.7 3.5 4.3 5.0*  < > = values in this interval not displayed.  Liver Function Tests:  Recent Labs Lab 01/24/16 0420 01/24/16 1559 01/25/16 0422 01/26/16 0425 01/27/16 0515  AST  --   --  45*  --   --   ALT  --   --  34  --   --   ALKPHOS  --   --   57  --   --   BILITOT  --   --  1.5*  --   --   PROT  --   --  6.7  --   --   ALBUMIN 3.5 3.3* 3.3*  3.2* 3.1* 3.3*   No results for input(s): LIPASE, AMYLASE in the last 168 hours. No results for input(s): AMMONIA in the last 168 hours.  CBC:  Recent Labs Lab 01/20/16 1546  01/22/16 0513 01/23/16 0429 01/24/16 0420 01/25/16 0422 01/27/16 0515  WBC 9.5  < > 9.0 8.3 12.1* 7.8 11.2*  NEUTROABS 7.8*  --   --   --   --   --   --   HGB 9.5*  < > 9.3* 9.2* 10.4* 9.5* 9.4*  HCT 29.3*  < > 29.3* 28.8* 31.6* 29.7* 29.0*  MCV 108.5*  < > 107.7* 106.7* 107.8* 107.2* 106.2*  PLT 79*  < > 73* 63* 67* 49* 47*  < > = values in this interval not displayed.  Cardiac Enzymes: No results for input(s): CKTOTAL, CKMB, CKMBINDEX, TROPONINI  in the last 168 hours.  BNP: Invalid input(s): POCBNP  CBG:  Recent Labs Lab 01/26/16 0747 01/26/16 1128 01/26/16 1825 01/26/16 2228 01/27/16 0830  GLUCAP 180* 198* 148* 179* 166*    Microbiology: Results for orders placed or performed during the hospital encounter of 01/13/16  Surgical pcr screen     Status: None   Collection Time: 01/16/16 12:14 PM  Result Value Ref Range Status   MRSA, PCR NEGATIVE NEGATIVE Final   Staphylococcus aureus NEGATIVE NEGATIVE Final    Comment:        The Xpert SA Assay (FDA approved for NASAL specimens in patients over 19 years of age), is one component of a comprehensive surveillance program.  Test performance has been validated by [facility] for patients greater than or equal to 68 year old. It is not intended to diagnose infection nor to guide or monitor treatment.   Urine culture     Status: None   Collection Time: 01/17/16  4:01 PM  Result Value Ref Range Status   Specimen Description URINE, CATHETERIZED  Final   Special Requests NONE  Final   Culture NO GROWTH 2 DAYS  Final   Report Status 01/19/2016 FINAL  Final  Culture, blood (Routine X 2) w Reflex to ID Panel     Status: None   Collection  Time: 01/17/16  5:50 PM  Result Value Ref Range Status   Specimen Description BLOOD RIGHT HAND  Final   Special Requests IN PEDIATRIC BOTTLE 1CC  Final   Culture NO GROWTH 5 DAYS  Final   Report Status 01/22/2016 FINAL  Final  Culture, blood (Routine X 2) w Reflex to ID Panel     Status: None   Collection Time: 01/17/16  6:01 PM  Result Value Ref Range Status   Specimen Description BLOOD LEFT HAND  Final   Special Requests IN PEDIATRIC BOTTLE 1CC  Final   Culture NO GROWTH 5 DAYS  Final   Report Status 01/22/2016 FINAL  Final    Coagulation Studies: No results for input(s): LABPROT, INR in the last 72 hours.  Urinalysis: No results for input(s): COLORURINE, LABSPEC, PHURINE, GLUCOSEU, HGBUR, BILIRUBINUR, KETONESUR, PROTEINUR, UROBILINOGEN, NITRITE, LEUKOCYTESUR in the last 72 hours.  Invalid input(s): APPERANCEUR    Imaging: No results found.   Medications:     . darbepoetin (ARANESP) injection - NON-DIALYSIS  100 mcg Subcutaneous Q Thu-1800  . hydrocortisone sod succinate (SOLU-CORTEF) inj  50 mg Intravenous Q12H  . insulin aspart  0-5 Units Subcutaneous QHS  . insulin aspart  0-9 Units Subcutaneous TID WC  . lactulose  20 g Oral BID  . pantoprazole sodium  40 mg Per Tube QHS  . sodium chloride flush  3 mL Intravenous Q12H   sodium chloride, ipratropium-albuterol, midodrine, naLOXone (NARCAN)  injection, ondansetron (ZOFRAN) IV, sodium chloride flush  Assessment/ Plan:  1. Right heart failure and hypotension with pul hypertension.  2. Prior diagnosis of cirrhosis questionable etiology  3. Subacute renal failure currently off of CVVH.  Cr worse than yesterday at 4.45, but making some urine (~300cc/24 hours).  Will continue to hold off on CRRT for now.  Following renal function 4. Right heart failure unclear etiology. Unclear whether this is due to morbid obesity with obesity hypoventilation, subacute pulmonary emboli, or sleep apnea.  5. Thrombocytopenia,  macrocytosis, mildly low albumin and evidence of cirrhosis of which could be markers of cirrhosis 6. Bladder outlet obstruction will leave foley in and continue to follow  LOS: 14 Cyan Clippinger, DO 01/27/16,11:41 AM

## 2016-01-27 NOTE — Consult Note (Signed)
Physical Medicine and Rehabilitation Consult   Reason for Consult: Debility due to multiple medical issues.  Referring Physician: Dr. Tyson Alias.    HPI: Maurice Paul is a 70 y.o. male with history of TBI with SDH/SAH, PVD, CKD, DMT2, gastritis, cirrhosis of the liver--question etiology (refused biopsy) who was admitted with acute of chronic renal failure, thrombocytopenia,  progressive weight gain with SOB and malaise.  He was treated with IV diuresis for volume overload and nephrology felt symptoms due to right hear failure, ACE and liver disease. Perm  Cath placed by Dr. Edilia Bo on 04/09 and CRRT intiated. He had MS changes during dialysis after receiving narcotics. He did have N/V with continued hypoxia due to hypercarbic respiratory failure  and there was concerns of sepsis due to ongoing leucocytosis. He was placed on broad spectrum antibiotics and BIPAP not used due to N/V but PCCM recommended close monitoring for worsening of respiratory failure.  BLE dopplers negative for DVT. He required pressors due to septic shock likely due to liver disease and Right hear failure.  Work up ongoing for right heart failure--question due to OHS, subacute PE or sleep apnea. 2 D echo with bubble study with EF 55-60%, no wall abnormality, pulmonary HTN, and dilated right ventricle--no shunt. He continues to have worsening of renal status with urinary retention and thrombocytopenia. PT/OT evaluations done yesterday and patient with decline in mobility and ADL tasks. CIR recommended for follow up therapy.  He reports that he has been sitting up in a chair for the past few days.    Review of Systems  Gastrointestinal: Positive for heartburn. Negative for abdominal pain.      Past Medical History  Diagnosis Date  . PVD (peripheral vascular disease) (HCC)   . Tobacco chew use     for 30 years  . Hypertension   . SDH (subdural hematoma) (HCC) 03/2014    fall  . DVT (deep venous thrombosis) (HCC)       not on anticoagulation due to hematuria  . Stasis dermatitis   . Cirrhosis of liver (HCC)   . Diabetes mellitus, type 2 [facility])      Past Surgical History  Procedure Laterality Date  . Cyst removal trunk    . Tonsillectomy    . Upper gi endoscopy  12/2015  . Insertion of dialysis catheter N/A 01/16/2016    Procedure: INSERTION OF DIALYSIS CATHETER ULTRASOUND GUIDED;  Surgeon: Chuck Hint, MD;  Location: [facility] OR;  Service: Vascular;  Laterality: N/A;    Family History  Problem Relation Age of Onset  . Stroke Father     Social History:   Married. Independent without AD. He  reports that he quit smoking about 17 years ago. He has quit using smokeless tobacco. His smokeless tobacco use included Chew. He reports that he drinks alcohol. He reports that he does not use illicit drugs.    Allergies  Allergen Reactions  . Sulfa Antibiotics     Hives  . Onglyza [Saxagliptin] Rash    Medications Prior to Admission  Medication Sig Dispense Refill  . amLODipine (NORVASC) 2.5 MG tablet Take 2.5 mg by mouth daily.    . cephALEXin (KEFLEX) 500 MG capsule Take 500 mg by mouth 2 (two) times daily.    . furosemide (LASIX) 20 MG tablet Take 20 mg by mouth daily.    Marland Kitchen lisinopril (PRINIVIL,ZESTRIL) 10 MG tablet Take 10 mg by mouth daily.    Marland Kitchen spironolactone (ALDACTONE) 100 MG  tablet Take 100 mg by mouth daily.    Marland Kitchen triamcinolone (KENALOG) 0.025 % cream     . ONGLYZA 2.5 MG TABS tablet       Home: Home Living Family/patient expects to be discharged to:: Private residence Living Arrangements: Spouse/significant other Available Help at Discharge: Family, Available 24 hours/day Type of Home: House Home Access: Stairs to enter Secretary/administrator of Steps: 3 Entrance Stairs-Rails: Left Home Layout: Two level Alternate Level Stairs-Number of Steps: flight Alternate Level Stairs-Rails: Left Bathroom Shower/Tub: Health visitor: Standard Bathroom Accessibility:  No Home Equipment: Cane - single point, Grab bars - tub/shower, Grab bars - toilet Additional Comments: pt uses bathroom which has not been modified, pulls up from toilet using vanity, reports having broken vanities.  Functional History: Prior Function Level of Independence: Independent Comments: furniture walks. Goes into community without device Functional Status:  Mobility: Bed Mobility Overal bed mobility: Needs Assistance Bed Mobility: Supine to Sit, Sit to Supine Supine to sit: Mod assist, +2 for physical assistance, HOB elevated Sit to supine: Mod assist, +2 for physical assistance General bed mobility comments: pt does A with bed mobility, but does require A for Bil LEs and trunk. Transfers Overall transfer level: Needs assistance Equipment used: 2 person hand held assist Transfers: Sit to/from Stand Sit to Stand: Min assist, +2 physical assistance General transfer comment: Placed chair back in front of pt for UE support once standing.  pt able to come to stand and perform pre-gait activities weight shifting and taking steps in place.   Ambulation/Gait Ambulation/Gait assistance: Min assist, +2 physical assistance Ambulation Distance (Feet):  (Side steps) Assistive device:  (Chair back) Gait Pattern/deviations: Step-through pattern, Decreased stride length, Wide base of support General Gait Details: Side steps towards HOB x4.  pt maintained O2 sats during side steps.   Gait velocity: decreased Gait velocity interpretation: Below normal speed for age/gender    ADL: ADL Overall ADL's : Needs assistance/impaired Eating/Feeding: Independent, Sitting Grooming: Wash/dry face, Set up, Sitting Upper Body Bathing: Set up, Sitting Lower Body Bathing: Moderate assistance, Sit to/from stand Upper Body Dressing : Set up, Sitting Lower Body Dressing: Moderate assistance, Sit to/from stand Toilet Transfer: Minimal assistance, +2 for physical assistance Toileting- Clothing  Manipulation and Hygiene: Total assistance Functional mobility during ADLs: Supervision/safety (stabilizes on sink, door knob, footboard of bed) General ADL Comments: Pt eager to participate.   Cognition: Cognition Overall Cognitive Status: Impaired/Different from baseline Orientation Level: Oriented to person, Oriented to place Cognition Arousal/Alertness: Awake/alert Behavior During Therapy: WFL for tasks assessed/performed Overall Cognitive Status: Impaired/Different from baseline General Comments: pt oriented, but at times talks off topic.  Unclear if this is baseline for pt.     Blood pressure 92/43, pulse 68, temperature 97.6 F (36.4 C), temperature source Oral, resp. rate 12, height 5\' 10"  (1.778 m), weight 137.5 kg (303 lb 2.1 oz), SpO2 96 %. Physical Exam  Nursing note and vitals reviewed. Constitutional: He is oriented to person, place, and time. He appears well-developed and well-nourished. Nasal cannula in place.  HENT:  Head: Normocephalic and atraumatic.  Mouth/Throat: Oropharynx is clear and moist.  Eyes: Conjunctivae are normal. Pupils are equal, round, and reactive to light.  Neck: Normal range of motion. Neck supple.  Central line left neck with surrounding ecchymosis.   Cardiovascular: Normal rate and regular rhythm.   Respiratory: Effort normal and breath sounds normal. No respiratory distress. He has no wheezes.  GI: Soft. Bowel sounds are normal. He exhibits no distension.  There is no tenderness.  Genitourinary:  Foley tubing and bag with bloody urine.   Musculoskeletal: He exhibits edema and tenderness.  2+ edema pedally > tibially.   Neurological: He is alert and oriented to person, place, and time.  Speech clear. Able to answer orientation questions--did have difficulty recalling Pres. He was able to follow simple motor commands without difficulty.  UE 3+ to 4/5. RLE 1+ prox to 3- distally. LLE 2 prox to 3 distally.   Skin: Skin is warm and dry.  Diffuse  ecchymosis right forearm.  BLE with stasis dermatitis and resolving cellulitis with blistering.   Psychiatric: He has a normal mood and affect. His behavior is normal.    Results for orders placed or performed during the hospital encounter of 01/13/16 (from the past 24 hour(s))  Glucose, capillary     Status: Abnormal   Collection Time: 01/26/16 11:28 AM  Result Value Ref Range   Glucose-Capillary 198 (H) 65 - 99 mg/dL  Glucose, capillary     Status: Abnormal   Collection Time: 01/26/16  6:25 PM  Result Value Ref Range   Glucose-Capillary 148 (H) 65 - 99 mg/dL  Glucose, capillary     Status: Abnormal   Collection Time: 01/26/16 10:28 PM  Result Value Ref Range   Glucose-Capillary 179 (H) 65 - 99 mg/dL  Renal function panel (daily at 0500)     Status: Abnormal   Collection Time: 01/27/16  5:15 AM  Result Value Ref Range   Sodium 134 (L) 135 - 145 mmol/L   Potassium 3.8 3.5 - 5.1 mmol/L   Chloride 100 (L) 101 - 111 mmol/L   CO2 23 22 - 32 mmol/L   Glucose, Bld 156 (H) 65 - 99 mg/dL   BUN 47 (H) 6 - 20 mg/dL   Creatinine, Ser 1.614.45 (H) 0.61 - 1.24 mg/dL   Calcium 8.8 (L) 8.9 - 10.3 mg/dL   Phosphorus 5.0 (H) 2.5 - 4.6 mg/dL   Albumin 3.3 (L) 3.5 - 5.0 g/dL   GFR calc non Af Amer 12 (L) >60 mL/min   GFR calc Af Amer 14 (L) >60 mL/min   Anion gap 11 5 - 15  Magnesium     Status: Abnormal   Collection Time: 01/27/16  5:15 AM  Result Value Ref Range   Magnesium 2.6 (H) 1.7 - 2.4 mg/dL  CBC     Status: Abnormal   Collection Time: 01/27/16  5:15 AM  Result Value Ref Range   WBC 11.2 (H) 4.0 - 10.5 K/uL   RBC 2.73 (L) 4.22 - 5.81 MIL/uL   Hemoglobin 9.4 (L) 13.0 - 17.0 g/dL   HCT 09.629.0 (L) 04.539.0 - 40.952.0 %   MCV 106.2 (H) 78.0 - 100.0 fL   MCH 34.4 (H) 26.0 - 34.0 pg   MCHC 32.4 30.0 - 36.0 g/dL   RDW 81.117.9 (H) 91.411.5 - 78.215.5 %   Platelets 47 (L) 150 - 400 K/uL   No results found.  Assessment/Plan: Diagnosis: debility related to multiple medical issues above 1. Does the need for  close, 24 hr/day medical supervision in concert with the patient's rehab needs make it unreasonable for this patient to be served in a less intensive setting? Yes 2. Co-Morbidities requiring supervision/potential complications: chf, cellulitis, dm2, htn, morbid obesity, pulmonary hypertension 3. Due to bladder management, bowel management, safety, skin/wound care, disease management, medication administration, pain management and patient education, does the patient require 24 hr/day rehab nursing? Yes 4. Does the patient require coordinated  care of a physician, rehab nurse, PT (1-2 hrs/day, 5 days/week) and OT (1-2 hrs/day, 5 days/week) to address physical and functional deficits in the context of the above medical diagnosis(es)? Yes Addressing deficits in the following areas: balance, endurance, locomotion, strength, transferring, bowel/bladder control, bathing, dressing, feeding, grooming, toileting and psychosocial support 5. Can the patient actively participate in an intensive therapy program of at least 3 hrs of therapy per day at least 5 days per week? Yes 6. The potential for patient to make measurable gains while on inpatient rehab is excellent 7. Anticipated functional outcomes upon discharge from inpatient rehab are modified independent and supervision  with PT, modified independent and supervision with OT, n/a with SLP. 8. Estimated rehab length of stay to reach the above functional goals is: 10-14 days 9. Does the patient have adequate social supports and living environment to accommodate these discharge functional goals? Yes 10. Anticipated D/C setting: Home 11. Anticipated post D/C treatments: HH therapy and Outpatient therapy 12. Overall Rehab/Functional Prognosis: excellent  RECOMMENDATIONS: This patient's condition is appropriate for continued rehabilitative care in the following setting: CIR Patient has agreed to participate in recommended program. Yes Note that insurance prior  authorization may be required for reimbursement for recommended care.  Comment: Rehab Admissions Coordinator to follow up.  Thanks,  Ranelle Oyster, MD, [place] [facility]     01/27/2016

## 2016-01-27 NOTE — Progress Notes (Signed)
PULMONARY / CRITICAL CARE MEDICINE   Name: Maurice BaizeWalter S Varas MRN: [id number] DOB: 11/05/1945    ADMISSION DATE:  01/13/2016 CONSULTATION DATE:  01/17/16  REFERRING MD:  Dr. 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    The patient reported on admission that he had gained 4-5 lbs a week for the month prior to admission.  He also had been treated for LE cellulitis with antibiotics and una-boots without significant relief (most recently has been treated with Keflex).  Patient also reported an obvious swelling in his lower extremities and abdominal distention.  Initial ER evaluation concerning for congestive heart failure, elevated troponin and acute kidney injury. Labs-NA 133, K5.2, CR 2.82, troponin 0.25, WBC 7.4, Hgb 11.0, MCV 106.1 and platelets 104. Initial chest x-ray with vascular congestion but no frank pulmonary edema, small bilateral pleural effusions. The patient was admitted per [facility] hospitalist for further evaluation.  The patient was evaluated by nephrology and felt to have subacute renal insufficiency in the setting of liver disease, ACE inhibitor use and right heart failure. He ultimately underwent placement of a PermCath per Dr. Edilia Boickson on 4/9.  He also was evaluated by Dr. Donnie Ahoilley of cardiology for right heart failure of unclear etiology (right heart failure vs cirrhosis vs OSA/OHS vs PE or combination).    Of note, the patient recently had an EGD completed by Dr. Ewing SchleinMagod on 3/29 which showed evidence of gastritis but no other  abnormality.  He previously was treated with Lasix and Aldactone.  The patient was noted to have altered mental status a.m. of 4/10.  During dialysis he was treated with narcotics for pain. After return from HD to the medical floor he was noted to be more altered and an ABG was obtained which reflected hypercarbic respiratory failure (acute on chronic). PCCM consulted for evaluation.    Patient's wife denies known history of snoring or periods of apnea. 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  SUBJECTIVE:  Feels well this morning, slept better last night than he has in a while Remains off pressor sover 24 hrs  VITAL SIGNS: BP 92/43 mmHg  Pulse 68  Temp(Src) 98.1 F (36.7 C) (Oral)  Resp 12  Ht 5\' 10"  (1.778 m)  Wt 303 lb 2.1 oz (137.5 kg)  BMI 43.49 kg/m2  SpO2 96%  HEMODYNAMICS: CVP:  [19 mmHg] 19 mmHg  VENTILATOR SETTINGS:    INTAKE / OUTPUT: I/O last 3 completed shifts: In: 1744.3 [P.O.:840; I.V.:704.3; Other:200] Out: 307 [Urine:307]  PHYSICAL EXAMINATION: General:  Obese male lying in bed, NAD Neuro:  AAOx4, moving all 4 extremities, no  asterixis  HEENT:   unable to appreciate JVD, L perm cath present, Right IJ clean with no bleeding, supplemental O2 via [place] 2L Cardiovascular:  s1s2 no s3 rrr, no m/r/g Lungs:  CTA Abdomen:  soft, distended but not tense, bs hypoactive  Musculoskeletal:  No acute deformities, BLE 3+ edema SCDs in place Skin:  Warm/dry, chronic venous stasis dermatitis  LABS:  BMET  Recent Labs Lab 01/25/16 0422 01/26/16 0425 01/27/16 0515  NA 136 135 134*  K 4.0 4.1 3.8  CL 101 103 100*  CO2 BUN 20 33* 47*  CREATININE 2.40* 3.54* 4.45*  GLUCOSE 211* 201* 156*    Electrolytes  Recent Labs Lab 01/25/16 0422 01/26/16 0425 01/27/16 0515  CALCIUM 8.5* 8.8* 8.8*  MG 2.5* 2.7* 2.6*  PHOS 3.5 4.3 5.0*    CBC  Recent Labs Lab 01/24/16 0420 01/25/16 0422 01/27/16 0515  WBC 12.1* 7.8 11.2*  HGB  10.4* 9.5* 9.4*  HCT 31.6* 29.7* 29.0*  PLT 67* 49* 47*    Coag's  Recent Labs Lab 01/21/16 0432 01/22/16 0513  APTT 35 35    Sepsis Markers  Recent Labs Lab 01/21/16 0432  PROCALCITON 0.25    ABG No results for input(s): PHART, PCO2ART, PO2ART in the last 168 hours.  Liver Enzymes  Recent Labs Lab 01/25/16 0422 01/26/16 0425 01/27/16 0515  AST 45*  --   --   ALT 34  --   --   ALKPHOS 57  --   --   BILITOT 1.5*  --   --   ALBUMIN 3.3*  3.2* 3.1* 3.3*    Cardiac Enzymes No results for input(s): TROPONINI, PROBNP in the last 168 hours.  Glucose  Recent Labs Lab 01/25/16 1543 01/25/16 1955 01/26/16 0747 01/26/16 1128 01/26/16 1825 01/26/16 2228  GLUCAP 135* 152* 180* 198* 148* 179*    Imaging No results found.   STUDIES:  4/07  LE Doppler >> no evidence of DVT bilaterally 4/07  ABD [place] >> normal amount of ascites, hepatic cirrhosis 4/07  Renal US >> small right renal cyst, otherwise kidneys unremarkable 4/08  ECHO >> EF 60-65%, normal wall motion, mild systolic flattening of ventricular septum c/w RV pressure overload, mild MR, LA moderately dilated, RV mildly dilated, RA mod to severely dilated, moderate TR, PA Peak 45 mm Hg 4/13 [place] Abd Doppler: Patent portal veins, perihepatic ascites.  4/19 Echo with bubble study: PA pressure 55, no evidence of atrial shunt  CULTURES: BCx2 4/10 >> NG  UA 4/10 >> TNTC RBC, 6-30 WBC, Nitrite neg UC 4/10 >> NG Sputum not collected  ANTIBIOTICS: Vanco 4/10 >> 4/12 Zosyn 4/10 >> 4/13 Ceftriaxone 4/13>>4/17  SIGNIFICANT EVENTS: 4/06  Admit with weight gain, swelling  4/17- remains in shock , on pressors,  4/18 cvvhd discontinued 4/19- vaso still needed 4/20 Vaso off  LINES/TUBES: R IJ CVC 4/10 >>  Portacath 4/9>>  DISCUSSION: 70 year old male admitted 4/6 with dyspnea and weight gain. Found to have subacute renal failure & CHF - ? In setting of liver disease, ? Decompensated cor pulmonale.  He  underwent placement of a PermCath on 4/9 (local + sedation).  During dialysis (2 L removed) on 4/10 he also received narcotics for pain.  ? If acute mental status changes are related to medications in the setting of poor renal/hepatic clearance.   He later developed nausea and vomiting. Concern for possible aspiration but no overt infiltrate post vomiting on CXR.   ASSESSMENT / PLAN:  PULMONARY A: Acute on chronic hypercarbic respiratory failure -  Pulmonary hypertension - PA peak 45 on echo, large right heart border noted on chest x-ray 0- clinical low suspcion ? Decompensated cor pulmonale ? Hepatopulmonary syndrome>> no evidence on bubble echo study for atrial shunt ? OSA / OHS P:   Oxygen to support saturations > 90% currently on 2L supplemental  O2 via [place] Pulmonary hygiene-IS, mobilize as able Lower extremity dopplers negative, doubt PE. Heparin stopped  Will need outpatient sleep study  Contrast Echo with bubble study no evidence of atrial shunt, will call cards to tell us about timing of bubbles from vent to ventr, number of beats  CARDIOVASCULAR A:  Shock, presumed septic. ? Contribution liver disease, R heart failure- resolved Hypertension PVD / chronic venous stasis PA 55 P:  cvp BID Vasopressin off Continued stress roids, to 50 q12h MAp 55 with good MS- successful on own Midodrine if needed for BP  RENAL A:   Acute Renal Failure- now oliguric P:   Trend BMP / UOP>> ~300cc UOP bloody HD schedule per renal Lasix in future per renal  GASTROINTESTINAL A:   Morbid obesity cirhosis- cryptogenic P:   renal diet U/S with Duplex shows no portal venous thrombus ppi x 2 more days as steroids taper   HEMATOLOGIC A:   Macrocytic anemia  Thrombocytopenia due to cirrhosis  Leukocytosis - resolved with broad spectrum ABx R/O PE - unable to complete CTA due to CAD, lower extremity doppler negative- unlikely cause at this point and do not feel further workup acutely is  needed P:  Trend CBC, platelets, needed in am  Bleeding urine, holding sub q hep SCDs for VTE PPx  INFECTIOUS A:   Sepsis- possible urinary source versus SBP  P:   Culture negative, completed 7 day course of Abx Monitor fever curve / WBC - has trended down Low threshold repeat BC  ENDOCRINE A:   Diabetes  AI P:   CBG every 4 with sliding scale insulin  Stress roids- titrate off over next 4 days Range 140-180  NEUROLOGIC A:   Acute encephalopathy - suspect metabolic with narcotics plus acute on chronic kidney and liver disease.  Improved P:   RASS goal: n/a Minimize sedating medications as able When necessary low-dose fentanyl for pain, try to minimize lactulose to 20g BID, want to titrate to 2-3 loose BM per day   No data found.    FAMILY  - Updates: wife, son and daughter updated at bedside 4/18 by df   - Inter-disciplinary family meet or Palliative Care meeting due by: 4/17  Gust Rung, DO IMTS PGY-3 01/27/2016, 7:24 AM   STAFF NOTE: Cindi Carbon, MD FACP have personally reviewed patient's available data, including medical history, events of note, physical examination and test results as part of my evaluation. I have discussed with resident/NP and other care providers such as pharmacist, RN and RRT. In addition, I personally evaluated patient and elicited key findings of: awake, no distress, clear lungs, no pressors x 24 hrs greater, sys is 117 now, midodrine prn added, allow slight pos balance, hd per renal, need to d/w cards needed info on bubbles appearance as far as hepato pulm syndrome, continued diet, reduce stress steroids to off over 3 days, dc ppi at that time also, mobilize, PT consulted, to traid, med floor, I spoke with GI Dr Ewing Schlein, work up has been done, it is unclear etiology at this time and his LFT are not active  Mcarthur Rossetti. Tyson Alias, MD, FACP Pgr: [phone] [facility] 01/27/2016 9:01 AM

## 2016-01-27 NOTE — Progress Notes (Addendum)
Iv team was unable to get peripherial iv at this time.  Will page MD for clarification if it is ok to leave IJ in for now.   3:03 PM MD said ok to leave right IJ for now

## 2016-01-27 NOTE — Progress Notes (Signed)
NURSING PROGRESS NOTE  Maurice Paul [id number] Transfer Data: 01/27/2016 2:32 PM Attending Provider: Leslye Peerobert S Byrum, MD HYQ:MVHQ,IONGEXBPCP:WONG,FRANCIS PATRICK, MD Code Status: full  Maurice BaizeWalter S Paul is a 70 y.o. male patient transferred from 72M -No acute distress noted.  -No complaints of shortness of breath.  -No complaints of chest pain.   Cardiac Monitoring: Box # 15 in place. Cardiac monitor yields:normal sinus rhythm.  Blood pressure 110/49, pulse 73, temperature 97.6 F (36.4 C), temperature source Oral, resp. rate 20, height 5\' 10"  (1.778 m), weight 137.44 kg (303 lb), SpO2 99 %.   IV Fluids:  IV in place, occlusive dsg intact without redness, IV cath internal jugular right, condition patent and no redness and infusing  none.   Allergies:  Sulfa antibiotics and Onglyza  Past Medical History:   has a past medical history of PVD (peripheral vascular disease) (HCC); Tobacco chew use; Hypertension; SDH (subdural hematoma) (HCC) (03/2014); DVT (deep venous thrombosis) (HCC); Stasis dermatitis; Cirrhosis of liver (HCC); and Diabetes mellitus, type 2 (HCC).  Past Surgical History:   has past surgical history that includes Cyst removal trunk; Tonsillectomy; Upper gi endoscopy (12/2015); and Insertion of dialysis catheter (N/A, 01/16/2016).  Social History:   reports that he quit smoking about 17 years ago. He has quit using smokeless tobacco. His smokeless tobacco use included Chew. He reports that he drinks alcohol. He reports that he does not use illicit drugs.  Skin: ecchymosis all over arms, wrist, edema on legs bilaterally  Patient/Family orientated to room. Information packet given to patient/family. Admission inpatient armband information verified with patient/family to include name and date of birth and placed on patient arm. Side rails up x 2, fall assessment and education completed with patient/family. Patient/family able to verbalize understanding of risk associated with falls and  verbalized understanding to call for assistance before getting out of bed. Call light within reach. Patient/family able to voice and demonstrate understanding of unit orientation instructions.    Will continue to evaluate and treat per MD orders.

## 2016-01-27 NOTE — Progress Notes (Signed)
Subjective:  Bubble study showed no evidence of a shunt yesterday.  LV function was good.  The right ventricle was dilated and there was at least moderate pulmonary hypertension with some septal flattening.     Objective:  Vital Signs in the last 24 hours: BP 117/56 mmHg  Pulse 76  Temp(Src) 97.6 F (36.4 C) (Oral)  Resp 15  Ht 5\' 10"  (1.778 m)  Wt 137.5 kg (303 lb 2.1 oz)  BMI 43.49 kg/m2  SpO2 97%  Intake/Output from previous day: 04/19 0701 - 04/20 0700 In: 1510 [P.O.:840; I.V.:470] Out: 307 [Urine:307] Weight Filed Weights   01/25/16 0414 01/26/16 0500 01/27/16 0100  Weight: 138.8 kg (306 lb) 140 kg (308 lb 10.3 oz) 137.5 kg (303 lb 2.1 oz)    Lab Results: Basic Metabolic Panel:  Recent Labs  09/81/1904/19/17 0425 01/27/16 0515  NA 135 134*  K 4.1 3.8  CL 103 100*  CO2 25 23  GLUCOSE 201* 156*  BUN 33* 47*  CREATININE 3.54* 4.45*    CBC:  Recent Labs  01/25/16 0422 01/27/16 0515  WBC 7.8 11.2*  HGB 9.5* 9.4*  HCT 29.7* 29.0*  MCV 107.2* 106.2*  PLT 49* 47*   PROTIME: Lab Results  Component Value Date   INR 1.57* 01/17/2016   INR 1.46 01/14/2016   INR 1.10 12/24/2013    Telemetry: Normal sinus rhythm  Assessment/Plan:  1.  Right heart failure and hypotension with pul hypertension.  No evidence of shunting at the atrial level by bubble study. 2.  Prior diagnosis of cirrhosis questionable etiology  3. Subacute renal failure currently off of CVVH awaiting whther renal function recovers. 4.  Right heart failure unclear etiology.  Unclear whether this is due to morbid obesity with obesity hypoventilation,  subacute pulmonary emboli, or sleep apnea.   5.  Thrombocytopenia, macrocytosis, mildly low albumin and evidence of cirrhosis of which could be markers of cirrhosis  Recommendations:  Bubble study showed no shunting at the atrial level.  The major problem appears to be His renal failure and subsequent plans for that. He also has cirrhosis, right heart  failure and pulmonary hypertension.  The question of pulmonary emboli has not completely been taken off the table.  No additional suggestions at this time.   Darden PalmerW. Spencer Tilley, Jr.  MD [facility]  01/27/2016, 9:15 AM

## 2016-01-27 NOTE — Progress Notes (Signed)
Report received from 2 M. 

## 2016-01-28 ENCOUNTER — Encounter (HOSPITAL_COMMUNITY): Payer: Self-pay | Admitting: Internal Medicine

## 2016-01-28 DIAGNOSIS — I8311 Varicose veins of right lower extremity with inflammation: Secondary | ICD-10-CM

## 2016-01-28 DIAGNOSIS — R894 Abnormal immunological findings in specimens from other organs, systems and tissues: Secondary | ICD-10-CM

## 2016-01-28 DIAGNOSIS — I8312 Varicose veins of left lower extremity with inflammation: Secondary | ICD-10-CM

## 2016-01-28 LAB — RENAL FUNCTION PANEL
ALBUMIN: 3 g/dL — AB (ref 3.5–5.0)
ALBUMIN: 3.2 g/dL — AB (ref 3.5–5.0)
ANION GAP: 13 (ref 5–15)
Anion gap: 11 (ref 5–15)
BUN: 61 mg/dL — ABNORMAL HIGH (ref 6–20)
BUN: 68 mg/dL — AB (ref 6–20)
CHLORIDE: 100 mmol/L — AB (ref 101–111)
CO2: 22 mmol/L (ref 22–32)
CO2: 22 mmol/L (ref 22–32)
Calcium: 8.9 mg/dL (ref 8.9–10.3)
Calcium: 8.8 mg/dL — ABNORMAL LOW (ref 8.9–10.3)
Chloride: 100 mmol/L — ABNORMAL LOW (ref 101–111)
Creatinine, Ser: 5.35 mg/dL — ABNORMAL HIGH (ref 0.61–1.24)
Creatinine, Ser: 5.68 mg/dL — ABNORMAL HIGH (ref 0.61–1.24)
GFR calc Af Amer: 11 mL/min — ABNORMAL LOW (ref >60)
GFR, EST AFRICAN AMERICAN: 10 mL/min — AB (ref >60)
GFR, EST NON AFRICAN AMERICAN: 10 mL/min — AB (ref >60)
GFR, EST NON AFRICAN AMERICAN: 9 mL/min — AB (ref >60)
Glucose, Bld: 145 mg/dL — ABNORMAL HIGH (ref 65–99)
Glucose, Bld: 152 mg/dL — ABNORMAL HIGH (ref 65–99)
PHOSPHORUS: 5.5 mg/dL — AB (ref 2.5–4.6)
POTASSIUM: 3.9 mmol/L (ref 3.5–5.1)
Phosphorus: 5.9 mg/dL — ABNORMAL HIGH (ref 2.5–4.6)
Potassium: 4.1 mmol/L (ref 3.5–5.1)
SODIUM: 133 mmol/L — AB (ref 135–145)
Sodium: 135 mmol/L (ref 135–145)

## 2016-01-28 LAB — HEPATITIS C VRS RNA DETECT BY PCR-QUAL: HEPATITIS C VRS RNA BY PCR-QUAL: NEGATIVE

## 2016-01-28 LAB — GLUCOSE, CAPILLARY
GLUCOSE-CAPILLARY: 141 mg/dL — AB (ref 65–99)
GLUCOSE-CAPILLARY: 137 mg/dL — AB (ref 65–99)
GLUCOSE-CAPILLARY: 146 mg/dL — AB (ref 65–99)

## 2016-01-28 LAB — MAGNESIUM: Magnesium: 2.8 mg/dL — ABNORMAL HIGH (ref 1.7–2.4)

## 2016-01-28 MED ORDER — DARBEPOETIN ALFA 100 MCG/0.5ML IJ SOSY
100.0000 ug | PREFILLED_SYRINGE | INTRAMUSCULAR | Status: DC
  Filled 2016-01-28: qty 0.5

## 2016-01-28 MED ORDER — LIDOCAINE-PRILOCAINE 2.5-2.5 % EX CREA
1.0000 "application " | TOPICAL_CREAM | CUTANEOUS | Status: DC | PRN
  Filled 2016-01-28: qty 5

## 2016-01-28 MED ORDER — ALTEPLASE 2 MG IJ SOLR: 2.0000 mg | Freq: Once | INTRAMUSCULAR | Status: DC | PRN

## 2016-01-28 MED ORDER — LIDOCAINE HCL (PF) 1 % IJ SOLN
5.0000 mL | INTRAMUSCULAR | Status: DC | PRN
  Filled 2016-01-28: qty 5

## 2016-01-28 MED ORDER — SODIUM CHLORIDE 0.9 % IV SOLN: 100.0000 mL | INTRAVENOUS | Status: DC | PRN

## 2016-01-28 MED ORDER — HYDROCORTISONE NA SUCCINATE PF 100 MG IJ SOLR
50.0000 mg | Freq: Every day | INTRAMUSCULAR | Status: DC
  Administered 2016-01-29: 50 mg via INTRAVENOUS
  Filled 2016-01-28: qty 2

## 2016-01-28 MED ORDER — PENTAFLUOROPROP-TETRAFLUOROETH EX AERO: 1.0000 "application " | INHALATION_SPRAY | CUTANEOUS | Status: DC | PRN

## 2016-01-28 MED ORDER — DARBEPOETIN ALFA 100 MCG/0.5ML IJ SOSY
PREFILLED_SYRINGE | INTRAMUSCULAR | Status: AC
  Filled 2016-01-28: qty 0.5

## 2016-01-28 NOTE — Clinical Social Work Placement (Signed)
   CLINICAL SOCIAL WORK PLACEMENT  NOTE  Date:  01/28/2016  Patient Details  Name: Maurice Paul MRN: [id number] Date of Birth: 09/13/1946  Clinical Social Work is seeking post-discharge placement for this patient at the Skilled  Nursing Facility level of care (*CSW will initial, date and re-position this form in  chart as items are completed):      Patient/family provided with [facility] Clinical Social Work Department's list of facilities offering this level of care within the geographic area requested by the patient (or if unable, by the patient's family).      Patient/family informed of their freedom to choose among providers that offer the needed level of care, that participate in Medicare, Medicaid or managed care program needed by the patient, have an available bed and are willing to accept the patient.      Patient/family informed of [place]'s ownership interest in [facility] and [facility], as well as of the fact that they are under no obligation to receive care at these facilities.  PASRR submitted to EDS on       PASRR number received on       Existing PASRR number confirmed on 01/28/16     FL2 transmitted to all facilities in geographic area requested by pt/family on 01/28/16     FL2 transmitted to all facilities within larger geographic area on       Patient informed that his/her managed care company has contracts with or will negotiate with certain facilities, including the following:            Patient/family informed of bed offers received.  Patient chooses bed at       Physician recommends and patient chooses bed at      Patient to be transferred to   on  .  Patient to be transferred to facility by       Patient family notified on   of transfer.  Name of family member notified:        PHYSICIAN Please sign FL2     Additional Comment:    _______________________________________________ Mearl LatinNadia S Reannon Candella, LCSWA 01/28/2016, 2:02 PM

## 2016-01-28 NOTE — Progress Notes (Signed)
Physical Therapy Treatment Patient Details Name: Jewel BaizeWalter S Treese MRN: [id number] DOB: 07/02/1946 Today's Date: 01/28/2016    History of Present Illness pt presents with Anasarca, Acute Renal Failure, Acute on Chronic Respiratory Failure, and Cirrhosis.  pt with hx of HTN, PVD, and DM.      PT Comments    +2 mod assist for supine to sit, +2 min A for several pivotal steps to recliner with RW.   Follow Up Recommendations  CIR     Equipment Recommendations  None recommended by PT    Recommendations for Other Services Rehab consult     Precautions / Restrictions Precautions Precautions: Fall Precaution Comments: monitor O2 and BP Restrictions Weight Bearing Restrictions: No    Mobility  Bed Mobility Overal bed mobility: Needs Assistance Bed Mobility: Supine to Sit     Supine to sit: Mod assist;+2 for physical assistance;HOB elevated     General bed mobility comments: pt does A with bed mobility, but does require A for Bil LEs and trunk.  Transfers Overall transfer level: Needs assistance Equipment used: Rolling walker (2 wheeled) Transfers: Sit to/from Stand Sit to Stand: Min assist;+2 physical assistance         General transfer comment: +2 for safety, verbal cues for hand placement  Ambulation/Gait Ambulation/Gait assistance: Min assist;+2 safety/equipment Ambulation Distance (Feet): 3 Feet Assistive device: Rolling walker (2 wheeled) Gait Pattern/deviations: Step-to pattern;Decreased step length - right;Decreased step length - left;Shuffle Gait velocity: decreased   General Gait Details: several pivotal steps from EOB to recliner, verbal cues for safety, +2 for safety   Stairs            Wheelchair Mobility    Modified Rankin (Stroke Patients Only)       Balance   Sitting-balance support: Feet supported;Bilateral upper extremity supported Sitting balance-Leahy Scale: Poor (posterior lean in sitting)       Standing balance-Leahy Scale:  Poor                      Cognition Arousal/Alertness: Awake/alert Behavior During Therapy: WFL for tasks assessed/performed Overall Cognitive Status: Impaired/Different from baseline                 General Comments: pt oriented, but at times talks off topic.  Unclear if this is baseline for pt.      Exercises General Exercises - Lower Extremity Long Arc Quad: AROM;Both;5 reps;Seated    General Comments        Pertinent Vitals/Pain Pain Assessment: No/denies pain    Home Living                      Prior Function            PT Goals (current goals can now be found in the care plan section) Acute Rehab PT Goals Patient Stated Goal: Get better PT Goal Formulation: With patient Time For Goal Achievement: 02/09/16 Potential to Achieve Goals: Good Progress towards PT goals: Progressing toward goals    Frequency  Min 3X/week    PT Plan Current plan remains appropriate    Co-evaluation             End of Session Equipment Utilized During Treatment: Gait belt;Oxygen Activity Tolerance: Patient tolerated treatment well Patient left: with call bell/phone within reach;in chair;with chair alarm set     Time: [id number] PT Time Calculation (min) (ACUTE ONLY): 24 min  Charges:  $Therapeutic Activity: 23-37 mins  G Codes:      Ralene Bathe Kistler 01/28/2016, 11:09 AM [phone]

## 2016-01-28 NOTE — Progress Notes (Signed)
Patient states nausea and vomiting is caused by his nerves and thoughts. Patient verbalized fear, regret, frustration with current health status. See MAR. RN provided active listening, reassurance and educated patient on CHF risk factors and signs and symptoms. Patient anxiety relieved temporarily but patient continue to verbalize regret and anxiousous. RN will continue to monitor.

## 2016-01-28 NOTE — Progress Notes (Signed)
Patient was repositioned at 2315 01/27/16 and is now asleep and resting well. RN will continue to monitor.

## 2016-01-28 NOTE — Progress Notes (Signed)
Rehab admissions - [facility] for acute inpatient rehab admission.  I spoke with patient, his wife and a family member about the denial.  Case manager is aware and she will inform social worker of need for short term SNF placement.  Call me for questions.  #[phone]

## 2016-01-28 NOTE — [place] FL2 (Signed)
[place] MEDICAID FL2 LEVEL OF CARE SCREENING TOOL     IDENTIFICATION  Patient Name: Maurice Paul Birthdate: 11/24/1945 Sex: male Admission Date (Current Location): 01/13/2016  [facility] [place]   Facility and Address:  [facility], [address], [place], [place] [address]      Provider Number: [id number]  Attending Physician Name and Address:  Maryruth Bunhristina P Rama, MD  Relative Name and Phone Number:  Graciella BeltonDianne, spouse, [phone]    Current Level of Care: Hospital Recommended Level of Care: Skilled Nursing Facility Prior Approval Number:    Date Approved/Denied:   PASRR Number: [id number]  Discharge Plan: SNF    Current Diagnoses: Patient Active Problem List   Diagnosis Date Noted  . Hepatitis C antibody test positive 01/25/2016  . Acute respiratory failure (HCC)   . Ascites   . Acute on chronic respiratory failure (HCC)   . Acute renal injury (HCC)   . Cirrhosis of liver with ascites (HCC)   . Pulmonary hypertension (HCC)   . Cardiogenic shock (HCC)   . AKI (acute kidney injury) (HCC) 01/14/2016  . Elevated troponin 01/14/2016  . Cellulitis 01/14/2016  . Acute on chronic diastolic CHF (congestive heart failure) (HCC) 01/13/2016  . Stasis dermatitis   . Cirrhosis of liver (HCC)   . Diabetes mellitus, type 2 (HCC)   . Traumatic brain injury (HCC) 05/27/2014  . SDH (subdural hematoma) (HCC) 01/08/2014  . DVT (deep venous thrombosis) (HCC) 01/07/2014  . HTN (hypertension) 12/30/2013  . Acute respiratory failure with hypoxia and hypercarbia (HCC) 12/25/2013  . Metabolic encephalopathy 12/25/2013  . Morbid obesity (HCC) 12/25/2013  . SAH (subarachnoid hemorrhage) (HCC) 12/24/2013    Orientation RESPIRATION BLADDER Height & Weight     Self, Time, Situation, Place  O2 (2L/min) Continent Weight: (!) 304 lb 10.8 oz (138.2 kg) Height:  5\' 10"  (177.8 cm)  BEHAVIORAL SYMPTOMS/MOOD NEUROLOGICAL BOWEL NUTRITION STATUS    Continent Diet (Please see DC summary)  AMBULATORY STATUS COMMUNICATION OF NEEDS Skin   Extensive Assist Verbally Surgical wounds (Closed incision on neck)                       Personal Care Assistance Level of Assistance  Bathing, Feeding, Dressing Bathing Assistance: Maximum assistance Feeding assistance: Limited assistance Dressing Assistance: Maximum assistance     Functional Limitations Info             SPECIAL CARE FACTORS FREQUENCY  PT (By licensed PT), OT (By licensed OT)     PT Frequency: min 3x/week OT Frequency: min 2x/week            Contractures      Additional Factors Info  Code Status, Allergies, Insulin Sliding Scale Code Status Info: Full Allergies Info: Sulfa Antibiotics, Onglyza   Insulin Sliding Scale Info: insulin aspart (novoLOG) injection 0-5 Units;insulin aspart (novoLOG) injection 0-9 Units       Current Medications (01/28/2016):  This is the current hospital active medication list Current Facility-Administered Medications  Medication Dose Route Frequency Provider Last Rate Last Dose  . 0.9 %  sodium chloride infusion  250 mL Intravenous PRN Rolly SalterPranav M Patel, MD 10 mL/hr at 01/26/16 2000 250 mL at 01/26/16 2000  . Darbepoetin Alfa (ARANESP) injection 100 mcg  100 mcg Subcutaneous Q Fri-1800 Christina P Rama, MD      . hydrocortisone sodium succinate (SOLU-CORTEF) 100 MG injection 50 mg  50 mg Intravenous Q12H Nelda Bucksaniel J Feinstein,  MD   50 mg at 01/28/16 0938  . insulin aspart (novoLOG) injection 0-5 Units  0-5 Units Subcutaneous QHS Rolly Salter, MD   2 Units at 01/24/16 2203  . insulin aspart (novoLOG) injection 0-9 Units  0-9 Units Subcutaneous TID WC Rolly Salter, MD   1 Units at 01/28/16 1240  . ipratropium-albuterol (DUONEB) 0.5-2.5 (3) MG/3ML nebulizer solution 3 mL  3 mL Nebulization Q4H PRN Nelda Bucks, MD      . lactulose [facility]) 10 GM/15ML solution 20 g  20 g Oral BID Gust Rung, DO   20 g at 01/28/16 0454  .  midodrine (PROAMATINE) tablet 10 mg  10 mg Oral BID PRN Gust Rung, DO      . naloxone [facility]) injection 0.4 mg  0.4 mg Intravenous PRN Maretta Bees, MD   0.4 mg at 01/17/16 1247  . ondansetron (ZOFRAN) injection 4 mg  4 mg Intravenous Q6H PRN Rolly Salter, MD   4 mg at 01/28/16 1032  . pantoprazole sodium (PROTONIX) 40 mg/20 mL oral suspension 40 mg  40 mg Per Tube QHS Nelda Bucks, MD   40 mg at 01/27/16 2313  . sodium chloride flush (NS) 0.9 % injection 10-40 mL  10-40 mL Intracatheter PRN Leslye Peer, MD   30 mL at 01/28/16 0446  . sodium chloride flush (NS) 0.9 % injection 3 mL  3 mL Intravenous Q12H Rolly Salter, MD   3 mL at 01/28/16 1000  . sodium chloride flush (NS) 0.9 % injection 3 mL  3 mL Intravenous PRN Rolly Salter, MD         Discharge Medications: Please see discharge summary for a list of discharge medications.  Relevant Imaging Results:  Relevant Lab Results:   Additional Information SSN: [id number]      Will be starting dialysis.  [name], LCSWA

## 2016-01-28 NOTE — Progress Notes (Signed)
Progress Note    Maurice Paul  ZOX:[id number] DOB: 10-19-1945  DOA: 01/13/2016 PCP: Redmond Baseman, MD   Outpatient Specialists:   None.   Brief Narrative:   Maurice Paul is an 70 y.o. male admitted 01/13/16 with dyspnea and weight gain. Found to have subacute renal failure & CHF - ? In setting of liver disease, ? Decompensated cor pulmonale. He underwent placement of a PermCath on 01/16/16 (local + sedation). During dialysis (2 L removed) on 01/17/16 he also received narcotics for pain. ? If acute mental status changes are related to medications in the setting of poor renal/hepatic clearance. He later developed nausea and vomiting. Concern for possible aspiration but no overt infiltrate post vomiting on CXR.  Assessment/Plan:   Principal Problem:   Acute on chronic diastolic CHF (congestive heart failure) (HCC) with cardiogenic shock resulting in acute on chronic respiratory failure/pulmonary hypertension CHF suspected to be from decompensated cor pulmonale and possible hepatorenal syndrome. Required pressor support on admission with vasopressin, which was weaned on 01/27/16. No atrial shunt noted on 2-D echo with bubble study. EF 55-60 percent with no regional wall motion abnormality but evidence of right-sided heart failure.  Patient likely also has a component of OSA/OHS and will need an outpatient sleep study. Although sepsis was also in the differential, and the patient was thought to have possible septic shock on admission, no confirmed source of infection found and blood cultures, urine culture negative. He did complete a seven-day course of antibiotics. Continue oxygen support as needed to maintain saturations greater than 90%. Currently compensated. Wean stress dose steroids.    Acute on chronic renal failure Possibly secondary to hepatorenal syndrome. Was on lisinopril and spironolactone prior to admission. Renal ultrasound negative for hydronephrosis and normal  echogenicity. Has been having CVVD. Discussion held with nephrology about long-term HD options. Initially, the patient declined, but a family meeting was held with the patient, his wife and his son and he is now amenable to HD. Have requested that the nephrologist discussed this with the family.  Active Problems:   Morbid obesity (HCC) Admission BMI was 48.5. Weight down 36 pounds from admission, likely due to diuresis and massive volume overload. Continue weight loss efforts.    Stasis dermatitis Continue to diurese.    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 There was a minimal amount of ascites present. Encephalopathy appears to have improved and likely was due to hepatic encephalopathy in the setting of being given sedating medications. Continue lactulose.    Diabetes mellitus, type 2 (HCC) Hemoglobin A1c 5.9%. CBGs 141-166. Continue insulin sensitive SSI.    Hypotension Continue midodrine.   Family Communication/Anticipated D/C date and plan/Code Status   DVT prophylaxis: Lovenox ordered. Code Status: Full Code.  Family Communication: Wife updated at the bedside. Disposition Plan: Will need SNF for rehabilitation.   Medical Consultants:    Nephrology  Cardiology  Vascular Surgery  Physical Medicine and Rehabilitation  Pulmonology   Procedures:   Hemodialysis 4/07 LE Doppler >> no evidence of DVT bilaterally 4/07 ABD [place] >> normal amount of ascites, hepatic cirrhosis 4/07 Renal US >> small right renal cyst, otherwise kidneys unremarkable 4/08 ECHO >> EF 60-65%, normal wall motion, mild systolic flattening of ventricular septum c/w RV pressure overload, mild MR, LA moderately dilated, RV mildly dilated, RA mod to severely dilated, moderate TR, PA Peak 45  mm Hg 4/13 US Abd Doppler: Patent portal  veins, perihepatic ascites.  4/19 Echo with bubble study: PA pressure 55, no evidence of atrial shunt  Anti-Infectives:   Vanco 4/10 >> 4/12 Zosyn 4/10 >> 4/13 Ceftriaxone 4/13>>4/17  Subjective:   Maurice Paul denies any significant dyspnea at present. He has had a little bit of nausea. Seems a bit confused but reorients easily. Appetite fair. No current complaints of pain.  Objective:    Filed Vitals:   01/27/16 1300 01/27/16 1420 01/27/16 2116 01/28/16 0610  BP: 118/49 110/49 112/45 115/45  Pulse: 73 73 72 71  Temp:  97.6 F (36.4 C) 97.9 F (36.6 C) 98.9 F (37.2 C)  TempSrc:  Oral Oral   Resp: 14 20 18 19   Height:      Weight:  137.44 kg (303 lb)  138.2 kg (304 lb 10.8 oz)  SpO2: 98% 99% 97% 97%    Intake/Output Summary (Last 24 hours) at 01/28/16 0907 Last data filed at 01/28/16 0728  Gross per 24 hour  Intake    325 ml  Output    170 ml  Net    155 ml   Filed Weights   01/27/16 0100 01/27/16 1420 01/28/16 0610  Weight: 137.5 kg (303 lb 2.1 oz) 137.44 kg (303 lb) 138.2 kg (304 lb 10.8 oz)    Exam: General exam: Appears calm and comfortable. Speech a bit slow. Bloody urine in Foley bag. Respiratory system: Clear to auscultation. Respiratory effort normal. On 3 L of oxygen by nasal cannula. Cardiovascular system: S1 & S2 heard, RRR. No JVD, murmurs, rubs, gallops or clicks. Massive lower extremity edema. Gastrointestinal system: Abdomen is nondistended, soft and nontender. No organomegaly or masses felt. Normal bowel sounds heard. Central nervous system: Alert and oriented to self, place and month. No focal neurological deficits. Extremities: Symmetric 5 x 5 power. Massive lower extremity edema with venous stasis dermatitis. Skin: Venous stasis changes to his lower extremities, massive left arm ecchymosis. Psychiatry: Judgement and insight appear diminished. Mood & affect appropriate.   Data Reviewed:   I have personally reviewed following labs and  imaging studies:  Labs: Basic Metabolic Panel:  Recent Labs Lab 01/24/16 0420 01/24/16 1559 01/25/16 0422 01/26/16 0425 01/27/16 0515 01/28/16 0440  NA 138 134* 136 135 134* 133*  K 4.1 3.9 4.0 4.1 3.8 4.1  CL 103 100* 101 103 100* 100*  CO2 26 22 24 25 23 22   GLUCOSE 174* 198* 211* 201* 156* 145*  BUN 20 21* 20 33* 47* 61*  CREATININE 2.61* 2.74* 2.40* 3.54* 4.45* 5.35*  CALCIUM 8.7* 8.6* 8.5* 8.8* 8.8* 8.9  MG 2.6*  --  2.5* 2.7* 2.6* 2.8*  PHOS 2.9 2.7 3.5 4.3 5.0* 5.9*   GFR Estimated Creatinine Clearance: 17.8 mL/min (by C-G formula based on Cr of 5.35). Liver Function Tests:  Recent Labs Lab 01/24/16 1559 01/25/16 0422 01/26/16 0425 01/27/16 0515 01/28/16 0440  AST  --  45*  --   --   --   ALT  --  34  --   --   --   ALKPHOS  --  57  --   --   --   BILITOT  --  1.5*  --   --   --   PROT  --  6.7  --   --   --   ALBUMIN 3.3* 3.3*  3.2* 3.1* 3.3* 3.0*   CBC:  Recent Labs Lab 01/22/16 0513 01/23/16 0429 01/24/16 0420  01/25/16 0422 01/27/16 0515  WBC 9.0 8.3 12.1* 7.8 11.2*  HGB 9.3* 9.2* 10.4* 9.5* 9.4*  HCT 29.3* 28.8* 31.6* 29.7* 29.0*  MCV 107.7* 106.7* 107.8* 107.2* 106.2*  PLT 73* 63* 67* 49* 47*   CBG:  Recent Labs Lab 01/27/16 0830 01/27/16 1111 01/27/16 1721 01/27/16 2113 01/28/16 0748  GLUCAP 166* 158* 157* 146* 141*   Urine analysis:    Component Value Date/Time   COLORURINE RED* 01/17/2016 1601   APPEARANCEUR TURBID* 01/17/2016 1601   LABSPEC 1.037* 01/17/2016 1601   PHURINE 6.5 01/17/2016 1601   GLUCOSEU NEGATIVE 01/17/2016 1601   HGBUR LARGE* 01/17/2016 1601   BILIRUBINUR MODERATE* 01/17/2016 1601   KETONESUR 15* 01/17/2016 1601   PROTEINUR >300* 01/17/2016 1601   UROBILINOGEN 1.0 01/02/2014 1017   NITRITE POSITIVE* 01/17/2016 1601   LEUKOCYTESUR MODERATE* 01/17/2016 1601   Microbiology No results found for this or any previous visit (from the past 240 hour(s)).  Radiology: No results found.  Medications:   .  darbepoetin (ARANESP) injection - NON-DIALYSIS  100 mcg Subcutaneous Q Fri-1800  . hydrocortisone sod succinate (SOLU-CORTEF) inj  50 mg Intravenous Q12H  . insulin aspart  0-5 Units Subcutaneous QHS  . insulin aspart  0-9 Units Subcutaneous TID WC  . lactulose  20 g Oral BID  . pantoprazole sodium  40 mg Per Tube QHS  . sodium chloride flush  3 mL Intravenous Q12H   Continuous Infusions:   Time spent: 35 minutes.  The patient is medically complex with multiple co-morbidities and is at high risk for clinical deterioration and requires high complexity decision making.    LOS: 15 days   RAMA,CHRISTINA  [facility] Pager [phone]. If unable to reach me by pager, please call my cell phone at [phone].  *Please refer to [email or web address], password [email or web address] to get updated schedule on who will round on this patient, as hospitalists switch teams weekly. If 7PM-7AM, please contact night-coverage at [email or web address], password [email or web address] for any overnight needs.  01/28/2016, 9:07 AM

## 2016-01-28 NOTE — Progress Notes (Signed)
[facility] ROUNDING NOTE   Subjective:   Interval History:  CRRT stopped 4/19.    Now on medical floor.  Discussed worsening renal function.  Discussed consideration of HD.  Patient is NOT amenable to this.  We discussed getting Palliative care on board and he is willing to have a discussion with them.  Objective:  Vital signs in last 24 hours:  Temp:  [97.4 F (36.3 C)-98.9 F (37.2 C)] 98.9 F (37.2 C) (04/21 0610) Pulse Rate:  [71-74] 71 (04/21 0610) Resp:  [13-20] 19 (04/21 0610) BP: (110-123)/(45-65) 115/45 mmHg (04/21 0610) SpO2:  [97 %-99 %] 97 % (04/21 0610) Weight:  [303 lb (137.44 kg)-304 lb 10.8 oz (138.2 kg)] 304 lb 10.8 oz (138.2 kg) (04/21 0610)  Weight change: -2.1 oz (-0.06 kg) Filed Weights   01/27/16 0100 01/27/16 1420 01/28/16 0610  Weight: 303 lb 2.1 oz (137.5 kg) 303 lb (137.44 kg) 304 lb 10.8 oz (138.2 kg)    Intake/Output: I/O last 3 completed shifts: In: 733 [P.O.:360; I.V.:193; Other:180] Out: 402 [Urine:402]   Intake/Output this shift:  Total I/O In: 222 [P.O.:222] Out: -   GEN- awake but drowsy appearing.  Responds to questions but speech slow. CVS- RRR RS- Normal WOB on 3L [place].   ABD- +BS, present soft non-distended EXT- 3-4 + LE edema, right forearm with diffuse bruising  Basic Metabolic Panel:  Recent Labs Lab 01/24/16 0420 01/24/16 1559 01/25/16 0422 01/26/16 0425 01/27/16 0515 01/28/16 0440  NA 138 134* 136 135 134* 133*  K 4.1 3.9 4.0 4.1 3.8 4.1  CL 103 100* 101 103 100* 100*  CO2 GLUCOSE 174* 198* 211* 201* 156* 145*  BUN 20 21* 20 33* 47* 61*  CREATININE 2.61* 2.74* 2.40* 3.54* 4.45* 5.35*  CALCIUM 8.7* 8.6* 8.5* 8.8* 8.8* 8.9  MG 2.6*  --  2.5* 2.7* 2.6* 2.8*  PHOS 2.9 2.7 3.5 4.3 5.0* 5.9*    Liver Function Tests:  Recent Labs Lab 01/24/16 1559 01/25/16 0422 01/26/16 0425 01/27/16 0515 01/28/16 0440  AST  --  45*  --   --   --   ALT  --  34  --   --   --   ALKPHOS  --   57  --   --   --   BILITOT  --  1.5*  --   --   --   PROT  --  6.7  --   --   --   ALBUMIN 3.3* 3.3*  3.2* 3.1* 3.3* 3.0*   No results for input(s): LIPASE, AMYLASE in the last 168 hours. No results for input(s): AMMONIA in the last 168 hours.  CBC:  Recent Labs Lab 01/22/16 0513 01/23/16 0429 01/24/16 0420 01/25/16 0422 01/27/16 0515  WBC 9.0 8.3 12.1* 7.8 11.2*  HGB 9.3* 9.2* 10.4* 9.5* 9.4*  HCT 29.3* 28.8* 31.6* 29.7* 29.0*  MCV 107.7* 106.7* 107.8* 107.2* 106.2*  PLT 73* 63* 67* 49* 47*    Cardiac Enzymes: No results for input(s): CKTOTAL, CKMB, CKMBINDEX, TROPONINI in the last 168 hours.  BNP: Invalid input(s): POCBNP  CBG:  Recent Labs Lab 01/27/16 0830 01/27/16 1111 01/27/16 1721 01/27/16 2113 01/28/16 0748  GLUCAP 166* 158* 157* 146* 141*    Microbiology: Results for orders placed or performed during the hospital encounter of 01/13/16  Surgical pcr screen     Status: None   Collection Time: 01/16/16 12:14 PM  Result Value Ref Range Status  MRSA, PCR NEGATIVE NEGATIVE Final   Staphylococcus aureus NEGATIVE NEGATIVE Final    Comment:        The Xpert SA Assay (FDA approved for NASAL specimens in patients over 72 years of age), is one component of a comprehensive surveillance program.  Test performance has been validated by [facility] for patients greater than or equal to 72 year old. It is not intended to diagnose infection nor to guide or monitor treatment.   Urine culture     Status: None   Collection Time: 01/17/16  4:01 PM  Result Value Ref Range Status   Specimen Description URINE, CATHETERIZED  Final   Special Requests NONE  Final   Culture NO GROWTH 2 DAYS  Final   Report Status 01/19/2016 FINAL  Final  Culture, blood (Routine X 2) w Reflex to ID Panel     Status: None   Collection Time: 01/17/16  5:50 PM  Result Value Ref Range Status   Specimen Description BLOOD RIGHT HAND  Final   Special Requests IN PEDIATRIC BOTTLE 1CC   Final   Culture NO GROWTH 5 DAYS  Final   Report Status 01/22/2016 FINAL  Final  Culture, blood (Routine X 2) w Reflex to ID Panel     Status: None   Collection Time: 01/17/16  6:01 PM  Result Value Ref Range Status   Specimen Description BLOOD LEFT HAND  Final   Special Requests IN PEDIATRIC BOTTLE 1CC  Final   Culture NO GROWTH 5 DAYS  Final   Report Status 01/22/2016 FINAL  Final    Coagulation Studies: No results for input(s): LABPROT, INR in the last 72 hours.  Urinalysis: No results for input(s): COLORURINE, LABSPEC, PHURINE, GLUCOSEU, HGBUR, BILIRUBINUR, KETONESUR, PROTEINUR, UROBILINOGEN, NITRITE, LEUKOCYTESUR in the last 72 hours.  Invalid input(s): APPERANCEUR    Imaging: No results found.   Medications:     . darbepoetin (ARANESP) injection - NON-DIALYSIS  100 mcg Subcutaneous Q Fri-1800  . hydrocortisone sod succinate (SOLU-CORTEF) inj  50 mg Intravenous Q12H  . insulin aspart  0-5 Units Subcutaneous QHS  . insulin aspart  0-9 Units Subcutaneous TID WC  . lactulose  20 g Oral BID  . pantoprazole sodium  40 mg Per Tube QHS  . sodium chloride flush  3 mL Intravenous Q12H   sodium chloride, ipratropium-albuterol, midodrine, naLOXone (NARCAN)  injection, ondansetron (ZOFRAN) IV, sodium chloride flush, sodium chloride flush  Assessment/ Plan:  1. Right heart failure and hypotension with pul hypertension.  2. Prior diagnosis of cirrhosis questionable etiology  3. Subacute renal failure currently off of CVVH.  Renal function continues to worsen.  Cr 5.35 today.  Na 133.  K stable. Very little urine output (~195cc/24 hours).  Patient declines HD. 4. Right heart failure unclear etiology. Unclear whether this is due to morbid obesity with obesity hypoventilation, subacute pulmonary emboli, or sleep apnea.  5. Thrombocytopenia, macrocytosis, mildly low albumin and evidence of cirrhosis of which could be markers of cirrhosis 6. Bladder outlet obstruction will  leave foley in for now and continue to follow  Patient with worsening renal function.  Very little UOP.  Not amenable to HD.  Discussed consideration of Palliative care consult with Dr Darnelle Catalanama this morning.  Will plan to sign off at this time.  Please do not hesitate to re-consult if we can be of any assistance.    LOS: 15 Clinton Wahlberg, DO 01/28/16,10:44 AM

## 2016-01-28 NOTE — Care Management Important Message (Signed)
Important Message  Patient Details  Name: Jewel BaizeWalter S Genao MRN: [id number] Date of Birth: 05/07/1946   Medicare Important Message Given:  Yes    Kyla BalzarineShealy, Jc Veron Abena 01/28/2016, 10:34 AM

## 2016-01-29 DIAGNOSIS — Z515 Encounter for palliative care: Secondary | ICD-10-CM | POA: Diagnosis present

## 2016-01-29 DIAGNOSIS — I10 Essential (primary) hypertension: Secondary | ICD-10-CM

## 2016-01-29 LAB — RENAL FUNCTION PANEL
ANION GAP: 10 (ref 5–15)
Albumin: 2.7 g/dL — ABNORMAL LOW (ref 3.5–5.0)
BUN: 50 mg/dL — AB (ref 6–20)
CHLORIDE: 100 mmol/L — AB (ref 101–111)
CO2: 25 mmol/L (ref 22–32)
Calcium: 8.4 mg/dL — ABNORMAL LOW (ref 8.9–10.3)
Creatinine, Ser: 4.79 mg/dL — ABNORMAL HIGH (ref 0.61–1.24)
GFR calc Af Amer: 13 mL/min — ABNORMAL LOW (ref >60)
GFR calc non Af Amer: 11 mL/min — ABNORMAL LOW (ref >60)
GLUCOSE: 148 mg/dL — AB (ref 65–99)
POTASSIUM: 3.7 mmol/L (ref 3.5–5.1)
Phosphorus: 5.2 mg/dL — ABNORMAL HIGH (ref 2.5–4.6)
Sodium: 135 mmol/L (ref 135–145)

## 2016-01-29 LAB — MAGNESIUM: Magnesium: 2.4 mg/dL (ref 1.7–2.4)

## 2016-01-29 LAB — GLUCOSE, CAPILLARY
GLUCOSE-CAPILLARY: 119 mg/dL — AB (ref 65–99)
Glucose-Capillary: 143 mg/dL — ABNORMAL HIGH (ref 65–99)
Glucose-Capillary: 148 mg/dL — ABNORMAL HIGH (ref 65–99)
Glucose-Capillary: 127 mg/dL — ABNORMAL HIGH (ref 65–99)

## 2016-01-29 NOTE — Progress Notes (Addendum)
Progress Note    OMIR COOPRIDER  BJY:[id number] DOB: Dec 15, 1945  DOA: 01/13/2016 PCP: Redmond Baseman, MD   Outpatient Specialists:   None.   Brief Narrative:   Maurice Paul is an 70 y.o. male admitted 01/13/16 with dyspnea and weight gain. Found to have subacute renal failure & CHF - ? In setting of liver disease, ? Decompensated cor pulmonale. He underwent placement of a PermCath on 01/16/16 (local + sedation). During dialysis (2 L removed) on 01/17/16 he also received narcotics for pain. ? If acute mental status changes are related to medications in the setting of poor renal/hepatic clearance. He later developed nausea and vomiting. Concern for possible aspiration but no overt infiltrate post vomiting on CXR.  Assessment/Plan:   Principal Problem:   Acute on chronic diastolic CHF (congestive heart failure) (HCC) with cardiogenic shock resulting in acute on chronic respiratory failure/pulmonary hypertension CHF suspected to be from decompensated cor pulmonale and possible hepatorenal syndrome. Required pressor support on admission with vasopressin, which was weaned on 01/27/16. No atrial shunt noted on 2-D echo with bubble study. EF 55-60 percent with no regional wall motion abnormality but evidence of right-sided heart failure.  Patient likely also has a component of OSA/OHS and will need an outpatient sleep study. Although sepsis was also in the differential, and the patient was thought to have possible septic shock on admission, no confirmed source of infection found and blood cultures, urine culture negative. He did complete a seven-day course of antibiotics. Continue oxygen support as needed to maintain saturations greater than 90%. Currently compensated. Weaning stress dose steroids.    Acute on chronic renal failure Possibly secondary to hepatorenal syndrome. Was on lisinopril and spironolactone prior to admission. Renal ultrasound negative for hydronephrosis and normal  echogenicity. Has been having CVVD. Discussion held with nephrology about long-term HD options. Initially, the patient declined, but a family meeting was held with the patient, his wife and his son and he is now amenable to HD. Had HD 01/28/16.  Active Problems:   Morbid obesity (HCC) Admission BMI was 48.5. Weight down 36 pounds from admission, likely due to diuresis and massive volume overload. Continue weight loss efforts.    Stasis dermatitis Continue to diurese.    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 There was a minimal amount of ascites present. Encephalopathy appears to have improved and likely was due to hepatic encephalopathy in the setting of being given sedating medications. Continue lactulose.    Diabetes mellitus, type 2 (HCC) Hemoglobin A1c 5.9%. CBGs 141-146. Continue insulin sensitive SSI.    Hypotension Continue midodrine.   Family Communication/Anticipated D/C date and plan/Code Status   DVT prophylaxis: Lovenox D/C'd secondary to low platelets.  SCDs ordered. Code Status: Full Code.  Family Communication: Wife updated at the bedside 01/28/16. Disposition Plan: Will need SNF for rehabilitation.   Medical Consultants:    Nephrology  Cardiology  Vascular Surgery  Physical Medicine and Rehabilitation  Pulmonology   Procedures:   Hemodialysis 4/07 LE Doppler >> no evidence of DVT bilaterally 4/07 ABD [place] >> normal amount of ascites, hepatic cirrhosis 4/07 Renal US >> small right renal cyst, otherwise kidneys unremarkable 4/08 ECHO >> EF 60-65%, normal wall motion, mild systolic flattening of ventricular septum c/w RV pressure overload, mild MR, LA moderately dilated, RV mildly dilated, RA mod to severely dilated, moderate TR, PA Peak  45 mm Hg 4/13 US Abd Doppler: Patent portal veins,  perihepatic ascites.  4/19 Echo with bubble study: PA pressure 55, no evidence of atrial shunt  Anti-Infectives:   Vanco 4/10 >> 4/12 Zosyn 4/10 >> 4/13 Ceftriaxone 4/13>>4/17  Subjective:   Maurice Paul continues to have episodes of nausea.  No dyspnea.  No BM today.  Appetite poor.  Objective:    Filed Vitals:   01/28/16 1800 01/28/16 1836 01/28/16 2225 01/29/16 0528  BP: 102/45 110/46 108/37 109/39  Pulse: 83 84 70 76  Temp:   97.5 F (36.4 C) 97.5 F (36.4 C)  TempSrc:   Oral Oral  Resp: 21 19 17 18   Height:      Weight:  137.4 kg (302 lb 14.6 oz)  135.9 kg (299 lb 9.7 oz)  SpO2: 100% 95% 99% 99%    Intake/Output Summary (Last 24 hours) at 01/29/16 0839 Last data filed at 01/29/16 0546  Gross per 24 hour  Intake    120 ml  Output   1100 ml  Net   -980 ml   Filed Weights   01/28/16 1530 01/28/16 1836 01/29/16 0528  Weight: 138.4 kg (305 lb 1.9 oz) 137.4 kg (302 lb 14.6 oz) 135.9 kg (299 lb 9.7 oz)    Exam: General exam: Appears calm and comfortable. Speech slow. Bloody urine in Foley bag. Respiratory system: Clear to auscultation. Respiratory effort normal. On 3 L of oxygen by nasal cannula. Cardiovascular system: S1 & S2 heard, RRR. No JVD, murmurs, rubs, gallops or clicks. Massive lower extremity edema. Gastrointestinal system: Abdomen is nondistended, soft and nontender. No organomegaly or masses felt. Normal bowel sounds heard. Central nervous system: Alert and oriented to self, place and month. No focal neurological deficits. Extremities: Symmetric 5 x 5 power. Massive lower extremity edema with venous stasis dermatitis. Skin: Venous stasis changes to his lower extremities, massive left arm ecchymosis. Psychiatry: Judgement and insight appear diminished. Mood & affect seems depressed.   Data Reviewed:   I have personally reviewed following labs and imaging studies:  Labs: Basic Metabolic Panel:  Recent Labs Lab 01/25/16 0422 01/26/16 0425  01/27/16 0515 01/28/16 0440 01/28/16 1556 01/29/16 0414  NA 136 135 134* 133* 135 135  K 4.0 4.1 3.8 4.1 3.9 3.7  CL 101 103 100* 100* 100* 100*  CO2 24 25 23 22 22 25   GLUCOSE 211* 201* 156* 145* 152* 148*  BUN 20 33* 47* 61* 68* 50*  CREATININE 2.40* 3.54* 4.45* 5.35* 5.68* 4.79*  CALCIUM 8.5* 8.8* 8.8* 8.9 8.8* 8.4*  MG 2.5* 2.7* 2.6* 2.8*  --  2.4  PHOS 3.5 4.3 5.0* 5.9* 5.5* 5.2*   GFR Estimated Creatinine Clearance: 19.6 mL/min (by C-G formula based on Cr of 4.79). Liver Function Tests:  Recent Labs Lab 01/25/16 0422 01/26/16 0425 01/27/16 0515 01/28/16 0440 01/28/16 1556 01/29/16 0414  AST 45*  --   --   --   --   --   ALT 34  --   --   --   --   --   ALKPHOS 57  --   --   --   --   --   BILITOT 1.5*  --   --   --   --   --   PROT 6.7  --   --   --   --   --   ALBUMIN 3.3*  3.2* 3.1* 3.3* 3.0* 3.2* 2.7*   CBC:  Recent Labs Lab 01/23/16  0429 01/24/16 0420 01/25/16 0422 01/27/16 0515  WBC 8.3 12.1* 7.8 11.2*  HGB 9.2* 10.4* 9.5* 9.4*  HCT 28.8* 31.6* 29.7* 29.0*  MCV 106.7* 107.8* 107.2* 106.2*  PLT 63* 67* 49* 47*   CBG:  Recent Labs Lab 01/27/16 2113 01/28/16 0748 01/28/16 1231 01/28/16 2227 01/29/16 0755  GLUCAP 146* 141* 137* 146* 143*   Urine analysis:    Component Value Date/Time   COLORURINE RED* 01/17/2016 1601   APPEARANCEUR TURBID* 01/17/2016 1601   LABSPEC 1.037* 01/17/2016 1601   PHURINE 6.5 01/17/2016 1601   GLUCOSEU NEGATIVE 01/17/2016 1601   HGBUR LARGE* 01/17/2016 1601   BILIRUBINUR MODERATE* 01/17/2016 1601   KETONESUR 15* 01/17/2016 1601   PROTEINUR >300* 01/17/2016 1601   UROBILINOGEN 1.0 01/02/2014 1017   NITRITE POSITIVE* 01/17/2016 1601   LEUKOCYTESUR MODERATE* 01/17/2016 1601   Microbiology No results found for this or any previous visit (from the past 240 hour(s)).  Radiology: No results found.  Medications:   . darbepoetin (ARANESP) injection - NON-DIALYSIS  100 mcg Subcutaneous Q Fri-1800  .  hydrocortisone sod succinate (SOLU-CORTEF) inj  50 mg Intravenous Daily  . insulin aspart  0-5 Units Subcutaneous QHS  . insulin aspart  0-9 Units Subcutaneous TID WC  . lactulose  20 g Oral BID  . sodium chloride flush  3 mL Intravenous Q12H   Continuous Infusions:   Time spent: 35 minutes.  The patient is medically complex with multiple co-morbidities and is at high risk for clinical deterioration and requires high complexity decision making.    LOS: 16 days   RAMA,CHRISTINA  [facility] Pager [phone]. If unable to reach me by pager, please call my cell phone at [phone].  *Please refer to [email or web address], password [email or web address] to get updated schedule on who will round on this patient, as hospitalists switch teams weekly. If 7PM-7AM, please contact night-coverage at [email or web address], password [email or web address] for any overnight needs.  01/29/2016, 8:39 AM

## 2016-01-29 NOTE — Consult Note (Signed)
Consultation Note Date: 01/29/2016   Patient Name: Maurice Paul  DOB: 01/13/1946  MRN: [id number]  Age / Sex: 70 y.o., male  PCP: Ileana LaddFrancis P Wong, MD Referring Physician: Maryruth Bunhristina P Rama, MD  Reason for Consultation: Establishing goals of care    Clinical Assessment/Narrative: Patient is a 70 year old man with past medical history of peripheral vascular disease, essential hypertension, chronic stasis dermatitis, type 2 diabetes, nonalcoholic cirrhosis, history of DVT. Patient was admitted from his primary care's office into the emergency room because of worsening weight gain with associated shortness of breath as well as abnormal labs reflecting worsening renal function. Patient now has a creatinine of 4.79. He had a normal serum creatinine with laboratory review in February of this year patient underwent hemodialysis on 410, became hypotensive as well as ABG reflected hypercarbia after receiving pain medication. He is quite ill and now suspicious for cardiorenal syndrome in the setting of liver disease. Patient just had a permacath placed for dialysis. Per patient's wife, both she and her husband understand how ill he is, and that he has a grim prognosis now with multisystem involvement. She states "I would like to have him around as long as possible",and is encouraging him to go to a skilled nursing facility for rehabilitation as well as to see if he can tolerate hemodialysis to buy him more time. I did speak to her in private about the that dialysis would not reverse his liver disease and that if this progresses he will become less and less responsive and unable to cooperate with outpatient hemodialysis. We did discuss CODE STATUS, the pathophysiology related to liver disease, now end-stage renal disease and diastolic heart failure. Wife reports that he is less lucid today than he was the other day. All of his children have  phoned in to see him  Contacts/Participants in Discussion: Primary Decision Maker: Bradd Canaryiane Chovan   Relationship to Patient wife HCPOA: yes  Patient is able to assist in some of his own healthcare decisions but I am concerned that he could become rapidly encephalopathic  SUMMARY OF RECOMMENDATIONS Continue full aggressive treatment for now Despite patient feeling unwell since 2015  and knowing that he has cirrhosis since that time, this acute decline has taken them all by surprise Patient reports she is willing to try dialysis to prolong his life. Patient's wife is able to articulate that she understands that he may not tolerate dialysis going forward and this may not necessarily extend his life I think she is waiting to see how he tolerates dialysis on Monday. I recommended that initial decision making focus on CODE STATUS. This was explained to her in depth. She states she would like to talk further with him about this before making any changes from full code I did give wife and patient booklets "hard choices for loving people", as well as MOST form as a talking format  Code Status/Advance Care Planning: Full code    Code Status Orders        Start     Ordered   01/13/16 2145  Full code   Continuous     01/13/16 2148    Code Status History    Date Active Date Inactive Code Status Order ID Comments User Context   01/08/2014  3:14 PM 01/22/2014  1:35 PM Full Code [id number]  Lynnae PrudeDaniel J Angiulli, PA-C Inpatient   12/24/2013  1:34 PM 01/08/2014  3:14 PM Full Code [id number]  Alba CoryBelkys A Regalado, MD Inpatient      Other  Directives:MOST Form  Symptom Management:   Pain: Patient is very fragile at this point we'll defer pain management to attending and nephrology. He is currently denying pain and there is no nonverbal signs and symptoms of unmanaged pain  Dyspnea: Continue with O2 via nasal cannula  Palliative Prophylaxis:   Aspiration, Bowel Regimen, Delirium Protocol, Frequent Pain  Assessment, Oral Care and Turn Reposition  Additional Recommendations (Limitations, Scope, Preferences):  Full Scope Treatment  Family is requesting information on autopsy. They're questioning whether his cirrhosis is an inherited condition. We'll defer to social work to assist family in how to proceed with autopsy  Psycho-social/Spiritual:  Support System: Strong Desire for further Chaplaincy support:no Additional Recommendations: Grief/Bereavement Support  Prognosis: Less than 6 months in the setting of hepatorenal, diastolic heart failure and at this point indeterminate ability to tolerate hemodialysis. I did share with his wife that he did qualify for hospice, and that if he were unable to tolerate hemodialysis he would qualify for inpatient hospice  Discharge Planning: Skilled Nursing Facility for rehab with Palliative care service follow-up at this point unless he cannot tolerate HD then family may shift to full comfort care   Chief Complaint/ Primary Diagnoses: Present on Admission:  . Acute on chronic diastolic CHF (congestive heart failure) (HCC) . Morbid obesity (HCC) . HTN (hypertension) . Stasis dermatitis . (Resolved) Cirrhosis of liver (HCC) . (Resolved) AKI (acute kidney injury) (HCC) . Elevated troponin . (Resolved) Cellulitis . Cardiogenic shock (HCC) . Acute on chronic respiratory failure (HCC) . Acute renal injury (HCC) . Cirrhosis of liver with ascites (HCC) . Pulmonary hypertension (HCC) . (Resolved) Acute respiratory failure (HCC) . (Resolved) Ascites . Hepatitis C antibody test positive  I have reviewed the medical record, interviewed the patient and family, and examined the patient. The following aspects are pertinent.  Past Medical History  Diagnosis Date  . PVD (peripheral vascular disease) (HCC)   . Tobacco chew use     for 30 years  . Hypertension   . SDH (subdural hematoma) (HCC) 03/2014    fall  . Stasis dermatitis   . Cirrhosis of liver  (HCC)   . Diabetes mellitus, type 2 (HCC)   . SAH (subarachnoid hemorrhage) (HCC) 12/24/2013  . Traumatic brain injury (HCC) 05/27/2014  . DVT (deep venous thrombosis) (HCC) 01/07/2014   Social History   Social History  . Marital Status: Married    Spouse Name: Graciella Belton  . Number of Children: 3  . Years of Education: 12+   Occupational History  . Retired     Social History Main Topics  . Smoking status: Former Smoker    Quit date: 10/09/1998  . Smokeless tobacco: Former Neurosurgeon    Types: Chew  . Alcohol Use: 0.0 oz/week    0 Standard drinks or equivalent per week     Comment: Occasional  . Drug Use: No  . Sexual Activity: Not Asked   Other Topics Concern  . None   Social History Narrative   Lives at home with wife.    Right handed.    Caffeine use:  Drinks 2L coke zero per day.       Family History  Problem Relation Age of Onset  . Stroke Father    Scheduled Meds: . darbepoetin (ARANESP) injection - NON-DIALYSIS  100 mcg Subcutaneous Q Fri-1800  . hydrocortisone sod succinate (SOLU-CORTEF) inj  50 mg Intravenous Daily  . insulin aspart  0-5 Units Subcutaneous QHS  . insulin aspart  0-9 Units Subcutaneous  TID WC  . lactulose  20 g Oral BID  . sodium chloride flush  3 mL Intravenous Q12H   Continuous Infusions:  PRN Meds:.sodium chloride, ipratropium-albuterol, midodrine, naLOXone (NARCAN)  injection, ondansetron (ZOFRAN) IV, sodium chloride flush, sodium chloride flush Medications Prior to Admission:  Prior to Admission medications   Medication Sig Start Date End Date Taking? Authorizing Provider  amLODipine (NORVASC) 2.5 MG tablet Take 2.5 mg by mouth daily.   Yes Historical Provider, MD  cephALEXin (KEFLEX) 500 MG capsule Take 500 mg by mouth 2 (two) times daily.   Yes Historical Provider, MD  furosemide (LASIX) 20 MG tablet Take 20 mg by mouth daily.   Yes Historical Provider, MD  lisinopril (PRINIVIL,ZESTRIL) 10 MG tablet Take 10 mg by mouth daily.   Yes Historical  Provider, MD  spironolactone (ALDACTONE) 100 MG tablet Take 100 mg by mouth daily.   Yes Historical Provider, MD  triamcinolone (KENALOG) 0.025 % cream  01/08/16  Yes Historical Provider, MD  ONGLYZA 2.5 MG TABS tablet  01/12/16   Historical Provider, MD   Allergies  Allergen Reactions  . Sulfa Antibiotics     Hives  . Onglyza [Saxagliptin] Rash    Review of Systems  Constitutional: Positive for activity change, fatigue and unexpected weight change.  HENT: Negative.   Eyes: Negative.   Respiratory: Positive for shortness of breath.   Cardiovascular: Negative.   Endocrine: Positive for cold intolerance.  Genitourinary: Negative.   Musculoskeletal: Positive for back pain.  Allergic/Immunologic: Negative.   Neurological: Positive for weakness.  Hematological: Bruises/bleeds easily.  Psychiatric/Behavioral: Negative.     Physical Exam  Nursing note and vitals reviewed. Constitutional:  Anasarca Obese older man , mildly confused, in no acute distress  Respiratory: Effort normal.  GI: He exhibits distension. There is tenderness.  Neurological: He is alert.  Oriented to where he is, generally what is medical condition is. Speech slow, measured, not slurred  Skin: Skin is warm and dry.  Psychiatric: He has a normal mood and affect.    Vital Signs: BP 104/35 mmHg  Pulse 78  Temp(Src) 97.7 F (36.5 C) (Oral)  Resp 16  Ht  (1.778 m)  Wt 135.9 kg (299 lb 9.7 oz)  BMI 42.99 kg/m2  SpO2 96%  SpO2: SpO2: 96 % O2 Device:SpO2: 96 % O2 Flow Rate: .O2 Flow Rate (L/min): 2.5 L/min  IO: Intake/output summary:  Intake/Output Summary (Last 24 hours) at 01/29/16 1500 Last data filed at 01/29/16 0546  Gross per 24 hour  Intake      0 ml  Output   1100 ml  Net  -1100 ml    LBM: Last BM Date: 01/25/16 Baseline Weight: Weight: (!) 154.404 kg (340 lb 6.4 oz) Most recent weight: Weight: 135.9 kg (299 lb 9.7 oz)      Palliative Assessment/Data:  Flowsheet Rows        Most  Recent Value   Intake Tab    Referral Department  Nephrology   Unit at Time of Referral  Med/Surg Unit   Palliative Care Primary Diagnosis  Nephrology   Date Notified  01/28/16   Palliative Care Type  New Palliative care   Reason for referral  Counsel Regarding Hospice   Date of Admission  01/13/16   Date first seen by Palliative Care  01/29/16   # of days Palliative referral response time  1 Day(s)   # of days IP prior to Palliative referral  15   Clinical Assessment  Palliative Performance Scale Score  30%   Pain Max last 24 hours  0   Pain Min Last 24 hours  0   Dyspnea Max Last 24 Hours  0   Dyspnea Min Last 24 hours  0   Nausea Max Last 24 Hours  3   Nausea Min Last 24 Hours  0   Anxiety Max Last 24 Hours  0   Anxiety Min Last 24 Hours  0   Other Max Last 24 Hours  0   Psychosocial & Spiritual Assessment    Palliative Care Outcomes       Additional Data Reviewed:  CBC:    Component Value Date/Time   WBC 11.2* 01/27/2016 0515   HGB 9.4* 01/27/2016 0515   HCT 29.0* 01/27/2016 0515   PLT 47* 01/27/2016 0515   MCV 106.2* 01/27/2016 0515   NEUTROABS 7.8* 01/20/2016 1546   LYMPHSABS 0.9 01/20/2016 1546   MONOABS 0.7 01/20/2016 1546   EOSABS 0.0 01/20/2016 1546   BASOSABS 0.0 01/20/2016 1546   Comprehensive Metabolic Panel:    Component Value Date/Time   NA 135 01/29/2016 0414   K 3.7 01/29/2016 0414   CL 100* 01/29/2016 0414   CO2 25 01/29/2016 0414   BUN 50* 01/29/2016 0414   CREATININE 4.79* 01/29/2016 0414   GLUCOSE 148* 01/29/2016 0414   CALCIUM 8.4* 01/29/2016 0414   AST 45* 01/25/2016 0422   ALT 34 01/25/2016 0422   ALKPHOS 57 01/25/2016 0422   BILITOT 1.5* 01/25/2016 0422   PROT 6.7 01/25/2016 0422   ALBUMIN 2.7* 01/29/2016 0414     Time In: 1400 Time Out: 1515 Time Total: 75 min Greater than 50%  of this time was spent counseling and coordinating care related to the above assessment and plan. Palliative Medicine Team to stay engaged to see  how he tolerates further HD  Signed by: Irean Hong, NP  Irean Hong, NP  01/29/2016, 3:00 PM  Please contact Palliative Medicine Team phone at [phone] for questions and concerns.

## 2016-01-29 NOTE — Progress Notes (Signed)
[facility] ROUNDING NOTE   Subjective:   Interval History:  CRRT stopped 4/19.  HD initiated 4/21  Notes that he had cramping in his extremity during HD yesterday.  Tried to eat this morning but became nauseated.  No other concerns at this time.  Objective:  Vital signs in last 24 hours:  Temp:  [97.5 F (36.4 C)-97.9 F (36.6 C)] 97.5 F (36.4 C) (04/22 0528) Pulse Rate:  [70-84] 76 (04/22 0528) Resp:  [17-23] 18 (04/22 0528) BP: (84-117)/(37-62) 109/39 mmHg (04/22 0528) SpO2:  [88 %-100 %] 99 % (04/22 0528) Weight:  [299 lb 9.7 oz (135.9 kg)-305 lb 1.9 oz (138.4 kg)] 299 lb 9.7 oz (135.9 kg) (04/22 0528)  Weight change: 2 lb 1.9 oz (0.96 kg) Filed Weights   01/28/16 1530 01/28/16 1836 01/29/16 0528  Weight: 305 lb 1.9 oz (138.4 kg) 302 lb 14.6 oz (137.4 kg) 299 lb 9.7 oz (135.9 kg)    Intake/Output: I/O last 3 completed shifts: In: 345 [P.O.:342; I.V.:3] Out: 1100 [Urine:100; Other:1000]   Intake/Output this shift:     GEN- awake but drowsy appearing.  A bit more alert this morning.  Speech still slow. CVS- RRR RS- Normal WOB on 2.5L [place].   ABD- +BS, present soft non-distended EXT- gross pitting LE edema, right forearm with bruising  Basic Metabolic Panel:  Recent Labs Lab 01/25/16 0422 01/26/16 0425 01/27/16 0515 01/28/16 0440 01/28/16 1556 01/29/16 0414  NA 136 135 134* 133* 135 135  K 4.0 4.1 3.8 4.1 3.9 3.7  CL 101 103 100* 100* 100* 100*  CO2 GLUCOSE 211* 201* 156* 145* 152* 148*  BUN 20 33* 47* 61* 68* 50*  CREATININE 2.40* 3.54* 4.45* 5.35* 5.68* 4.79*  CALCIUM 8.5* 8.8* 8.8* 8.9 8.8* 8.4*  MG 2.5* 2.7* 2.6* 2.8*  --  2.4  PHOS 3.5 4.3 5.0* 5.9* 5.5* 5.2*    Liver Function Tests:  Recent Labs Lab 01/25/16 0422 01/26/16 0425 01/27/16 0515 01/28/16 0440 01/28/16 1556 01/29/16 0414  AST 45*  --   --   --   --   --   ALT 34  --   --   --   --   --   ALKPHOS 57  --   --   --   --   --   BILITOT 1.5*  --    --   --   --   --   PROT 6.7  --   --   --   --   --   ALBUMIN 3.3*  3.2* 3.1* 3.3* 3.0* 3.2* 2.7*   No results for input(s): LIPASE, AMYLASE in the last 168 hours. No results for input(s): AMMONIA in the last 168 hours.  CBC:  Recent Labs Lab 01/23/16 0429 01/24/16 0420 01/25/16 0422 01/27/16 0515  WBC 8.3 12.1* 7.8 11.2*  HGB 9.2* 10.4* 9.5* 9.4*  HCT 28.8* 31.6* 29.7* 29.0*  MCV 106.7* 107.8* 107.2* 106.2*  PLT 63* 67* 49* 47*    Cardiac Enzymes: No results for input(s): CKTOTAL, CKMB, CKMBINDEX, TROPONINI in the last 168 hours.  BNP: Invalid input(s): POCBNP  CBG:  Recent Labs Lab 01/27/16 2113 01/28/16 0748 01/28/16 1231 01/28/16 2227 01/29/16 0755  GLUCAP 146* 141* 137* 146* 143*    Microbiology: Results for orders placed or performed during the hospital encounter of 01/13/16  Surgical pcr screen     Status: None   Collection Time: 01/16/16 12:14 PM  Result  Value Ref Range Status   MRSA, PCR NEGATIVE NEGATIVE Final   Staphylococcus aureus NEGATIVE NEGATIVE Final    Comment:        The Xpert SA Assay (FDA approved for NASAL specimens in patients over 72 years of age), is one component of a comprehensive surveillance program.  Test performance has been validated by [facility] for patients greater than or equal to 72 year old. It is not intended to diagnose infection nor to guide or monitor treatment.   Urine culture     Status: None   Collection Time: 01/17/16  4:01 PM  Result Value Ref Range Status   Specimen Description URINE, CATHETERIZED  Final   Special Requests NONE  Final   Culture NO GROWTH 2 DAYS  Final   Report Status 01/19/2016 FINAL  Final  Culture, blood (Routine X 2) w Reflex to ID Panel     Status: None   Collection Time: 01/17/16  5:50 PM  Result Value Ref Range Status   Specimen Description BLOOD RIGHT HAND  Final   Special Requests IN PEDIATRIC BOTTLE 1CC  Final   Culture NO GROWTH 5 DAYS  Final   Report Status  01/22/2016 FINAL  Final  Culture, blood (Routine X 2) w Reflex to ID Panel     Status: None   Collection Time: 01/17/16  6:01 PM  Result Value Ref Range Status   Specimen Description BLOOD LEFT HAND  Final   Special Requests IN PEDIATRIC BOTTLE 1CC  Final   Culture NO GROWTH 5 DAYS  Final   Report Status 01/22/2016 FINAL  Final    Coagulation Studies: No results for input(s): LABPROT, INR in the last 72 hours.  Urinalysis: No results for input(s): COLORURINE, LABSPEC, PHURINE, GLUCOSEU, HGBUR, BILIRUBINUR, KETONESUR, PROTEINUR, UROBILINOGEN, NITRITE, LEUKOCYTESUR in the last 72 hours.  Invalid input(s): APPERANCEUR    Imaging: No results found.   Medications:     . darbepoetin (ARANESP) injection - NON-DIALYSIS  100 mcg Subcutaneous Q Fri-1800  . hydrocortisone sod succinate (SOLU-CORTEF) inj  50 mg Intravenous Daily  . insulin aspart  0-5 Units Subcutaneous QHS  . insulin aspart  0-9 Units Subcutaneous TID WC  . lactulose  20 g Oral BID  . sodium chloride flush  3 mL Intravenous Q12H   sodium chloride, ipratropium-albuterol, midodrine, naLOXone (NARCAN)  injection, ondansetron (ZOFRAN) IV, sodium chloride flush, sodium chloride flush  Assessment/ Plan:  1. Right heart failure and hypotension with pul hypertension.  2. Prior diagnosis of cirrhosis questionable etiology  3. Subacute renal failure currently off of CVVH.  Cr 4.79 today.  Na 135.  K 3.7. Very little urine output (100cc/24 hours).  Will plan to hold off on HD for the weekend. 4. Right heart failure unclear etiology. Unclear whether this is due to morbid obesity with obesity hypoventilation, subacute pulmonary emboli, or sleep apnea.  5. Thrombocytopenia, macrocytosis, mildly low albumin and evidence of cirrhosis of which could be markers of cirrhosis 6. Bladder outlet obstruction    LOS: 16 Dhanvin Szeto, DO 01/29/16,11:10 AM

## 2016-01-30 ENCOUNTER — Encounter (HOSPITAL_COMMUNITY): Payer: Self-pay | Admitting: Internal Medicine

## 2016-01-30 DIAGNOSIS — D696 Thrombocytopenia, unspecified: Secondary | ICD-10-CM

## 2016-01-30 DIAGNOSIS — I472 Ventricular tachycardia: Secondary | ICD-10-CM

## 2016-01-30 DIAGNOSIS — B372 Candidiasis of skin and nail: Secondary | ICD-10-CM

## 2016-01-30 DIAGNOSIS — I4729 Other ventricular tachycardia: Secondary | ICD-10-CM

## 2016-01-30 HISTORY — DX: Other ventricular tachycardia: I47.29

## 2016-01-30 HISTORY — DX: Ventricular tachycardia: I47.2

## 2016-01-30 LAB — CBC
HEMATOCRIT: 25.9 % — AB (ref 39.0–52.0)
HEMOGLOBIN: 8.3 g/dL — AB (ref 13.0–17.0)
MCH: 34 pg (ref 26.0–34.0)
MCHC: 32 g/dL (ref 30.0–36.0)
MCV: 106.1 fL — AB (ref 78.0–100.0)
Platelets: 40 10*3/uL — ABNORMAL LOW (ref 150–400)
RBC: 2.44 MIL/uL — AB (ref 4.22–5.81)
RDW: 17.6 % — ABNORMAL HIGH (ref 11.5–15.5)
WBC: 13.9 10*3/uL — ABNORMAL HIGH (ref 4.0–10.5)

## 2016-01-30 LAB — GLUCOSE, CAPILLARY
GLUCOSE-CAPILLARY: 128 mg/dL — AB (ref 65–99)
GLUCOSE-CAPILLARY: 149 mg/dL — AB (ref 65–99)
GLUCOSE-CAPILLARY: 138 mg/dL — AB (ref 65–99)
Glucose-Capillary: 125 mg/dL — ABNORMAL HIGH (ref 65–99)

## 2016-01-30 LAB — RENAL FUNCTION PANEL
ALBUMIN: 2.7 g/dL — AB (ref 3.5–5.0)
ANION GAP: 12 (ref 5–15)
BUN: 64 mg/dL — AB (ref 6–20)
CO2: 24 mmol/L (ref 22–32)
Calcium: 8.4 mg/dL — ABNORMAL LOW (ref 8.9–10.3)
Chloride: 98 mmol/L — ABNORMAL LOW (ref 101–111)
Creatinine, Ser: 6.06 mg/dL — ABNORMAL HIGH (ref 0.61–1.24)
GFR calc Af Amer: 10 mL/min — ABNORMAL LOW (ref >60)
GFR calc non Af Amer: 8 mL/min — ABNORMAL LOW (ref >60)
GLUCOSE: 138 mg/dL — AB (ref 65–99)
PHOSPHORUS: 5.6 mg/dL — AB (ref 2.5–4.6)
POTASSIUM: 4 mmol/L (ref 3.5–5.1)
Sodium: 134 mmol/L — ABNORMAL LOW (ref 135–145)

## 2016-01-30 LAB — MAGNESIUM: Magnesium: 2.6 mg/dL — ABNORMAL HIGH (ref 1.7–2.4)

## 2016-01-30 MED ORDER — METOPROLOL SUCCINATE ER 25 MG PO TB24
12.5000 mg | ORAL_TABLET | Freq: Every day | ORAL | Status: DC
  Administered 2016-01-30 – 2016-01-31 (×2): 12.5 mg via ORAL
  Filled 2016-01-30 (×3): qty 1

## 2016-01-30 MED ORDER — DARBEPOETIN ALFA 100 MCG/0.5ML IJ SOSY
100.0000 ug | PREFILLED_SYRINGE | INTRAMUSCULAR | Status: DC
  Administered 2016-01-31: 100 ug via INTRAVENOUS
  Filled 2016-01-30: qty 0.5

## 2016-01-30 MED ORDER — NYSTATIN 100000 UNIT/GM EX POWD
Freq: Three times a day (TID) | CUTANEOUS | Status: DC
  Administered 2016-01-30 – 2016-02-02 (×8): via TOPICAL
  Filled 2016-01-30: qty 15

## 2016-01-30 NOTE — Progress Notes (Signed)
Telemetry notified nurse of 20 beat run of VTAC. Nurse assessed pt. Pt denies pain or complaints at this time. Dr Rama notified and ordered Metoprolol. Will administer as soon as available. Pt resting in bed at lowest position and call bell/phone is within reach. Will continue to monitor.

## 2016-01-30 NOTE — Progress Notes (Signed)
Progress Note    Maurice Paul  EAV:[id number] DOB: 28-Mar-1946  DOA: 01/13/2016 PCP: Redmond Baseman, MD   Outpatient Specialists:   None.   Brief Narrative:   Maurice Paul is an 70 y.o. male admitted 01/13/16 with dyspnea and weight gain. Found to have subacute renal failure & CHF - ? In setting of liver disease, ? Decompensated cor pulmonale. He underwent placement of a PermCath on 01/16/16 (local + sedation). During dialysis (2 L removed) on 01/17/16 he also received narcotics for pain. ? If acute mental status changes are related to medications in the setting of poor renal/hepatic clearance. He later developed nausea and vomiting. Concern for possible aspiration but no overt infiltrate post vomiting on CXR.  Assessment/Plan:   Principal Problem:   Acute on chronic diastolic CHF (congestive heart failure) (HCC) with cardiogenic shock resulting in acute on chronic respiratory failure/pulmonary hypertension CHF suspected to be from decompensated cor pulmonale and possible hepatorenal syndrome. Required pressor support on admission with vasopressin, which was weaned on 01/27/16. No atrial shunt noted on 2-D echo with bubble study. EF 55-60 percent with no regional wall motion abnormality but evidence of right-sided heart failure.  Patient likely also has a component of OSA/OHS and will need an outpatient sleep study. Although sepsis was also in the differential, and the patient was thought to have possible septic shock on admission, no confirmed source of infection found and blood cultures, urine culture negative. He did complete a seven-day course of antibiotics. Continue oxygen support as needed to maintain saturations greater than 90%. Currently compensated. Stress dose steroids discontinued 01/30/16.    Acute on chronic renal failure Possibly secondary to hepatorenal syndrome. Was on lisinopril and spironolactone prior to admission. Renal ultrasound negative for hydronephrosis and  normal echogenicity. Has been having CVVD. Discussion held with nephrology about long-term HD options. Initially, the patient declined, but a family meeting was held with the patient, his wife and his son and he is now amenable to HD. Had HD 01/28/16. HD planned for 01/31/16.  Active Problems:   Candidal skin infection Nystatin powder 3 times a day to affected area.     Nonsustained V. Tach Had a 20 beat run of nonsustained ventricular tachycardia today. Possibly related to electrolyte imbalances given renal failure. We'll start on low-dose metoprolol.    Morbid obesity (HCC) Admission BMI was 48.5. Weight down 36 pounds from admission, likely due to diuresis and massive volume overload. Continue weight loss efforts.    Stasis dermatitis Stable. Manage volume with HD.    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 There was a minimal amount of ascites present. Encephalopathy appears to have improved and likely was due to hepatic encephalopathy in the setting of being given sedating medications. Continue lactulose.    Diabetes mellitus, type 2 (HCC) Hemoglobin A1c 5.9%. CBGs 119-148. Continue insulin sensitive SSI.    Hypotension Continue midodrine.    Thrombocytopenia Likely secondary to cirrhosis. No evidence of bleeding. Monitor.    Family Communication/Anticipated D/C date and plan/Code Status   DVT prophylaxis: Lovenox D/C'd secondary to low platelets.  SCDs ordered. Code Status: Full Code.  Family Communication: Wife updated at the bedside 01/29/16. Disposition Plan: Will need SNF for rehabilitation.   Medical Consultants:    Nephrology  Cardiology  Vascular Surgery  Physical Medicine and Rehabilitation  Pulmonology   Procedures:  Hemodialysis 4/07 LE Doppler >> no evidence of DVT  bilaterally 4/07 ABD [place] >> normal amount of ascites, hepatic cirrhosis 4/07 Renal US >> small right renal cyst, otherwise kidneys unremarkable 4/08 ECHO >> EF 60-65%, normal wall motion, mild systolic flattening of ventricular septum c/w RV pressure overload, mild MR, LA moderately dilated, RV mildly dilated, RA mod to severely dilated, moderate TR, PA Peak 45 mm Hg 4/13 [place] Abd Doppler: Patent portal veins, perihepatic ascites.  4/19 Echo with bubble study: PA pressure 55, no evidence of atrial shunt  Anti-Infectives:   Vanco 4/10 >> 4/12 Zosyn 4/10 >> 4/13 Ceftriaxone 4/13>>4/17  Subjective:   Maurice Paul continues to have episodes of nausea and retching.  No dyspnea.  Appetite remains poor.   Objective:    Filed Vitals:   01/29/16 0528 01/29/16 1439 01/29/16 2157 01/30/16 0631  BP: 109/39 104/35 119/46 120/89  Pulse: 76 78 78 79  Temp: 97.5 F (36.4 C) 97.7 F (36.5 C) 97.5 F (36.4 C) 97.8 F (36.6 C)  TempSrc: Oral Oral Oral Oral  Resp: Height:      Weight: 135.9 kg (299 lb 9.7 oz)   134.6 kg (296 lb 11.8 oz)  SpO2: 99% 96% 97% 96%    Intake/Output Summary (Last 24 hours) at 01/30/16 0834 Last data filed at 01/30/16 0639  Gross per 24 hour  Intake      0 ml  Output     50 ml  Net    -50 ml   Filed Weights   01/28/16 1836 01/29/16 0528 01/30/16 0631  Weight: 137.4 kg (302 lb 14.6 oz) 135.9 kg (299 lb 9.7 oz) 134.6 kg (296 lb 11.8 oz)    Exam: General exam: Appears calm and comfortable. Speech remains slow. Bloody/dard cola colored urine in Foley bag. Respiratory system: Clear to auscultation. Respiratory effort normal. On 3 L of oxygen by nasal cannula. Cardiovascular system: S1 & S2 heard, RRR. No JVD, murmurs, rubs, gallops or clicks. Massive lower extremity edema. Gastrointestinal system: Abdomen is nondistended, soft and nontender. No organomegaly or masses felt. Normal bowel sounds heard. Central nervous system: Alert and oriented to  self, place and month. No focal neurological deficits. Extremities: Symmetric 5 x 5 power. Massive lower extremity edema with venous stasis dermatitis. Skin: Venous stasis changes to his lower extremities, massive left arm ecchymosis. Penile edema.  Candidal lesions in groin folds and intergluteal fold. Psychiatry: Judgement and insight appear diminished. Mood & affect seems depressed.   Data Reviewed:   I have personally reviewed following labs and imaging studies:  Labs: Basic Metabolic Panel:  Recent Labs Lab 01/26/16 0425 01/27/16 0515 01/28/16 0440 01/28/16 1556 01/29/16 0414 01/30/16 0440  NA 135 134* 133* 135 135 134*  K 4.1 3.8 4.1 3.9 3.7 4.0  CL 103 100* 100* 100* 100* 98*  CO2 GLUCOSE 201* 156* 145* 152* 148* 138*  BUN 33* 47* 61* 68* 50* 64*  CREATININE 3.54* 4.45* 5.35* 5.68* 4.79* 6.06*  CALCIUM 8.8* 8.8* 8.9 8.8* 8.4* 8.4*  MG 2.7* 2.6* 2.8*  --  2.4 2.6*  PHOS 4.3 5.0* 5.9* 5.5* 5.2* 5.6*   GFR Estimated Creatinine Clearance: 15.4 mL/min (by C-G formula based on Cr of 6.06). Liver Function Tests:  Recent Labs Lab 01/25/16 0422  01/27/16 0515 01/28/16 0440 01/28/16 1556 01/29/16 0414 01/30/16 0440  AST 45*  --   --   --   --   --   --  ALT 34  --   --   --   --   --   --   ALKPHOS 57  --   --   --   --   --   --   BILITOT 1.5*  --   --   --   --   --   --   PROT 6.7  --   --   --   --   --   --   ALBUMIN 3.3*  3.2*  < > 3.3* 3.0* 3.2* 2.7* 2.7*  < > = values in this interval not displayed. CBC:  Recent Labs Lab 01/24/16 0420 01/25/16 0422 01/27/16 0515 01/30/16 0440  WBC 12.1* 7.8 11.2* 13.9*  HGB 10.4* 9.5* 9.4* 8.3*  HCT 31.6* 29.7* 29.0* 25.9*  MCV 107.8* 107.2* 106.2* 106.1*  PLT 67* 49* 47* 40*   CBG:  Recent Labs Lab 01/28/16 2227 01/29/16 0755 01/29/16 1158 01/29/16 1655 01/29/16 2153  GLUCAP 146* 143* 148* 127* 119*   Urine analysis:    Component Value Date/Time   COLORURINE RED* 01/17/2016 1601    APPEARANCEUR TURBID* 01/17/2016 1601   LABSPEC 1.037* 01/17/2016 1601   PHURINE 6.5 01/17/2016 1601   GLUCOSEU NEGATIVE 01/17/2016 1601   HGBUR LARGE* 01/17/2016 1601   BILIRUBINUR MODERATE* 01/17/2016 1601   KETONESUR 15* 01/17/2016 1601   PROTEINUR >300* 01/17/2016 1601   UROBILINOGEN 1.0 01/02/2014 1017   NITRITE POSITIVE* 01/17/2016 1601   LEUKOCYTESUR MODERATE* 01/17/2016 1601   Microbiology No results found for this or any previous visit (from the past 240 hour(s)).  Radiology: No results found.  Medications:   . darbepoetin (ARANESP) injection - NON-DIALYSIS  100 mcg Subcutaneous Q Fri-1800  . hydrocortisone sod succinate (SOLU-CORTEF) inj  50 mg Intravenous Daily  . insulin aspart  0-5 Units Subcutaneous QHS  . insulin aspart  0-9 Units Subcutaneous TID WC  . lactulose  20 g Oral BID  . sodium chloride flush  3 mL Intravenous Q12H   Continuous Infusions:   Time spent: 35 minutes.  The patient is medically complex with multiple co-morbidities and is at high risk for clinical deterioration and requires high complexity decision making.    LOS: 17 days   Jondavid Schreier  [facility] Pager [phone]. If unable to reach me by pager, please call my cell phone at [phone].  *Please refer to [email or web address], password [email or web address] to get updated schedule on who will round on this patient, as hospitalists switch teams weekly. If 7PM-7AM, please contact night-coverage at [email or web address], password [email or web address] for any overnight needs.  01/30/2016, 8:34 AM

## 2016-01-30 NOTE — Progress Notes (Signed)
[facility] ROUNDING NOTE   Subjective:   Interval History:  CRRT stopped 4/19.  HD initiated 4/21  No issues today.  Objective:  Vital signs in last 24 hours:  Temp:  [97.5 F (36.4 C)-97.8 F (36.6 C)] 97.8 F (36.6 C) (04/23 0631) Pulse Rate:  [78-79] 79 (04/23 0631) Resp:  [16-18] 18 (04/23 0631) BP: (104-120)/(35-89) 120/89 mmHg (04/23 0631) SpO2:  [96 %-97 %] 96 % (04/23 0631) Weight:  [296 lb 11.8 oz (134.6 kg)] 296 lb 11.8 oz (134.6 kg) (04/23 0631)  Weight change: -8 lb 6 oz (-3.8 kg) Filed Weights   01/28/16 1836 01/29/16 0528 01/30/16 0631  Weight: 302 lb 14.6 oz (137.4 kg) 299 lb 9.7 oz (135.9 kg) 296 lb 11.8 oz (134.6 kg)    Intake/Output: I/O last 3 completed shifts: In: -  Out: 150 [Urine:150]   Intake/Output this shift:     GEN- awake but drowsy appearing.  A bit more alert this morning.  Speech still slow. CVS- RRR RS- Normal WOB ABD- +BS, present soft non-distended EXT- gross pitting LE edema, right forearm with bruising  Basic Metabolic Panel:  Recent Labs Lab 01/26/16 0425 01/27/16 0515 01/28/16 0440 01/28/16 1556 01/29/16 0414 01/30/16 0440  NA 135 134* 133* 135 135 134*  K 4.1 3.8 4.1 3.9 3.7 4.0  CL 103 100* 100* 100* 100* 98*  CO2 GLUCOSE 201* 156* 145* 152* 148* 138*  BUN 33* 47* 61* 68* 50* 64*  CREATININE 3.54* 4.45* 5.35* 5.68* 4.79* 6.06*  CALCIUM 8.8* 8.8* 8.9 8.8* 8.4* 8.4*  MG 2.7* 2.6* 2.8*  --  2.4 2.6*  PHOS 4.3 5.0* 5.9* 5.5* 5.2* 5.6*    Liver Function Tests:  Recent Labs Lab 01/25/16 0422  01/27/16 0515 01/28/16 0440 01/28/16 1556 01/29/16 0414 01/30/16 0440  AST 45*  --   --   --   --   --   --   ALT 34  --   --   --   --   --   --   ALKPHOS 57  --   --   --   --   --   --   BILITOT 1.5*  --   --   --   --   --   --   PROT 6.7  --   --   --   --   --   --   ALBUMIN 3.3*  3.2*  < > 3.3* 3.0* 3.2* 2.7* 2.7*  < > = values in this interval not displayed. No results for  input(s): LIPASE, AMYLASE in the last 168 hours. No results for input(s): AMMONIA in the last 168 hours.  CBC:  Recent Labs Lab 01/24/16 0420 01/25/16 0422 01/27/16 0515 01/30/16 0440  WBC 12.1* 7.8 11.2* 13.9*  HGB 10.4* 9.5* 9.4* 8.3*  HCT 31.6* 29.7* 29.0* 25.9*  MCV 107.8* 107.2* 106.2* 106.1*  PLT 67* 49* 47* 40*    Cardiac Enzymes: No results for input(s): CKTOTAL, CKMB, CKMBINDEX, TROPONINI in the last 168 hours.  BNP: Invalid input(s): POCBNP  CBG:  Recent Labs Lab 01/29/16 1158 01/29/16 1655 01/29/16 2153 01/30/16 0755 01/30/16 1142  GLUCAP 148* 127* 119* 128* 125*    Microbiology: Results for orders placed or performed during the hospital encounter of 01/13/16  Surgical pcr screen     Status: None   Collection Time: 01/16/16 12:14 PM  Result Value Ref Range Status   MRSA, PCR NEGATIVE  NEGATIVE Final   Staphylococcus aureus NEGATIVE NEGATIVE Final    Comment:        The Xpert SA Assay (FDA approved for NASAL specimens in patients over 72 years of age), is one component of a comprehensive surveillance program.  Test performance has been validated by [facility] for patients greater than or equal to 72 year old. It is not intended to diagnose infection nor to guide or monitor treatment.   Urine culture     Status: None   Collection Time: 01/17/16  4:01 PM  Result Value Ref Range Status   Specimen Description URINE, CATHETERIZED  Final   Special Requests NONE  Final   Culture NO GROWTH 2 DAYS  Final   Report Status 01/19/2016 FINAL  Final  Culture, blood (Routine X 2) w Reflex to ID Panel     Status: None   Collection Time: 01/17/16  5:50 PM  Result Value Ref Range Status   Specimen Description BLOOD RIGHT HAND  Final   Special Requests IN PEDIATRIC BOTTLE 1CC  Final   Culture NO GROWTH 5 DAYS  Final   Report Status 01/22/2016 FINAL  Final  Culture, blood (Routine X 2) w Reflex to ID Panel     Status: None   Collection Time: 01/17/16  6:01  PM  Result Value Ref Range Status   Specimen Description BLOOD LEFT HAND  Final   Special Requests IN PEDIATRIC BOTTLE 1CC  Final   Culture NO GROWTH 5 DAYS  Final   Report Status 01/22/2016 FINAL  Final    Coagulation Studies: No results for input(s): LABPROT, INR in the last 72 hours.  Urinalysis: No results for input(s): COLORURINE, LABSPEC, PHURINE, GLUCOSEU, HGBUR, BILIRUBINUR, KETONESUR, PROTEINUR, UROBILINOGEN, NITRITE, LEUKOCYTESUR in the last 72 hours.  Invalid input(s): APPERANCEUR    Imaging: No results found.   Medications:     . [START ON 01/31/2016] darbepoetin (ARANESP) injection - DIALYSIS  100 mcg Intravenous Q Mon-HD  . insulin aspart  0-5 Units Subcutaneous QHS  . insulin aspart  0-9 Units Subcutaneous TID WC  . lactulose  20 g Oral BID  . nystatin   Topical TID  . sodium chloride flush  3 mL Intravenous Q12H   sodium chloride, ipratropium-albuterol, midodrine, naLOXone (NARCAN)  injection, ondansetron (ZOFRAN) IV, sodium chloride flush, sodium chloride flush  Assessment/ Plan:  1. Right heart failure and hypotension with pul hypertension.  2. Prior diagnosis of cirrhosis questionable etiology  3. Suspect hepatorenal, Subacute renal failure:  currently getting HD.  Cr 6.06 today.  Na 134.  K 4.0. Very little urine output (50cc/24 hours). HD for tomorrow.  4. Right heart failure unclear etiology. Unclear whether this is due to morbid obesity with obesity hypoventilation, subacute pulmonary emboli, or sleep apnea. Per primary team. 5. Thrombocytopenia, macrocytosis, mildly low albumin and evidence of cirrhosis of which could be markers of cirrhosis 6. Bladder outlet obstruction  Long-term prognosis not favorable. Currently continuing aggressive management of issues. Palliative care has been consulted (thankfully).   Kathee DeltonIan D Leif Loflin, MD,MS,  PGY2 01/30/2016 12:38 PM

## 2016-01-31 DIAGNOSIS — E118 Type 2 diabetes mellitus with unspecified complications: Secondary | ICD-10-CM

## 2016-01-31 LAB — CBC
HCT: 26.3 % — ABNORMAL LOW (ref 39.0–52.0)
HEMOGLOBIN: 8.4 g/dL — AB (ref 13.0–17.0)
MCH: 33.9 pg (ref 26.0–34.0)
MCHC: 31.9 g/dL (ref 30.0–36.0)
MCV: 106 fL — ABNORMAL HIGH (ref 78.0–100.0)
Platelets: 45 10*3/uL — ABNORMAL LOW (ref 150–400)
RBC: 2.48 MIL/uL — AB (ref 4.22–5.81)
RDW: 17.6 % — ABNORMAL HIGH (ref 11.5–15.5)
WBC: 9.4 10*3/uL (ref 4.0–10.5)

## 2016-01-31 LAB — RENAL FUNCTION PANEL
ALBUMIN: 2.9 g/dL — AB (ref 3.5–5.0)
Anion gap: 11 (ref 5–15)
BUN: 78 mg/dL — AB (ref 6–20)
CHLORIDE: 99 mmol/L — AB (ref 101–111)
CO2: 25 mmol/L (ref 22–32)
CREATININE: 7.35 mg/dL — AB (ref 0.61–1.24)
Calcium: 8.4 mg/dL — ABNORMAL LOW (ref 8.9–10.3)
GFR calc Af Amer: 8 mL/min — ABNORMAL LOW (ref >60)
GFR, EST NON AFRICAN AMERICAN: 7 mL/min — AB (ref >60)
GLUCOSE: 126 mg/dL — AB (ref 65–99)
PHOSPHORUS: 6.4 mg/dL — AB (ref 2.5–4.6)
POTASSIUM: 4.3 mmol/L (ref 3.5–5.1)
Sodium: 135 mmol/L (ref 135–145)

## 2016-01-31 LAB — MAGNESIUM
MAGNESIUM: 2.6 mg/dL — AB (ref 1.7–2.4)
MAGNESIUM: 2.4 mg/dL (ref 1.7–2.4)

## 2016-01-31 LAB — BASIC METABOLIC PANEL
Anion gap: 14 (ref 5–15)
BUN: 56 mg/dL — AB (ref 6–20)
CALCIUM: 8.1 mg/dL — AB (ref 8.9–10.3)
CHLORIDE: 99 mmol/L — AB (ref 101–111)
CO2: 24 mmol/L (ref 22–32)
CREATININE: 6.18 mg/dL — AB (ref 0.61–1.24)
GFR, EST AFRICAN AMERICAN: 9 mL/min — AB (ref >60)
GFR, EST NON AFRICAN AMERICAN: 8 mL/min — AB (ref >60)
Glucose, Bld: 132 mg/dL — ABNORMAL HIGH (ref 65–99)
Potassium: 3.9 mmol/L (ref 3.5–5.1)
SODIUM: 137 mmol/L (ref 135–145)

## 2016-01-31 LAB — GLUCOSE, CAPILLARY
GLUCOSE-CAPILLARY: 110 mg/dL — AB (ref 65–99)
Glucose-Capillary: 121 mg/dL — ABNORMAL HIGH (ref 65–99)
Glucose-Capillary: 121 mg/dL — ABNORMAL HIGH (ref 65–99)
Glucose-Capillary: 171 mg/dL — ABNORMAL HIGH (ref 65–99)

## 2016-01-31 MED ORDER — MIDODRINE HCL 5 MG PO TABS
ORAL_TABLET | ORAL | Status: AC
  Administered 2016-01-31: 10 mg via ORAL
  Filled 2016-01-31: qty 10

## 2016-01-31 MED ORDER — ALTEPLASE 2 MG IJ SOLR: 2.0000 mg | Freq: Once | INTRAMUSCULAR | Status: DC | PRN

## 2016-01-31 MED ORDER — SODIUM CHLORIDE 0.9 % IV SOLN: 100.0000 mL | INTRAVENOUS | Status: DC | PRN

## 2016-01-31 MED ORDER — HEPARIN SODIUM (PORCINE) 1000 UNIT/ML DIALYSIS
1000.0000 [IU] | INTRAMUSCULAR | Status: DC | PRN
  Filled 2016-01-31: qty 1

## 2016-01-31 MED ORDER — CALCIUM CARBONATE ANTACID 500 MG PO CHEW
2.0000 | CHEWABLE_TABLET | Freq: Once | ORAL | Status: AC
Start: 2016-01-31 — End: 2016-01-31
  Administered 2016-01-31: 400 mg via ORAL
  Filled 2016-01-31: qty 2

## 2016-01-31 MED ORDER — LIDOCAINE HCL (PF) 1 % IJ SOLN
5.0000 mL | INTRAMUSCULAR | Status: DC | PRN
  Filled 2016-01-31: qty 5

## 2016-01-31 MED ORDER — LIDOCAINE-PRILOCAINE 2.5-2.5 % EX CREA: 1.0000 "application " | TOPICAL_CREAM | CUTANEOUS | Status: DC | PRN

## 2016-01-31 MED ORDER — SODIUM CHLORIDE 0.9 % IV BOLUS (SEPSIS)
250.0000 mL | Freq: Once | INTRAVENOUS | Status: AC
  Administered 2016-01-31: 250 mL via INTRAVENOUS

## 2016-01-31 MED ORDER — PENTAFLUOROPROP-TETRAFLUOROETH EX AERO: 1.0000 "application " | INHALATION_SPRAY | CUTANEOUS | Status: DC | PRN

## 2016-01-31 MED ORDER — DARBEPOETIN ALFA 100 MCG/0.5ML IJ SOSY
PREFILLED_SYRINGE | INTRAMUSCULAR | Status: AC
Start: 2016-01-31 — End: 2016-01-31
  Filled 2016-01-31: qty 0.5

## 2016-01-31 NOTE — Clinical Social Work Note (Signed)
Clinical Social Work Assessment  Patient Details  Name: Maurice Paul MRN: [id number] Date of Birth: 1946/06/04  Date of referral:  01/28/16               Reason for consult:  Facility Placement                Permission sought to share information with:  Facility Sport and exercise psychologist, Family Supports Permission granted to share information::  Yes, Verbal Permission Granted  Name::     [place]::  [facility] SN[facility]  Relationship::  Spouse  Contact Information:  [phone]  Housing/Transportation Living arrangements for the past 2 months:  [place] of Information:  Spouse Patient Interpreter Needed:  None Criminal Activity/Legal Involvement Pertinent to Current Situation/Hospitalization:  No - Comment as needed Significant Relationships:  Spouse Lives with:  Spouse Do you feel safe going back to the place where you live?  No Need for family participation in patient care:  Yes (Comment)  Care giving concerns:  CSW received referral for possible SNF placement at time of discharge. CSW met with patient and patient's wife at bedside regarding PT recommendation of SNF placement at time of discharge. Per patient's wife, patient's wife is currently unable to care for patient at their home given patient's current physical needs and fall risk. Patient and patient's wife expressed understanding of PT recommendation and are agreeable to SNF placement at time of discharge. CSW to continue to follow and assist with discharge planning needs.   Social Worker assessment / plan:  CSW spoke with patient and patient's wife concerning possibility of rehab at [facility] before returning home.  Employment status:  Retired Nurse, adult PT Recommendations:  [place] / Referral to community resources:  [place]  Patient/Family's Response to care:  Patient and patient's spouse recognize need for rehab before  returning home and are agreeable to a SNF in [place]. However, patient's wife also is considering Hospice services if patient is unable to tolerate dialysis. She is not able to lift him and he therefore cannot return home. She brought their dog to see patient today.  Patient/Family's Understanding of and Emotional Response to Diagnosis, Current Treatment, and Prognosis:  Patient is realistic regarding therapy needs. No questions/concerns about plan or treatment.    Emotional Assessment Appearance:  Appears stated age Attitude/Demeanor/Rapport:  Other (Appropriate) Affect (typically observed):  Quiet, Appropriate Orientation:  Oriented to Self, Oriented to Place, Oriented to  Time, Oriented to Situation Alcohol / Substance use:  Not Applicable Psych involvement (Current and /or in the community):  No (Comment)  Discharge Needs  Concerns to be addressed:  Care Coordination Readmission within the last 30 days:  No Current discharge risk:  None Barriers to Discharge:  Continued Medical Work up   [employer], [place] 01/31/2016, 5:01 PM

## 2016-01-31 NOTE — Progress Notes (Signed)
Dialysis treatment terminated early due to persistent hypotension.  1087 mL ultrafiltrated.  387 mL net fluid removal.  Patient status unchanged. Lung sounds diminished to ausculation in all fields. Generalized edema. Cardiac: NSR.  Cleansed LIJ catheter with chlorhexidine.  Disconnected lines and flushed ports with saline per protocol.  Ports locked with heparin and capped per protocol.    Report given to bedside, RN Lauren.  Pt dialyzed approximately 2.3 hours of ordered 3.5 hour treatment.  Tx terminated early per nephrologist.

## 2016-01-31 NOTE — Procedures (Signed)
Patient was seen on dialysis and the procedure was supervised. BFR 300 Via L IJ TDC BP is 91/34.  Patient does not appear to be tolerating treatment well with hypotension and hypoxia.  We will try sodium modeling, however given his massive volume overload and anasarca, we are unable to UF due to hypotension even with the use of midodrine.  I do not feel he will be able to tolerate ongoing hemodialysis and recommend transition to comfort care.

## 2016-01-31 NOTE — Progress Notes (Signed)
PT Cancellation Note  Patient Details Name: Maurice Paul MRN: [id number] DOB: 12/09/1945   Cancelled Treatment:    Reason Eval/Treat Not Completed: Patient at procedure or test/unavailable;Patient declined, no reason specified;Fatigue/lethargy limiting ability to participate Pt very lethargic, falling asleep during conversation. Declines working with PT today. Getting ready to go down to HD. Wife present in room. Will follow up.   Blake DivineShauna A Chena Chohan 01/31/2016, 12:42 PM Mylo RedShauna Ace Bergfeld, PT, DPT [phone]

## 2016-01-31 NOTE — Progress Notes (Signed)
Progress Note    Maurice Paul  EAV:[id number] DOB: [birth year]  DOA: 01/13/2016 PCP: Redmond Baseman, MD   Outpatient Specialists:   None.   Brief Narrative:   Maurice Paul is an 70 y.o. male admitted 01/13/16 with dyspnea and weight gain. Found to have subacute renal failure & CHF - ? In setting of liver disease, ? Decompensated cor pulmonale. He underwent placement of a PermCath on 01/16/16 (local + sedation). During dialysis (2 L removed) on 01/17/16 he also received narcotics for pain. ? If acute mental status changes are related to medications in the setting of poor renal/hepatic clearance. He later developed nausea and vomiting. Concern for possible aspiration but no overt infiltrate post vomiting on CXR.  Assessment/Plan:   Principal Problem:   Acute on chronic diastolic CHF (congestive heart failure) (HCC) with cardiogenic shock resulting in acute on chronic respiratory failure/pulmonary hypertension CHF suspected to be from decompensated cor pulmonale and possible hepatorenal syndrome. Required pressor support on admission with vasopressin, which was weaned on 01/27/16. No atrial shunt noted on 2-D echo with bubble study. EF 55-60 percent with no regional wall motion abnormality but evidence of right-sided heart failure.  Patient likely also has a component of OSA/OHS and will need an outpatient sleep study. Although sepsis was also in the differential, and the patient was thought to have possible septic shock on admission, no confirmed source of infection found and blood cultures, urine culture negative. He did complete a seven-day course of antibiotics. Continue oxygen support as needed to maintain saturations greater than 90%. Currently compensated. Stress dose steroids discontinued 01/30/16.    Acute on chronic renal failure Possibly secondary to hepatorenal syndrome. Was on lisinopril and spironolactone prior to admission. Renal ultrasound negative for hydronephrosis and  normal echogenicity. Initially treated with CVVD. Discussion held with nephrology about long-term HD options. Initially, the patient declined, but a family meeting was held with the patient, his wife and his son and he is now amenable to HD which continues to be administered per nephrology.  Active Problems:   Candidal skin infection Nystatin powder 3 times a day to affected area.     Nonsustained V. Tach Had a 20 beat run of nonsustained ventricular tachycardia today. Possibly related to electrolyte imbalances given renal failure. Continue low-dose metoprolol.    Morbid obesity (HCC) Admission BMI was 48.5. Weight down 36 pounds from admission, likely due to diuresis and massive volume overload. Continue weight loss efforts.    Stasis dermatitis Stable. Manage volume with HD.    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 There was a minimal amount of ascites present. Encephalopathy appears to have improved and likely was due to hepatic encephalopathy in the setting of being given sedating medications. Continue lactulose.    Diabetes mellitus, type 2 (HCC) Hemoglobin A1c 5.9%. CBGs 121-149. Continue insulin sensitive SSI.    Hypotension Continue midodrine.    Thrombocytopenia Likely secondary to cirrhosis. No evidence of bleeding. Monitor.    Family Communication/Anticipated D/C date and plan/Code Status   DVT prophylaxis: Lovenox D/C'd secondary to low platelets.  SCDs ordered. Code Status: Full Code.  Family Communication: Wife updated at the bedside 01/29/16. Disposition Plan: Will need SNF for rehabilitation.   Medical Consultants:    Nephrology  Cardiology  Vascular Surgery  Physical Medicine and Rehabilitation  Pulmonology   Procedures:   Hemodialysis 4/07  LE Doppler >> no evidence of DVT  bilaterally 4/07 ABD [place] >> normal amount of ascites, hepatic cirrhosis 4/07 Renal US >> small right renal cyst, otherwise kidneys unremarkable 4/08 ECHO >> EF 60-65%, normal wall motion, mild systolic flattening of ventricular septum c/w RV pressure overload, mild MR, LA moderately dilated, RV mildly dilated, RA mod to severely dilated, moderate TR, PA Peak 45 mm Hg 4/13 [place] Abd Doppler: Patent portal veins, perihepatic ascites.  4/19 Echo with bubble study: PA pressure 55, no evidence of atrial shunt  Anti-Infectives:   Vanco 4/10 >> 4/12 Zosyn 4/10 >> 4/13 Ceftriaxone 4/13>>4/17  Subjective:   Maurice Paul is very sleepy today, dozes off when I ask him questions.  Appetite remains poor. Continues to have nausea with retching spells. Denies dyspnea.  Objective:    Filed Vitals:   01/31/16 0028 01/31/16 0155 01/31/16 0451 01/31/16 0556  BP: 109/36 105/38 96/37 118/44  Pulse:  66 65 68  Temp:   97.7 F (36.5 C)   TempSrc:      Resp:   20   Height:      Weight:      SpO2:  98% 96%     Intake/Output Summary (Last 24 hours) at 01/31/16 0816 Last data filed at 01/30/16 2043  Gross per 24 hour  Intake    420 ml  Output      0 ml  Net    420 ml   Filed Weights   01/28/16 1836 01/29/16 0528 01/30/16 0631  Weight: 137.4 kg (302 lb 14.6 oz) 135.9 kg (299 lb 9.7 oz) 134.6 kg (296 lb 11.8 oz)    Exam: General exam: Sleepy today. Speech remains slow.  Respiratory system: Clear to auscultation. Respiratory effort normal. On 3 L of oxygen by nasal cannula. Cardiovascular system: S1 & S2 heard, RRR. No JVD, murmurs, rubs, gallops or clicks. Massive lower extremity edema. Gastrointestinal system: Abdomen is nondistended, soft and nontender. No organomegaly or masses felt. Normal bowel sounds heard. Central nervous system: Alert and oriented to self, place and month. No focal neurological deficits. Extremities: Symmetric 5 x 5 power. Massive lower extremity edema with venous  stasis dermatitis. Skin: Venous stasis changes to his lower extremities, massive left arm ecchymosis. Penile edema.  Candidal lesions in groin folds and intergluteal fold. Psychiatry: Judgement and insight appear diminished. Mood & affect seems depressed.   Data Reviewed:   I have personally reviewed following labs and imaging studies:  Labs: Basic Metabolic Panel:  Recent Labs Lab 01/27/16 0515 01/28/16 0440 01/28/16 1556 01/29/16 0414 01/30/16 0440 01/31/16 0619  NA 134* 133* 135 135 134* 135  K 3.8 4.1 3.9 3.7 4.0 4.3  CL 100* 100* 100* 100* 98* 99*  CO2 GLUCOSE 156* 145* 152* 148* 138* 126*  BUN 47* 61* 68* 50* 64* 78*  CREATININE 4.45* 5.35* 5.68* 4.79* 6.06* 7.35*  CALCIUM 8.8* 8.9 8.8* 8.4* 8.4* 8.4*  MG 2.6* 2.8*  --  2.4 2.6* 2.6*  PHOS 5.0* 5.9* 5.5* 5.2* 5.6* 6.4*   GFR Estimated Creatinine Clearance: 12.7 mL/min (by C-G formula based on Cr of 7.35). Liver Function Tests:  Recent Labs Lab 01/25/16 0422  01/28/16 0440 01/28/16 1556 01/29/16 0414 01/30/16 0440 01/31/16 0619  AST 45*  --   --   --   --   --   --   ALT 34  --   --   --   --   --   --  ALKPHOS 57  --   --   --   --   --   --   BILITOT 1.5*  --   --   --   --   --   --   PROT 6.7  --   --   --   --   --   --   ALBUMIN 3.3*  3.2*  < > 3.0* 3.2* 2.7* 2.7* 2.9*  < > = values in this interval not displayed. CBC:  Recent Labs Lab 01/25/16 0422 01/27/16 0515 01/30/16 0440  WBC 7.8 11.2* 13.9*  HGB 9.5* 9.4* 8.3*  HCT 29.7* 29.0* 25.9*  MCV 107.2* 106.2* 106.1*  PLT 49* 47* 40*   CBG:  Recent Labs Lab 01/30/16 0755 01/30/16 1142 01/30/16 1720 01/30/16 2031 01/31/16 0758  GLUCAP 128* 125* 149* 138* 121*   Urine analysis:    Component Value Date/Time   COLORURINE RED* 01/17/2016 1601   APPEARANCEUR TURBID* 01/17/2016 1601   LABSPEC 1.037* 01/17/2016 1601   PHURINE 6.5 01/17/2016 1601   GLUCOSEU NEGATIVE 01/17/2016 1601   HGBUR LARGE* 01/17/2016 1601    BILIRUBINUR MODERATE* 01/17/2016 1601   KETONESUR 15* 01/17/2016 1601   PROTEINUR >300* 01/17/2016 1601   UROBILINOGEN 1.0 01/02/2014 1017   NITRITE POSITIVE* 01/17/2016 1601   LEUKOCYTESUR MODERATE* 01/17/2016 1601   Microbiology No results found for this or any previous visit (from the past 240 hour(s)).  Radiology: No results found.  Medications:   . darbepoetin (ARANESP) injection - DIALYSIS  100 mcg Intravenous Q Mon-HD  . insulin aspart  0-5 Units Subcutaneous QHS  . insulin aspart  0-9 Units Subcutaneous TID WC  . lactulose  20 g Oral BID  . metoprolol succinate  12.5 mg Oral Daily  . nystatin   Topical TID  . sodium chloride flush  3 mL Intravenous Q12H   Continuous Infusions:   Time spent: 25 minutes    LOS: 18 days   RAMA,CHRISTINA  [facility] Pager [phone]. If unable to reach me by pager, please call my cell phone at [phone].  *Please refer to [email or web address], password [email or web address] to get updated schedule on who will round on this patient, as hospitalists switch teams weekly. If 7PM-7AM, please contact night-coverage at [email or web address], password [email or web address] for any overnight needs.  01/31/2016, 8:16 AM

## 2016-02-01 DIAGNOSIS — N186 End stage renal disease: Secondary | ICD-10-CM

## 2016-02-01 DIAGNOSIS — Z992 Dependence on renal dialysis: Secondary | ICD-10-CM

## 2016-02-01 DIAGNOSIS — Z66 Do not resuscitate: Secondary | ICD-10-CM

## 2016-02-01 LAB — RENAL FUNCTION PANEL
Albumin: 2.7 g/dL — ABNORMAL LOW (ref 3.5–5.0)
Anion gap: 11 (ref 5–15)
BUN: 63 mg/dL — ABNORMAL HIGH (ref 6–20)
CHLORIDE: 98 mmol/L — AB (ref 101–111)
CO2: 25 mmol/L (ref 22–32)
Calcium: 8.2 mg/dL — ABNORMAL LOW (ref 8.9–10.3)
Creatinine, Ser: 6.52 mg/dL — ABNORMAL HIGH (ref 0.61–1.24)
GFR, EST AFRICAN AMERICAN: 9 mL/min — AB (ref >60)
GFR, EST NON AFRICAN AMERICAN: 8 mL/min — AB (ref >60)
Glucose, Bld: 130 mg/dL — ABNORMAL HIGH (ref 65–99)
POTASSIUM: 4.1 mmol/L (ref 3.5–5.1)
Phosphorus: 6.3 mg/dL — ABNORMAL HIGH (ref 2.5–4.6)
Sodium: 134 mmol/L — ABNORMAL LOW (ref 135–145)

## 2016-02-01 LAB — GLUCOSE, CAPILLARY
GLUCOSE-CAPILLARY: 106 mg/dL — AB (ref 65–99)
Glucose-Capillary: 119 mg/dL — ABNORMAL HIGH (ref 65–99)
Glucose-Capillary: 131 mg/dL — ABNORMAL HIGH (ref 65–99)

## 2016-02-01 LAB — ANTI-SMOOTH MUSCLE ANTIBODY, IGG: F-ACTIN AB IGG: 19 U (ref 0–19)

## 2016-02-01 LAB — MAGNESIUM: MAGNESIUM: 2.5 mg/dL — AB (ref 1.7–2.4)

## 2016-02-01 LAB — PARATHYROID HORMONE, INTACT (NO CA): PTH: 93 pg/mL — ABNORMAL HIGH (ref 15–65)

## 2016-02-01 LAB — MITOCHONDRIAL ANTIBODIES: Mitochondrial M2 Ab, IgG: 3.7 Units (ref 0.0–20.0)

## 2016-02-01 LAB — ANTINUCLEAR ANTIBODIES, IFA: ANA Ab, IFA: NEGATIVE

## 2016-02-01 MED ORDER — ONDANSETRON HCL 4 MG/2ML IJ SOLN: 4.0000 mg | Freq: Four times a day (QID) | INTRAMUSCULAR | Status: AC | PRN

## 2016-02-01 MED ORDER — NYSTATIN 100000 UNIT/GM EX POWD: Freq: Three times a day (TID) | CUTANEOUS | Status: AC

## 2016-02-01 MED ORDER — IPRATROPIUM-ALBUTEROL 0.5-2.5 (3) MG/3ML IN SOLN: 3.0000 mL | RESPIRATORY_TRACT | Status: AC | PRN

## 2016-02-01 NOTE — Care Management Important Message (Signed)
Important Message  Patient Details  Name: Maurice Paul MRN: [id number] Date of Birth: 09/24/1946   Medicare Important Message Given:  Yes    Kyla BalzarineShealy, Royalty Fakhouri Abena 02/01/2016, 11:25 AM

## 2016-02-01 NOTE — Progress Notes (Signed)
OT Cancellation Note  Patient Details Name: Maurice Paul MRN: [id number] DOB: 04/16/1946   Cancelled Treatment:    Reason Eval/Treat Not Completed:  Pt with medical decline, family requesting comfort care. [facility]. Signing off.  Evern BioMayberry, Leonardo Plaia Lynn 02/01/2016, 2:11 PM

## 2016-02-01 NOTE — Progress Notes (Signed)
[facility] ROUNDING NOTE   Subjective:   Interval History:  CRRT stopped 4/19.  HD initiated 4/21.  Patient not tolerating HD secondary to hypotension. Systolics down to 70s.  Only about 380cc off w/ HD yesterday.   Altered mentation this am.  Wife notes that patient is unable to recognize her.  She note that patient is now comfort Paul and ask that he be referred [facility].  Objective:  Vital signs in last 24 hours:  Temp:  [97 F (36.1 C)-98 F (36.7 C)] 97.8 F (36.6 C) (04/25 0500) Pulse Rate:  [58-80] 58 (04/25 1049) Resp:  [16-24] 20 (04/25 0500) BP: (71-105)/(32-41) 93/38 mmHg (04/25 1049) SpO2:  [90 %-99 %] 92 % (04/25 1049) Weight:  [295 lb 6.7 oz (134 kg)-305 lb 5.4 oz (138.5 kg)] 295 lb 6.7 oz (134 kg) (04/25 0500)  Weight change:  Filed Weights   01/31/16 1215 01/31/16 1441 02/01/16 0500  Weight: 305 lb 5.4 oz (138.5 kg) 305 lb 5.4 oz (138.5 kg) 295 lb 6.7 oz (134 kg)    Intake/Output: I/O last 3 completed shifts: In: 1390 [P.O.:480; I.V.:910] Out: 387 [Other:387]   Intake/Output this shift:     GEN- awake, sitting up in bedside chair, smiling, drowsy appearing.  CVS- rate regular RS- Normal WOB EXT- gross pitting LE edema, right forearm with bruising  Basic Metabolic Panel:  Recent Labs Lab 01/28/16 1556 01/29/16 0414 01/30/16 0440 01/31/16 0619 01/31/16 1735 02/01/16 0415  NA 135 135 134* 135 137 134*  K 3.9 3.7 4.0 4.3 3.9 4.1  CL 100* 100* 98* 99* 99* 98*  CO2 22 25 24 25 24 25   GLUCOSE 152* 148* 138* 126* 132* 130*  BUN 68* 50* 64* 78* 56* 63*  CREATININE 5.68* 4.79* 6.06* 7.35* 6.18* 6.52*  CALCIUM 8.8* 8.4* 8.4* 8.4* 8.1* 8.2*  MG  --  2.4 2.6* 2.6* 2.4 2.5*  PHOS 5.5* 5.2* 5.6* 6.4*  --  6.3*    Liver Function Tests:  Recent Labs Lab 01/28/16 1556 01/29/16 0414 01/30/16 0440 01/31/16 0619 02/01/16 0415  ALBUMIN 3.2* 2.7* 2.7* 2.9* 2.7*   No results for input(s): LIPASE, AMYLASE in the last 168  hours. No results for input(s): AMMONIA in the last 168 hours.  CBC:  Recent Labs Lab 01/27/16 0515 01/30/16 0440 01/31/16 1241  WBC 11.2* 13.9* 9.4  HGB 9.4* 8.3* 8.4*  HCT 29.0* 25.9* 26.3*  MCV 106.2* 106.1* 106.0*  PLT 47* 40* 45*    Cardiac Enzymes: No results for input(s): CKTOTAL, CKMB, CKMBINDEX, TROPONINI in the last 168 hours.  BNP: Invalid input(s): POCBNP  CBG:  Recent Labs Lab 01/31/16 1154 01/31/16 1648 01/31/16 2141 02/01/16 0754 02/01/16 1217  GLUCAP 110* 121* 171* 106* 119*    Microbiology: Results for orders placed or performed during the hospital encounter of 01/13/16  Surgical pcr screen     Status: None   Collection Time: 01/16/16 12:14 PM  Result Value Ref Range Status   MRSA, PCR NEGATIVE NEGATIVE Final   Staphylococcus aureus NEGATIVE NEGATIVE Final    Comment:        The Xpert SA Assay (FDA approved for NASAL specimens in patients over 72 years of age), is one component of a comprehensive surveillance program.  Test performance has been validated by [facility] for patients greater than or equal to 952 year old. It is not intended to diagnose infection nor to guide or monitor treatment.   Urine culture     Status:  None   Collection Time: 01/17/16  4:01 PM  Result Value Ref Range Status   Specimen Description URINE, CATHETERIZED  Final   Special Requests NONE  Final   Culture NO GROWTH 2 DAYS  Final   Report Status 01/19/2016 FINAL  Final  Culture, blood (Routine X 2) w Reflex to ID Panel     Status: None   Collection Time: 01/17/16  5:50 PM  Result Value Ref Range Status   Specimen Description BLOOD RIGHT HAND  Final   Special Requests IN PEDIATRIC BOTTLE 1CC  Final   Culture NO GROWTH 5 DAYS  Final   Report Status 01/22/2016 FINAL  Final  Culture, blood (Routine X 2) w Reflex to ID Panel     Status: None   Collection Time: 01/17/16  6:01 PM  Result Value Ref Range Status   Specimen Description BLOOD LEFT HAND  Final    Special Requests IN PEDIATRIC BOTTLE 1CC  Final   Culture NO GROWTH 5 DAYS  Final   Report Status 01/22/2016 FINAL  Final    Coagulation Studies: No results for input(s): LABPROT, INR in the last 72 hours.  Urinalysis: No results for input(s): COLORURINE, LABSPEC, PHURINE, GLUCOSEU, HGBUR, BILIRUBINUR, KETONESUR, PROTEINUR, UROBILINOGEN, NITRITE, LEUKOCYTESUR in the last 72 hours.  Invalid input(s): APPERANCEUR    Imaging: No results found.   Medications:     . nystatin   Topical TID  . sodium chloride flush  3 mL Intravenous Q12H   sodium chloride, ipratropium-albuterol, ondansetron (ZOFRAN) IV, sodium chloride flush, sodium chloride flush  Assessment/ Plan:  1. Right heart failure and hypotension with pul hypertension.  2. Prior diagnosis of cirrhosis questionable etiology  3. Suspect hepatorenal, Subacute renal failure: Cr 6.52 .  Na 134.  K 4.1. No UOP.  Not tolerating HD secondary to hypotension.  Will discontinue HD. 4. Right heart failure unclear etiology. Unclear whether this is due to morbid obesity with obesity hypoventilation, subacute pulmonary emboli, or sleep apnea. Per primary team. 5. Thrombocytopenia, macrocytosis, mildly low albumin and evidence of cirrhosis of which could be markers of cirrhosis 6. Bladder outlet obstruction  Patient has been made comfort Paul.  Agree with family's decision, as patient continues to decline and he is unable to tolerate HD.  Palliative discussion to be held at 12pm today.  Family wishes for patient to be referred to [facility]    Will sign off.  Maurice M. Maurice Counts, DO PGY-2, [facility] 02/01/2016 12:51 PM  I have seen and examined this patient and agree with plan as outlined by Maurice Paul.  Maurice Paul was not able to tolerate HD at all yesterday with marked drop in BP despite small amount of volume removed and was miserable afterwards with c/o cramping.  I discussed this with his wife and son.  All  are in agreement to proceed with transition to comfort measures and they have a meeting with Maurice Paul to assist with transfer to [facility] place.  Will sign off, please call with any questions or concerns. Maurice Paul A Rhema Boyett,MD 02/01/2016 1:27 PM

## 2016-02-01 NOTE — Progress Notes (Addendum)
Daily Progress Note   Patient Name: Maurice Paul       Date: 02/01/2016 DOB: 1945-11-18  Age: 70 y.o. MRN#: [id number] Attending Physician: Maryruth Bun Rama, MD Primary Care Physician: Redmond Baseman, MD Admit Date: 01/13/2016  Reason for Consultation/Follow-up: Establishing goals of care, Hospice Evaluation, Non pain symptom management, Pain control and Psychosocial/spiritual support  Subjective:   This NP Lorinda Creed reviewed medical records, received report from team, assessed the patient and then meet at the patient's bedside along with his wife and son  to discuss diagnosis, prognosis, GOC, EOL wishes disposition and options.  A detailed discussion was had today regarding advanced directives.  Concepts specific to code status, artifical feeding and hydration, continued IV antibiotics and rehospitalization was had.  The difference between a aggressive medical intervention path  and a palliative comfort care path for this patient at this time was had.  Values and goals of care important to patient and family were attempted to be elicited.  Concept of Hospice and Palliative Care were discussed  Natural trajectory and expectations at EOL were discussed.  Questions and concerns addressed.  Hard Choices booklet left for review. Family encouraged to call with questions or concerns.  PMT will continue to support holistically.   MOST form reviewed   Length of Stay: 19 days  Current Medications: Scheduled Meds:  . nystatin   Topical TID  . sodium chloride flush  3 mL Intravenous Q12H    Continuous Infusions:    PRN Meds: sodium chloride, ipratropium-albuterol, ondansetron (ZOFRAN) IV, sodium chloride flush, sodium chloride flush  Physical Exam  Constitutional: He appears  well-developed. He appears lethargic. He appears ill.  Cardiovascular: Normal rate, regular rhythm and normal heart sounds.   Pulmonary/Chest: He has decreased breath sounds in the right lower field and the left lower field.  Abdominal: Soft. Bowel sounds are decreased.  Neurological: He appears lethargic.  Skin: Skin is warm and dry.                Vital Signs: BP 94/37 mmHg  Pulse 57  Temp(Src) 97.7 F (36.5 C) (Oral)  Resp 20  Ht  (1.778 m)  Wt 134 kg (295 lb 6.7 oz)  BMI 42.39 kg/m2  SpO2 93% SpO2: SpO2: 93 % O2 Device: O2 Device: Not Delivered O2 Flow Rate:  O2 Flow Rate (L/min): 1 L/min  Intake/output summary:  Intake/Output Summary (Last 24 hours) at 02/01/16 2100 Last data filed at 02/01/16 1839  Gross per 24 hour  Intake     90 ml  Output      0 ml  Net     90 ml   LBM: Last BM Date: 01/27/16 Baseline Weight: Weight: (!) 154.404 kg (340 lb 6.4 oz) Most recent weight: Weight: 134 kg (295 lb 6.7 oz)       Palliative Assessment/Data:      20 %    Flowsheet Rows        Most Recent Value   Intake Tab    Referral Department  Nephrology   Unit at Time of Referral  Med/Surg Unit   Palliative Care Primary Diagnosis  Nephrology   Date Notified  01/28/16   Palliative Care Type  New Palliative care   Reason for referral  Counsel Regarding Hospice   Date of Admission  01/13/16   Date first seen by Palliative Care  01/29/16   # of days Palliative referral response time  1 Day(s)   # of days IP prior to Palliative referral  15   Clinical Assessment    Palliative Performance Scale Score  30%   Pain Max last 24 hours  0   Pain Min Last 24 hours  0   Dyspnea Max Last 24 Hours  0   Dyspnea Min Last 24 hours  0   Nausea Max Last 24 Hours  3   Nausea Min Last 24 Hours  0   Anxiety Max Last 24 Hours  0   Anxiety Min Last 24 Hours  0   Other Max Last 24 Hours  0   Psychosocial & Spiritual Assessment    Palliative Care Outcomes       Patient Active Problem  List   Diagnosis Date Noted  . Thrombocytopenia (HCC) 01/30/2016  . Nonsustained ventricular tachycardia (HCC) 01/30/2016  . Candidal skin infection 01/30/2016  . Palliative care encounter   . Hepatitis C antibody test positive 01/25/2016  . Acute on chronic respiratory failure (HCC)   . Acute renal injury (HCC)   . Cirrhosis of liver with ascites (HCC)   . Pulmonary hypertension (HCC)   . Cardiogenic shock (HCC)   . Elevated troponin 01/14/2016  . Acute on chronic diastolic CHF (congestive heart failure) (HCC) 01/13/2016  . Stasis dermatitis   . Diabetes mellitus, type 2 (HCC)   . HTN (hypertension) 12/30/2013  . Acute respiratory failure with hypoxia and hypercarbia (HCC) 12/25/2013  . Metabolic encephalopathy 12/25/2013  . Morbid obesity (HCC) 12/25/2013    Palliative Care Assessment & Plan    Family has made decision to shift to full comfort path.  Hope is for comfort, quality and dignity.  Request referral for hospice facility.    Goals of Care and Additional Recommendations:  Limitations on Scope of Treatment: Full Comfort Care, no further dialysis  Code Status:   DNR      Code Status Orders        Start     Ordered   02/01/16 1232  Do not attempt resuscitation (DNR)   Continuous    Question Answer Comment  In the event of cardiac or respiratory ARREST Do not call a "code blue"   In the event of cardiac or respiratory ARREST Do not perform Intubation, CPR, defibrillation or ACLS   In the event of cardiac or respiratory ARREST Use medication  by any route, position, wound care, and other measures to relive pain and suffering. May use oxygen, suction and manual treatment of airway obstruction as needed for comfort.      02/01/16 1233    Code Status History    Date Active Date Inactive Code Status Order ID Comments User Context   01/13/2016  9:48 PM 02/01/2016 12:33 PM Full Code [id number]  Rolly SalterPranav M Patel, MD ED   01/08/2014  3:14 PM 01/22/2014  1:35 PM Full Code  [id number]  Charlton Amoraniel J Angiulli, PA-C Inpatient   12/24/2013  1:34 PM 01/08/2014  3:14 PM Full Code [id number]  Alba CoryBelkys A Regalado, MD Inpatient       Prognosis:    -hours to days    Discharge Planning:  Hopeful for hospice facility  Care plan was discussed with Dr Darnelle Catalanama and Forrestine HimEva Davis SW  Thank you for allowing the Palliative Medicine Team to assist in the care of this patient.   Time In: 1200 Time Out: 1245 Total Time 45 min Prolonged Time Billed  no      Greater than 50%  of this time was spent counseling and coordinating care related to the above assessment and plan.  Lorinda CreedLARACH, MARY, NP  Please contact Palliative Medicine Team phone at [phone] for questions and concerns.

## 2016-02-01 NOTE — Progress Notes (Addendum)
Update: Patient able to discharge to [facility] Wednesday.    CS[facility] assessment of appropriateness of residential hospice placement.  Osborne Cascoadia Niasha Devins LCSWA [phone]

## 2016-02-01 NOTE — Progress Notes (Signed)
Nutrition Brief Note  Chart reviewed. Pt now transitioning to comfort care.  No further nutrition interventions warranted at this time.  Please re-consult as needed.   Ellanor Feuerstein A. Maylen Waltermire, RD, LDN, CDE Pager: [phone] After hours Pager: [phone]  

## 2016-02-01 NOTE — Progress Notes (Signed)
[place] Liaison:   Received request from [place] for family interest in [facility]. Chart reviewed. [employer] available tomorrow. Met with spouse at [facility] to complete paper work for transfer tomorrow.  ? Please fax discharge summary to [phone]. ? RN please call report to [phone]. ? Please arrange transport for as early in day as possible.  ? Thank you, Freddi Starr RN, [facility] Liaison [phone]

## 2016-02-01 NOTE — Progress Notes (Signed)
PT Cancellation Note/ Discharge  Patient Details Name: Maurice Paul MRN: [id number] DOB: 04/24/1946   Cancelled Treatment:    Reason Eval/Treat Not Completed:  (pt transitioned to comfort care.) Pt to be d/c'd to [facility] Wednesday. Pt with no further acute PT needs at this time. PT SIGNING OFF. Please re-consult if needed in future.   [name], Leyda Vanderwerf Marie 02/01/2016, 2:12 PM   Lewis ShockAshly Hayli Milligan, PT, DPT Pager #: [phone] Office #: [phone]

## 2016-02-01 NOTE — Discharge Summary (Addendum)
Physician Discharge Summary  Maurice BaizeWalter S Golomb [id number] DOB: 09/13/1946 DOA: 01/13/2016  PCP: Redmond BasemanWONG,FRANCIS PATRICK, MD  Admit date: 01/13/2016 Discharge date: 02/02/2016   Recommendations for Outpatient Follow-Up:   1. The patient is being discharged to [facility] for end [facility] life [facility]. 2. Family requested further work up of etiology of cirrhosis, primarily so that offspring would know their own risk factors.  Please F/U ceruloplasmin, hemochromatosis, ANA, mitochondrial antibodies and anti-smooth muscle antibodies and communicate results to family.  Discharge Diagnosis:   Principal Problem:    Cardiogenic shock/Acute on chronic diastolic CHF (congestive heart failure) (HCC) in the setting of acute renal failure Active Problems:    Morbid obesity (HCC)    HTN (hypertension)    Stasis dermatitis    Diabetes mellitus, type 2 (HCC)    Elevated troponin    Cardiogenic shock (HCC)    Acute on chronic respiratory failure (HCC)    Acute renal injury (HCC)    Cirrhosis of liver with ascites (HCC)    Pulmonary hypertension (HCC)    Hepatitis C antibody test positive    Palliative care encounter    Thrombocytopenia (HCC)    Nonsustained ventricular tachycardia (HCC)    Candidal skin infection  Discharge disposition:  [facility] facility.  Discharge Condition: Terminal.  Diet recommendation: Low sodium, heart healthy.  Comfort feeds.  History of Present Illness:   Maurice Paul is an 70 y.o. male admitted 01/13/16 with dyspnea and weight gain. Found to have subacute renal failure & CHF - ? In setting of liver disease, ? Decompensated cor pulmonale. He underwent placement of a PermCath on 01/16/16 (local + sedation). During dialysis (2 L removed) on 01/17/16 he also received narcotics for pain. ? If acute mental status changes are related to medications in the setting of poor renal/hepatic clearance. He later developed nausea and vomiting. Concern  for possible aspiration but no overt infiltrate post vomiting on CXR.  Hospital Course by Problem:   Principal Problem:  Acute on chronic diastolic CHF (congestive heart failure) (HCC) with cardiogenic shock resulting in acute on chronic respiratory failure/pulmonary hypertension in the setting of acute renal failure CHF suspected to be from decompensated cor pulmonale and possible hepatorenal syndrome. Required pressor support on admission with vasopressin, which was weaned on 01/27/16. No atrial shunt noted on 2-D echo with bubble study. EF 55-60 percent with no regional wall motion abnormality but evidence of right-sided heart failure. Patient likely also has a component of OSA/OHS. Although sepsis was also in the differential, and the patient was thought to have possible septic shock on admission, no confirmed source of infection found and blood cultures, urine culture negative. He did complete a seven-day course of antibiotics. Continue oxygen support as needed to maintain saturations greater than 90%. Currently compensated. Stress dose steroids discontinued 01/30/16.   Acute on chronic renal failure Possibly secondary to hepatorenal syndrome. Was on lisinopril and spironolactone prior to admission. Renal ultrasound negative for hydronephrosis and normal echogenicity. Initially treated with CVVD. Discussion held with nephrology about long-term HD options. Initially, the patient declined, but a family meeting was held with the patient, his wife and his son and he is now amenable to HD which continues to be administered per nephrology.  Active Problems:  Candidal skin infection Nystatin powder 3 times a day to affected area.    Nonsustained V. Tach Had a 20 beat run of nonsustained ventricular tachycardia 01/31/16. Possibly related to electrolyte imbalances given renal failure.  Morbid obesity (HCC) Admission BMI was 48.5. Weight down 36 pounds from admission, likely due to diuresis and  massive volume overload.    Stasis dermatitis Stable. Manage volume with HD.   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 There was a minimal amount of ascites present. Encephalopathy appears to have improved and likely was due to hepatic encephalopathy in the setting of being given sedating medications. Continue lactulose. ? Etiology.  Hepatitis C was negative.  Other serologies ordered, primarily to provide family to information who may be impacted by heritable conditions.  The results of these diagnostic tests will need to be communicated to the family when available.   Diabetes mellitus, type 2 (HCC) Hemoglobin A1c 5.9%. CBGs 121-149. Continue insulin sensitive SSI.   Hypotension Continue midodrine.   Thrombocytopenia Likely secondary to cirrhosis. No evidence of bleeding. Monitor.   Medical Consultants:    Nephrology  Cardiology  Vascular Surgery  Physical Medicine and Rehabilitation  Pulmonology   Discharge Exam:   Filed Vitals:   02/01/16 0500 02/01/16 1049  BP: 105/34 93/38  Pulse: 61 58  Temp: 97.8 F (36.6 C)   Resp: 20    Filed Vitals:   01/31/16 1528 01/31/16 2102 02/01/16 0500 02/01/16 1049  BP: 95/40 96/32 105/34 93/38  Pulse: 72 63 61 58  Temp: 98 F (36.7 C) 97.5 F (36.4 C) 97.8 F (36.6 C)   TempSrc: Oral Oral Oral   Resp: Height:      Weight:   134 kg (295 lb 6.7 oz)   SpO2: 91% 99% 97% 92%   General exam: Somnolent. Respiratory system: Diminished. Respiratory effort normal. On 3 L of oxygen by nasal cannula. Cardiovascular system: S1 & S2 heard, RRR. No JVD, murmurs, rubs, gallops or clicks. Massive lower extremity edema. Gastrointestinal system: Abdomen is nondistended, soft and nontender. No organomegaly or masses felt. Normal bowel sounds heard. Central  nervous system: Somnolent. No focal neurological deficits. Extremities: Massive lower extremity edema with venous stasis dermatitis. Skin: Venous stasis changes to his lower extremities, massive left arm ecchymosis. Penile edema. Candidal lesions in groin folds and intergluteal fold. Psychiatry: Judgement and insight appear diminished. Mood & affect seems depressed.   The results of significant diagnostics from this hospitalization (including imaging, microbiology, ancillary and laboratory) are listed below for reference.     Procedures and Diagnostic Studies:    Hemodialysis 4/07 LE Doppler >> no evidence of DVT bilaterally 4/07 ABD [place] >> normal amount of ascites, hepatic cirrhosis 4/07 Renal US >> small right renal cyst, otherwise kidneys unremarkable 4/08 ECHO >> EF 60-65%, normal wall motion, mild systolic flattening of ventricular septum c/w RV pressure overload, mild MR, LA moderately dilated, RV mildly dilated, RA mod to severely dilated, moderate TR, PA Peak 45 mm Hg 4/13 [place] Abd Doppler: Patent portal veins, perihepatic ascites.  4/19 Echo with bubble study: PA pressure 55, no evidence of atrial shunt Dg Chest 2 View  01/13/2016  CLINICAL DATA:  Shortness of Breath EXAM: CHEST  2 VIEW COMPARISON:  01/08/2014 FINDINGS: Borderline cardiomegaly. Central mild vascular congestion without convincing pulmonary edema. There is small bilateral pleural effusion with bilateral basilar atelectasis or infiltrate. IMPRESSION: Central vascular congestion without convincing pulmonary edema. Small bilateral pleural effusion with bilateral basilar atelectasis or infiltrate. Electronically Signed   By: Natasha Mead M.D.   On: 01/13/2016 18:53  US Renal  01/14/2016  CLINICAL DATA:  Chronic renal disease EXAM: RENAL / URINARY TRACT ULTRASOUND COMPLETE COMPARISON:  CT abdomen and pelvis December 01, 2014 FINDINGS: Right Kidney: Length: 13.9 cm. Echogenicity and renal cortical thickness are within normal  limits. No perinephric fluid or hydronephrosis visualized. There is a cyst arising from the lower pole of the right kidney measuring 1.8 x 1.9 x 1.9 cm. No sonographically demonstrable calculus or ureterectasis. Left Kidney: Length: 12.8 cm. Echogenicity and renal cortical thickness are within normal limits. No mass, perinephric fluid, or hydronephrosis visualized. No sonographically demonstrable calculus or ureterectasis. Bladder: Appears normal for degree of bladder distention. Incidental note is made of a small splenule adjacent to the spleen. A small amount of ascites is noted near the liver edge. IMPRESSION: Small right renal cyst.  Kidneys otherwise appear unremarkable. Small amount of ascites near the liver edge. Small accessory spleen noted incidentally. Electronically Signed   By: Bretta Bang III M.D.   On: 01/14/2016 10:37   US Abdomen Limited  01/14/2016  CLINICAL DATA:  Hepatic cirrhosis with abdominal distention EXAM: LIMITED ABDOMEN ULTRASOUND FOR ASCITES TECHNIQUE: Limited ultrasound survey for ascites was performed in all four abdominal quadrants. COMPARISON:  CT abdomen and pelvis December 01, 2014 FINDINGS: There is rather minimal ascites adjacent to the liver. Peristalsing bowel is seen elsewhere. The contour of the liver is nodular with increased liver echogenicity consistent with underlying hepatic cirrhosis. IMPRESSION: Rather minimal amount of ascites.  Hepatic cirrhosis. Electronically Signed   By: Bretta Bang III M.D.   On: 01/14/2016 10:31     Labs:   Basic Metabolic Panel:  Recent Labs Lab 01/28/16 1556 01/29/16 0414 01/30/16 0440 01/31/16 0619 01/31/16 1735 02/01/16 0415  NA 135 135 134* 135 137 134*  K 3.9 3.7 4.0 4.3 3.9 4.1  CL 100* 100* 98* 99* 99* 98*  CO2 22 25 24 25 24 25   GLUCOSE 152* 148* 138* 126* 132* 130*  BUN 68* 50* 64* 78* 56* 63*  CREATININE 5.68* 4.79* 6.06* 7.35* 6.18* 6.52*  CALCIUM 8.8* 8.4* 8.4* 8.4* 8.1* 8.2*  MG  --  2.4 2.6*  2.6* 2.4 2.5*  PHOS 5.5* 5.2* 5.6* 6.4*  --  6.3*   GFR Estimated Creatinine Clearance: 14.3 mL/min (by C-G formula based on Cr of 6.52). Liver Function Tests:  Recent Labs Lab 01/28/16 1556 01/29/16 0414 01/30/16 0440 01/31/16 0619 02/01/16 0415  ALBUMIN 3.2* 2.7* 2.7* 2.9* 2.7*   CBC:  Recent Labs Lab 01/27/16 0515 01/30/16 0440 01/31/16 1241  WBC 11.2* 13.9* 9.4  HGB 9.4* 8.3* 8.4*  HCT 29.0* 25.9* 26.3*  MCV 106.2* 106.1* 106.0*  PLT 47* 40* 45*   CBG:  Recent Labs Lab 01/31/16 1154 01/31/16 1648 01/31/16 2141 02/01/16 0754 02/01/16 1217  GLUCAP 110* 121* 171* 106* 119*     Discharge Instructions:   Discharge Instructions    Activity as tolerated - No restrictions    Complete by:  As directed      Call MD for:  persistant nausea and vomiting    Complete by:  As directed      Call MD for:  severe uncontrolled pain    Complete by:  As directed      Diet - low sodium heart healthy    Complete by:  As directed             Medication List    STOP taking these medications        amLODipine 2.5  MG tablet  Commonly known as:  NORVASC     cephALEXin 500 MG capsule  Commonly known as:  KEFLEX     furosemide 20 MG tablet  Commonly known as:  LASIX     lisinopril 10 MG tablet  Commonly known as:  PRINIVIL,ZESTRIL     ONGLYZA 2.5 MG Tabs tablet  Generic drug:  saxagliptin HCl     spironolactone 100 MG tablet  Commonly known as:  ALDACTONE     triamcinolone 0.025 % cream  Commonly known as:  KENALOG      TAKE these medications        ipratropium-albuterol 0.5-2.5 (3) MG/3ML Soln  Commonly known as:  DUONEB  Take 3 mLs by nebulization every 4 (four) hours as needed.     nystatin powder  Commonly known as:  nystatin  Apply topically 3 (three) times daily.     ondansetron 4 MG/2ML Soln injection  Commonly known as:  ZOFRAN  Inject 2 mLs (4 mg total) into the vein every 6 (six) hours as needed for nausea.           Follow-up  Information    Follow up with Redmond Baseman, MD.   Specialty:  Family Medicine   Contact information:   [address] [place] [address] [phone]       Schedule an appointment as soon as possible for a visit with Redmond Baseman, MD.   Specialty:  Family Medicine   Why:  As needed   Contact information:   [address] [place] [address] [phone]        Time coordinating discharge: 35 minutes.  Signed:  RAMA,CHRISTINA  Pager [phone] [facility] 02/01/2016, 2:09 PM

## 2016-02-01 NOTE — Discharge Instructions (Signed)
Acute Kidney Injury °Acute kidney injury is any condition in which there is sudden (acute) damage to the kidneys. Acute kidney injury was previously known as acute kidney failure or acute renal failure. The kidneys are two organs that lie on either side of the spine between the middle of the back and the front of the abdomen. The kidneys: °· Remove wastes and extra water from the blood.   °· Produce important hormones. These help keep bones strong, regulate blood pressure, and help create red blood cells.   °· Balance the fluids and chemicals in the blood and tissues. °A small amount of kidney damage may not cause problems, but a large amount of damage may make it difficult or impossible for the kidneys to work the way they should. Acute kidney injury may develop into long-lasting (chronic) kidney disease. It may also develop into a life-threatening disease called end-stage kidney disease. Acute kidney injury can get worse very quickly, so it should be treated right away. Early treatment may prevent other kidney diseases from developing. °CAUSES  °· A problem with blood flow to the kidneys. This may be caused by:   °¨ Blood loss.   °¨ Heart disease.   °¨ Severe burns.   °¨ Liver disease. °· Direct damage to the kidneys. This may be caused by: °¨ Some medicines.   °¨ A kidney infection.   °¨ Poisoning or consuming toxic substances.   °¨ A surgical wound.   °¨ A blow to the kidney area.   °· A problem with urine flow. This may be caused by:   °¨ Cancer.   °¨ Kidney stones.   °¨ An enlarged prostate. °SIGNS AND SYMPTOMS  °· Swelling (edema) of the legs, ankles, or feet.   °· Tiredness (lethargy).   °· Nausea or vomiting.   °· Confusion.   °· Problems with urination, such as:   °¨ Painful or burning feeling during urination.   °¨ Decreased urine production.   °¨ Frequent accidents in children who are potty trained.   °¨ Bloody urine.   °· Muscle twitches and cramps.   °· Shortness of breath.   °· Seizures.   °· Chest  pain or pressure. °Sometimes, no symptoms are present.  °DIAGNOSIS °Acute kidney injury may be detected and diagnosed by tests, including blood, urine, imaging, or kidney biopsy tests.  °TREATMENT °Treatment of acute kidney injury varies depending on the cause and severity of the kidney damage. In mild cases, no treatment may be needed. The kidneys may heal on their own. If acute kidney injury is more severe, your health care provider will treat the cause of the kidney damage, help the kidneys heal, and prevent complications from occurring. Severe cases may require a procedure to remove toxic wastes from the body (dialysis) or surgery to repair kidney damage. Surgery may involve:  °· Repair of a torn kidney.   °· Removal of an obstruction. °HOME CARE INSTRUCTIONS °· Follow your prescribed diet. °· Take medicines only as directed by your health care provider.  °· Do not take any new medicines (prescription, over-the-counter, or nutritional supplements) unless approved by your health care provider. Many medicines can worsen your kidney damage or may need to have the dose adjusted.   °· Keep all follow-up visits as directed by your health care provider. This is important. °· Observe your condition to make sure you are healing as expected. °SEEK IMMEDIATE MEDICAL CARE IF: °· You are feeling ill or have severe pain in the back or side.   °· Your symptoms return or you have new symptoms. °· You have any symptoms of end-stage kidney disease. These include:   °¨ Persistent itchiness.   °¨   Loss of appetite.   °¨ Headaches.   °¨ Abnormally dark or light skin. °¨ Numbness in the hands or feet.   °¨ Easy bruising.   °¨ Frequent hiccups.   °¨ Menstruation stops.   °· You have a fever. °· You have increased urine production. °· You have pain or bleeding when urinating. °MAKE SURE YOU:  °· Understand these instructions. °· Will watch your condition. °· Will get help right away if you are not doing well or get worse. °  °This  information is not intended to replace advice given to you by your health care provider. Make sure you discuss any questions you have with your health care provider. °  °Document Released: 04/10/2011 Document Revised: 10/16/2014 Document Reviewed: 05/24/2012 °Elsevier Interactive Patient Education ©2016 [employer] Inc[employer] ° °

## 2016-02-02 DIAGNOSIS — Z992 Dependence on renal dialysis: Secondary | ICD-10-CM

## 2016-02-02 DIAGNOSIS — N186 End stage renal disease: Secondary | ICD-10-CM | POA: Insufficient documentation

## 2016-02-02 DIAGNOSIS — Z66 Do not resuscitate: Secondary | ICD-10-CM | POA: Insufficient documentation

## 2016-02-02 LAB — GLUCOSE, CAPILLARY: GLUCOSE-CAPILLARY: 112 mg/dL — AB (ref 65–99)

## 2016-02-02 LAB — CERULOPLASMIN: CERULOPLASMIN: 17.7 mg/dL (ref 16.0–31.0)

## 2016-02-02 MED ORDER — MORPHINE SULFATE (PF) 2 MG/ML IV SOLN
2.0000 mg | INTRAVENOUS | Status: DC | PRN
  Administered 2016-02-02: 2 mg via INTRAVENOUS
  Filled 2016-02-02: qty 1

## 2016-02-02 MED ORDER — HEPARIN SOD (PORK) LOCK FLUSH 10 UNIT/ML IV SOLN
10.0000 [IU] | INTRAVENOUS | Status: AC | PRN
  Administered 2016-02-02: 10 [IU]

## 2016-02-02 MED ORDER — MORPHINE SULFATE (PF) 2 MG/ML IV SOLN: 2.0000 mg | INTRAVENOUS | Status: AC | PRN

## 2016-02-02 NOTE — Progress Notes (Signed)
Patient will DC to: [facility] Anticipated DC date: 02/02/16 Family notified: Wife Transport by: Sharin MonsPTAR   Per MD patient ready for [facility]. RN, patient, patient's family, and facility notified of DC. RN given number for report. DC packet on chart. Ambulance transport requested for patient.   CSW signing off.  Cristobal GoldmannNadia Rhett Najera, [place]CSWA Clinical Social Worker [phone]

## 2016-02-02 NOTE — Care Management Note (Signed)
Case Management Note  Patient Details  Name: Maurice Paul MRN: [id number] Date of Birth: 03/26/1946  Subjective/Objective:          Admitted 01/13/16 with dyspnea and weight gain. Found to have subacute renal failure & CHF - ? In setting of liver disease, ? Decompensated cor pulmonale.          Action/Plan: Plan to d/c today to [facility]. No further needs identified per NCM.  Expected Discharge Date:    02/02/2016           Expected Discharge Plan:  Residential Hospice  In-House Referral:     Discharge planning Services  CM Consult    Status of Service:  Completed, signed off    Epifanio LeschesCole, [name], [place]RN 02/02/2016, 12:34 PM

## 2016-02-07 DEATH — deceased

## 2016-02-08 LAB — HEMOCHROMATOSIS DNA-PCR(C282Y,H63D)

## 2016-05-05 IMAGING — DX DG CHEST 1V PORT
1 series · 1 of 1 positions shown · non-contrast
Comparison: 01/16/2016

CLINICAL DATA: Shortness of breath.  Altered mental status.

EXAM:
PORTABLE CHEST 1 VIEW

[chest ap]
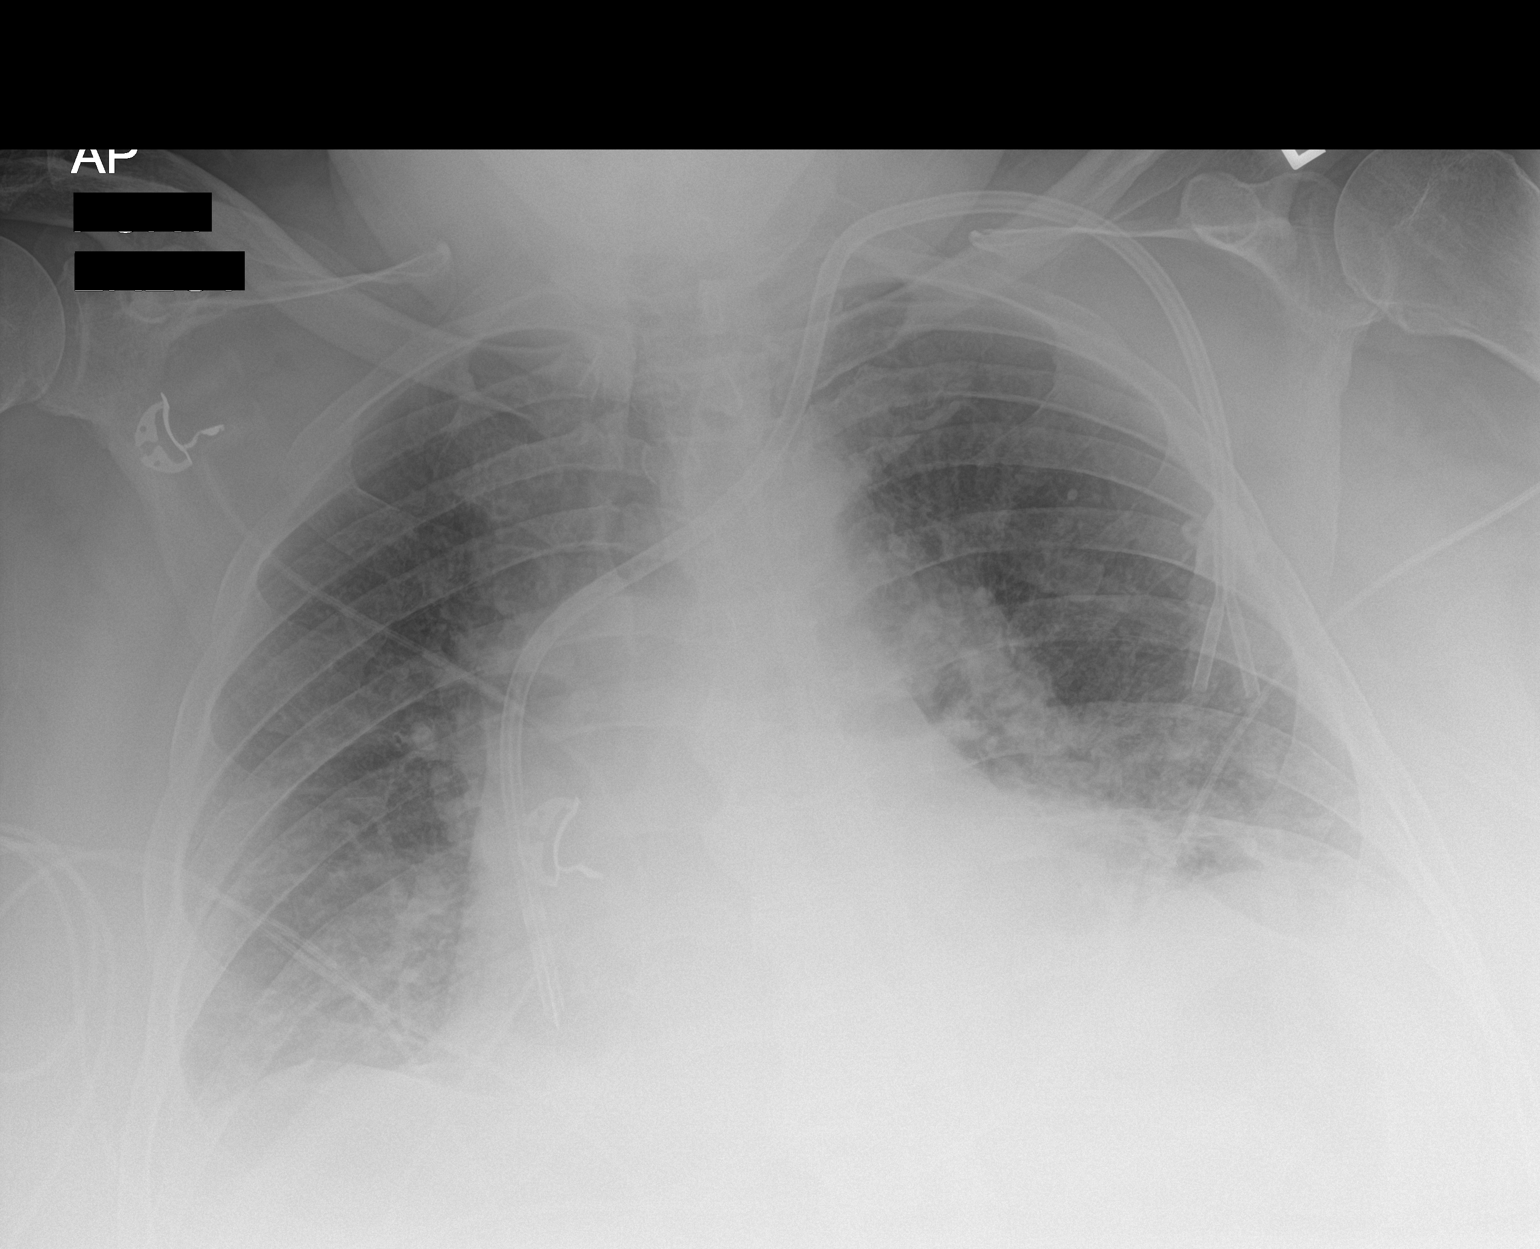

[1 of 1 positions shown; findings below may reference images not displayed]

FINDINGS: Left jugular dialysis catheter terminates over the right atrium,
unchanged. The cardiac silhouette remains enlarged. Lung volumes are
diminished, slightly more so than on the prior study. There is
pulmonary vascular congestion with mild left greater than right
basilar opacity. No sizable pleural effusion or pneumothorax is
identified.
IMPRESSION: Cardiomegaly and hypoinflation with pulmonary vascular congestion
and mild bibasilar opacities which may reflect atelectasis and/or
mild edema.

## 2016-05-08 IMAGING — CR DG CHEST 1V PORT
1 series · 1 of 1 positions shown · non-contrast
Comparison: 01/17/2016.

CLINICAL DATA: Respiratory failure.

EXAM:
PORTABLE CHEST 1 VIEW

[AP]
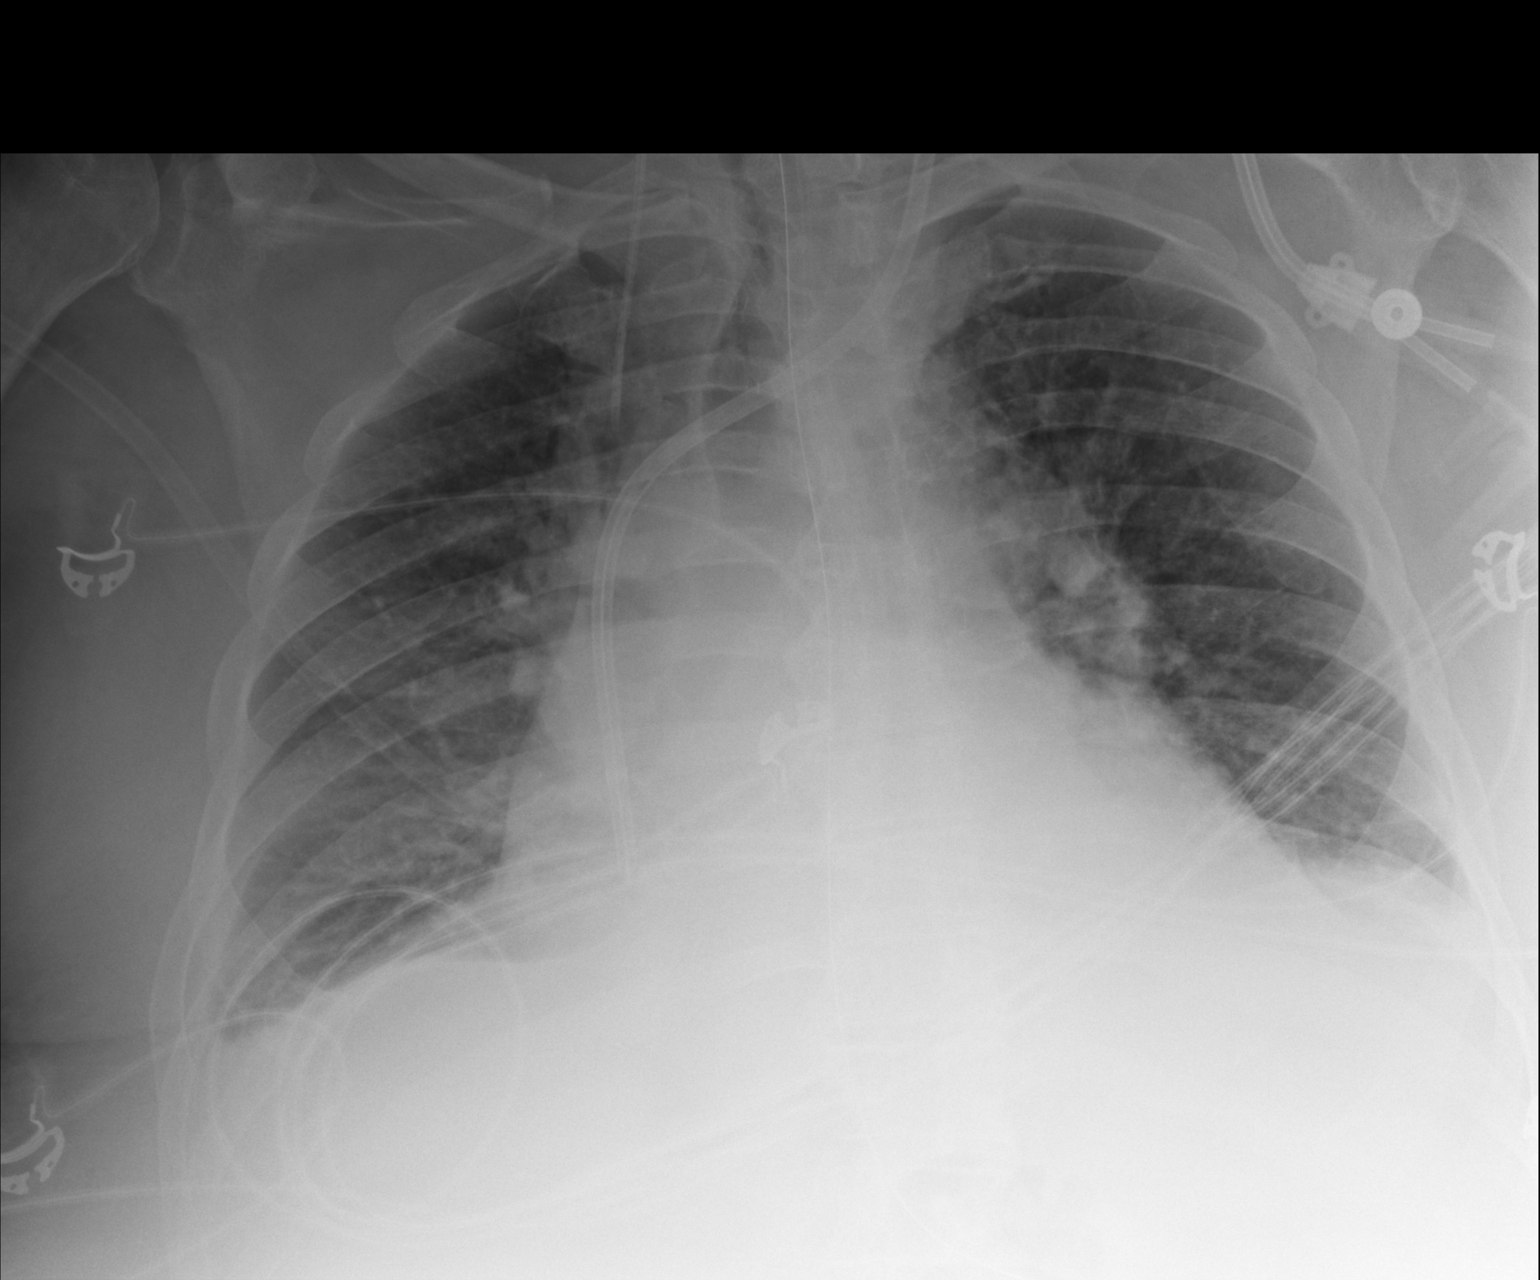

[1 of 1 positions shown; findings below may reference images not displayed]

FINDINGS: Right IJ line, left IJ dual-lumen catheter, NG tube in stable
position. Persistent cardiomegaly with persistent mild interstitial
prominence and bilateral effusions. Findings consistent with
congestive heart failure. No interim change. No pneumothorax.
IMPRESSION: 1. Lines and tubes in stable position.
2. Persistent cardiomegaly with bilateral from interstitial
prominence and bilateral effusions, no interim change. Findings
consistent with mild congestive heart failure.

## 2016-05-11 IMAGING — CR DG CHEST 1V PORT
1 series · 1 of 1 positions shown · non-contrast
Comparison: 01/20/2016.

CLINICAL DATA: Acute respiratory failure.  Ex-smoker.

EXAM:
PORTABLE CHEST 1 VIEW

[AP]
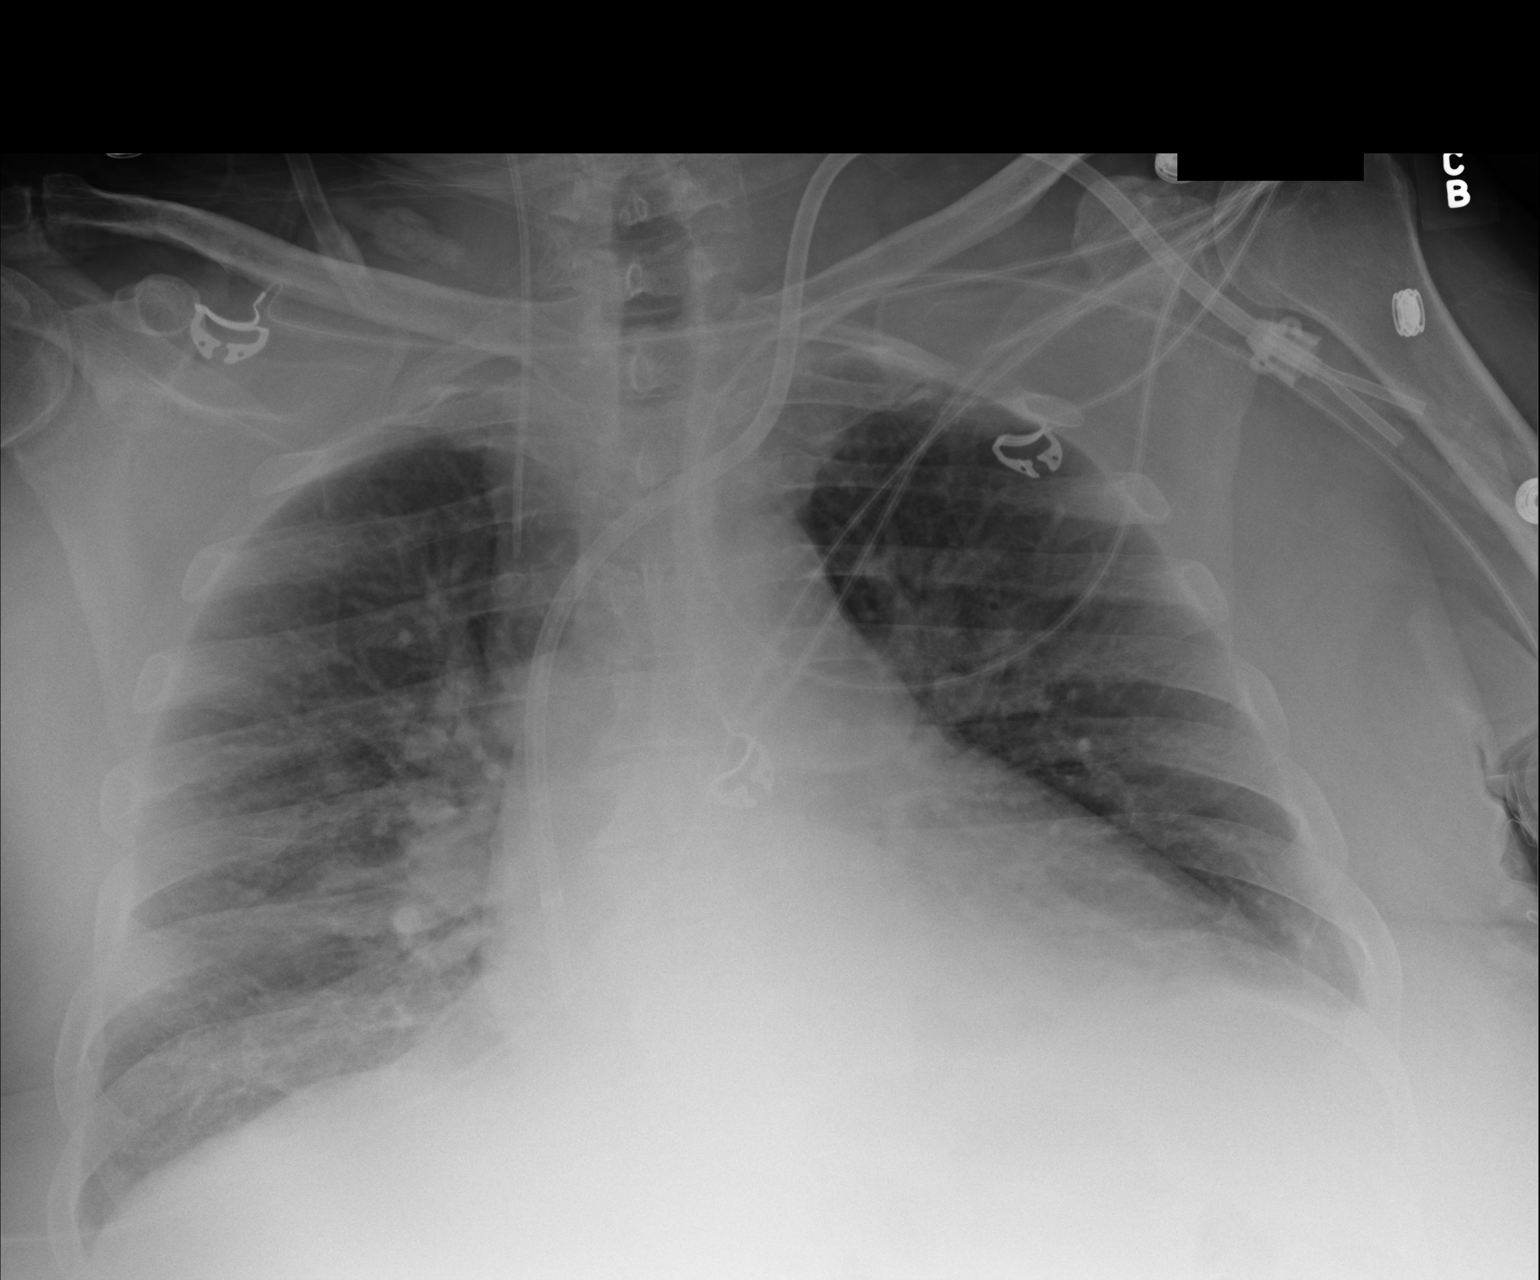

[1 of 1 positions shown; findings below may reference images not displayed]

FINDINGS: The left jugular double-lumen catheter tips are in the right atrium.
The right jugular catheter tip is in the right innominate vein. The
nasogastric tube has been removed.

Stable enlarged cardiac silhouette. Increased prominence of the
pulmonary vasculature. Stable mild prominence of the interstitial
markings. No pleural fluid. Thoracic spine degenerative changes.
IMPRESSION: 1. Mildly progressive pulmonary vascular congestion with stable
cardiomegaly.
2. Stable mild chronic interstitial lung disease.

## 2017-02-28 IMAGING — US US RENAL
1 series · 14 of 25 positions shown · non-contrast
Comparison: CT abdomen and pelvis December 01, 2014

CLINICAL DATA: Chronic renal disease

EXAM:
RENAL / URINARY TRACT ULTRASOUND COMPLETE

[Series 1: us renal · 0.33mm/px · 14 of 42 slices shown]
[im 1/42]
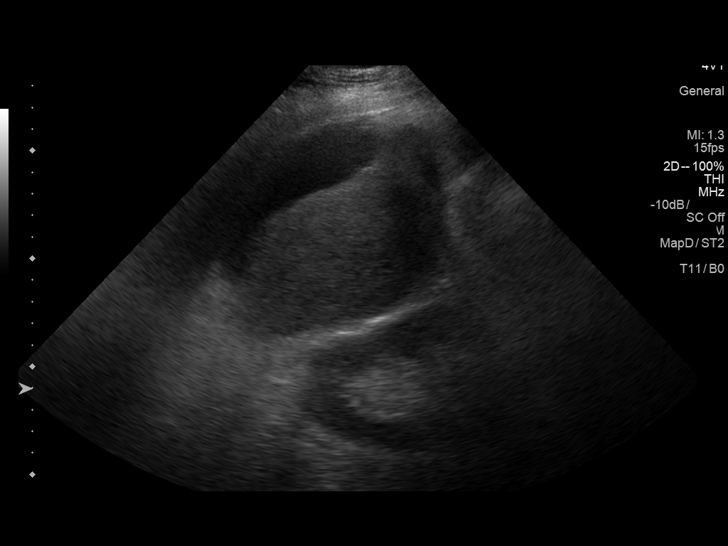
[im 4/42]
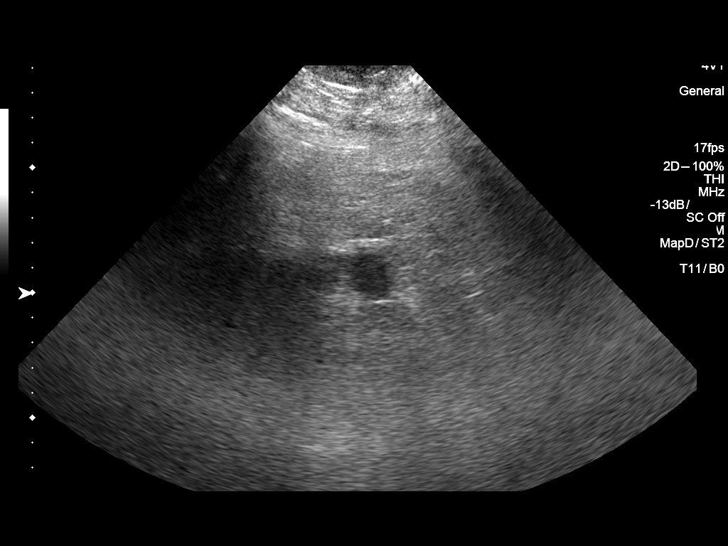
[im 7/42]
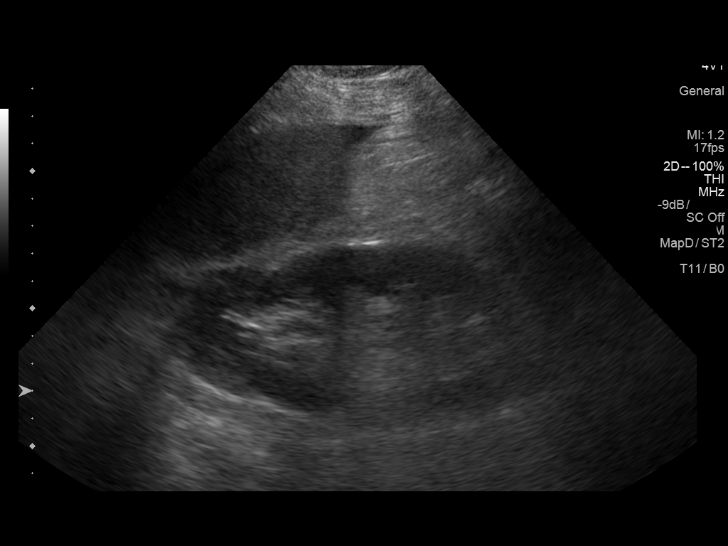
[im 11/42]
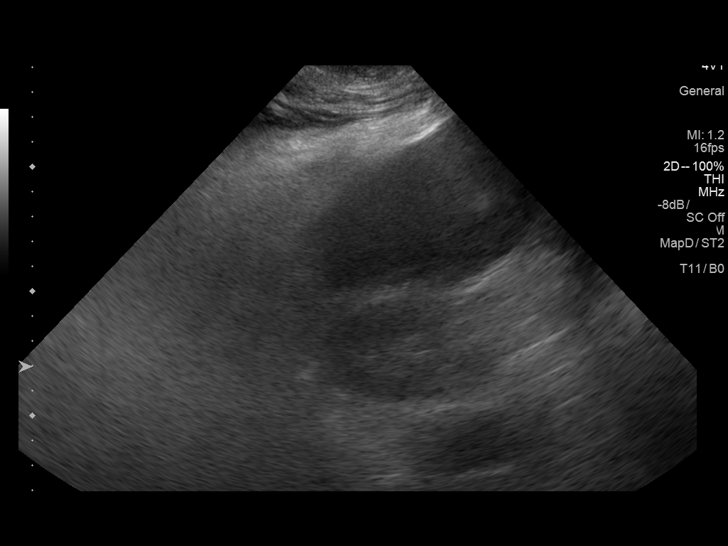
[im 14/42]
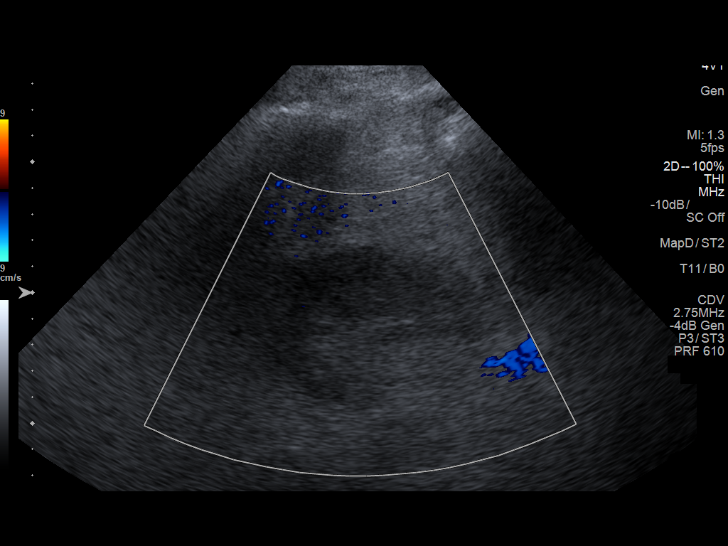
[im 16/42]
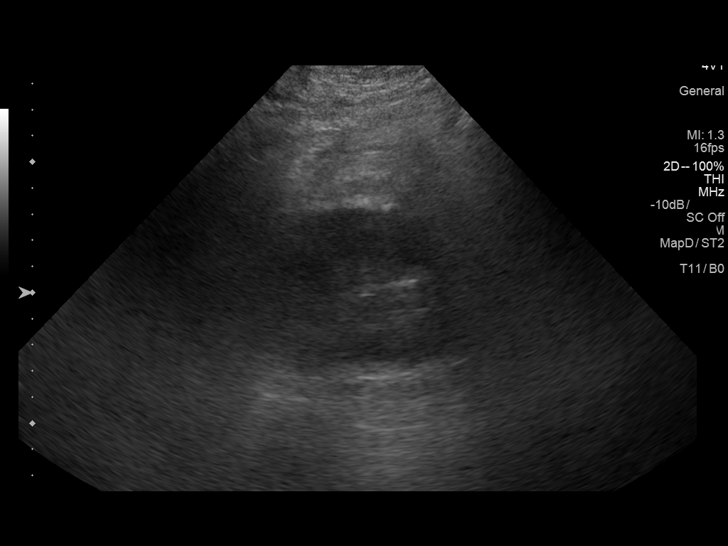
[im 19/42]
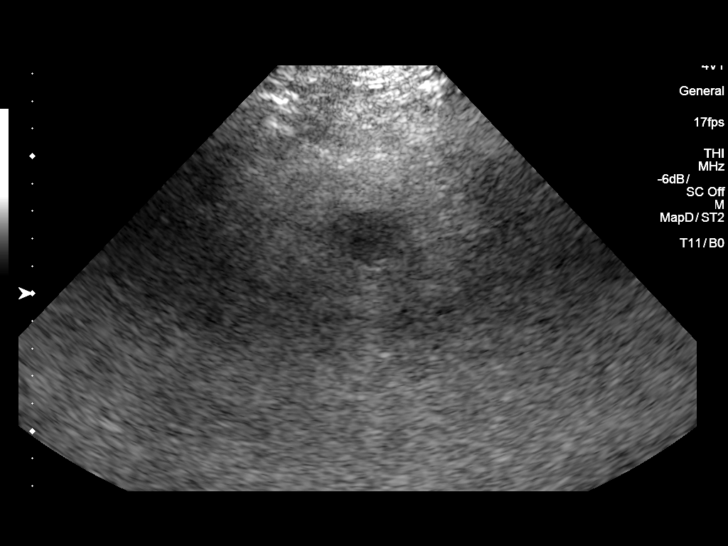
[im 23/42]
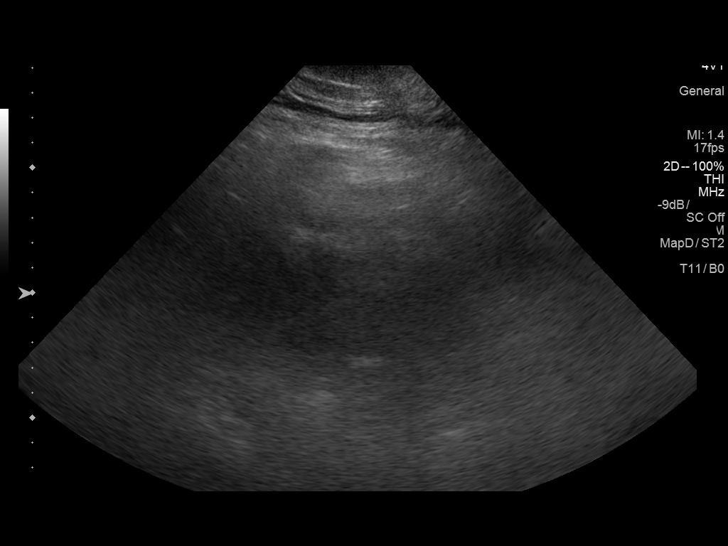
[im 26/42]
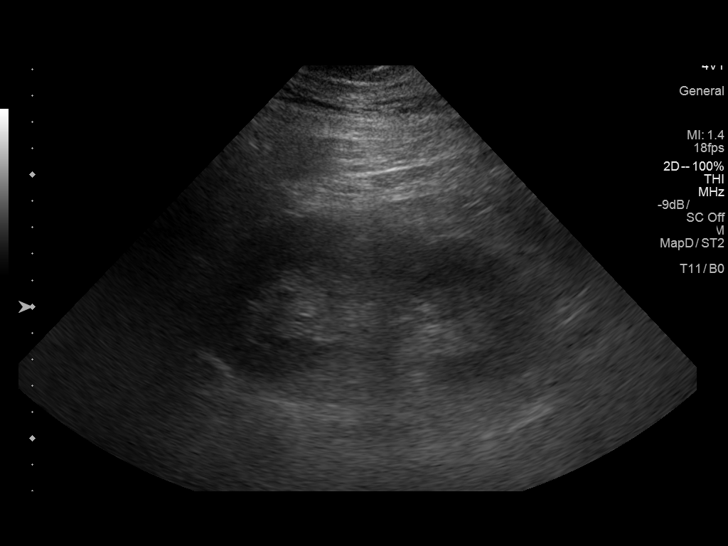
[im 28/42]
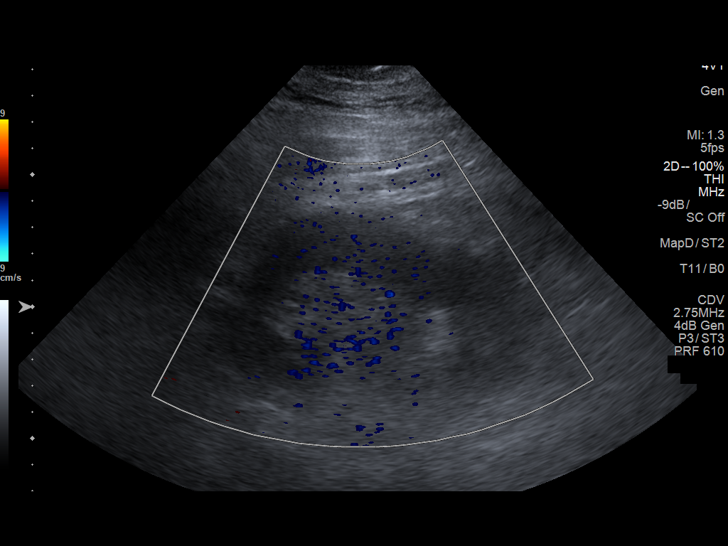
[im 31/42]
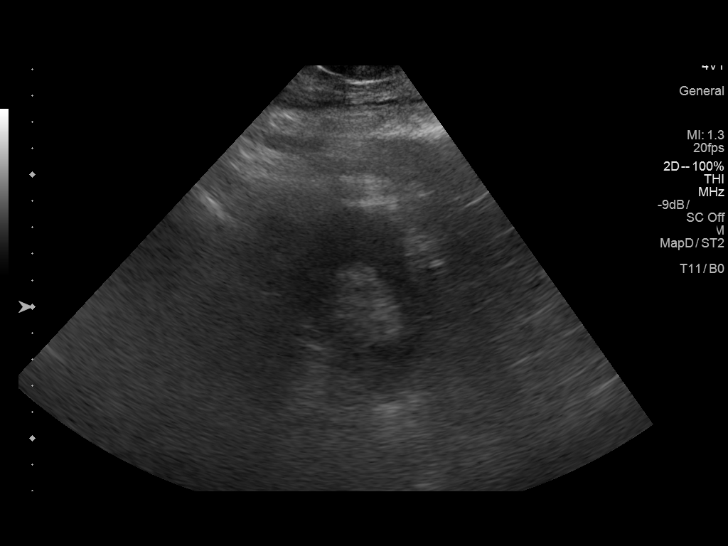
[im 35/42]
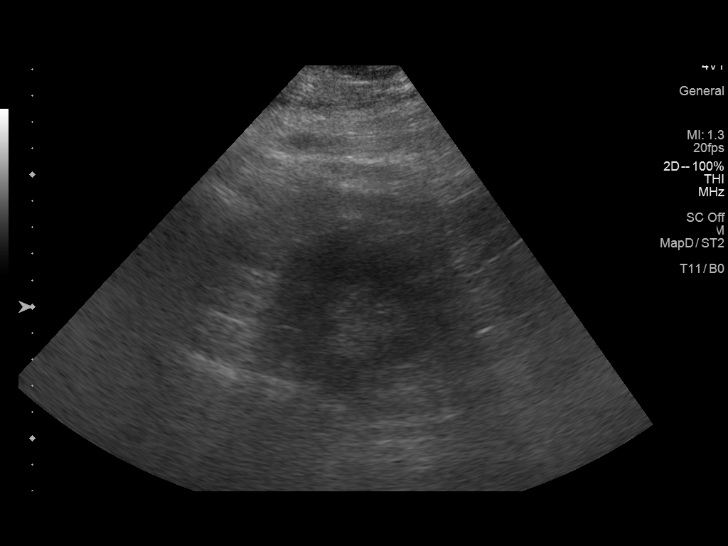
[im 38/42]
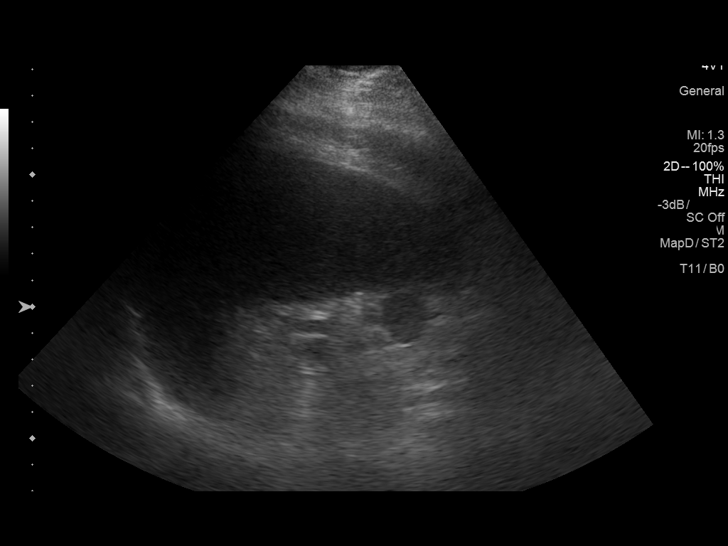
[im 42/42]
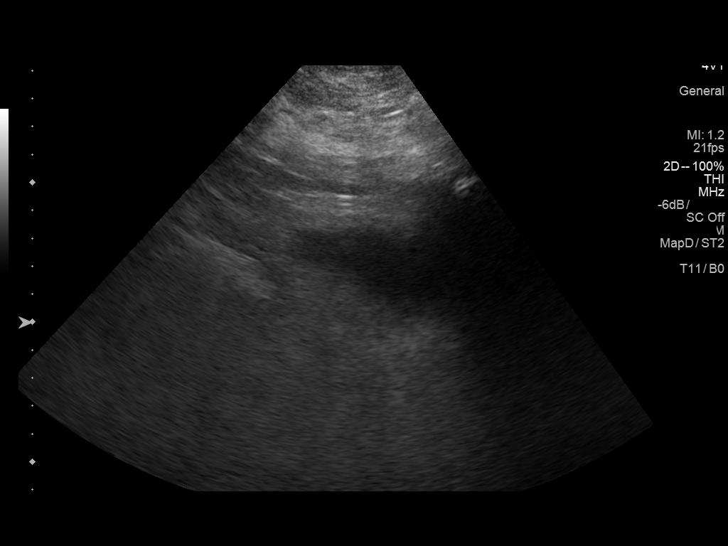

[14 of 25 positions shown; findings below may reference images not displayed]

FINDINGS: Right Kidney:

Length: 13.9 cm. Echogenicity and renal cortical thickness are
within normal limits. No perinephric fluid or hydronephrosis
visualized. There is a cyst arising from the lower pole of the right
kidney measuring 1.8 x 1.9 x 1.9 cm. No sonographically demonstrable
calculus or ureterectasis.

Left Kidney:

Length: 12.8 cm. Echogenicity and renal cortical thickness are
within normal limits. No mass, perinephric fluid, or hydronephrosis
visualized. No sonographically demonstrable calculus or
ureterectasis.

Bladder:

Appears normal for degree of bladder distention.

Incidental note is made of a small splenule adjacent to the spleen.
A small amount of ascites is noted near the liver edge.
IMPRESSION: Small right renal cyst.  Kidneys otherwise appear unremarkable.

Small amount of ascites near the liver edge.

Small accessory spleen noted incidentally.
